# Patient Record
Sex: Female | Born: 1940 | Race: Black or African American | Hispanic: No | Smoking: Never smoker
Health system: Southern US, Community
[De-identification: ages and names within clinical notes are randomized; demographics above are authoritative.]

## PROBLEM LIST (undated history)

## (undated) DIAGNOSIS — E611 Iron deficiency: Secondary | ICD-10-CM

## (undated) DIAGNOSIS — D649 Anemia, unspecified: Secondary | ICD-10-CM

## (undated) DIAGNOSIS — K8020 Calculus of gallbladder without cholecystitis without obstruction: Secondary | ICD-10-CM

## (undated) DIAGNOSIS — I10 Essential (primary) hypertension: Secondary | ICD-10-CM

## (undated) DIAGNOSIS — N390 Urinary tract infection, site not specified: Secondary | ICD-10-CM

## (undated) DIAGNOSIS — I878 Other specified disorders of veins: Secondary | ICD-10-CM

## (undated) DIAGNOSIS — E785 Hyperlipidemia, unspecified: Secondary | ICD-10-CM

## (undated) DIAGNOSIS — I2510 Atherosclerotic heart disease of native coronary artery without angina pectoris: Secondary | ICD-10-CM

## (undated) DIAGNOSIS — N182 Chronic kidney disease, stage 2 (mild): Secondary | ICD-10-CM

## (undated) DIAGNOSIS — Z87442 Personal history of urinary calculi: Secondary | ICD-10-CM

## (undated) DIAGNOSIS — M7500 Adhesive capsulitis of unspecified shoulder: Secondary | ICD-10-CM

## (undated) DIAGNOSIS — M797 Fibromyalgia: Secondary | ICD-10-CM

## (undated) DIAGNOSIS — M1990 Unspecified osteoarthritis, unspecified site: Secondary | ICD-10-CM

## (undated) DIAGNOSIS — D696 Thrombocytopenia, unspecified: Secondary | ICD-10-CM

## (undated) DIAGNOSIS — L97921 Non-pressure chronic ulcer of unspecified part of left lower leg limited to breakdown of skin: Secondary | ICD-10-CM

## (undated) HISTORY — DX: Thrombocytopenia, unspecified: D69.6

## (undated) HISTORY — DX: Other specified disorders of veins: I87.8

## (undated) HISTORY — DX: Personal history of urinary calculi: Z87.442

## (undated) HISTORY — PX: OTHER SURGICAL HISTORY: SHX169

## (undated) HISTORY — DX: Anemia, unspecified: D64.9

## (undated) HISTORY — DX: Iron deficiency: E61.1

## (undated) HISTORY — PX: LEG SURGERY: SHX1003

## (undated) HISTORY — DX: Calculus of gallbladder without cholecystitis without obstruction: K80.20

## (undated) HISTORY — DX: Atherosclerotic heart disease of native coronary artery without angina pectoris: I25.10

## (undated) HISTORY — DX: Non-pressure chronic ulcer of unspecified part of left lower leg limited to breakdown of skin: L97.921

## (undated) HISTORY — DX: Urinary tract infection, site not specified: N39.0

## (undated) HISTORY — DX: Hyperlipidemia, unspecified: E78.5

## (undated) HISTORY — DX: Fibromyalgia: M79.7

## (undated) HISTORY — PX: CARDIAC CATHETERIZATION: SHX172

## (undated) HISTORY — DX: Essential (primary) hypertension: I10

## (undated) HISTORY — DX: Unspecified osteoarthritis, unspecified site: M19.90

## (undated) HISTORY — PX: CERVICAL SPINE SURGERY: SHX589

## (undated) HISTORY — DX: Adhesive capsulitis of unspecified shoulder: M75.00

## (undated) HISTORY — PX: KIDNEY STONE SURGERY: SHX686

## (undated) HISTORY — DX: Chronic kidney disease, stage 2 (mild): N18.2

---

## 2005-05-29 ENCOUNTER — Ambulatory Visit: Payer: Self-pay | Admitting: Internal Medicine

## 2005-12-27 ENCOUNTER — Encounter: Payer: Self-pay | Admitting: Cardiovascular Disease

## 2006-08-27 ENCOUNTER — Ambulatory Visit: Payer: Self-pay | Admitting: Family Medicine

## 2007-02-07 ENCOUNTER — Encounter: Payer: Self-pay | Admitting: Cardiovascular Disease

## 2007-03-03 ENCOUNTER — Ambulatory Visit (HOSPITAL_COMMUNITY): Admission: RE | Admit: 2007-03-03 | Discharge: 2007-03-03 | Payer: Self-pay | Admitting: *Deleted

## 2008-02-03 ENCOUNTER — Ambulatory Visit: Payer: Self-pay | Admitting: Specialist

## 2008-12-28 ENCOUNTER — Encounter: Payer: Self-pay | Admitting: Cardiovascular Disease

## 2009-05-17 ENCOUNTER — Ambulatory Visit: Payer: Self-pay | Admitting: Cardiovascular Disease

## 2009-05-17 DIAGNOSIS — E785 Hyperlipidemia, unspecified: Secondary | ICD-10-CM | POA: Insufficient documentation

## 2009-05-17 DIAGNOSIS — I10 Essential (primary) hypertension: Secondary | ICD-10-CM | POA: Insufficient documentation

## 2009-05-17 DIAGNOSIS — I2510 Atherosclerotic heart disease of native coronary artery without angina pectoris: Secondary | ICD-10-CM | POA: Insufficient documentation

## 2009-05-17 DIAGNOSIS — R0989 Other specified symptoms and signs involving the circulatory and respiratory systems: Secondary | ICD-10-CM | POA: Insufficient documentation

## 2009-05-17 DIAGNOSIS — Z8679 Personal history of other diseases of the circulatory system: Secondary | ICD-10-CM | POA: Insufficient documentation

## 2009-07-12 ENCOUNTER — Encounter: Payer: Self-pay | Admitting: Cardiovascular Disease

## 2009-07-12 ENCOUNTER — Ambulatory Visit: Payer: Self-pay

## 2009-07-12 ENCOUNTER — Ambulatory Visit: Payer: Self-pay | Admitting: Internal Medicine

## 2009-07-12 LAB — CONVERTED CEMR LAB
ALT: 6 units/L
AST: 18 units/L
Albumin: 4.1 g/dL
Alkaline Phosphatase: 130 units/L
BUN: 21 mg/dL
CO2: 25 meq/L
Calcium: 9.3 mg/dL
Chloride: 100 meq/L
Cholesterol: 167 mg/dL
Creatinine, Ser: 1.19 mg/dL
GFR calc Af Amer: 54 mL/min
GFR calc non Af Amer: 47 mL/min
Glucose, Bld: 92 mg/dL
HDL: 69 mg/dL
LDL (calc): 85 mg/dL
Potassium: 3.9 meq/L
Sodium: 137 meq/L
Total Bilirubin: 0.4 mg/dL
Total Protein: 7.6 g/dL
Triglyceride fasting, serum: 65 mg/dL

## 2009-07-18 ENCOUNTER — Encounter: Payer: Self-pay | Admitting: Cardiovascular Disease

## 2009-10-28 ENCOUNTER — Ambulatory Visit: Payer: Self-pay | Admitting: Cardiovascular Disease

## 2009-11-15 ENCOUNTER — Ambulatory Visit: Payer: Self-pay | Admitting: Cardiovascular Disease

## 2009-11-21 LAB — CONVERTED CEMR LAB
ALT: 8 units/L (ref 0–35)
AST: 15 units/L (ref 0–37)
Albumin: 3.9 g/dL (ref 3.5–5.2)
Alkaline Phosphatase: 113 units/L (ref 39–117)
Bilirubin, Direct: 0.1 mg/dL (ref 0.0–0.3)
Cholesterol: 184 mg/dL (ref 0–200)
HDL: 62 mg/dL (ref >39)
Indirect Bilirubin: 0.4 mg/dL (ref 0.0–0.9)
LDL Cholesterol: 110 mg/dL — ABNORMAL HIGH (ref 0–99)
Total Bilirubin: 0.5 mg/dL (ref 0.3–1.2)
Total CHOL/HDL Ratio: 3
Total Protein: 7.2 g/dL (ref 6.0–8.3)
Triglycerides: 62 mg/dL (ref <150)
VLDL: 12 mg/dL (ref 0–40)

## 2009-12-10 ENCOUNTER — Emergency Department: Payer: Medicare Other | Admitting: Internal Medicine

## 2010-02-01 ENCOUNTER — Ambulatory Visit: Payer: Medicare Other | Admitting: Family Medicine

## 2010-02-21 ENCOUNTER — Ambulatory Visit: Payer: Medicare Other | Admitting: Family Medicine

## 2010-04-03 ENCOUNTER — Ambulatory Visit (HOSPITAL_COMMUNITY)
Admission: RE | Admit: 2010-04-03 | Discharge: 2010-04-03 | Payer: Self-pay | Source: Home / Self Care | Attending: Family Medicine | Admitting: Family Medicine

## 2010-04-10 LAB — CREATININE, SERUM
Creatinine, Ser: 1.28 mg/dL — ABNORMAL HIGH (ref 0.4–1.2)
GFR calc Af Amer: 50 mL/min — ABNORMAL LOW (ref >60)
GFR calc non Af Amer: 41 mL/min — ABNORMAL LOW (ref >60)

## 2010-04-25 NOTE — Assessment & Plan Note (Signed)
Summary: F6M/AMD   Visit Type:  Follow-up Referring Provider:  Mariah Milling Primary Provider:  Toy Cookey, MD  CC:  "Doing well"..  History of Present Illness: patient is a very pleasant 70 year old woman with history of coronary artery disease, stent placed to her mid LAD with repeat stenting for in-stent restenosis, hyperlipidemia, hypertension, fibromyalgia and diffuse muscle aches and arthritis Who presents for routine followup.  overall she has been doing very well. She is active, has no complaints of chest pain or shortness of breath. She is taking Crestor daily with no significant muscle ache. She tries to walk on a daily basis. she reports her blood pressure has been stable on losartan HCT.  Current Medications (verified): 1)  Losartan Potassium-Hctz 100-25 Mg Tabs (Losartan Potassium-Hctz) .Marland Kitchen.. 1 Tab By Mouth Daily 2)  Crestor 10 Mg Tabs (Rosuvastatin Calcium) .Marland Kitchen.. 1 At Bedtime 3)  Aspirin 81 Mg Tbec (Aspirin) .... Take 2 Tablets By Mouth Daily 4)  Ambien 10 Mg Tabs (Zolpidem Tartrate) .Marland Kitchen.. 1 By Mouth Once Daily  Allergies (verified): No Known Drug Allergies  Past History:  Past Medical History: Last updated: Jun 13, 2009 CAD Hypertension Hypelipidemia Arthritis Stents placed in mid LAD restented in-stent restensis  Past Surgical History: Last updated: 06/13/2009 Frozen shoulder Kidney stones bladder stones stents  Family History: Last updated: 06-13-09 Father: deceased 58: MI Mother: deceased 68: MI, HTN Sister: deceased 36: Trauma to head Brother: living: CABG, kidney problems, DM Son: deceased 7: burns  Social History: Last updated: 06-13-09 Retired  Married  Tobacco Use - No.  Alcohol Use - no Regular Exercise - yes - occassional Drug Use - no  Risk Factors: Alcohol Use: 0 (06-13-09) Caffeine Use: 4 cups a day (06/13/2009) Exercise: yes (2009-06-13)  Risk Factors: Smoking Status: never (2009/06/13)  Review of Systems  The patient denies  fever, weight loss, weight gain, vision loss, decreased hearing, hoarseness, chest pain, syncope, dyspnea on exertion, peripheral edema, prolonged cough, abdominal pain, incontinence, muscle weakness, depression, and enlarged lymph nodes.    Vital Signs:  Patient profile:   70 year old female Height:      67 inches Weight:      201 pounds BMI:     31.59 Pulse rate:   58 / minute BP sitting:   116 / 75  (left arm) Cuff size:   large  Vitals Entered By: Bishop Dublin, CMA (October 28, 2009 10:33 AM)  Physical Exam  General:  well-appearing woman in no apparent distress, alert and oriented x3, H. EENT exam is benign, neck is supple with no JVP, 2+ carotid bruit on the right, heart sounds are regular with normal S1-S2 and no murmurs appreciated, lungs are clear to auscultation with no wheezes rales, or abdominal exam is benign, she has no significant lower extremity edema, neurologic exam is grossly nonfocal, skin is warm and dry. Pulses are equal and symmetrical in her upper and lower extremities.   Impression & Recommendations:  Problem # 1:  CAD (ICD-414.00) stent placement in her LAD with repeat stent placement for in-stent restenosis. No recurrent symptoms of shortness of breath. no testing indicated at this time as she is asymptomatic.  Her updated medication list for this problem includes:    Aspirin 81 Mg Tbec (Aspirin) .Marland Kitchen... Take 2 tablets by mouth daily  Orders: EKG w/ Interpretation (93000) T-Lipid Profile ([id number]) T-Hepatic Function ([id number])  Problem # 2:  HYPERLIPIDEMIA (ICD-272.4) We will check her lipid panel as this has not been known for some time on her  current dose of Crestor. Goal LDL is less than 70.  Her updated medication list for this problem includes:    Crestor 10 Mg Tabs (Rosuvastatin calcium) .Marland Kitchen... 1 at bedtime  Orders: T-Lipid Profile [phone]) T-Hepatic Function [phone])  Problem # 3:  ESSENTIAL HYPERTENSION, BENIGN  (ICD-401.1) Blood pressure is well-controlled on her current medication regimen. We have made no changes at this time.  Her updated medication list for this problem includes:    Losartan Potassium-hctz 100-25 Mg Tabs (Losartan potassium-hctz) .Marland Kitchen... 1 tab by mouth daily    Aspirin 81 Mg Tbec (Aspirin) .Marland Kitchen... Take 2 tablets by mouth daily  Appended Document: F6M/AMD She does have moderate to severe bilateral carotid arterial disease. We will continue to be aggressive with her lipid management.

## 2010-04-25 NOTE — Assessment & Plan Note (Signed)
Summary: NP6/AMD   Visit Type:  New Pateint Referring Provider:  Mariah Milling Primary Provider:  Toy Cookey, MD  CC:  no cp, sob with exertion, and edema in ankles and feet.  History of Present Illness: patient is a very pleasant 70 year old woman with history of coronary artery disease, stent placed to her mid LAD with repeat stenting for in-stent restenosis, hyperlipidemia, hypertension, fibromyalgia and diffuse muscle aches and arthritis who presents to reestablish care.the details of her cardiac catheterizations are unavailable to [place] at this time.  She states that she has been doing well, active. She does have some muscle ache in her neck sometimes when she leans back in her chair at nighttime. She has been taking Crestor 20 mg on an as needed basis of those she feels that the 20 mg hurts her legs. She's not been doing any exercise routine but states that she does all her ADLs and has no problems with shortness of breath or chest pain. She's also been cutting her Diovan HCT in half as she feels it causes leg swelling.  Preventive Screening-Counseling & Management  Alcohol-Tobacco     Alcohol drinks/day: 0     Smoking Status: never  Caffeine-Diet-Exercise     Caffeine use/day: 4 cups a day     Does Patient Exercise: yes      Drug Use:  no.    Current Problems (verified): 1)  Hyperlipidemia  (ICD-272.4) 2)  Essential Hypertension, Benign  (ICD-401.1) 3)  Cad  (ICD-414.00)  Current Medications (verified): 1)  Diovan Hct 320-25 Mg Tabs (Valsartan-Hydrochlorothiazide) .Marland Kitchen.. 1 By Mouth Once Daily 2)  Crestor 20 Mg Tabs (Rosuvastatin Calcium) .... Take One Tablet By Mouth Every Other Day - Maybe 3)  Aspirin 81 Mg Tbec (Aspirin) .... Take One Tablet By Mouth Daily 4)  Ambien 10 Mg Tabs (Zolpidem Tartrate) .Marland Kitchen.. 1 By Mouth Once Daily  Allergies (verified): No Known Drug Allergies  Past History:  Past Medical History: CAD Hypertension Hypelipidemia Arthritis Stents placed in mid  LAD restented in-stent restensis  Past Surgical History: Frozen shoulder Kidney stones bladder stones stents  Family History: Father: deceased 45: MI Mother: deceased 80: MI, HTN Sister: deceased 42: Trauma to head Brother: living: CABG, kidney problems, DM Son: deceased 23: burns  Social History: Retired  Married  Tobacco Use - No.  Alcohol Use - no Regular Exercise - yes - occassional Drug Use - no Alcohol drinks/day:  0 Smoking Status:  never Caffeine use/day:  4 cups a day Does Patient Exercise:  yes Drug Use:  no  Review of Systems  The patient denies anorexia, fever, weight loss, weight gain, vision loss, decreased hearing, hoarseness, chest pain, syncope, dyspnea on exertion, peripheral edema, prolonged cough, headaches, hemoptysis, abdominal pain, melena, hematochezia, severe indigestion/heartburn, hematuria, incontinence, genital sores, muscle weakness, suspicious skin lesions, transient blindness, difficulty walking, depression, unusual weight change, abnormal bleeding, enlarged lymph nodes, angioedema, breast masses, and testicular masses.         Neck discomfort  Vital Signs:  Patient profile:   70 year old female Height:      67 inches Weight:      208 pounds BMI:     32.70 Pulse rate:   71 / minute Pulse rhythm:   regular BP sitting:   140 / 64  (left arm) Cuff size:   large  Vitals Entered By: Mercer Pod (May 17, 2009 10:22 AM)  Physical Exam  General:  well-appearing woman in no apparent distress, alert and oriented x3,  H. EENT exam is benign, neck is supple with no JVP, 2+ carotid bruit on the right, heart sounds are regular with normal S1-S2 and no murmurs appreciated, lungs are clear to auscultation with no wheezes rales, or abdominal exam is benign, she has no significant lower extremity edema, neurologic exam is grossly nonfocal, skin is warm and dry. Pulses are equal and symmetrical in her upper and lower  extremities.   EKG  Procedure date:  05/17/2009  Findings:      Heart rate is 71 beats per minute, normal sinus rhythm with frequent APCs.  Impression & Recommendations:  Problem # 1:  CAD (ICD-414.00) history of coronary disease and stenting to her mid LAD with repeat stenting for in-stent restenosis. We'll try to obtain her catheterization reportsfor our records. We'll try to obtain her most recent cholesterol panel. She states having a stress test in 2009. We'll try to obtain this prior record. we have suggested that she increase her aspirin to 81 mg x2 daily. Her updated medication list for this problem includes:    Aspirin 81 Mg Tbec (Aspirin) .Marland Kitchen... Take 2 tablets by mouth daily  Problem # 2:  HYPERLIPIDEMIA (ICD-272.4) She is taking her Crestor intermittently we have suggested that she take it on a daily basis. She will try to decrease the dose we have suggested she take 10 mg daily and we'll recheck her cholesterol in 2-3 months. Her updated medication list for this problem includes:    Crestor 20 Mg Tabs (Rosuvastatin calcium) .Marland Kitchen... Take 1/2 tablet by mouth every day  Problem # 3:  ESSENTIAL HYPERTENSION, BENIGN (ICD-401.1) blood pressure is borderline elevated today. She states taking only half of her Diovan HCT pill. Due to cost, we will change her to losartan HCT 100/25 mg daily and have suggested she take a whole tablet. Her updated medication list for this problem includes:    Losartan Potassium-hctz 100-25 Mg Tabs (Losartan potassium-hctz) .Marland Kitchen... 1 tab by mouth daily    Aspirin 81 Mg Tbec (Aspirin) .Marland Kitchen... 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 We have ordered a carotid ultrasound for her to do at her convenience to reevaluate her stenosis. There is clearly a bruit/murmur heard on exam.  Her  updated medication list for this problem includes:    Aspirin 81 Mg Tbec (Aspirin) .Marland Kitchen... Take 2 tablets by mouth daily   Patient Instructions: 1)  Your physician recommends that you return for a FASTING lipid profile: in 2-3 months (cholesterol, lfts) 2)  Your physician has recommended you make the following change in your medication: increase aspirin to 2-81 mg daily, crestor 10 mg daily, change diovan-hct to losartan-hct 100-25mg  daily 3)  Your physician has requested that you have a carotid duplex. This test is an ultrasound of the carotid arteries in your neck. It looks at blood flow through these arteries that supply the brain with blood. Allow one hour for this exam. There are no restrictions or special instructions. Prescriptions: LOSARTAN POTASSIUM-HCTZ 100-25 MG TABS (LOSARTAN POTASSIUM-HCTZ) 1 tab by mouth daily  #30 x 6   Entered by:   Charlena Cross, RN, BSN   Authorized by:   Dossie Arbour MD   Signed by:   Charlena Cross, RN, BSN on 05/17/2009   Method used:   Electronically to        [facility] [phone]* (retail)       [address] [place], [place]  [address]       Ph: [phone]       Fax: [phone]   RxID:   [id number]

## 2010-04-25 NOTE — Letter (Signed)
Summary: Medical Record Release  Medical Record Release   Imported By: Harlon Flor 05/18/2009 10:13:47  _____________________________________________________________________  External Attachment:    Type:   Image     Comment:   External Document

## 2010-04-25 NOTE — Progress Notes (Signed)
Summary: PHI  PHI   Imported By: Harlon Flor 05/18/2009 10:14:03  _____________________________________________________________________  External Attachment:    Type:   Image     Comment:   External Document

## 2010-05-16 ENCOUNTER — Telehealth: Payer: Self-pay | Admitting: Cardiovascular Disease

## 2010-05-23 NOTE — Progress Notes (Signed)
Summary: RX  Phone Note Refill Request Call back at Home Phone [phone] Message from:  Patient on May 16, 2010 12:39 PM  Refills Requested: Medication #1:  LOSARTAN POTASSIUM-HCTZ 100-25 MG TABS 1 tab by mouth daily [facility] on [address]  Initial call taken by: Harlon Flor,  May 16, 2010 12:40 PM    Prescriptions: LOSARTAN POTASSIUM-HCTZ 100-25 MG TABS (LOSARTAN POTASSIUM-HCTZ) 1 tab by mouth daily  #30 x 6   Entered by:   Lysbeth Galas CMA   Authorized by:   Dossie Arbour MD   Signed by:   Lysbeth Galas CMA on 05/16/2010   Method used:   Electronically to        [facility] [phone]* (retail)       [address] [place], [place]  [address]       Ph: [phone]       Fax: [phone]   RxID:   [id number]

## 2010-06-07 ENCOUNTER — Telehealth: Payer: Self-pay | Admitting: Cardiovascular Disease

## 2010-06-13 NOTE — Progress Notes (Signed)
Summary: Heart racing and choking feeling, FYI  Phone Note Call from Patient Call back at Home Phone [phone]   Caller: Self Call For: Jessel Gettinger Summary of Call: Pt is feeling like she is choking and heart races at night.  I scheduled her to be seen 3/28.  This was our next available appt and the pt was not satisfied with this.  She mentioned that she could see her PCD or go to the ED if it gets worse.  I stated that would be a good plan.  She then stated that she thinks she should not have to wait 2 weeks to see her cardiologist.  She noted that she discussed these problems in the ED with [facility] and attributed them to her brother being in Hospice. Initial call taken by: Harlon Flor,  June 07, 2010 8:54 AM  Follow-up for Phone Call        Spoke to pt, she states that she has been having these symptoms (palpitations/ choking feeling at night) for about 1 week. Pt told me that she does believe it is stress, related to her brother being in hospice and "waiting on a call any minute about him passing." Pt denies any SOB. She also denies having any problems with tachycardia/ arrhythmias in the past. Pt does not have a BP cuff at home to checek HR and BP. Advised pt that I think she should keep appt with Dr. Mariah Milling 3/28, however, I do think she should see her PMD to r/o any other causes of symptoms, possibly anxiety related. Pt agrees to this, and she will contact our office if symptoms worsen or any changes occur in the meantime. Follow-up by: Lanny Hurst RN,  June 07, 2010 11:28 AM

## 2010-06-21 ENCOUNTER — Ambulatory Visit: Payer: Self-pay | Admitting: Cardiovascular Disease

## 2010-07-10 ENCOUNTER — Telehealth: Payer: Self-pay | Admitting: *Deleted

## 2010-07-10 NOTE — Telephone Encounter (Signed)
Pt came by office this AM c/o atypical chest discomfort, radiating to right shoulder area, also incr in "burping" and usually when she lies down. This has been going on for about 4 weeks. Pt also states her brother recently passed away and she has been under great amount of stress. Pt does have h/o CAD with stent in mid LAD and re-stenting after restenosis >15yrs ago in [place].  Pt denies h/o Genella Rife, she does not take any antacids or anything for stress. Pt was added on for appt with Dr. Mariah Milling for wed this week. Notified pt to contact PCP as she may need eval for stress as well, advised to try Prilosec OTC for the "burping" and "full sensation." Pt will call us in the meantime if her symptoms change or worsen or if SOB develops. Called pt to see if she wanted to go ahead and order stress test, if she thinks this is definitely not caused by any other factors. Pt states she would like to contact her PCP first, and she will let us know if she wants stress test ordered.

## 2010-07-12 ENCOUNTER — Encounter: Payer: Self-pay | Admitting: Cardiovascular Disease

## 2010-07-12 NOTE — Progress Notes (Signed)
   Patient ID: [name] Cooper, female    DOB: [birth year], 70 y.o.   MRN: [id number]  HPI    Review of Systems    Physical Exam

## 2010-07-14 ENCOUNTER — Encounter: Payer: Self-pay | Admitting: Cardiovascular Disease

## 2010-07-24 ENCOUNTER — Ambulatory Visit (INDEPENDENT_AMBULATORY_CARE_PROVIDER_SITE_OTHER): Payer: Medicare PPO | Admitting: Cardiovascular Disease

## 2010-07-24 ENCOUNTER — Encounter: Payer: Self-pay | Admitting: Cardiovascular Disease

## 2010-07-24 DIAGNOSIS — E785 Hyperlipidemia, unspecified: Secondary | ICD-10-CM

## 2010-07-24 DIAGNOSIS — I2510 Atherosclerotic heart disease of native coronary artery without angina pectoris: Secondary | ICD-10-CM

## 2010-07-24 DIAGNOSIS — Z8679 Personal history of other diseases of the circulatory system: Secondary | ICD-10-CM

## 2010-07-24 DIAGNOSIS — R079 Chest pain, unspecified: Secondary | ICD-10-CM

## 2010-07-24 DIAGNOSIS — I10 Essential (primary) hypertension: Secondary | ICD-10-CM

## 2010-07-24 LAB — LIPID PANEL
Cholesterol: 209 mg/dL — ABNORMAL HIGH (ref 0–200)
HDL: 57 mg/dL (ref >39)
LDL Cholesterol: 134 mg/dL — ABNORMAL HIGH (ref 0–99)
Total CHOL/HDL Ratio: 3.7 Ratio
Triglycerides: 90 mg/dL (ref <150)
VLDL: 18 mg/dL (ref 0–40)

## 2010-07-24 NOTE — Assessment & Plan Note (Signed)
Chest pain is somewhat atypical. She does have paravertebral muscle spasms Likely secondary to her underlying stress. She does have a loss of her brother, husband with dementia. I suggested she talk with Dr. Burnett Sheng to see if she would be a candidate for muscle relaxants or even low dose benzos for significant stress when she cannot sleep. We will hold off on any workup at this time. I've asked her to start exercising.

## 2010-07-24 NOTE — Patient Instructions (Addendum)
You are doing well. No medication changes were made. Please call us if you have new issues that need to be addressed before your next appt.  We will send you a letter with your lab results that were drawn today. We will call you for a follow up Appt. In 6 months  

## 2010-07-24 NOTE — Assessment & Plan Note (Signed)
Continue her current medications as she is taking.

## 2010-07-24 NOTE — Assessment & Plan Note (Signed)
Blood pressure is well controlled on today's visit. No changes made to the medications. 

## 2010-07-24 NOTE — Progress Notes (Signed)
   Patient ID: [name] Cooper, female    DOB: [birth year], 70 y.o.   MRN: [id number]  HPI Comments: [name] Cooper is a very pleasant 69 year old woman with history of coronary artery disease, stent placed to her mid LAD with repeat stenting for in-stent restenosis, hyperlipidemia, hypertension, fibromyalgia and diffuse muscle aches and arthritis who presents for routine followup.   She reports that she has had significant stress at home. Her brother recently died while in hospice. She spent significant time in the hospital for this. Her husband also has significant dementia and she has been having increasing stress at home taking care of him.  She reports having significant tightness across her back, the neck into the shoulders. It is very uncomfortable. She is uncertain if this is from her heart. She occasionally has some tightness in the chest though mainly in the back.  She has not been walking on a regular basis as she was in the past. She does feel somewhat trapped as she cannot leave her husband alone for long periods. She is nervous about his driving.  EKG shows normal sinus rhythm with rate 60 beats per minute with no significant ST or T wave changes      Review of Systems  Constitutional: Positive for activity change and fatigue.  HENT: Negative.   Eyes: Negative.   Respiratory: Positive for shortness of breath.   Cardiovascular: Positive for chest pain.  Gastrointestinal: Negative.   Musculoskeletal: Positive for arthralgias.       Tight muscles in the back of her neck and shoulders.  Skin: Negative.   Neurological: Negative.   Hematological: Negative.   Psychiatric/Behavioral: Negative.   All other systems reviewed and are negative.   BP 110/60  Pulse 60  Resp 16  Ht 5\' 7"  (1.702 m)  Wt 193 lb (87.544 kg)  BMI 30.23 kg/m2  Physical Exam  Nursing note and vitals reviewed. Constitutional: She is oriented to person, place, and time. She appears well-developed and  well-nourished.  HENT:  Head: Normocephalic.  Nose: Nose normal.  Mouth/Throat: Oropharynx is clear and moist.  Eyes: Conjunctivae are normal. Pupils are equal, round, and reactive to light.  Neck: Normal range of motion. Neck supple. No JVD present.  Cardiovascular: Normal rate, regular rhythm, S1 normal, S2 normal, normal heart sounds and intact distal pulses.  Exam reveals no gallop and no friction rub.   No murmur heard. Pulmonary/Chest: Effort normal and breath sounds normal. No respiratory distress. She has no wheezes. She has no rales. She exhibits no tenderness.  Abdominal: Soft. Bowel sounds are normal. She exhibits no distension. There is no tenderness.  Musculoskeletal: Normal range of motion. She exhibits no edema and no tenderness.  Lymphadenopathy:    She has no cervical adenopathy.  Neurological: She is alert and oriented to person, place, and time. Coordination normal.  Skin: Skin is warm and dry. No rash noted. No erythema.  Psychiatric: She has a normal mood and affect. Her behavior is normal. Judgment and thought content normal.         Assessment and Plan

## 2010-07-26 ENCOUNTER — Encounter: Payer: Self-pay | Admitting: Cardiovascular Disease

## 2010-08-08 NOTE — Cardiovascular Report (Signed)
NAMESHAYLA, HEMING NO.:  [id number]   MEDICAL RECORD NO.:  [id number]          PATIENT TYPE:  OIB   LOCATION:  2899                         FACILITY:  [facility]   PHYSICIAN:  Darlin Priestly, MD  DATE OF BIRTH:  08-28-1940   DATE OF PROCEDURE:  03/03/2007  DATE OF DISCHARGE:                            CARDIAC CATHETERIZATION   PROCEDURE:  1. Left heart catheterization.  2. Coronary angiography.  3. Left ventriculogram.   COMPLICATIONS:  None.   INDICATIONS:  Ms. Dandy is a 70 year old female a patient of Dr.  Toy Cookey and Dr. Kem Boroughs with a history of hypertension,  hyperlipidemia, history of known CAD, status post overlapping LAD stents  in 2000 with subsequent InStent restenosis and placement of a third non-  DES stent in the mid segment of the overlapping 2 previous LAD stents.  Since that time she has had negative Cardiolite scans and  echocardiograms.  Recently, however, she did complain of some increasing  chest pain, and underwent repeat Cardiolite scan suggesting a mild  septal ischemia.  She is now for repeat catheterization to reassess her  CAD.   DESCRIPTION OF PROCEDURE:  After patient giving informed consent, the  patient was brought to the cardiac cath lab and the right groin was  shaved, prepped and draped in the usual sterile fashion.  Usual  monitoring established.  Using modified Seldinger technique, a #6-French  arterial sheath was inserted into the right femoral artery.  A 6-French  diagnostic catheter was used to perform diagnostic angiography.   RESULTS:  1. Left main is a medium-to-large vessel with no significant disease.  2. The LAD is a medium-size vessel which courses __________ to one      diagonal branch.  There are overlapping stents noted in the      proximal portion of the LAD with diffuse 40% InStent restenosis      which appears unchanged from catheterization done in 2003.  There      is a 50% lesion in the  LAD beyond the stents at the takeoff of the      first diagonal.  3. First diagonal was a small vessel with no disease.  4. Left coronary artery also gives rise to a medium-size ramus      intermedius with 40% ostial lesion which, again, was unchanged.  5. Left circumflex is a medium-size vessel giving rise to two obtuse      marginal branches.  The AV circumflex has no significant disease.  6. First OM is a medium-size vessel which bifurcates into the      __________ segment and no significant disease.  7. Second OM is a small vessel with no significant disease.  8. The right coronary artery is a medium-size vessel which is dominant      and gives rise to with PDS of the first lateral branch.  There is      spasm noted in the proximal portion of the RCA which is relieved      with intracoronary nitro.  There is no further significant disease      in  the RCA, PDA, or posterolateral branch.   LEFT VENTRICULOGRAM:  Reveals a preserved EF of 60%.   HEMODYNAMIC SYSTEM:  Systemic arterial pressure 117/44, LV system  pressure 17/3, LVEDP of 15.   CONCLUSION:  1. Noncritical coronary artery disease involving InStent restenosis of      the left anterior descending artery which appears unchanged from      catheterization in 2003.  2. Normal left ventricular systolic function.      Darlin Priestly, MD  Electronically Signed     RHM/MEDQ  D:  03/03/2007  T:  03/03/2007  Job:  [id number]   cc:   Dr. Lalla Brothers, MD

## 2010-12-19 ENCOUNTER — Telehealth: Payer: Self-pay

## 2010-12-19 MED ORDER — LOSARTAN POTASSIUM-HCTZ 100-25 MG PO TABS: 1.0000 | ORAL_TABLET | Freq: Every day | ORAL | Status: DC

## 2010-12-19 NOTE — Telephone Encounter (Signed)
Duplicate message for losartan potassium

## 2010-12-19 NOTE — Telephone Encounter (Signed)
Refill sent for losartan-hctz 100-25 mg take one tablet daily.

## 2011-01-12 ENCOUNTER — Encounter: Payer: Self-pay | Admitting: Cardiovascular Disease

## 2011-01-22 ENCOUNTER — Encounter: Payer: Self-pay | Admitting: Cardiovascular Disease

## 2011-01-22 ENCOUNTER — Ambulatory Visit (INDEPENDENT_AMBULATORY_CARE_PROVIDER_SITE_OTHER): Payer: Medicare PPO | Admitting: Cardiovascular Disease

## 2011-01-22 DIAGNOSIS — I6529 Occlusion and stenosis of unspecified carotid artery: Secondary | ICD-10-CM

## 2011-01-22 DIAGNOSIS — I10 Essential (primary) hypertension: Secondary | ICD-10-CM

## 2011-01-22 DIAGNOSIS — Z01810 Encounter for preprocedural cardiovascular examination: Secondary | ICD-10-CM

## 2011-01-22 DIAGNOSIS — E785 Hyperlipidemia, unspecified: Secondary | ICD-10-CM

## 2011-01-22 DIAGNOSIS — I2510 Atherosclerotic heart disease of native coronary artery without angina pectoris: Secondary | ICD-10-CM

## 2011-01-22 DIAGNOSIS — R079 Chest pain, unspecified: Secondary | ICD-10-CM

## 2011-01-22 MED ORDER — NITROGLYCERIN 0.4 MG SL SUBL: 0.4000 mg | SUBLINGUAL_TABLET | SUBLINGUAL | Status: DC | PRN

## 2011-01-22 NOTE — Assessment & Plan Note (Addendum)
We will need to make some adjustments to her cholesterol medication. We will have the nurses confirmed that she is actually taking Crestor daily. In the past, she was not taking this every day. If this is the case, we could add zetia.

## 2011-01-22 NOTE — Patient Instructions (Addendum)
You are doing well. No medication changes were made.   We will schedule you for a cardiac cath this week.  Please call us if you have new issues that need to be addressed before your next appt.  The office will contact you for a follow up Appt. In 1 months  Your physician has requested that you have a cardiac catheterization. Cardiac catheterization is used to diagnose and/or treat various heart conditions. Doctors may recommend this procedure for a number of different reasons. The most common reason is to evaluate chest pain. Chest pain can be a symptom of coronary artery disease (CAD), and cardiac catheterization can show whether plaque is narrowing or blocking your heart's arteries. This procedure is also used to evaluate the valves, as well as measure the blood flow and oxygen levels in different parts of your heart. For further information please visit [email or web address] Please follow instruction sheet, as given.

## 2011-01-22 NOTE — Assessment & Plan Note (Addendum)
I'm concerned about recent worsening chest pain with exertion. She reports it is similar to previous symptoms when she had her stent stenosis. We have discussed the various options for evaluation including stress testing or cardiac catheterization. She and her daughter prefer a catheterization given that she has had problems in the past with stent and in-stent restenosis. We'll try to schedule this for a [facility] late this week.

## 2011-01-22 NOTE — Assessment & Plan Note (Signed)
Blood pressure is well controlled on today's visit. No changes made to the medications. 

## 2011-01-22 NOTE — Assessment & Plan Note (Signed)
Previous ultrasound April 2011 suggested 60-79% bilateral carotid arterial disease. We will talk to her about scheduling a repeat carotid ultrasound.

## 2011-01-22 NOTE — Progress Notes (Signed)
   Patient ID: [name] Cooper, female    DOB: 04-Aug-1940, 70 y.o.   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   She has had very stressful family events in the past as her brother has died, husband had significant dementia. She reports having significant chest tightness with exertion. It seems to come on with walking, stairs, any exertion. She is concerned it could be her heart as it feels very similar to previous symptoms. In the past, she has had back discomfort, now she reports having predominantly mediastinal and left sided chest discomfort, sometimes radiating to her left arm with exertion.   EKG shows normal sinus rhythm with rate 61 beats per minute with no significant ST or T wave changes, Unable to exclude old anteroseptal infarct    Outpatient Encounter Prescriptions as of 01/22/2011  Medication Sig Dispense Refill  . aspirin 81 MG EC tablet Take 81 mg by mouth daily.       Marland Kitchen losartan-hydrochlorothiazide (HYZAAR) 100-25 MG per tablet Take 1 tablet by mouth daily.  30 tablet  6  . rosuvastatin (CRESTOR) 20 MG tablet Take 10 mg by mouth daily.       Marland Kitchen zolpidem (AMBIEN) 10 MG tablet Take 10 mg by mouth at bedtime as needed.        . nitroGLYCERIN (NITROSTAT) 0.4 MG SL tablet Place 1 tablet (0.4 mg total) under the tongue every 5 (five) minutes as needed for chest pain.  25 tablet  3     Review of Systems  Constitutional: Positive for fatigue.  HENT: Negative.   Eyes: Negative.   Cardiovascular: Positive for chest pain.  Gastrointestinal: Negative.   Musculoskeletal: Positive for arthralgias.  Skin: Negative.   Neurological: Negative.   Hematological: Negative.   Psychiatric/Behavioral: Negative.     All other systems reviewed and are negative.   BP 124/60  Pulse 65  Ht 5\' 7"  (1.702 m)  Wt 197 lb 8 oz (89.585 kg)  BMI 30.93 kg/m2  Physical Exam  Nursing note and vitals reviewed. Constitutional: She is oriented to person, place, and time. She appears well-developed and well-nourished.  HENT:  Head: Normocephalic.  Nose: Nose normal.  Mouth/Throat: Oropharynx is clear and moist.  Eyes: Conjunctivae are normal. Pupils are equal, round, and reactive to light.  Neck: Normal range of motion. Neck supple. No JVD present.  Cardiovascular: Normal rate, regular rhythm, S1 normal, S2 normal, normal heart sounds and intact distal pulses.  Exam reveals no gallop and no friction rub.   No murmur heard. Pulmonary/Chest: Effort normal and breath sounds normal. No respiratory distress. She has no wheezes. She has no rales. She exhibits no tenderness.  Abdominal: Soft. Bowel sounds are normal. She exhibits no distension. There is no tenderness.  Musculoskeletal: Normal range of motion. She exhibits no edema and no tenderness.  Lymphadenopathy:    She has no cervical adenopathy.  Neurological: She is alert and oriented to person, place, and time. Coordination normal.  Skin: Skin is warm and dry. No rash noted. No erythema.  Psychiatric: She has a normal mood and affect. Her behavior is normal. Judgment and thought content normal.         Assessment and Plan

## 2011-01-23 ENCOUNTER — Encounter: Payer: Self-pay | Admitting: *Deleted

## 2011-01-23 ENCOUNTER — Telehealth: Payer: Self-pay | Admitting: *Deleted

## 2011-01-23 DIAGNOSIS — E876 Hypokalemia: Secondary | ICD-10-CM

## 2011-01-23 DIAGNOSIS — I6529 Occlusion and stenosis of unspecified carotid artery: Secondary | ICD-10-CM

## 2011-01-23 DIAGNOSIS — E785 Hyperlipidemia, unspecified: Secondary | ICD-10-CM

## 2011-01-23 DIAGNOSIS — I2510 Atherosclerotic heart disease of native coronary artery without angina pectoris: Secondary | ICD-10-CM

## 2011-01-23 DIAGNOSIS — R0989 Other specified symptoms and signs involving the circulatory and respiratory systems: Secondary | ICD-10-CM

## 2011-01-23 LAB — BASIC METABOLIC PANEL
BUN/Creatinine Ratio: 16 (ref 11–26)
BUN: 21 mg/dL (ref 8–27)
CO2: 23 mmol/L (ref 20–32)
Calcium: 9.3 mg/dL (ref 8.6–10.2)
Chloride: 102 mmol/L (ref 97–108)
Creatinine, Ser: 1.29 mg/dL — ABNORMAL HIGH (ref 0.57–1.00)
GFR calc Af Amer: 48 mL/min/{1.73_m2} — ABNORMAL LOW (ref >59)
GFR calc non Af Amer: 42 mL/min/{1.73_m2} — ABNORMAL LOW (ref >59)
Glucose: 100 mg/dL — ABNORMAL HIGH (ref 65–99)
Potassium: 3.4 mmol/L — ABNORMAL LOW (ref 3.5–5.2)
Sodium: 139 mmol/L (ref 134–144)

## 2011-01-23 LAB — PROTIME-INR
INR: 1.1 (ref 0.8–1.2)
Prothrombin Time: 11.3 s (ref 9.1–12.0)

## 2011-01-23 LAB — CBC WITH DIFFERENTIAL
Basophils Absolute: 0 10*3/uL (ref 0.0–0.2)
Basos: 0 % (ref 0–3)
Eos: 4 % (ref 0–7)
Eosinophils Absolute: 0.4 10*3/uL (ref 0.0–0.4)
HCT: 32.2 % — ABNORMAL LOW (ref 34.0–46.6)
Hemoglobin: 10.5 g/dL — ABNORMAL LOW (ref 11.1–15.9)
Immature Grans (Abs): 0 10*3/uL (ref 0.0–0.1)
Immature Granulocytes: 0 % (ref 0–2)
Lymphocytes Absolute: 2.5 10*3/uL (ref 0.7–4.5)
Lymphs: 26 % (ref 14–46)
MCH: 26.8 pg (ref 26.6–33.0)
MCHC: 32.6 g/dL (ref 31.5–35.7)
MCV: 82 fL (ref 79–97)
Monocytes Absolute: 0.8 10*3/uL (ref 0.1–1.0)
Monocytes: 8 % (ref 4–13)
Neutrophils Absolute: 6.2 10*3/uL (ref 1.8–7.8)
Neutrophils Relative %: 62 % (ref 40–74)
Platelets: 347 10*3/uL (ref 140–415)
RBC: 3.92 x10E6/uL (ref 3.77–5.28)
RDW: 12.6 % (ref 12.3–15.4)
WBC: 9.9 10*3/uL (ref 4.0–10.5)

## 2011-01-23 NOTE — Telephone Encounter (Signed)
Spoke to pt, gave her instructions for cath (per letter sent) this Friday 11/2, she will arrive at 0700 at [facility], and she knows to pre-register. Gave pt instructions over the phone and she knows letter will be sent as well. She verbalizes understanding of all instructions listed. Pt's labs reviewed with pt, K+ low at 3.4, she does take hctz 25, I advised her to incr K in diet daily; I reviewed many K+ rich foods for this. Pt states she has had problems with low K in past. Dr. Mariah Milling, I also asked pt if she is taking Crestor 20 daily since her lipids still very high. Pt states she had leg/muscle cramps with 20, and she has cut down to 10mg  QOD. She also says with her h/o low K, this has been the cause of cramps in the past as well. Pt has f/u with you on 11/29 post cath, do you want [place] to re-check BMP then and r/o this as the cause prior to changing meds? She says after decr to 10mg  qod that her cramps have not changed much. Do you want me to have her atleast take daily in the meantime. Please advise.  Will also schedule pt for future carotid prior to her f/u with [facility].

## 2011-01-26 ENCOUNTER — Encounter: Payer: Self-pay | Admitting: Cardiovascular Disease

## 2011-01-26 ENCOUNTER — Ambulatory Visit: Payer: Medicare Other | Admitting: Cardiovascular Disease

## 2011-01-26 DIAGNOSIS — R072 Precordial pain: Secondary | ICD-10-CM

## 2011-02-05 ENCOUNTER — Ambulatory Visit (INDEPENDENT_AMBULATORY_CARE_PROVIDER_SITE_OTHER): Payer: Medicare PPO | Admitting: *Deleted

## 2011-02-05 ENCOUNTER — Encounter: Payer: Self-pay | Admitting: Cardiovascular Disease

## 2011-02-05 ENCOUNTER — Ambulatory Visit (INDEPENDENT_AMBULATORY_CARE_PROVIDER_SITE_OTHER): Payer: Medicare PPO | Admitting: Cardiovascular Disease

## 2011-02-05 DIAGNOSIS — I6529 Occlusion and stenosis of unspecified carotid artery: Secondary | ICD-10-CM

## 2011-02-05 DIAGNOSIS — E876 Hypokalemia: Secondary | ICD-10-CM

## 2011-02-05 DIAGNOSIS — I10 Essential (primary) hypertension: Secondary | ICD-10-CM

## 2011-02-05 DIAGNOSIS — E785 Hyperlipidemia, unspecified: Secondary | ICD-10-CM

## 2011-02-05 DIAGNOSIS — R079 Chest pain, unspecified: Secondary | ICD-10-CM

## 2011-02-05 DIAGNOSIS — R0989 Other specified symptoms and signs involving the circulatory and respiratory systems: Secondary | ICD-10-CM

## 2011-02-05 DIAGNOSIS — I2510 Atherosclerotic heart disease of native coronary artery without angina pectoris: Secondary | ICD-10-CM

## 2011-02-05 NOTE — Progress Notes (Signed)
   Patient ID: [name] Cooper, female    DOB: 1940-09-19, 70 y.o.   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 Cardiac catheter at [facility] for chest discomfort showed patent LAD stent with no other significant stenoses. Medical management recommended   She has had very stressful family events in the past as her brother has died, husband had significant dementia. She reports that her chest tightness has significantly improved. She is back on Crestor 10 mg daily. She does have mild edema in her legs that is nonpitting. She does not wear TED hose. She is a nonsmoker  EKG shows normal sinus rhythm with rate 60 beats per minute with no significant ST or T wave changes, Unable to exclude old anteroseptal infarct    Outpatient Encounter Prescriptions as of 02/05/2011  Medication Sig Dispense Refill  . losartan-hydrochlorothiazide (HYZAAR) 100-25 MG per tablet Take 1 tablet by mouth daily.  30 tablet  6  . nitroGLYCERIN (NITROSTAT) 0.4 MG SL tablet Place 1 tablet (0.4 mg total) under the tongue every 5 (five) minutes as needed for chest pain.  25 tablet  3  . rosuvastatin (CRESTOR) 20 MG tablet Take 10 mg by mouth every other day. Pt takes 10mg  every other day. Prescribed is 20mg  daily.      Marland Kitchen zolpidem (AMBIEN) 10 MG tablet Take 10 mg by mouth at bedtime as needed.        Marland Kitchen  aspirin 81 MG EC tablet Take 81 mg by mouth daily.          Review of Systems  HENT: Negative.   Eyes: Negative.   Gastrointestinal: Negative.   Musculoskeletal: Positive for arthralgias.  Skin: Negative.   Neurological: Negative.   Hematological: Negative.   Psychiatric/Behavioral: Negative.   All other systems  reviewed and are negative.   BP 128/54  Pulse 60  Ht 5\' 7"  (1.702 m)  Wt 198 lb (89.812 kg)  BMI 31.01 kg/m2  Physical Exam  Nursing note and vitals reviewed. Constitutional: She is oriented to person, place, and time. She appears well-developed and well-nourished.  HENT:  Head: Normocephalic.  Nose: Nose normal.  Mouth/Throat: Oropharynx is clear and moist.  Eyes: Conjunctivae are normal. Pupils are equal, round, and reactive to light.  Neck: Normal range of motion. Neck supple. No JVD present.  Cardiovascular: Normal rate, regular rhythm, S1 normal, S2 normal, normal heart sounds and intact distal pulses.  Exam reveals no gallop and no friction rub.   No murmur heard. Pulmonary/Chest: Effort normal and breath sounds normal. No respiratory distress. She has no wheezes. She has no rales. She exhibits no tenderness.  Abdominal: Soft. Bowel sounds are normal. She exhibits no distension. There is no tenderness.  Musculoskeletal: Normal range of motion. She exhibits no edema and no tenderness.  Lymphadenopathy:    She has no cervical adenopathy.  Neurological: She is alert and oriented to person, place, and time. Coordination normal.  Skin: Skin is warm and dry. No rash noted. No erythema.  Psychiatric: She has a normal mood and affect. Her behavior is normal. Judgment and thought content normal.         Assessment and Plan

## 2011-02-05 NOTE — Patient Instructions (Addendum)
You are doing well. Please increase the aspiring to 81 mg x 2 a day  Please call us if you have new issues that need to be addressed before your next appt.  The office will contact you for a follow up Appt. In 12 months  We will contact you for a cholesterol check in three months.

## 2011-02-05 NOTE — Assessment & Plan Note (Signed)
Blood pressure is well controlled on today's visit. No changes made to the medications. 

## 2011-02-05 NOTE — Assessment & Plan Note (Signed)
60% bilateral carotid arterial disease. Repeat carotid ultrasound pending.

## 2011-02-05 NOTE — Assessment & Plan Note (Signed)
Currently with no symptoms of angina. No further workup at this time. Continue current medication regimen. Recent cardiac catheterization showing patent LAD stent.

## 2011-02-05 NOTE — Assessment & Plan Note (Signed)
I have suggested she stay on the Crestor 10 mg daily with a cholesterol check in 3 months time. Goal LDL less than 70.

## 2011-02-06 LAB — BASIC METABOLIC PANEL
BUN/Creatinine Ratio: 18 (ref 11–26)
BUN: 24 mg/dL (ref 8–27)
CO2: 22 mmol/L (ref 20–32)
Calcium: 8.9 mg/dL (ref 8.6–10.2)
Chloride: 102 mmol/L (ref 97–108)
Creatinine, Ser: 1.31 mg/dL — ABNORMAL HIGH (ref 0.57–1.00)
GFR calc Af Amer: 48 mL/min/{1.73_m2} — ABNORMAL LOW (ref >59)
GFR calc non Af Amer: 41 mL/min/{1.73_m2} — ABNORMAL LOW (ref >59)
Glucose: 91 mg/dL (ref 65–99)
Potassium: 3.8 mmol/L (ref 3.5–5.2)
Sodium: 141 mmol/L (ref 134–144)

## 2011-02-09 ENCOUNTER — Encounter: Payer: Self-pay | Admitting: Cardiovascular Disease

## 2011-02-13 ENCOUNTER — Encounter (INDEPENDENT_AMBULATORY_CARE_PROVIDER_SITE_OTHER): Payer: Medicare PPO | Admitting: *Deleted

## 2011-02-13 DIAGNOSIS — I6529 Occlusion and stenosis of unspecified carotid artery: Secondary | ICD-10-CM

## 2011-02-13 DIAGNOSIS — R0989 Other specified symptoms and signs involving the circulatory and respiratory systems: Secondary | ICD-10-CM

## 2011-02-13 DIAGNOSIS — E785 Hyperlipidemia, unspecified: Secondary | ICD-10-CM

## 2011-02-22 ENCOUNTER — Ambulatory Visit: Payer: Medicare PPO | Admitting: Cardiovascular Disease

## 2011-02-22 ENCOUNTER — Other Ambulatory Visit: Payer: Medicare PPO | Admitting: *Deleted

## 2011-04-10 ENCOUNTER — Ambulatory Visit: Payer: Self-pay | Admitting: Family Medicine

## 2011-05-29 ENCOUNTER — Other Ambulatory Visit: Payer: Medicare PPO

## 2011-05-29 DIAGNOSIS — E785 Hyperlipidemia, unspecified: Secondary | ICD-10-CM

## 2011-05-30 LAB — HEPATIC FUNCTION PANEL
ALT: 10 IU/L (ref 0–32)
AST: 16 IU/L (ref 0–40)
Albumin: 3.8 g/dL (ref 3.5–4.8)
Alkaline Phosphatase: 127 IU/L (ref 25–165)
Bilirubin, Direct: 0.12 mg/dL (ref 0.00–0.40)
Total Bilirubin: 0.3 mg/dL (ref 0.0–1.2)
Total Protein: 7.3 g/dL (ref 6.0–8.5)

## 2011-05-30 LAB — LIPID PANEL
Chol/HDL Ratio: 2.7 ratio units (ref 0.0–4.4)
Cholesterol, Total: 181 mg/dL (ref 100–199)
HDL: 66 mg/dL (ref >39)
LDL Calculated: 103 mg/dL — ABNORMAL HIGH (ref 0–99)
Triglycerides: 60 mg/dL (ref 0–149)
VLDL Cholesterol Cal: 12 mg/dL (ref 5–40)

## 2011-07-16 ENCOUNTER — Telehealth: Payer: Self-pay | Admitting: Cardiovascular Disease

## 2011-07-16 NOTE — Telephone Encounter (Signed)
Spoke with pt about lipid/liver results from 05/29/11. Pt is taking Crestor 10mg  every other day. I will forward to Dr Mariah Milling for any new recommendations.

## 2011-07-16 NOTE — Telephone Encounter (Signed)
Pt never got lab results from 3/5. Please call pt with results.

## 2011-07-19 NOTE — Telephone Encounter (Signed)
Patient called no answer.LMTC. 

## 2011-07-19 NOTE — Telephone Encounter (Signed)
Numbers are a little high. Sorry for delay in getting back to her. Don't know how that slipped through the cracks. I would recommend crestor 10 mg daily. Goal total cholesterol <150. Currently is 180

## 2011-07-20 ENCOUNTER — Telehealth: Payer: Self-pay | Admitting: *Deleted

## 2011-07-20 ENCOUNTER — Other Ambulatory Visit: Payer: Self-pay | Admitting: *Deleted

## 2011-07-20 MED ORDER — LOSARTAN POTASSIUM-HCTZ 100-25 MG PO TABS: 1.0000 | ORAL_TABLET | Freq: Every day | ORAL | Status: DC

## 2011-07-20 NOTE — Telephone Encounter (Signed)
Advised to increase crestor to 10mg  everyday--pt agrees--nt

## 2011-07-20 NOTE — Telephone Encounter (Signed)
Refilled Losartan

## 2011-08-03 ENCOUNTER — Other Ambulatory Visit: Payer: Self-pay | Admitting: *Deleted

## 2011-08-03 MED ORDER — LOSARTAN POTASSIUM-HCTZ 100-25 MG PO TABS: 1.0000 | ORAL_TABLET | Freq: Every day | ORAL | Status: DC

## 2011-08-03 NOTE — Telephone Encounter (Signed)
Refilled Losartan

## 2011-08-24 ENCOUNTER — Other Ambulatory Visit: Payer: Self-pay

## 2011-08-24 MED ORDER — LOSARTAN POTASSIUM-HCTZ 100-25 MG PO TABS: 1.0000 | ORAL_TABLET | Freq: Every day | ORAL | Status: DC

## 2012-02-11 ENCOUNTER — Ambulatory Visit: Payer: Medicare PPO | Admitting: Cardiovascular Disease

## 2012-02-12 ENCOUNTER — Other Ambulatory Visit: Payer: Self-pay | Admitting: Cardiology

## 2012-02-12 DIAGNOSIS — I6529 Occlusion and stenosis of unspecified carotid artery: Secondary | ICD-10-CM

## 2012-02-18 ENCOUNTER — Ambulatory Visit: Payer: Medicare PPO | Admitting: Cardiovascular Disease

## 2012-02-26 ENCOUNTER — Encounter (INDEPENDENT_AMBULATORY_CARE_PROVIDER_SITE_OTHER): Payer: Medicare PPO

## 2012-02-26 DIAGNOSIS — I6529 Occlusion and stenosis of unspecified carotid artery: Secondary | ICD-10-CM

## 2012-02-29 ENCOUNTER — Encounter: Payer: Self-pay | Admitting: Cardiovascular Disease

## 2012-02-29 ENCOUNTER — Ambulatory Visit (INDEPENDENT_AMBULATORY_CARE_PROVIDER_SITE_OTHER): Payer: Medicare PPO | Admitting: Cardiovascular Disease

## 2012-02-29 VITALS — BP 112/60 | HR 60 | Ht 67.0 in | Wt 192.5 lb

## 2012-02-29 DIAGNOSIS — E785 Hyperlipidemia, unspecified: Secondary | ICD-10-CM

## 2012-02-29 DIAGNOSIS — R0602 Shortness of breath: Secondary | ICD-10-CM

## 2012-02-29 DIAGNOSIS — I10 Essential (primary) hypertension: Secondary | ICD-10-CM

## 2012-02-29 DIAGNOSIS — I2510 Atherosclerotic heart disease of native coronary artery without angina pectoris: Secondary | ICD-10-CM

## 2012-02-29 DIAGNOSIS — I6529 Occlusion and stenosis of unspecified carotid artery: Secondary | ICD-10-CM

## 2012-02-29 NOTE — Assessment & Plan Note (Signed)
Blood pressure is well controlled on today's visit. No changes made to the medications. 

## 2012-02-29 NOTE — Progress Notes (Signed)
   Patient ID: [name] Cooper, female    DOB: 10/11/1940, 71 y.o.   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   Cardiac catheter at [facility] for chest discomfort earlier in the year showed patent LAD stent with no other significant stenoses. Medical management recommended   [name] Cooper has had very stressful family events in the past as her brother has died, husband had significant dementia. No significant chest tightness. [name] Cooper does report having recent diffuse muscle pain. Crestor was stopped and [name] Cooper was started on prednisone. Symptoms resolved. Etiology was uncertain.   [name] Cooper does have mild edema in her legs that is nonpitting.  [name] Cooper stopped aspirin secondary to bruising  EKG shows normal sinus rhythm with rate 60 beats per minute with no significant ST or T wave changes   Outpatient Encounter Prescriptions as of 02/29/2012  Medication Sig Dispense Refill  . losartan-hydrochlorothiazide (HYZAAR) 100-25 MG per tablet Take 1 tablet by mouth daily.  30 tablet  6  . zolpidem (AMBIEN) 10 MG tablet Take 10 mg by mouth at bedtime as needed.        . [DISCONTINUED] aspirin 81 MG EC tablet Take 2 tablets (162 mg total) by mouth daily.  30 tablet  11  . [DISCONTINUED] rosuvastatin (CRESTOR) 20 MG tablet Take 10 mg by mouth every other day. Pt takes 10mg  every other day. Prescribed is 20mg  daily.          Review of Systems  HENT: Negative.   Eyes: Negative.   Gastrointestinal: Negative.   Musculoskeletal: Positive for arthralgias.  Skin: Negative.   Neurological: Negative.   Hematological: Negative.   Psychiatric/Behavioral: Negative.   All other systems reviewed and are negative.   BP 112/60  Pulse 60  Ht 5\' 7"  (1.702 m)  Wt 192  lb 8 oz (87.317 kg)  BMI 30.15 kg/m2  Physical Exam  Nursing note and vitals reviewed. Constitutional: [name] Cooper is oriented to person, place, and time. [name] Cooper appears well-developed and well-nourished.  HENT:  Head: Normocephalic.  Nose: Nose normal.  Mouth/Throat: Oropharynx is clear and moist.  Eyes: Conjunctivae normal are normal. Pupils are equal, round, and reactive to light.  Neck: Normal range of motion. Neck supple. No JVD present.  Cardiovascular: Normal rate, regular rhythm, S1 normal, S2 normal, normal heart sounds and intact distal pulses.  Exam reveals no gallop and no friction rub.   No murmur heard. Pulmonary/Chest: Effort normal and breath sounds normal. No respiratory distress. [name] Cooper has no wheezes. [name] Cooper has no rales. [name] Cooper exhibits no tenderness.  Abdominal: Soft. Bowel sounds are normal. [name] Cooper exhibits no distension. There is no tenderness.  Musculoskeletal: Normal range of motion. [name] Cooper exhibits no edema and no tenderness.  Lymphadenopathy:    [name] Cooper has no cervical adenopathy.  Neurological: [name] Cooper is alert and oriented to person, place, and time. Coordination normal.  Skin: Skin is warm and dry. No rash noted. No erythema.  Psychiatric: [name] Cooper has a normal mood and affect. Her behavior is normal. Judgment and thought content normal.         Assessment and Plan

## 2012-02-29 NOTE — Patient Instructions (Addendum)
You are doing well.  Restart crestor every other day 10 mg Try for a few weeks If you have no muscle ache, go up to 10 mg daily  Please call us if you have new issues that need to be addressed before your next appt.  Your physician wants you to follow-up in: 6 months.  You will receive a reminder letter in the mail two months in advance. If you don't receive a letter, please call our office to schedule the follow-up appointment.

## 2012-02-29 NOTE — Assessment & Plan Note (Addendum)
We have suggested she restart her cholesterol medication, Crestor 10 mg,  every other day, titrating up slowly to Crestor 10 mg daily.

## 2012-02-29 NOTE — Assessment & Plan Note (Signed)
We have suggested she restart aspirin, restart her Crestor at 10 mg every other day with slow titration upward. Currently with no symptoms of angina. No further workup at this time.

## 2012-03-10 ENCOUNTER — Other Ambulatory Visit: Payer: Self-pay | Admitting: Cardiovascular Disease

## 2012-03-10 NOTE — Telephone Encounter (Signed)
Refilled Losartan-Hydrochlorothiazide.

## 2012-03-26 HISTORY — PX: FETAL BLOOD TRANSFUSION: SHX1602

## 2012-04-17 ENCOUNTER — Encounter: Payer: Self-pay | Admitting: *Deleted

## 2012-04-23 ENCOUNTER — Ambulatory Visit: Payer: Self-pay | Admitting: Family Medicine

## 2012-05-10 ENCOUNTER — Inpatient Hospital Stay: Payer: Self-pay | Admitting: Specialist

## 2012-05-10 LAB — COMPREHENSIVE METABOLIC PANEL
Alkaline Phosphatase: 126 U/L (ref 50–136)
Anion Gap: 7 (ref 7–16)
Bilirubin,Total: 0.9 mg/dL (ref 0.2–1.0)
Calcium, Total: 8.9 mg/dL (ref 8.5–10.1)
Chloride: 106 mmol/L (ref 98–107)
Co2: 26 mmol/L (ref 21–32)
Creatinine: 1.24 mg/dL (ref 0.60–1.30)
EGFR (African American): 51 — ABNORMAL LOW
Osmolality: 288 (ref 275–301)
Potassium: 5.1 mmol/L (ref 3.5–5.1)
SGOT(AST): 38 U/L — ABNORMAL HIGH (ref 15–37)
Total Protein: 7.8 g/dL (ref 6.4–8.2)

## 2012-05-10 LAB — CBC
HGB: 10.7 g/dL — ABNORMAL LOW (ref 12.0–16.0)
MCHC: 32 g/dL (ref 32.0–36.0)
RBC: 4.04 10*6/uL (ref 3.80–5.20)
WBC: 17.8 10*3/uL — ABNORMAL HIGH (ref 3.6–11.0)

## 2012-05-10 LAB — PROTIME-INR: Prothrombin Time: 12.4 secs (ref 11.5–14.7)

## 2012-05-11 LAB — URINALYSIS, COMPLETE
Bilirubin,UR: NEGATIVE
Glucose,UR: NEGATIVE mg/dL (ref 0–75)
Ketone: NEGATIVE
Nitrite: NEGATIVE
Ph: 5 (ref 4.5–8.0)
Squamous Epithelial: 2
WBC UR: 33 /HPF (ref 0–5)

## 2012-05-11 LAB — HEMOGLOBIN: HGB: 9.8 g/dL — ABNORMAL LOW (ref 12.0–16.0)

## 2012-05-14 ENCOUNTER — Encounter: Payer: Self-pay | Admitting: Internal Medicine

## 2012-05-16 ENCOUNTER — Ambulatory Visit: Payer: Self-pay | Admitting: Internal Medicine

## 2012-05-24 ENCOUNTER — Encounter: Payer: Self-pay | Admitting: Internal Medicine

## 2012-06-28 ENCOUNTER — Emergency Department: Payer: Self-pay | Admitting: Emergency Medicine

## 2012-07-22 ENCOUNTER — Other Ambulatory Visit: Payer: Self-pay | Admitting: Specialist

## 2012-07-24 ENCOUNTER — Ambulatory Visit: Payer: Self-pay | Admitting: Oncology

## 2012-07-28 ENCOUNTER — Ambulatory Visit: Payer: Self-pay | Admitting: Family Medicine

## 2012-07-31 ENCOUNTER — Encounter: Payer: Self-pay | Admitting: Cardiothoracic Surgery

## 2012-07-31 ENCOUNTER — Encounter: Payer: Self-pay | Admitting: Nurse Practitioner

## 2012-08-04 ENCOUNTER — Inpatient Hospital Stay: Payer: Self-pay | Admitting: Internal Medicine

## 2012-08-04 LAB — CBC
HCT: 30 % — ABNORMAL LOW (ref 35.0–47.0)
HGB: 9.9 g/dL — ABNORMAL LOW (ref 12.0–16.0)
HGB: 7.9 g/dL — ABNORMAL LOW (ref 12.0–16.0)
MCH: 26.7 pg (ref 26.0–34.0)
MCHC: 32.9 g/dL (ref 32.0–36.0)
MCHC: 32.8 g/dL (ref 32.0–36.0)
Platelet: 3 10*3/uL — CL (ref 150–440)
Platelet: 2 10*3/uL — CL (ref 150–440)
RBC: 3.69 10*6/uL — ABNORMAL LOW (ref 3.80–5.20)
RBC: 2.96 10*6/uL — ABNORMAL LOW (ref 3.80–5.20)
RDW: 13.4 % (ref 11.5–14.5)
WBC: 9 10*3/uL (ref 3.6–11.0)

## 2012-08-04 LAB — COMPREHENSIVE METABOLIC PANEL
Albumin: 3.4 g/dL (ref 3.4–5.0)
Alkaline Phosphatase: 118 U/L (ref 50–136)
Anion Gap: 6 — ABNORMAL LOW (ref 7–16)
BUN: 24 mg/dL — ABNORMAL HIGH (ref 7–18)
Chloride: 106 mmol/L (ref 98–107)
Co2: 25 mmol/L (ref 21–32)
Creatinine: 2.07 mg/dL — ABNORMAL HIGH (ref 0.60–1.30)
EGFR (Non-African Amer.): 24 — ABNORMAL LOW
Glucose: 91 mg/dL (ref 65–99)
Osmolality: 277 (ref 275–301)
Potassium: 3.9 mmol/L (ref 3.5–5.1)
SGOT(AST): 25 U/L (ref 15–37)
SGPT (ALT): 14 U/L (ref 12–78)
Sodium: 137 mmol/L (ref 136–145)
Total Protein: 8.1 g/dL (ref 6.4–8.2)

## 2012-08-04 LAB — DIFFERENTIAL
Bands: 1 %
Basophil: 1 %
Comment - H1-Com1: NORMAL
Comment - H1-Com2: NORMAL
Eosinophil: 4 %
Lymphocytes: 23 %
Monocytes: 11 %
Segmented Neutrophils: 59 %
Segmented Neutrophils: 62 %

## 2012-08-04 LAB — LACTATE DEHYDROGENASE: LDH: 255 U/L — ABNORMAL HIGH (ref 81–246)

## 2012-08-05 LAB — COMPREHENSIVE METABOLIC PANEL
Alkaline Phosphatase: 114 U/L (ref 50–136)
Anion Gap: 6 — ABNORMAL LOW (ref 7–16)
Chloride: 108 mmol/L — ABNORMAL HIGH (ref 98–107)
Co2: 23 mmol/L (ref 21–32)
Creatinine: 1.92 mg/dL — ABNORMAL HIGH (ref 0.60–1.30)
EGFR (African American): 30 — ABNORMAL LOW
EGFR (Non-African Amer.): 26 — ABNORMAL LOW
Glucose: 167 mg/dL — ABNORMAL HIGH (ref 65–99)
Osmolality: 281 (ref 275–301)
Potassium: 4.3 mmol/L (ref 3.5–5.1)
SGOT(AST): 24 U/L (ref 15–37)
SGPT (ALT): 15 U/L (ref 12–78)
Sodium: 137 mmol/L (ref 136–145)
Total Protein: 7.5 g/dL (ref 6.4–8.2)

## 2012-08-05 LAB — CBC WITH DIFFERENTIAL/PLATELET
Basophil #: 0 10*3/uL (ref 0.0–0.1)
Eosinophil #: 0 10*3/uL (ref 0.0–0.7)
HGB: 8.5 g/dL — ABNORMAL LOW (ref 12.0–16.0)
Lymphocyte #: 0.7 10*3/uL — ABNORMAL LOW (ref 1.0–3.6)
Lymphocyte %: 9.7 %
MCH: 26.8 pg (ref 26.0–34.0)
MCV: 80 fL (ref 80–100)
Monocyte #: 0.1 x10 3/mm — ABNORMAL LOW (ref 0.2–0.9)
Neutrophil #: 6.2 10*3/uL (ref 1.4–6.5)
Neutrophil %: 88.6 %
Platelet: 39 10*3/uL — ABNORMAL LOW (ref 150–440)
RBC: 3.19 10*6/uL — ABNORMAL LOW (ref 3.80–5.20)
Variant Lymphocyte - H1-Rlymph: 1 %

## 2012-08-06 ENCOUNTER — Ambulatory Visit: Payer: Self-pay | Admitting: Oncology

## 2012-08-06 LAB — CBC WITH DIFFERENTIAL/PLATELET
Basophil #: 0 10*3/uL (ref 0.0–0.1)
Eosinophil %: 0 %
HCT: 23.9 % — ABNORMAL LOW (ref 35.0–47.0)
HGB: 7.8 g/dL — ABNORMAL LOW (ref 12.0–16.0)
MCHC: 32.7 g/dL (ref 32.0–36.0)
MCV: 81 fL (ref 80–100)
Monocyte #: 0.9 x10 3/mm (ref 0.2–0.9)
Neutrophil #: 9 10*3/uL — ABNORMAL HIGH (ref 1.4–6.5)
Neutrophil %: 78.4 %
RDW: 13.2 % (ref 11.5–14.5)

## 2012-08-08 LAB — CBC CANCER CENTER
Basophil #: 0.1 x10 3/mm (ref 0.0–0.1)
Basophil %: 0.4 %
Eosinophil #: 0.1 x10 3/mm (ref 0.0–0.7)
Eosinophil %: 0.6 %
HCT: 27.8 % — ABNORMAL LOW (ref 35.0–47.0)
HGB: 8.7 g/dL — ABNORMAL LOW (ref 12.0–16.0)
Lymphocyte #: 4.5 x10 3/mm — ABNORMAL HIGH (ref 1.0–3.6)
MCH: 26.1 pg (ref 26.0–34.0)
MCV: 83 fL (ref 80–100)
Monocyte %: 7.4 %
Neutrophil %: 60.7 %
Platelet: 182 x10 3/mm (ref 150–440)
RDW: 13.6 % (ref 11.5–14.5)
WBC: 14.5 x10 3/mm — ABNORMAL HIGH (ref 3.6–11.0)

## 2012-08-21 DIAGNOSIS — T8579XA Infection and inflammatory reaction due to other internal prosthetic devices, implants and grafts, initial encounter: Secondary | ICD-10-CM | POA: Insufficient documentation

## 2012-08-24 ENCOUNTER — Encounter: Payer: Self-pay | Admitting: Cardiothoracic Surgery

## 2012-08-24 ENCOUNTER — Ambulatory Visit: Payer: Self-pay | Admitting: Oncology

## 2012-08-24 ENCOUNTER — Encounter: Payer: Self-pay | Admitting: Nurse Practitioner

## 2012-11-19 ENCOUNTER — Ambulatory Visit: Payer: Self-pay | Admitting: Oncology

## 2012-12-02 ENCOUNTER — Ambulatory Visit: Payer: Self-pay | Admitting: Oncology

## 2012-12-02 LAB — LACTATE DEHYDROGENASE: LDH: 246 U/L (ref 81–246)

## 2012-12-02 LAB — FERRITIN: Ferritin (ARMC): 141 ng/mL (ref 8–388)

## 2012-12-02 LAB — RETICULOCYTES: Reticulocyte: 1.56 % (ref 0.4–3.1)

## 2012-12-02 LAB — CBC CANCER CENTER
Eosinophil %: 1.7 %
HCT: 33.8 % — ABNORMAL LOW (ref 35.0–47.0)
Lymphocyte %: 17.9 %
MCHC: 30.7 g/dL — ABNORMAL LOW (ref 32.0–36.0)
Monocyte #: 1.2 x10 3/mm — ABNORMAL HIGH (ref 0.2–0.9)
Monocyte %: 10 %
Platelet: 302 x10 3/mm (ref 150–440)
RDW: 13.5 % (ref 11.5–14.5)

## 2012-12-02 LAB — IRON AND TIBC: Unbound Iron-Bind.Cap.: 222 ug/dL

## 2012-12-24 ENCOUNTER — Ambulatory Visit: Payer: Self-pay | Admitting: Oncology

## 2013-01-14 ENCOUNTER — Ambulatory Visit: Payer: Self-pay | Admitting: Rheumatology

## 2013-01-24 ENCOUNTER — Ambulatory Visit: Payer: Self-pay | Admitting: Oncology

## 2013-03-30 ENCOUNTER — Other Ambulatory Visit: Payer: Self-pay | Admitting: Cardiovascular Disease

## 2013-04-21 ENCOUNTER — Encounter (INDEPENDENT_AMBULATORY_CARE_PROVIDER_SITE_OTHER): Payer: Medicare PPO

## 2013-04-21 DIAGNOSIS — I6529 Occlusion and stenosis of unspecified carotid artery: Secondary | ICD-10-CM

## 2013-04-24 ENCOUNTER — Encounter: Payer: Self-pay | Admitting: Cardiovascular Disease

## 2013-04-24 ENCOUNTER — Ambulatory Visit (INDEPENDENT_AMBULATORY_CARE_PROVIDER_SITE_OTHER): Payer: Medicare PPO | Admitting: Cardiovascular Disease

## 2013-04-24 VITALS — BP 140/64 | HR 64 | Ht 67.0 in | Wt 197.5 lb

## 2013-04-24 DIAGNOSIS — R079 Chest pain, unspecified: Secondary | ICD-10-CM

## 2013-04-24 DIAGNOSIS — I208 Other forms of angina pectoris: Secondary | ICD-10-CM

## 2013-04-24 DIAGNOSIS — I2510 Atherosclerotic heart disease of native coronary artery without angina pectoris: Secondary | ICD-10-CM

## 2013-04-24 DIAGNOSIS — I6529 Occlusion and stenosis of unspecified carotid artery: Secondary | ICD-10-CM

## 2013-04-24 DIAGNOSIS — R000 Tachycardia, unspecified: Secondary | ICD-10-CM

## 2013-04-24 DIAGNOSIS — I10 Essential (primary) hypertension: Secondary | ICD-10-CM

## 2013-04-24 DIAGNOSIS — E785 Hyperlipidemia, unspecified: Secondary | ICD-10-CM

## 2013-04-24 DIAGNOSIS — I2089 Other forms of angina pectoris: Secondary | ICD-10-CM

## 2013-04-24 MED ORDER — ROSUVASTATIN CALCIUM 20 MG PO TABS: 20.0000 mg | ORAL_TABLET | Freq: Every day | ORAL | Status: DC

## 2013-04-24 NOTE — Assessment & Plan Note (Signed)
Suggested we follow her symptoms closely. If symptoms get worse, would recommend stress test or catheterization

## 2013-04-24 NOTE — Patient Instructions (Addendum)
You are doing well. Please start aspirin one a day, 81 mg and crestor daily Please call if you have more chest pain, arm pain  Please call us if you have new issues that need to be addressed before your next appt.  Your physician wants you to follow-up in: 6 months.  You will receive a reminder letter in the mail two months in advance. If you don't receive a letter, please call our office to schedule the follow-up appointment.

## 2013-04-24 NOTE — Assessment & Plan Note (Signed)
Moderate bilateral carotid disease. Suggested she restart her Crestor 10 mg daily

## 2013-04-24 NOTE — Progress Notes (Signed)
Patient ID: [name] LittenDelores Paulson, female    DOB: 1940-11-12, 73 y.o.   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   [facility] chest discomfort earlier in the year showed patent LAD stent with no other significant stenoses. Medical management recommended   In followup today, she is at significant stress at home. Recently placed her husband into reverse and home 2 days ago. He has Lewy body dementia. He is having a difficult time adjusting. She has been having symptoms of palpitations, left arm discomfort, jaw pain only at nighttime when she is trying to sleep. She sits up, symptoms typically relieved with position changes and belching. No significant symptoms during the daytime with exertion. Otherwise she is very active at baseline. Uncertain if symptoms are similar to previous anginal-type symptoms from several years ago.  She does have mild edema in her legs that is nonpitting.  She stopped aspirin secondary to bruising  Carotid arterial disease noted bilaterally 40-59% on 10 or 28 2015 Currently not on a statin. She had stopped this on her own.  EKG shows normal sinus rhythm with rate 64 beats per minute with no significant ST or T wave changes   Outpatient Encounter Prescriptions as of 04/24/2013  Medication Sig  . gabapentin (NEURONTIN) 100 MG capsule Take 100 mg by mouth 3 (three) times daily.   Marland Kitchen. losartan-hydrochlorothiazide (HYZAAR) 100-25 MG per tablet take 1 tablet by mouth once daily  . zolpidem (AMBIEN) 10 MG tablet Take 10 mg by mouth at bedtime as needed.       Review of Systems  Constitutional: Negative.   HENT: Negative.   Eyes: Negative.   Respiratory: Negative.   Cardiovascular:  Negative.   Gastrointestinal: Negative.   Endocrine: Negative.   Musculoskeletal: Positive for arthralgias.  Skin: Negative.   Allergic/Immunologic: Negative.   Neurological: Negative.   Hematological: Negative.   Psychiatric/Behavioral: Negative.   All other systems reviewed and are negative.   BP 140/64  Pulse 64  Ht 5\' 7"  (1.702 m)  Wt 197 lb 8 oz (89.585 kg)  BMI 30.93 kg/m2  Physical Exam  Nursing note and vitals reviewed. Constitutional: She is oriented to person, place, and time. She appears well-developed and well-nourished.  HENT:  Head: Normocephalic.  Nose: Nose normal.  Mouth/Throat: Oropharynx is clear and moist.  Eyes: Conjunctivae are normal. Pupils are equal, round, and reactive to light.  Neck: Normal range of motion. Neck supple. No JVD present.  Cardiovascular: Normal rate, regular rhythm, S1 normal, S2 normal, normal heart sounds and intact distal pulses.  Exam reveals no gallop and no friction rub.   No murmur heard. Pulmonary/Chest: Effort normal and breath sounds normal. No respiratory distress. She has no wheezes. She has no rales. She exhibits no tenderness.  Abdominal: Soft. Bowel sounds are normal. She exhibits no distension. There is no tenderness.  Musculoskeletal: Normal range of motion. She exhibits no edema and no tenderness.  Lymphadenopathy:    She has no cervical adenopathy.  Neurological: She is alert and oriented to person, place, and time. Coordination normal.  Skin: Skin is warm and dry. No rash noted. No erythema.  Psychiatric: She has a normal mood and affect. Her behavior is normal. Judgment and thought content normal.  Assessment and Plan

## 2013-04-24 NOTE — Assessment & Plan Note (Signed)
Suggested she restart Crestor 10 mg daily. Goal LDL less than 70

## 2013-04-24 NOTE — Assessment & Plan Note (Signed)
Blood pressure is well controlled on today's visit. No changes made to the medications. 

## 2013-04-24 NOTE — Assessment & Plan Note (Addendum)
Mental pain, jaw pain, palpitations at nighttime only. No symptoms with exertion during the daytime. We discussed the various treatment options with her including stress testing, cardiac catheterization, we'll medical management. He prefers to monitor her symptoms for now, restart aspirin, statin. We'll call us if symptoms present with any exertion or if they get worse or more frequent.

## 2013-10-19 DIAGNOSIS — N200 Calculus of kidney: Secondary | ICD-10-CM | POA: Insufficient documentation

## 2013-10-19 DIAGNOSIS — D649 Anemia, unspecified: Secondary | ICD-10-CM | POA: Insufficient documentation

## 2013-10-19 DIAGNOSIS — N289 Disorder of kidney and ureter, unspecified: Secondary | ICD-10-CM | POA: Insufficient documentation

## 2013-10-19 DIAGNOSIS — I25118 Atherosclerotic heart disease of native coronary artery with other forms of angina pectoris: Secondary | ICD-10-CM | POA: Insufficient documentation

## 2013-10-19 DIAGNOSIS — IMO0002 Reserved for concepts with insufficient information to code with codable children: Secondary | ICD-10-CM | POA: Insufficient documentation

## 2013-10-19 DIAGNOSIS — M353 Polymyalgia rheumatica: Secondary | ICD-10-CM | POA: Insufficient documentation

## 2013-10-19 DIAGNOSIS — I878 Other specified disorders of veins: Secondary | ICD-10-CM | POA: Insufficient documentation

## 2013-10-19 DIAGNOSIS — I10 Essential (primary) hypertension: Secondary | ICD-10-CM | POA: Insufficient documentation

## 2013-10-19 DIAGNOSIS — D696 Thrombocytopenia, unspecified: Secondary | ICD-10-CM | POA: Insufficient documentation

## 2013-12-14 ENCOUNTER — Ambulatory Visit: Payer: Medicare PPO | Admitting: Cardiovascular Disease

## 2013-12-17 ENCOUNTER — Ambulatory Visit: Payer: Self-pay | Admitting: Family Medicine

## 2013-12-18 ENCOUNTER — Encounter: Payer: Self-pay | Admitting: Cardiovascular Disease

## 2013-12-18 ENCOUNTER — Ambulatory Visit (INDEPENDENT_AMBULATORY_CARE_PROVIDER_SITE_OTHER): Payer: Medicare Other | Admitting: Cardiovascular Disease

## 2013-12-18 VITALS — BP 146/79 | HR 63 | Ht 67.0 in | Wt 201.8 lb

## 2013-12-18 DIAGNOSIS — E785 Hyperlipidemia, unspecified: Secondary | ICD-10-CM

## 2013-12-18 DIAGNOSIS — R0789 Other chest pain: Secondary | ICD-10-CM

## 2013-12-18 DIAGNOSIS — I2510 Atherosclerotic heart disease of native coronary artery without angina pectoris: Secondary | ICD-10-CM

## 2013-12-18 DIAGNOSIS — I10 Essential (primary) hypertension: Secondary | ICD-10-CM

## 2013-12-18 DIAGNOSIS — I208 Other forms of angina pectoris: Secondary | ICD-10-CM

## 2013-12-18 DIAGNOSIS — R0989 Other specified symptoms and signs involving the circulatory and respiratory systems: Secondary | ICD-10-CM

## 2013-12-18 DIAGNOSIS — I6529 Occlusion and stenosis of unspecified carotid artery: Secondary | ICD-10-CM

## 2013-12-18 NOTE — Assessment & Plan Note (Signed)
Suggested she restart Crestor 10 mg every other day or 3 days per week as tolerated

## 2013-12-18 NOTE — Assessment & Plan Note (Signed)
Currently with no symptoms of angina. Stable. Recommend she restart aspirin and statin

## 2013-12-18 NOTE — Patient Instructions (Addendum)
You are doing well.  Please restart aspirin daily 81 mg daily Restart crestor 2 to 3 times per week  Please call us if you have new issues that need to be addressed before your next appt.  Your physician wants you to follow-up in: 6 months.  You will receive a reminder letter in the mail two months in advance. If you don't receive a letter, please call our office to schedule the follow-up appointment.

## 2013-12-18 NOTE — Assessment & Plan Note (Signed)
40-59% bilateral carotid arterial disease. Recommended she restart her Crestor as tolerated, every other day

## 2013-12-18 NOTE — Assessment & Plan Note (Signed)
Recheck of her blood pressure shows systolic in the 135 range. No medication changes made

## 2013-12-18 NOTE — Assessment & Plan Note (Signed)
Currently with no symptoms of angina. No further workup at this time. Continue current medication regimen. 

## 2013-12-18 NOTE — Progress Notes (Signed)
   Patient ID: [name] Cooper, female    DOB: Apr 22, 1940, 73 y.o.   MRN: [id number]  HPI Comments: [name] is a very pleasant 73 year old woman with history of coronary artery disease, stent placed to her mid LAD with repeat stenting for in-stent restenosis Over 10 years ago, hyperlipidemia, hypertension, fibromyalgia and diffuse muscle aches and arthritis who presents for Followup.   Cardiac catheter at [facility] for chest discomfort earlier in the year showed patent LAD stent with no other significant stenoses. Medical management recommended  In followup today, she reports that her husband passed away earlier in 2013-08-18 from lewy body disease. She is still adjusting to her loss. She is relatively active, no irregular exercise. She suffered a ankle fracture February 2014 on the left leg, ankle area. Continues to have mild residual swelling  She's not been taking aspirin or her Crestor Crestor in the past cause muscle ache, aspirin was causing bruising  Carotid arterial disease noted bilaterally 40-59%   EKG shows normal sinus rhythm with rate 63 beats per minute with no significant ST or T wave changes   Outpatient Encounter Prescriptions as of 12/18/2013  Medication Sig  . losartan-hydrochlorothiazide (HYZAAR) 100-25 MG per tablet take 1 tablet by mouth once daily  . zolpidem (AMBIEN) 10 MG tablet Take 10 mg by mouth at bedtime as needed.      Review of Systems  Constitutional: Negative.   HENT: Negative.   Eyes: Negative.   Respiratory: Negative.   Cardiovascular: Negative.   Gastrointestinal: Negative.   Endocrine: Negative.   Musculoskeletal: Positive for arthralgias.  Skin: Negative.   Allergic/Immunologic: Negative.   Neurological: Negative.   Hematological: Negative.   Psychiatric/Behavioral: Negative.   All other systems reviewed and are negative.  BP 146/79  Pulse 63  Ht  (1.702 m)  Wt 201 lb 12 oz (91.513 kg)  BMI 31.59 kg/m2  Physical Exam  Nursing note and  vitals reviewed. Constitutional: She is oriented to person, place, and time. She appears well-developed and well-nourished.  HENT:  Head: Normocephalic.  Nose: Nose normal.  Mouth/Throat: Oropharynx is clear and moist.  Eyes: Conjunctivae are normal. Pupils are equal, round, and reactive to light.  Neck: Normal range of motion. Neck supple. No JVD present.  Cardiovascular: Normal rate, regular rhythm, S1 normal, S2 normal, normal heart sounds and intact distal pulses.  Exam reveals no gallop and no friction rub.   No murmur heard. Pulmonary/Chest: Effort normal and breath sounds normal. No respiratory distress. She has no wheezes. She has no rales. She exhibits no tenderness.  Abdominal: Soft. Bowel sounds are normal. She exhibits no distension. There is no tenderness.  Musculoskeletal: Normal range of motion. She exhibits no edema and no tenderness.  Lymphadenopathy:    She has no cervical adenopathy.  Neurological: She is alert and oriented to person, place, and time. Coordination normal.  Skin: Skin is warm and dry. No rash noted. No erythema.  Psychiatric: She has a normal mood and affect. Her behavior is normal. Judgment and thought content normal.    Assessment and Plan

## 2013-12-19 ENCOUNTER — Emergency Department: Payer: Self-pay | Admitting: Emergency Medicine

## 2013-12-28 ENCOUNTER — Emergency Department: Payer: Self-pay | Admitting: Emergency Medicine

## 2013-12-28 LAB — COMPREHENSIVE METABOLIC PANEL
ALT: 13 U/L — AB
Albumin: 3.2 g/dL — ABNORMAL LOW (ref 3.4–5.0)
Alkaline Phosphatase: 137 U/L — ABNORMAL HIGH
Anion Gap: 6 — ABNORMAL LOW (ref 7–16)
BUN: 22 mg/dL — ABNORMAL HIGH (ref 7–18)
Bilirubin,Total: 0.4 mg/dL (ref 0.2–1.0)
CO2: 28 mmol/L (ref 21–32)
CREATININE: 1.47 mg/dL — AB (ref 0.60–1.30)
Calcium, Total: 9.1 mg/dL (ref 8.5–10.1)
Chloride: 107 mmol/L (ref 98–107)
EGFR (African American): 45 — ABNORMAL LOW
EGFR (Non-African Amer.): 37 — ABNORMAL LOW
Glucose: 91 mg/dL (ref 65–99)
Osmolality: 284 (ref 275–301)
POTASSIUM: 3.6 mmol/L (ref 3.5–5.1)
SGOT(AST): 16 U/L (ref 15–37)
Sodium: 141 mmol/L (ref 136–145)
Total Protein: 7.9 g/dL (ref 6.4–8.2)

## 2013-12-28 LAB — URINALYSIS, COMPLETE
Bilirubin,UR: NEGATIVE
Glucose,UR: NEGATIVE mg/dL (ref 0–75)
Ketone: NEGATIVE
Nitrite: POSITIVE
Ph: 5 (ref 4.5–8.0)
Protein: NEGATIVE
RBC,UR: 5 /HPF (ref 0–5)
Specific Gravity: 1.015 (ref 1.003–1.030)
Squamous Epithelial: 1
WBC UR: 176 /HPF (ref 0–5)

## 2013-12-28 LAB — CBC
HCT: 34 % — ABNORMAL LOW (ref 35.0–47.0)
HGB: 10.9 g/dL — ABNORMAL LOW (ref 12.0–16.0)
MCH: 27.3 pg (ref 26.0–34.0)
MCHC: 31.9 g/dL — AB (ref 32.0–36.0)
MCV: 85 fL (ref 80–100)
Platelet: 310 10*3/uL (ref 150–440)
RBC: 3.99 10*6/uL (ref 3.80–5.20)
RDW: 12.8 % (ref 11.5–14.5)
WBC: 9.5 10*3/uL (ref 3.6–11.0)

## 2013-12-30 LAB — URINE CULTURE

## 2014-03-09 ENCOUNTER — Other Ambulatory Visit: Payer: Self-pay | Admitting: Cardiovascular Disease

## 2014-04-14 DIAGNOSIS — M9949 Connective tissue stenosis of neural canal of abdomen and other regions: Secondary | ICD-10-CM | POA: Insufficient documentation

## 2014-04-14 DIAGNOSIS — M5136 Other intervertebral disc degeneration, lumbar region: Secondary | ICD-10-CM | POA: Insufficient documentation

## 2014-04-14 DIAGNOSIS — M5417 Radiculopathy, lumbosacral region: Secondary | ICD-10-CM | POA: Insufficient documentation

## 2014-04-14 DIAGNOSIS — M7660 Achilles tendinitis, unspecified leg: Secondary | ICD-10-CM | POA: Insufficient documentation

## 2014-04-23 ENCOUNTER — Ambulatory Visit: Payer: Self-pay | Admitting: Surgery

## 2014-05-03 DIAGNOSIS — M169 Osteoarthritis of hip, unspecified: Secondary | ICD-10-CM | POA: Insufficient documentation

## 2014-05-03 DIAGNOSIS — M7060 Trochanteric bursitis, unspecified hip: Secondary | ICD-10-CM | POA: Insufficient documentation

## 2014-05-24 ENCOUNTER — Other Ambulatory Visit: Payer: Self-pay | Admitting: Radiology

## 2014-05-24 DIAGNOSIS — I6523 Occlusion and stenosis of bilateral carotid arteries: Secondary | ICD-10-CM

## 2014-06-03 ENCOUNTER — Encounter: Payer: Self-pay | Admitting: *Deleted

## 2014-06-03 DIAGNOSIS — R0989 Other specified symptoms and signs involving the circulatory and respiratory systems: Secondary | ICD-10-CM

## 2014-07-16 NOTE — Consult Note (Signed)
Brief Consult Note: Diagnosis: cellulitis left lower ler-- MRSA.   Patient was seen by consultant.   Recommend to proceed with surgery or procedure.   Comments: 74 year old female 3 months post left ankle fracture with open reduction and internal fixation.  Initially had a small puncture wound but she healed well.  Developed abrasion over medial and lateral wounds 3-4 weeks ago from fracture boot rubbing on skin and was seen two weeks ago.  Cellulitis observed and she was started on keflex and septra.  Cultures grew MRSA.  Seen at wound clinic last week as ordered by Dr Burnett ShengHedrick. Developed bruising and was found to have markedly lowered platelet count as a result of the septra probably.  Admitted for transfusions and changes in antibiotics.  Remains afebrile.   Exam:  alert and cooperative.  circulation/sensation/motor function good. small dressings medial and lateral.  some reddness and swelling lower leg.  No change in appearance from 2 weeks ago.    X-rays: in office 2 weeks ago showed good healing without osteomyelitis.  Rx:  Antibiotics per medicine and infectious disease.  return to  clinic 7-10 days.  Would not want to remove hardware this early if possible..  Electronic Signatures: Valinda HoarMiller, Rihana Kiddy E (MD)  (Signed 13-May-14 11:58)  Authored: Brief Consult Note   Last Updated: 13-May-14 11:58 by Valinda HoarMiller, Lanice Folden E (MD)

## 2014-07-16 NOTE — H&P (Signed)
Subjective/Chief Complaint Pain left ankle   History of Present Illness 74 year old female fell on ice getting her mail tonight injuring the left ankle.  Brought to Emergency Room where exam and X-rays show a trimalleolar fracture dislocation left ankle.  There is a punctate wound medially.  Discussed surgery with patient and family and will plan on open reduction and internal fixation of the ankle. Will give IV antibiotics until surgery.  Risks and benefits of surgery were discussed at length including but not limited to infection, non union, nerve or blood vessed damage, non union, need for repeat surgery, blood clots and lung emboli, and death.   Past Med/Surgical Hx:  Arthritis:   Kidney Stones:   Gallstones:   HTN:   CAD:   Stent - Cardiac:   ALLERGIES:  No Known Allergies:   Family and Social History:  Family History Non-Contributory   Social History negative tobacco, negative ETOH   Place of Living Home   Review of Systems:  Fever/Chills No   Cough No   Sputum No   Abdominal Pain No   Physical Exam:  GEN well developed, well nourished, no acute distress   HEENT pink conjunctivae   NECK supple   RESP normal resp effort   CARD regular rate   ABD denies tenderness   LYMPH negative neck   EXTR negative edema, Left ankle displaced. Punctate wound medially.  circulation/sensation/motor function good.  Pain with range of motion.   SKIN normal to palpation   NEURO motor/sensory function intact   PSYCH alert, A+O to time, place, person   Lab Results: Hepatic:  15-Feb-14 22:03   Bilirubin, Total 0.9  Alkaline Phosphatase 126  SGPT (ALT) 23  SGOT (AST)  38  Total Protein, Serum 7.8  Albumin, Serum 3.4  Routine Chem:  15-Feb-14 22:03   Glucose, Serum  135  BUN  36  Creatinine (comp) 1.24  Sodium, Serum 139  Potassium, Serum 5.1  Chloride, Serum 106  CO2, Serum 26  Calcium (Total), Serum 8.9  Osmolality (calc) 288  eGFR (African American)  51   eGFR (Non-African American)  44 (eGFR values <81m/min/1.73 m2 may be an indication of chronic kidney disease (CKD). Calculated eGFR is useful in patients with stable renal function. The eGFR calculation will not be reliable in acutely ill patients when serum creatinine is changing rapidly. It is not useful in  patients on dialysis. The eGFR calculation may not be applicable to patients at the low and high extremes of body sizes, pregnant women, and vegetarians.)  Result Comment POTASSIUM/AST - Slight hemolysis, interpret results with  - caution.  Result(s) reported on 10 May 2012 at 10:32PM.  Anion Gap 7  Routine Coag:  15-Feb-14 22:03   Prothrombin 12.4  INR 0.9 (INR reference interval applies to patients on anticoagulant therapy. A single INR therapeutic range for coumarins is not optimal for all indications; however, the suggested range for most indications is 2.0 - 3.0. Exceptions to the INR Reference Range may include: Prosthetic heart valves, acute myocardial infarction, prevention of myocardial infarction, and combinations of aspirin and anticoagulant. The need for a higher or lower target INR must be assessed individually. Reference: The Pharmacology and Management of the Vitamin K  antagonists: the seventh ACCP Conference on Antithrombotic and Thrombolytic Therapy. CKGSUP.1031Sept:126 (3suppl): [id number] A HCT value >55% may artifactually increase the PT.  In one study,  the increase was an average of 25%. Reference:  "Effect on Routine and Special Coagulation Testing  Values of Citrate Anticoagulant Adjustment in Patients with High HCT Values." American Journal of Clinical Pathology 2006;126:400-405.)  Routine Hem:  15-Feb-14 22:03   WBC (CBC)  17.8  RBC (CBC) 4.04  Hemoglobin (CBC)  10.7  Hematocrit (CBC)  33.4  Platelet Count (CBC) 303 (Result(s) reported on 10 May 2012 at 10:32PM.)  MCV 83  MCH 26.5  MCHC 32.0  RDW 13.8    Assessment/Admission Diagnosis  Left ankle trimalleolar fracture dislocation with punctate wound medially   Plan Closed reduction with 4% xylocaine.  Splinted. IV antibiotics open reduction and internal fixation in a few hours.   Electronic Signatures: Park Breed (MD)  (Signed 15-Feb-14 23:13)  Authored: CHIEF COMPLAINT and HISTORY, PAST MEDICAL/SURGIAL HISTORY, ALLERGIES, FAMILY AND SOCIAL HISTORY, REVIEW OF SYSTEMS, PHYSICAL EXAM, LABS, ASSESSMENT AND PLAN   Last Updated: 15-Feb-14 23:13 by Park Breed (MD)

## 2014-07-16 NOTE — Consult Note (Signed)
Impression:    74yo female w/ h/o recent left leg fracture in 0/2542, s/p ORIF complicated by Methacillin Resistant Staph aureus infection who was admitted with severe thrombocytopenia due to sulfa drugs.     Agree with stopping TMP/SMX.  Will wait for plts to rebound.    Unclear at this time if hardware is infected.  If so, optimal therapy would be to remove hardware and provide 6 weeks of IV therapy.  If unable to remove hardware, then may need to consider chronic suppression therapy.      DWill discussed with radiology about  the best way of looking at hardware and bone to determine if there is infection present.  None of their studies would be able to distinguish between healing bone and infection at this point.  Will need to presume that the bone is infected.    Will get ESR and CRP.    d/c vanco.  Will start doxycycline.  Would plan on indefinite therapy.  If her bone fuses, then it may be possible to stop the antibiotics and if she worsens, remove the hardware in the future and give her IV therapy.  There is no benefit from IV therapy at this time as if the hardware is infected, we are unlikely to cure her with out its removal. Hold further antibiotics for now.    OWill need to know for ortho indicated that  whether removal of hardware would not be feasible.   Electronic Signatures: Jujuan Dugo MPH, Heinz Knuckles (MD) (Signed on 13-May-14 13:34)  Authored   Last Updated: 13-May-14 14:18 by Shikira Folino MPH, Heinz Knuckles (MD)

## 2014-07-16 NOTE — Consult Note (Signed)
Patient is a 74 year old female who presented petechial rash and a platelet count of 3.  I personally reviewed her peripheral smear and there is no evidence of hemolytic anemia, therefore TTP is highly unlikely.  Agree with Dr. Laurin Coder that the time course does not fit for ITP either with a normal platelet count in February of this year.  Also agree that the most likely culprit is Bactrim.  Bactrim has been discontinued.  It has a half-life of 8-10 hours, but this may be prolonged with her mild renal insufficiency.  Would expect platelet count to start to recover in 2-3 days.  Recommend transfusing platelets to maintain count above 20.  She does not require a bone marrow biopsy at this time, but if no improvement would consider one in the near future.  Antiplatelet antibodies have been ordered for completeness. consult to follow.  Electronic Signatures: Delight Hoh (MD)  (Signed on 12-May-14 21:03)  Authored  Last Updated: 12-May-14 21:03 by Delight Hoh (MD)

## 2014-07-16 NOTE — Consult Note (Signed)
PATIENT NAME:  [name] Cooper, DERCOLE MR#:  [id number] DATE OF BIRTH:  [birth year]  DATE OF CONSULTATION:  05/11/2012  REFERRING PHYSICIAN: Dr. Hyacinth Meeker, orthopedics CONSULTING PHYSICIAN:  Ramonita Lab, MD  PRIMARY CARE PHYSICIAN: Dr. Burnett Sheng.   CHIEF COMPLAINT: Fall and left ankle fracture.   HISTORY OF PRESENT ILLNESS: The patient is a 74 year old African American female with a past medical history of coronary artery disease, status post 3e stents in year 2000, hypertension, hyperlipidemia and possible rheumatoid arthritis and she is currently on Prednisone, currently getting workup done. She was brought into the ER after she sustained a fall. The patient was reporting that she was walking on her driveway and slipped on ice and fell. The patient was unable to get up as she has twisted her left ankle. In the ER, she was diagnosed with left ankle fracture with open wound. The patient was evaluated by Dr. Hyacinth Meeker and he has admitted the patient. Medical consult was placed regarding medical clearance from the hospitalist group. The patient has a lot of pain in the left ankle. Denies any chest pain or shortness of breath, but she was recently treated with antibiotics for a UTI approximately 2 to 3 weeks ago. The patient is reporting that recently she was feeling a little shortness of breath, but denies any cough. She was getting investigation by her primary care physician regarding possibility of rheumatoid arthritis and she was placed on prednisone. She has reported that she sees Dr. Mariah Milling regarding her heart and last cardiac catheterization was done 2 years ago, which was within normal limits. She had seen Dr. Mariah Milling approximately 1 month ago and everything was normal at that time. She denies any chest pain or shortness of breath. Denies any coughing or abdominal pain. Denies any other complaints.   PAST MEDICAL HISTORY: Coronary artery disease, hypertension, hyperlipidemia and probable rheumatoid arthritis.    PAST SURGICAL HISTORY: Cholecystectomy.   ALLERGIES: No known drug allergies.   PSYCHOSOCIAL HISTORY: Lives at home with husband. Denies smoking, alcohol or illicit drug usage.   FAMILY HISTORY: Mother recently had a heart attack at age 35.  REVIEW OF SYSTEMS:   CONSTITUTIONAL: Denies any fever, fatigue, weakness, weight loss or weight gain.  EYES: Denies blurry vision or glaucoma.  EARS, NOSE, THROAT: No tinnitus, ear pain, hearing loss or postnasal drip.  RESPIRATORY: No cough or wheezing. Denies any COPD.  CARDIOVASCULAR: No chest pain, orthopnea or syncope.  GASTROINTESTINAL: Denies nausea, vomiting, diarrhea or abdominal pain.  GENITOURINARY: Denies dysuria, hematuria or  renal calculi. GYNECOLOGIC AND BREASTS: No breast mass or vaginal discharge. ENDOCRINE: No polyuria, nocturia or thyroid problems.  HEMATOLOGIC/LYMPHATIC: Denies anemia or easy bruising. INTEGUMENTARY: No acne, rash or lesions.  MUSCULOSKELETAL: Denies any gout, swelling. NEUROLOGIC: Denies any vertigo, ataxia or dementia.  PSYCH: Positive insomnia. Denies ADD or OCD.   PHYSICAL EXAMINATION:  VITAL SIGNS: Initial blood pressure was 200/55 probably because of the pain. After giving pain medication, the patient's blood pressure was at 140/56 with pulse 76, respirations 18 and the patient was satting 96% on room air. Temperature was not recorded.  GENERAL APPEARANCE: Not in acute any distress, moderately built and moderately nourished.  HEENT: Normocephalic, atraumatic. Pupils are equally reactive to light and accommodation. No scleral icterus. No sinus tenderness. No postnasal drip.  NECK: Supple. No JVD. No thyromegaly.  LUNGS: Clear to auscultation bilaterally. No anterior chest wall tenderness on palpation.  CARDIOVASCULAR: S1, S2 normal. Regular rate and rhythm.  GASTROINTESTINAL: Soft. Bowel sounds are positive in  all 4 quadrants. Obese, nontender, nondistended. No masses.  NEUROLOGIC: Awake, alert and  oriented x 3.  EXTREMITIES: Left ankle is placed in immobilizer. Open wound is covered with a bandage. No clubbing and no cyanosis is noticed.  SKIN: No rashes. No lesions.  PSYCHIATRIC: Normal mood and affect.  LABORATORIES AND IMAGINGT STUDIES: Glucose 137, BUN 36, creatinine 1.21, sodium 139, potassium 5.1, chloride 106, CO2 26. GFR is 51. Serum osmolality 280. Anion gap is 7. Serum calcium is 8.9. WBC 17.8, hemoglobin 10.7, hematocrit 33.4, platelet count 303. Prothrombin 12.4, INR 0.9. Chest x-ray has revealed possible left upper lobe interstitial change versus infiltrate.   ASSESSMENT AND PLAN:  1.  Coronary artery disease status post stent in the year 2000 and also the patient had cardiac catheter done 2 years ago, which was normal. She is asymptomatic currently without any chest pain or shortness of breath and the EKG is within normal limits. We will medically optimize her from cardiac standpoint for noncardiac surgery.  2.  Hypertension. As the patient is n.p.o., we will give her Lopressor 2.5 mg IV with holding parameters.  3.  Hyperlipidemia. The patient's statin was held as she is getting investigated for possible rheumatoid arthritis by her primary care physician.  4.  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 We will start her on empiric antibiotic Levaquin. The patient is clinically asymptomatic.  5.  Probable rheumatoid arthritis. The patient is on prednisone 5 mg and getting workup done by the primary care physician. We will hold off on the prednisone.  6.  Open left ankle fracture. Pain management by Dr. Hyacinth MeekerMiller. We will provide her gastrointestinal prophylaxis.  7.  As the patient is getting workup done for rheumatoid arthritis, anesthesia to rule out atlantoaxial subluxation if needed.   TOTAL TIME SPENT ON MEDICAL CONSULTATION: 45 minutes.   Thank you, Dr. Hyacinth MeekerMiller for allowing hospitalist team to take care of the patient  medically.    ____________________________ Ramonita LabAruna Donyel Castagnola, MD ag:aw D: 05/10/2012 23:46:18 ET T: 05/11/2012 11:06:04 ET JOB#: [id number]  cc: Ramonita LabAruna Melika Reder, MD, <Dictator> Rhona LeavensJames F. Burnett ShengHedrick, MD Ramonita LabARUNA Efstathios Sawin MD ELECTRONICALLY SIGNED 05/13/2012 6:58

## 2014-07-16 NOTE — Discharge Summary (Signed)
PATIENT NAME:  [name] Cooper, [name] Cooper MR#:  [id number] DATE OF BIRTH:  [birth year]  DATE OF ADMISSION:  08/04/2012 DATE OF DISCHARGE:  08/06/2012  PRIMARY CARE PHYSICIAN: Tera MaterJim Hedrick.   DISCHARGE DIAGNOSES: 1.  Severe thrombocytopenia secondary to Bactrim.  2.  Methicillin-resistant Staphylococcus aureus infection with cellulitis of left lower extremity.   CONSULTS: Dr. Leavy CellaBlocker of infectious disease, Dr. Orlie DakinFinnegan of hematology and Dr. Hyacinth MeekerMiller of orthopedics.   IMAGING STUDIES: None.   ADMITTING HISTORY AND PHYSICAL: Please see detailed H and P dictated by Dr. Mordecai MaesSanchez on 08/04/2012. In brief, this is a 74 year old African American female patient with history of recent tibiofibular fracture with open reduction internal fixation who had a MRSA infection at the surgical site being treated with Bactrim, who presented to the hospital with diffuse rash on her arms and legs. The patient was found to have diffuse petechial rash with platelets of 3000 and was admitted to the hospitalist service for further workup and treatment.   HOSPITAL COURSE:  Severe thrombocytopenia. The patient's thrombocytopenia was thought to be likely secondary to her Bactrim medication which was started for MRSA.  The Bactrim was stopped. The patient was switched to IV vancomycin. Dr. Leavy CellaBlocker of infectious disease was consulted, who suggested changing her to p.o. doxycycline. Dr. Hyacinth MeekerMiller of orthopedics was consulted to see if her foreign object near the surgical site was infected. Dr. Hyacinth MeekerMiller did not feel like the foreign body was infected at that time, only cellulitis was suggested. Dr. Leavy CellaBlocker has started patient on doxycycline b.i.d. She will follow up as outpatient with Dr. Hyacinth MeekerMiller and Dr. Leavy CellaBlocker. If her infection does get worse, the foreign body needs to be removed and treated for 6 weeks with IV antibiotics. Presently, the patient is afebrile and is being discharged home in fair condition. She does have an ulcer in the left lower  extremity, the surgical site, on exam without any purulent discharge, will follow up with wound clinic.   She received 2 units of platelets during the hospital stay. Platelets are much improved at 69 with the petechial rash improving. No bleeding. She will have a lab draw for her platelets on Friday, 08/08/2012, at Dr. Milinda CaveFinnegan's office and later follow up with him in a week. Marland Kitchen.   DISCHARGE MEDICATIONS: 1.  Hydrochlorothiazide/losartan 25/100, 1 tablet oral once a day.  2.  Ambien 5 mg oral once a day.  3.  Doxycycline 100 mg oral 2 times a day, 15-day supply given.   DISCHARGE INSTRUCTIONS: Low-sodium diet. Activity as tolerated. Follow up with Dr. Leavy CellaBlocker, her primary care physician, Dr. Tera MaterJim Hedrick, and Dr. Orlie DakinFinnegan in 1 to 2 weeks. The patient will continue seeing wound clinic.   TIME SPENT: Time spent on day of discharge in discharge activity was 38 minutes.   ____________________________ Molinda BailiffSrikar R. Andrienne Havener, MD srs:cs D: 08/06/2012 16:03:00 ET T: 08/06/2012 19:18:24 ET JOB#: [id number]  cc: Wardell HeathSrikar R. Elida Harbin, MD, <Dictator> Rhona LeavensJames F. Burnett ShengHedrick, MD Tollie Pizzaimothy J. Orlie DakinFinnegan, MD Rosalyn GessMichael E. Blocker, MD  Orie FishermanSRIKAR R Laquenta Whitsell MD ELECTRONICALLY SIGNED 08/25/2012 23:25

## 2014-07-16 NOTE — Consult Note (Signed)
History of Present Illness:  Reason for Consult Thrombocytopenia.   HPI   Patient is a 74 year old female who in February prescription on ice requiring surgical repair of her left leg.  She subsequently developed a MRSA infection that was treated with Bactrim.  Over the past 1-2 weeks, patient noticed significant bruising, rectal bleeding, and a petechial rash mainly on her legs.  Further evaluation revealed a platelet count of 3.  Currently, she feels well.  She denies any further bleeding.  She has no neurologic complaints.  She has had no recent fevers.  She has no chest pain or shortness of breath.  She has a good appetite and denies weight loss.  She denies any nausea, vomiting, constipation, or diarrhea.  She has no urinary complaints.  Patient otherwise feels well and offers no further specific complaints.  PFSH:  Additional Past Medical and Surgical History CAD, MRSA, frozen shoulder status post surgical repair, kidney stones, gallstones, hypertension, ORIF of left lower leg, cervical spine decompression.  Family history: CAD.  Social history: Patient denies tobacco or alcohol.   Review of Systems:  Performance Status (ECOG) 0   Review of Systems   As per HPI. Otherwise, 10 point system review was negative.   NURSING NOTES: **Vital Signs.:   13-May-14 05:14   Vital Signs Type: Routine   Temperature Temperature (F): 97.9   Celsius: 36.6   Pulse Pulse: 67   Respirations Respirations: 20   Systolic BP Systolic BP: 989   Diastolic BP (mmHg) Diastolic BP (mmHg): 72   Mean BP: 98   Pulse Ox % Pulse Ox %: 94   Physical Exam:  Physical Exam General: Well-developed, well-nourished, no acute distress. Eyes: Pink conjunctiva, anicteric sclera. HEENT: Normocephalic, moist mucous membranes, clear oropharnyx. Lungs: Clear to auscultation bilaterally. Heart: Regular rate and rhythm. No rubs, murmurs, or gallops. Abdomen: Soft, nontender, nondistended. No organomegaly  noted, normoactive bowel sounds. Musculoskeletal: No edema, cyanosis, or clubbing. Neuro: Alert, answering all questions appropriately. Cranial nerves grossly intact. Skin: multiple ecchymosis all over and petechiael rash noted mainly on lower extremity. Psych: Normal affect.    No Known Allergies:     hydrochlorothiazide-losartan 25 mg-100 mg oral tablet: 1 tab(s) orally once a day, Status: Active, Quantity: 0, Refills: None   Ambien 5 mg oral tablet: 1 tab(s) orally once a day (at bedtime), Status: Active, Quantity: 0, Refills: None  Laboratory Results: Hepatic:  13-May-14 05:18   Bilirubin, Total 0.2  Alkaline Phosphatase 114  SGPT (ALT) 15  SGOT (AST) 24  Total Protein, Serum 7.5  Albumin, Serum  3.0  Routine Chem:  13-May-14 05:18   Glucose, Serum  167  BUN  23  Creatinine (comp)  1.92  Sodium, Serum 137  Potassium, Serum 4.3  Chloride, Serum  108  CO2, Serum 23  Calcium (Total), Serum 9.2  Osmolality (calc) 281  eGFR (African American)  30  eGFR (Non-African American)  26 (eGFR values <98m/min/1.73 m2 may be an indication of chronic kidney disease (CKD). Calculated eGFR is useful in patients with stable renal function. The eGFR calculation will not be reliable in acutely ill patients when serum creatinine is changing rapidly. It is not useful in  patients on dialysis. The eGFR calculation may not be applicable to patients at the low and high extremes of body sizes, pregnant women, and vegetarians.)  Anion Gap  6  Magnesium, Serum  1.6 (1.8-2.4 THERAPEUTIC RANGE: 4-7 mg/dL TOXIC: > 10 mg/dL  -----------------------)  Routine Hem:  13-May-14 05:18  Manual Diff MANUAL DIFF DONE  Result(s) reported on 05 Aug 2012 at 05:59AM.  WBC (CBC) 7.0  RBC (CBC)  3.19  Hemoglobin (CBC)  8.5  Hematocrit (CBC)  25.6  Platelet Count (CBC)  39  MCV 80  MCH 26.8  MCHC 33.4  RDW 13.2  Neutrophil % 88.6  Lymphocyte % 9.7  Monocyte % 1.6  Eosinophil % 0.0  Basophil %  0.1  Neutrophil # 6.2  Lymphocyte #  0.7  Monocyte #  0.1  Eosinophil # 0.0  Basophil # 0.0 (Result(s) reported on 05 Aug 2012 at 06:17AM.)  Segmented Neutrophils 88  Lymphocytes 9  Variant Lymphocytes 1  Monocytes 2  Diff Comment 1 ANISOCYTOSIS  Diff Comment 2 HYPOCHROMIA  Diff Comment 3 PLTS VARIED IN SIZE  Result(s) reported on 05 Aug 2012 at 06:17AM.   Assessment and Plan: Impression:   Thrombocytopenia. Plan:   1.  Thrombocytopenia:  Improved with transfusion and discontinuation of Bactrim.  There is no evidence of hemolytic anemia on her peripheral smear, therefore TTP is highly unlikely.  Bactrim has a half-life of 8-10 hours, but this may be prolonged with her mild renal insufficiency. Recommend transfusing platelets to maintain count above 20.  She does not require a transfusion at this time.  She does not require a bone marrow biopsy at this time either, but if no improvement would consider one in the near future.  Antiplatelet antibodies have been ordered for completeness. follow.  Electronic Signatures: Delight Hoh (MD)  (Signed 13-May-14 12:58)  Authored: HISTORY OF PRESENT ILLNESS, PFSH, ROS, NURSING NOTES, PE, ALLERGIES, HOME MEDICATIONS, LABS, ASSESSMENT AND PLAN   Last Updated: 13-May-14 12:58 by Delight Hoh (MD)

## 2014-07-16 NOTE — Consult Note (Signed)
Patient's platelet count is now improved to 62.  No further transfusions are necessary.  Okay from a hematology standpoint to discharge.  If patient is discharged today, [facility] for lab only and then in ~1 week for further evaluation to ensure resolution of her thrombocytopenia. consult, call questions.  Electronic Signatures: Gerarda FractionFinnegan, Prestin Munch (MD)  (Signed on 14-May-14 10:11)  Authored  Last Updated: 14-May-14 10:11 by Gerarda FractionFinnegan, Natina Wiginton (MD)

## 2014-07-16 NOTE — Discharge Summary (Signed)
PATIENT NAME:  [name] Cooper, [name] Cooper MR#:  [id number] DATE OF BIRTH:  1940/06/29  DATE OF ADMISSION:  05/10/2012 DATE OF DISCHARGE:  05/14/2012  FINAL DIAGNOSES: 1. Open trimalleolar fracture, left ankle. 2. Inflammatory arthropathy. 3. Coronary artery disease.  4. Hypertension.  5. Hyperlipidemia.   OPERATION:  On 05/11/2012 open reduction and internal fixation of left trimalleolar ankle fracture.   COMPLICATIONS: None.   CONSULTATIONS: [facility].   DISCHARGE MEDICATIONS: 1. Norco 7.5/325 mg p.r.n. pain.  2. Calcium with vitamin D twice a day.  3. Hydrochlorothiazide/losartan 1 daily. 4. Fosamax 70 mg weekly.  5. Cipro 250 mg q. 12 hours for 1 week.  6. Protonix 40 mg daily.  7. Prednisone 5 mg daily.  8. Senna p.r.n. constipation.  HISTORY OF PRESENT ILLNESS: The patient is a 74 year old female who went out and slipped on the ice going out to get her mail, on the night of 05/10/2012. She was brought to the Emergency Room where exam and x-ray showed a displaced trimalleolar fracture-dislocation of the ankle. She had very minimal punctate wound medially with some blood that was seen. She was placed on IV antibiotics and scheduled for surgery. The joint was infiltrated with Xylocaine and reduced and splinted preoperatively by myself as well. She was seen in consultation by [facility] and cleared for surgery.   PAST MEDICAL HISTORY: Illnesses:  As above.  Operations: Gallbladder, cardiac stent.   ALLERGIES: No known drug allergies.  REVIEW OF SYSTEMS:  Unremarkable.   FAMILY HISTORY: Unremarkable.  SOCIAL HISTORY: The patient does not smoke or drink. She lives with her husband who has dementia.   PHYSICAL EXAMINATION: The patient was alert and cooperative. The left lower leg showed a displaced ankle fracture. There was minimal punctate wound medially with some bleeding. Neurovascular status was good.   LABORATORY DATA: On admission was satisfactory.   HOSPITAL COURSE: The patient  was placed on IV antibiotics and scheduled for surgery. She was taken to surgery after overnight observation and underwent open reduction internal fixation of the fracture. She did well postoperatively. Dressings were changed on postop day 2 and a short-leg cast applied. The wounds were benign. She complained more of soreness over a bruise on the right tibia. X-rays of the right leg were normal. She had slow progress with therapy because both legs were injured, but she remained afebrile. She is to be discharged today, 05/14/2012, to rehab. She is to remain nonweightbearing on the left leg. She will return to my office in 10 days for wound check and x-rays. ____________________________ Valinda HoarHoward E. Reiley Keisler, MD hem:sb D: 05/14/2012 12:01:34 ET T: 05/14/2012 12:39:32 ET JOB#: [id number]  cc: Valinda HoarHoward E. Owain Eckerman, MD, <Dictator> Rhona LeavensJames F. Burnett ShengHedrick, MD Valinda HoarHOWARD E Trebor Galdamez MD ELECTRONICALLY SIGNED 05/15/2012 12:48

## 2014-07-16 NOTE — H&P (Signed)
PATIENT NAME:  [name] Cooper, [name] Cooper MR#:  [id number] DATE OF BIRTH:  1941-03-20  DATE OF ADMISSION:  08/04/2012  CHIEF COMPLAINT:   Rash, diffuse, all over her body.    REFERRING PHYSICIAN:  Dr. Governor Rooks.  PRIMARY CARE PHYSICIAN:  Dr. Burnett Sheng.   ORTHOPEDIC PHYSICIAN:  Dr. Martha Clan.   HISTORY OF PRESENT ILLNESS:  The patient is a 74 year old female with history of hypertension and status post tibial and fibular fracture back in February after slipping on a piece of ice. The patient underwent an open reduction and internal fixation, and since then she has been walking in a [employer]. The patient had some drainage of one of these incisions and was sent for culture. The culture grew out MRSA sensitive for trimethoprim/sulfamethoxazole for which the patient was put on Bactrim about 1-1/2 weeks ago.   Today, the patient woke up in the middle of the morning and had diffuse rash, which was pinpoint, purple color, mostly petechial all over her legs, arms and some part of her chest. The patient came to the ER, was evaluated, and was found to have a platelets of 3.   ER physician contacted Dr. Sueanne Margarita, who said he is going to come and check on her later on this evening.  For now recommended some workup and platelet transfusion.   The patient states that she has not been having any profuse bleeding, but she had some blood in the stool about 2 days ago. There is no bleeding when she brushes her teeth. There is no vaginal bleeding. No hemoptysis or epistaxis.   REVIEW OF SYSTEMS:   CONSTITUTIONAL: No fever. No fatigue. No weakness. No weight loss or weight gain.  EYES: No blurry vision, double vision or cataracts.  EARS, NOSE, THROAT: No tinnitus. No epistaxis. No postnasal drip. No difficulty swallowing.  RESPIRATORY: No cough. No wheezing. No hemoptysis. No painful respirations.  CARDIOVASCULAR: No chest pain, orthopnea or syncope.  GASTROINTESTINAL: No nausea, vomiting or diarrhea. Mild blood in the  stool 2 days ago, a couple of drops. No melena. No jaundice.  GENITOURINARY: No dysuria, hematuria or frequency.  GYNECOLOGIC: No breast masses. No vaginal bleeding.  ENDOCRINOLOGY: No polyuria, polydipsia or polyphagia. Thyroid: No cold or heat intolerance.  HEMATOLOGIC AND LYMPHATIC: Positive anemia. Positive easy bruising. No swollen glands.  SKIN: Positive petechial rash.  MUSCULOSKELETAL: Positive fracture of the left tib-fib, now infected with MRSA.  NEUROLOGIC: No numbness, tingling. No ataxia. No CVA.  PSYCHIATRIC: No insomnia. No nervousness.   PAST MEDICAL HISTORY: 1.  Coronary artery disease, status post 3 stents.  2.   MRSA on wound of the left lower extremity.  3.  Frozen shoulder.  4.  Kidneys stones.  5.  Gallbladder stones.  6.  Hypertension.   ALLERGIES: No known drug allergies.   PAST SURGICAL HISTORY:  1.  Open reduction and internal fixation of the left lower extremity tib-fib in February, 3 stent placements.  2.  Surgery for frozen left shoulder.  3.  Cervical spine decompression.   FAMILY HISTORY: Positive for heart attacks in her mother, brother and father. Her brother had 1 bad MI in his 59s. No cancer. No diabetes.   SOCIAL HISTORY: Does not smoke, does not drink. She is retired. Lives at home with her husband, who is demented.   CURRENT MEDICATIONS: Include hydrochlorothiazide with losartan once daily, Bactrim 2 times daily  and Ambien 5 mg at bedtime.   PHYSICAL EXAMINATION: VITAL SIGNS: The patient is alert, oriented x3. No  acute distress. No respiratory distress. Blood pressure 174/61, pulse 80, respirations 20, temperature 98.5, 100% on room air.  GENERAL: Alert, oriented x 3. No acute distress. No respiratory distress. No bleeding of any source actively right now.  HEENT: Pupils are equal and reactive. Extraocular movements are intact. Mucosa is moist. Anicteric sclerae. Pink conjunctivae. No oral lesions.  NECK: Supple. No JVD. No thyromegaly. No  adenopathy. No carotid bruits. No rigidity.  CARDIOVASCULAR: Regular rate and rhythm. No murmurs, rubs or gallops. No displacement of PMI. No tenderness to palpation of anterior chest wall.  LUNGS: Clear without any wheezing or crepitus. No use of accessory muscles.  ABDOMEN: Soft, nontender, nondistended. No hepatosplenomegaly. No masses. Bowel sounds are positive. No rebound.  GENITAL: Deferred.  EXTREMITIES: Positive mild edema of the left lower extremity, which is chronic, related to previous fracture.  There is wound with some leakage on the medial aspect of the leg, which is known to be infected with MRSA.  SKIN: With multiple petechiae and bruising all over lower extremities and upper extremities and part of the trunk. No petechiae  on the back that I can see right now. No other rashes.  MUSCULOSKELETAL: As mentioned above, left lower extremity with swelling and status post open reduction and internal fixation of the tib-fib.  NEUROLOGIC: Cranial nerves II through XII intact. The patient is alert and oriented x 3. There are no focal findings. Strength is 5/5 for 4 extremities.   PSYCHIATRIC: Mood is normal without any signs of depression.   LYMPHATIC: Negative for lymphadenopathy in the neck or supraclavicular areas.   RESULTS: As mentioned above, wound culture positive for MRSA. Creatinine is 2.07, prior creatinine is 1.24; BUN is 24, blood sugar 91. GFR is around 24. LDH 255. Haptoglobin is pending. LFTs within normal limits. White count is 9, hemoglobin is 9.9, previous to that in February was 9.8; platelet count is 3. Manual differential normal morphology of platelets. Red blood cells appear normal, so no schistocytes.   ASSESSMENT AND PLAN:  Severe thrombocytopenia. The patient has a history of normal platelets 2 months ago for which is very unlikely for this to be idiopathic thrombocytopenia purpura. She does not have schistocytes, no altered mental status. She has slight kidney failure,  but this is not likely to be thrombocytopenia purpura either. The patient had a recent prescription of sulfonamides with trimethoprim/sulfamethoxazole, which is a drug that could produce significant thrombocytopenia, for which this is likely to be a drug-related reaction. Her bilirubin is normal. Her LDH is increased, pending haptoglobin. No signs of blood hemolysis. At this moment, I am going to start her on steroids and transfuse platelets. Consultation with hematology/oncology, Dr. Sueanne Margarita.   No signs of bleeding. Transfuse until platelets are at least above 10,000 or 15,000.  2.  Status post tib-fib infection with methicillin-resistant Staphylococcus aureus.  We are going to stop Bactrim and start him on vancomycin right now. Infectious disease consult for evaluation of possible treatments for the future.  3.  Hypertension, stable. Hold losartan and hydrochlorothiazide, as the patient is in acute kidney injury.  4.  Acute kidney injury. Intravenous fluids given. Follow up in the morning. Stop potential nephrotoxins.  5.  Other medical problems are stable.  Deep vein thrombosis prophylaxis: I am not going to put  PLCs or compressors because of the significant thrombocytopenia, and also cannot have prophylaxis or chemoprophylaxis based on her low platelets.   CODE STATUS: The patient is a full code.  TIME SPENT: I  spent about 50 minutes with this patient.    ____________________________ Felipa Furnaceoberto Sanchez Gutierrez, MD rsg:dmm D: 08/04/2012 19:58:53 ET T: 08/04/2012 21:16:19 ET JOB#: [id number]  cc: Felipa Furnaceoberto Sanchez Gutierrez, MD, <Dictator> Appollonia Klee Juanda ChanceSANCHEZ GUTIERRE MD ELECTRONICALLY SIGNED 08/07/2012 22:19

## 2014-07-16 NOTE — Op Note (Signed)
PATIENT NAME:  [name] Cooper, [name] Cooper MR#:  [id number] DATE OF BIRTH:  03-11-1941  DATE OF PROCEDURE:  05/11/2012  PREOPERATIVE DIAGNOSIS:   Open trimalleolar fracture-dislocation of left ankle.   POSTOPERATIVE DIAGNOSIS:  Open trimalleolar fracture-dislocation of left ankle.   PROCEDURE PERFORMED: Open reduction and internal fixation, left trimalleolar ankle fracture (nine-hole Synthes locking plate laterally and two 4.0 cannulated screws medially).   SURGEON:  Valinda HoarHoward E. Inigo Lantigua, M.D.   ANESTHESIA:  General endotracheal.   COMPLICATIONS:  None.   DRAINS:  None.   ESTIMATED BLOOD LOSS:  Minimal.  Replaced:  None.   DESCRIPTION OF PROCEDURE: The patient was brought to the operating room where she underwent satisfactory general endotracheal anesthesia in the supine position. She was noted to have a very small punctate wound medially and had been given IV antibiotics overnight. The leg was prepped and draped in a sterile fashion an Esmarch applied. The tourniquet was inflated to 350 mmHg. Tourniquet time was 83 minutes. Medial side was opened initially, excising the small punctate wound. The wound was thoroughly irrigated with saline and GU antibiotic. There was no debris or contamination. A long lateral incision was then made and dissection carried out bluntly through subcutaneous tissue. The lateral border of the fibula was exposed, protecting the superficial peroneal nerve anteriorly. The fracture was reduced and held with a clamp. There was extensive comminution going proximally, so I elected to use a nine-hole plate. We used a Synthes small fragment locking plate, which was bent to fit the contour of the fibula. This was fixed with multiple locking and cortical screws after an anterior to posterior compression screw was put in place. This fixed the lateral side well and reduced the joint. Fluoroscopy showed these to be in excellent position. This wound was then irrigated and closed with 2-0 Vicryl and  staples. The medial side was reapproached and pulse lavage irrigation again carried out. The fracture was reduced and stabilized with 2 pins.  Two 4.0 x 50 mm cannulated screws were inserted, fixing the medial side snugly. There was a small fragment of cortical free bone, which was excised. The fluoroscopy showed all hardware to be in good position, along with the joint alignment. The medial side was irrigated and closed with 3-0 Vicryl and staples.  Marcaine 0.5% was placed in the wound. Dry sterile dressing with posterior splint was applied. The tourniquet was deflated with good return of blood flow to the foot. The patient was awakened and taken to recovery in good condition.    ____________________________ Valinda HoarHoward E. Wilmont Olund, MD hem:dm D: 05/11/2012 15:32:07 ET T: 05/12/2012 09:37:22 ET JOB#: [id number]  cc: Valinda HoarHoward E. Othniel Maret, MD, <Dictator> Valinda HoarHOWARD E Nora Sabey MD ELECTRONICALLY SIGNED 05/12/2012 18:30

## 2014-07-16 NOTE — Consult Note (Signed)
PATIENT NAME:  [name] Cooper, [name] MR#:  [id number] DATE OF BIRTH:  09/06/1940  INFECTIOUS DISEASES VISIT  DATE OF CONSULTATION:  08/04/2012  REFERRING PHYSICIAN:  Dr. Elpidio AnisSudini  CONSULTING PHYSICIAN:  Rosalyn GessMichael E. Wyn Nettle, MD  REASON FOR CONSULTATION: Possible infected orthopedic hardware in a patient with a drug reaction and thrombocytopenia.   HISTORY OF PRESENT ILLNESS: The patient is a 74 year old female with a past history significant for a recent left tibia-fibular fracture in February 2014, status post ORIF, who has had persistent drainage from the wound.   The patient has reports having had MRSA cultured from the wound, AND HAS BEEN ON TRIMETHOPRIM/SULFAMETHOXAZOLE FOR THE LAST WEEK OR TWO. SHE DEVELOPED A RASH, WITH MULTIPLE-SIZED RED LESIONS OVER HER ARMS, AND LEGS, AND CHEST. She  was also having some fevers and chills.   She was brought to the Emergency Room and found to have a platelet count of 3 and was admitted. She has received a platelet transfusion and her Bactrim has been stopped. She was started on vancomycin. Per orthopedics, she is too soon to have the hardware removed.   The patient currently is feeling well. She does not have any further fevers, chills, or sweats. She has had no GI bleeding, specifically no hematochezia, coffee-ground emesis, or melena. She has had persistent drainage from several spots on the left leg. These have been ongoing since her surgery. She has been ambulating fairly well and been able to tolerate ambulation without her boot on.   ALLERGIES: None.   PAST MEDICAL HISTORY: 1.  Recent fracture of the tibial/fibula on the left, status post ORIF.  2.  Coronary artery disease.  3.  Nephrolithiasis.  4.  Gallstones.  5.  Hypertension.  6.  Status post cervical spine decompression.  7.  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   SOCIAL HISTORY: She lives at home with her husband. She does not smoke. She does not drink.   FAMILY HISTORY: Positive for  coronary artery disease in her mother, father and brother.   REVIEW OF SYSTEMS:  GENERAL: Some fevers and chills initially on presentation; these have resolved. No significant malaise, or fatigue.  HEENT: No headaches. No sinus congestions. No sore throat.  NECK: No stiffness. No swollen glands.  RESPIRATORY: No cough. No shortness of breath. No sputum production.  CARDIAC: No chest pains or palpitations. No peripheral edema.  GASTROINTESTINAL: No nausea, no vomiting, no abdominal pain, no change in her bowels.  GENITOURINARY: No change in her urine.  MUSCULOSKELETAL: She has had some pain and swelling of her distal leg on the left. She has 3 areas of ulceration that have been draining in the past. Her left leg is swollen more after she has been up on it and improves with keeping it elevated. The pain is not significantly changed. She has some occasional erythema with the swelling, but no lymphangitic streaking.  SKIN: No rashes other than the leg, as described above.  NEUROLOGIC: No focal weakness.  PSYCHIATRIC: No complaints.   All other systems are negative.   PHYSICAL EXAMINATION: VITAL SIGNS: T-max of 98.4, T-current of 97.9, pulse of 67, blood pressure 150/72; 94% on room air.  GENERAL: A 74 year old black female in no acute distress.  HEENT: Normocephalic, atraumatic. Pupils equal and reactive to light. Extraocular motion intact. Sclerae, conjunctivae, and lids without evidence for emboli or petechiae. Oropharynx shows no erythema or exudate. Teeth and gums are in fair condition.  NECK: Supple. Full range of motion. Midline trachea. No lymphadenopathy.  No thyromegaly.  LUNGS: Clear to auscultation bilaterally, with good air movement. No focal consolidation.  HEART: Regular rate and rhythm, without murmur, rub, or gallop.   ABDOMINAL EXAM: Soft, nontender, and nondistended. No hepatosplenomegaly. No hernias noted.  EXTREMITY EXAM: No evidence for tenosynovitis. Her left lower  extremity has 3 areas of ulceration. There is no significant purulent discharge. There was some bronzy erythema to the left lower leg. There was not significant edema, although the left foot was slightly larger than the right leg. There was no lymphangitic streaking. No hardware was visible from the wounds.  NEUROLOGIC: The patient was awake and interactive, moving all 4 extremities.  PSYCHIATRIC: Mood and affect appeared normal.   LABORATORY DATA: Shows a BUN of 23, creatinine 1.92, bicarbonate 23, anion gap of  6.   LFTs are unremarkable.   White count of 7.0 with a hemoglobin of 8.5, platelet count of 39, ANC of 6.2.   On admission her white count was 9.0 and her platelet count was 3; repeat platelet count a few hours later was 2.   There was no radiographic imaging available, although on April 5th she underwent a left lower extremity ultrasound which showed no evidence for a DVT.   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   RECOMMENDATIONS: 1.  AGREE WITH STOPPING TRIMETHOPRIM/SULFAMETHOXAZOLE. We will wait await for platelets to rebound.  2.  It is unclear at this time if hardware is infected. If so, optimal therapy would be to remove the hardware and provide 6 weeks of IV therapy. If unable to remove the hardware, then we may need to consider chronic suppressive therapy.  3.  I discussed with radiology about the best way of looking at the hardware and bone to determine if there is an infection present. None of their studies will be able to distinguish between healing bone and infection at this point. We will need to presume that the bone is infected.  4.  We will get a sed rate and a C-reactive protein.  5. We will DC the vancomycin and start doxycycline. We will plan on indefinite therapy. If her bone fuses then it  may be possible to stop the antibiotics, and if she worsens remove the hardware in the future. We will then give her IV therapy at that time. There is no benefit from IV therapy at this time. If the hardware is infected we are unlikely to be able to cure her without its removal.  6.  Ortho indicated that removal of the hardware would not be feasible at this time.   This is a high-level infectious disease consult. Thank you very much for involving me in  Ms Ourada's care.    ____________________________ Rosalyn Gess. Meisha Salone, MD meb:dm D: 08/05/2012 14:28:00 ET T: 08/05/2012 15:05:39 ET JOB#: [id number]  cc: Rosalyn Gess. Farouk Vivero, MD, <Dictator> Wynonna Fitzhenry E Fatou Dunnigan MD ELECTRONICALLY SIGNED 08/06/2012 15:08

## 2014-09-15 ENCOUNTER — Encounter: Payer: Self-pay | Admitting: Urology

## 2014-09-15 ENCOUNTER — Ambulatory Visit (INDEPENDENT_AMBULATORY_CARE_PROVIDER_SITE_OTHER): Payer: Medicare Other | Admitting: Urology

## 2014-09-15 ENCOUNTER — Ambulatory Visit
Admission: RE | Admit: 2014-09-15 | Discharge: 2014-09-15 | Disposition: A | Discharge status: Home / Self Care | Payer: Medicare Other | Source: Ambulatory Visit | Attending: Urology | Admitting: Urology

## 2014-09-15 VITALS — BP 145/72 | HR 60 | Resp 18 | Ht 67.0 in | Wt 192.9 lb

## 2014-09-15 DIAGNOSIS — N952 Postmenopausal atrophic vaginitis: Secondary | ICD-10-CM

## 2014-09-15 DIAGNOSIS — N390 Urinary tract infection, site not specified: Secondary | ICD-10-CM

## 2014-09-15 DIAGNOSIS — R312 Other microscopic hematuria: Secondary | ICD-10-CM | POA: Diagnosis not present

## 2014-09-15 DIAGNOSIS — R3129 Other microscopic hematuria: Secondary | ICD-10-CM

## 2014-09-15 LAB — URINALYSIS, COMPLETE
Bilirubin, UA: NEGATIVE
GLUCOSE, UA: NEGATIVE
KETONES UA: NEGATIVE
NITRITE UA: NEGATIVE
Protein, UA: NEGATIVE
SPEC GRAV UA: 1.015 (ref 1.005–1.030)
Urobilinogen, Ur: 0.2 mg/dL (ref 0.2–1.0)
pH, UA: 7.5 (ref 5.0–7.5)

## 2014-09-15 LAB — MICROSCOPIC EXAMINATION

## 2014-09-15 MED ORDER — ESTROGENS, CONJUGATED 0.625 MG/GM VA CREA: 1.0000 | TOPICAL_CREAM | Freq: Every day | VAGINAL | Status: DC

## 2014-09-15 NOTE — Progress Notes (Signed)
09/15/2014 11:36 AM   [name] Cooper [birth year] [id number]  Referring provider: [name], MD [address] [facility] [place], [place] [address]  Chief Complaint  Patient presents with  . Recurrent UTI    HPI: Mrs. [name] Cooper is a 74 year old AA female who presents today at the request of her PCP,  Dr. Burnett Sheng, for recurrent UTI's.  Reviewing her past records, she had 4 documented UTI's since September.  Her symptoms when having infections are frequency, urgency, dysuria, nocturia, suprapubic pain and cloudy urine.    She states she does not have any urinary symptoms when she has infections.    UTI history:    12/04/2013 +UCx E.coli    12/28/2013 +UCx E.coli    07/20/2014 +UCx E.coli    08/02/2014 +UCx Strep  She does not have any fevers, chills, nausea, vomiting or gross hematuria.  She may have a h/o kidney stones, but she is not completely sure of this.  She has not seen an urologist in the past.  She is not sexually active. She is not constipated or having diarrhea.     PMH: Past Medical History  Diagnosis Date  . Coronary artery disease   . Hyperlipidemia   . Arthritis   . Iron deficiency   . Renal insufficiency   . UTI (urinary tract infection)     Surgical History: Past Surgical History  Procedure Laterality Date  . Cardiac catheterization      stents placed in mid LAD  . Frozen shoulder    . Kidney stone surgery    . Cervical spine surgery    . Leg surgery      broke her leg   . Fetal blood transfusion  2014    Home Medications:    Medication List       This list is accurate as of: 09/15/14 11:36 AM.  Always use your most recent med list.               AMBIEN 10 MG tablet  Generic drug:  zolpidem  Take 10 mg by mouth at bedtime as needed.     aspirin EC 81 MG tablet  Take 1 tablet (81 mg total) by mouth daily.     CRESTOR 10 MG tablet  Generic drug:  rosuvastatin  Take 1 tablet (10 mg total) by mouth every other day.     diclofenac sodium 1 % Gel  Commonly known as:  VOLTAREN  Apply topically.     losartan-hydrochlorothiazide 100-25 MG per tablet  Commonly known as:  HYZAAR  take 1 tablet by mouth once daily        Allergies:  Allergies  Allergen Reactions  . Sulfa Antibiotics Anaphylaxis    Family History: Family History  Problem Relation Age of Onset  . Hypertension Mother   . Heart attack Mother   . Heart attack Father   . Heart disease Brother     h/o CABG  . Diabetes Brother   . Kidney disease Brother     Social History:  reports that she has never smoked. She has never used smokeless tobacco. She reports that she does not drink alcohol or use illicit drugs.  ROS: Urological Symptom Review  Patient is experiencing the following symptoms: Frequent urination Hard to postpone urination Burning/pain with urination Get up at night to urinate   Review of Systems  Gastrointestinal (upper)  : Indigestion/heartburn  Gastrointestinal (lower) : Negative for lower GI symptoms  Constitutional : Negative  for symptoms  Skin: Negative for skin symptoms  Eyes: Negative for eye symptoms  Ear/Nose/Throat : Negative for Ear/Nose/Throat symptoms  Hematologic/Lymphatic: Negative for Hematologic/Lymphatic symptoms  Cardiovascular : Negative for cardiovascular symptoms  Respiratory : Negative for respiratory symptoms  Endocrine: Negative for endocrine symptoms  Musculoskeletal: Negative for musculoskeletal symptoms  Neurological: Negative for neurological symptoms  Psychologic: Negative for psychiatric symptoms   Physical Exam: BP 145/72 mmHg  Pulse 60  Resp 18  Ht  (1.702 m)  Wt 192 lb 14.4 oz (87.499 kg)  BMI 30.21 kg/m2  Constitutional:  Alert and oriented, No acute distress. HEENT: [place] AT, moist mucus membranes.  Trachea midline, no masses. Cardiovascular: No clubbing, cyanosis, or edema. Respiratory: Normal respiratory effort, no increased work of  breathing. GI: Abdomen is soft, nontender, nondistended, no abdominal masses GU: No CVA tenderness. Atrophic external genitalia.  Normal urethral meatus. No urethral masses and/or tenderness. No bladder fullness or masses. Atrophic vaginal lesions or discharge. Normal rectal tone, no masses. Normal anus and perineum.  Skin: No rashes, bruises or suspicious lesions. Lymph: No cervical or inguinal adenopathy. Neurologic: Grossly intact, no focal deficits, moving all 4 extremities. Psychiatric: Normal mood and affect.  Laboratory Data: Results for orders placed or performed in visit on 09/15/14  Microscopic Examination  Result Value Ref Range   WBC, UA 11-30 (A) 0 -  5 /hpf   RBC, UA 0-2 0 -  2 /hpf   Epithelial Cells (non renal) 0-10 0 - 10 /hpf   Bacteria, UA Moderate (A) None seen/Few  Urinalysis, Complete  Result Value Ref Range   Specific Gravity, UA 1.015 1.005 - 1.030   pH, UA 7.5 5.0 - 7.5   Color, UA Yellow Yellow   Appearance Ur Clear Clear   Leukocytes, UA 1+ (A) Negative   Protein, UA Negative Negative/Trace   Glucose, UA Negative Negative   Ketones, UA Negative Negative   RBC, UA Trace (A) Negative   Bilirubin, UA Negative Negative   Urobilinogen, Ur 0.2 0.2 - 1.0 mg/dL   Nitrite, UA Negative Negative   Microscopic Examination See below:    Lab Results  Component Value Date   WBC 9.5 12/28/2013   HGB 10.9* 12/28/2013   HCT 34.0* 12/28/2013   MCV 85 12/28/2013   PLT 310 12/28/2013    Lab Results  Component Value Date   CREATININE 1.47* 12/28/2013    No results found for: PSA  No results found for: TESTOSTERONE  No results found for: HGBA1C  Urinalysis No results found for: COLORURINE, APPEARANCEUR, LABSPEC, PHURINE, GLUCOSEU, HGBUR, BILIRUBINUR, KETONESUR, PROTEINUR, UROBILINOGEN, NITRITE, LEUKOCYTESUR  Pertinent Imaging: CLINICAL DATA: Or urinary tract infection.  EXAM: ABDOMEN - 1 VIEW  COMPARISON: [facility] image for CT scan dated  12/28/2013  FINDINGS: Bowel gas pattern is normal. Small calcifications in the mid right kidney, unchanged. Calcifications in the uterus, also unchanged.  No acute osseous abnormality. Previous bone graft harvesting from the right ilium.  IMPRESSION: No acute abnormality. Stable calcifications in the mid right kidney.   Electronically Signed  By: Francene Boyers M.D.  On: 09/15/2014 14:02  Assessment & Plan:    1. Recurrent UTI's:  She had 4 documented UTI's since September 2015.  We have sent a cath specimen for culture and obtained a KUB to look for stones.    UTI history:    12/04/2013 +UCx E.coli    12/28/2013 +UCx E.coli    07/20/2014 +UCx E.coli    08/02/2014 +UCx Strep  - Urinalysis,  Complete - CULTURE, URINE COMPREHENSIVE  2. Microscopic hematuria:   Patient had micro heme with the UTI's.  We will continue to monitor to see if hematuria persists once the infections are cleared.  If the hematuria does persist, we will pursue a CT Urogram/cystoscopy.  She will follow up in 2 weeks for a cath specimen.    3. Atrophic vaginitis:   Patient was given a sample of vaginal estrogen cream (Premarin) and instructed to apply 0.5mg  (pea-sized amount)  just inside the vaginal introitus with a finger-tip every night for two weeks and then Monday, Wednesday and Friday nights.  I explained to the patient that vaginally administered estrogen, which causes only a slight increase in the blood estrogen levels, have fewer contraindications and adverse systemic effects that oral HT.  She will follow up in 2 weeks for reexamination.     No Follow-up on file.  Michiel Cowboy, PA-C  [facility] [address], [address] [place], [place] [address] [phone]

## 2014-09-15 NOTE — Progress Notes (Signed)
In and Out Catheterization  Patient is present today for a I & O catheterization for a clean catch UA for recurrent uti. Patient was cleaned and prepped in a sterile fashion with betadine.  A 14FRcath with jelly on the end was inserted no complications were noted , 35ml of urine return was noted, urine was Lt. yellow in color. A clean urine sample was collected for culture also. Bladder was drained  and catheter was removed with out difficulty.    Preformed by: Dallas Schimke CMA

## 2014-09-16 DIAGNOSIS — N952 Postmenopausal atrophic vaginitis: Secondary | ICD-10-CM | POA: Insufficient documentation

## 2014-09-16 DIAGNOSIS — N390 Urinary tract infection, site not specified: Secondary | ICD-10-CM | POA: Insufficient documentation

## 2014-09-16 DIAGNOSIS — R3129 Other microscopic hematuria: Secondary | ICD-10-CM | POA: Insufficient documentation

## 2014-09-17 ENCOUNTER — Other Ambulatory Visit: Payer: Self-pay | Admitting: Urology

## 2014-09-17 DIAGNOSIS — R319 Hematuria, unspecified: Principal | ICD-10-CM

## 2014-09-17 DIAGNOSIS — N390 Urinary tract infection, site not specified: Secondary | ICD-10-CM

## 2014-09-17 LAB — CULTURE, URINE COMPREHENSIVE

## 2014-09-17 MED ORDER — AMOXICILLIN-POT CLAVULANATE 875-125 MG PO TABS
1.0000 | ORAL_TABLET | Freq: Two times a day (BID) | ORAL | Status: DC
Start: 2014-09-17 — End: 2014-10-26

## 2014-09-20 ENCOUNTER — Telehealth: Payer: Self-pay

## 2014-09-20 NOTE — Telephone Encounter (Signed)
Spoke with pt in reference to abt. Pt stated she has been taking augmentin since she came in to give a urine sample. Pt refused a second round of abt. Pt is coming in 03/26/14 for cath specimen. Pt also c/o not being able to afford premarin cream. Can she have something else? Please advise. Cw,lpn

## 2014-09-20 NOTE — Telephone Encounter (Signed)
We can call in a compounded estrogen cream to [facility].

## 2014-09-20 NOTE — Telephone Encounter (Signed)
Medication was called into [facility]. [facility] will call pt when medication is ready. Cw,lpn

## 2014-09-20 NOTE — Telephone Encounter (Signed)
-----   Message from Harle BattiestShannon A McGowan, PA-C sent at 09/17/2014  5:54 PM EDT ----- Patient has a +UCx.  They need to start Augmentin 875/125 mg one tablet twice daily for seven days and then we need to check a cath specimen in 3 to 5 days after they complete their antibiotics.  I have e scribed their prescription [facility] [address]

## 2014-09-24 ENCOUNTER — Ambulatory Visit: Payer: Medicare Other

## 2014-09-24 DIAGNOSIS — B373 Candidiasis of vulva and vagina: Secondary | ICD-10-CM

## 2014-09-24 DIAGNOSIS — N390 Urinary tract infection, site not specified: Secondary | ICD-10-CM

## 2014-09-24 DIAGNOSIS — B3731 Acute candidiasis of vulva and vagina: Secondary | ICD-10-CM

## 2014-09-24 MED ORDER — FLUCONAZOLE 150 MG PO TABS: 150.0000 mg | ORAL_TABLET | Freq: Every day | ORAL | Status: AC

## 2014-09-24 NOTE — Progress Notes (Signed)
Patient present today for a recheck on UA for infection. She is post ab treatment and a cath specimen was done for culture recheck. It was noted that she had visible irritation and yeast infection. Per Carollee HerterShannon it was ok to send in Diflucan for patient to treat, patient was also given samples of premarin cream she will call back when she runs out and we will send a script to the compound pharmacy which will be more affordable for her.  In and Out Catheterization  Patient is present today for a I & O catheterization due to needing a clean catch cath specimen for urine recheck after abx. Patient was cleaned and prepped in a sterile fashion with betadine and Lidocaine 2% jelly was instilled into the urethra.  A 14FR cath was inserted no complications were noted , 50ml of urine return was noted, urine was yellow in color. A clean urine sample was collected for culture. Bladder was drained  And catheter was removed with out difficulty.    Preformed by: Eligha BridegroomSarah Watts, CMA   Follow up/ Additional notes: Will call patient with culture results

## 2014-09-27 LAB — CULTURE, URINE COMPREHENSIVE

## 2014-09-28 ENCOUNTER — Telehealth: Payer: Self-pay

## 2014-09-28 DIAGNOSIS — N390 Urinary tract infection, site not specified: Secondary | ICD-10-CM

## 2014-09-28 MED ORDER — AMOXICILLIN-POT CLAVULANATE 875-125 MG PO TABS
1.0000 | ORAL_TABLET | Freq: Two times a day (BID) | ORAL | Status: AC
Start: 2014-09-28 — End: 2014-10-05

## 2014-09-28 NOTE — Telephone Encounter (Signed)
-----   Message from Harle BattiestShannon A McGowan, PA-C sent at 09/28/2014  8:22 AM EDT ----- Patient has a +UCx.  They need to start Augmentin 875/125 one  twice daily for seven days and then we need to check a cath specimen in 3 to 5 days after they complete their antibiotics.

## 2014-09-28 NOTE — Telephone Encounter (Signed)
-----   Message from Shannon A McGowan, PA-C sent at 09/28/2014  8:22 AM EDT ----- Patient has a +UCx.  They need to start Augmentin 875/125 one  twice daily for seven days and then we need to check a cath specimen in 3 to 5 days after they complete their antibiotics. 

## 2014-09-28 NOTE — Telephone Encounter (Signed)
Spoke with pt in reference to infection. Pt stated that the augmentin has really "tore her up down there". Asked pt if she is using compounded cream. Pt stated no she did not know anything about it. Contacted [facility] pharmacy who stated they have not been able to get in touch with pt. Spoke with pt again, gave her [facility]'s number, and asked her to give them a call. Nurse reinforced the need of taking abt and using the cream. Reinforced with pt if she is not feeling better by Friday after taking both medications to call us back. Pt voiced understanding. Cw,lpn

## 2014-09-29 ENCOUNTER — Encounter: Payer: Self-pay | Admitting: Family Medicine

## 2014-09-29 ENCOUNTER — Ambulatory Visit: Payer: Medicare Other | Admitting: Urology

## 2014-10-12 ENCOUNTER — Ambulatory Visit (INDEPENDENT_AMBULATORY_CARE_PROVIDER_SITE_OTHER): Payer: Medicare Other

## 2014-10-12 ENCOUNTER — Other Ambulatory Visit: Payer: Self-pay | Admitting: *Deleted

## 2014-10-12 DIAGNOSIS — N390 Urinary tract infection, site not specified: Secondary | ICD-10-CM | POA: Diagnosis not present

## 2014-10-12 MED ORDER — LOSARTAN POTASSIUM-HCTZ 100-25 MG PO TABS: 1.0000 | ORAL_TABLET | Freq: Every day | ORAL | Status: DC

## 2014-10-12 NOTE — Progress Notes (Signed)
In and Out Catheterization  Patient is present today for a I & O catheterization due to post abt therapy. Patient was cleaned and prepped in a sterile fashion with betadine and Lidocaine 2% jelly was instilled into the urethra.  A 14FR cath was inserted no complications were noted , 50ml of urine return was noted, urine was clear and yellow in color. A clean urine sample was collected for cx. Bladder was drained  And catheter was removed with out difficulty.    Preformed by: Rupert Stackshelsea Shaquisha Wynn, LPN

## 2014-10-14 ENCOUNTER — Telehealth: Payer: Self-pay

## 2014-10-14 LAB — CULTURE, URINE COMPREHENSIVE

## 2014-10-14 NOTE — Telephone Encounter (Signed)
Spoke with pt who stated she rescheduled appt for August.

## 2014-10-14 NOTE — Telephone Encounter (Signed)
-----   Message from Harle Battiest, PA-C sent at 10/14/2014  8:34 AM EDT ----- Patient was a "No Show" on 09/29/2014.  She needs an appointment.

## 2014-10-26 ENCOUNTER — Ambulatory Visit (INDEPENDENT_AMBULATORY_CARE_PROVIDER_SITE_OTHER): Payer: Medicare Other | Admitting: Urology

## 2014-10-26 ENCOUNTER — Other Ambulatory Visit: Payer: Self-pay | Admitting: *Deleted

## 2014-10-26 ENCOUNTER — Encounter: Payer: Self-pay | Admitting: *Deleted

## 2014-10-26 VITALS — BP 158/73 | HR 64 | Ht 67.0 in | Wt 189.8 lb

## 2014-10-26 DIAGNOSIS — N390 Urinary tract infection, site not specified: Secondary | ICD-10-CM

## 2014-10-26 DIAGNOSIS — R3129 Other microscopic hematuria: Secondary | ICD-10-CM

## 2014-10-26 DIAGNOSIS — R312 Other microscopic hematuria: Secondary | ICD-10-CM | POA: Diagnosis not present

## 2014-10-26 DIAGNOSIS — N952 Postmenopausal atrophic vaginitis: Secondary | ICD-10-CM | POA: Diagnosis not present

## 2014-10-27 ENCOUNTER — Other Ambulatory Visit: Payer: Self-pay

## 2014-10-27 ENCOUNTER — Telehealth: Payer: Self-pay | Admitting: Urology

## 2014-10-27 DIAGNOSIS — R310 Gross hematuria: Secondary | ICD-10-CM

## 2014-10-27 NOTE — Progress Notes (Signed)
9:43 AM   [name] Cooper 11-22-1940 [id number]  Referring provider: [name], MD [address] [place], [place] [address]  Chief Complaint  Patient presents with  . Urinary Tract Infection    HPI: [name] Cooper is a 74 year old AA female who presents today after being placed on vaginal estrogen cream for atrophic vaginitis and recurrent UTI's.    Reviewing her past records, she had 4 documented UTI's since September.  Her symptoms when having infections are frequency, urgency, dysuria, nocturia, suprapubic pain and cloudy urine.    She states she does not have any urinary symptoms when she has infections.    UTI history:    12/04/2013 +UCx E.coli    12/28/2013 +UCx E.coli    07/20/2014 +UCx E.coli    08/02/2014 +UCx Strep    09/15/2014 +UCx E.coli    09/24/2014 +UCx E.coli    10/12/2014 -UCx no growth     She does not have any fevers, chills, nausea, vomiting or gross hematuria.  She may have a h/o kidney stones, but she is not completely sure of this.  She has not seen an urologist in the past.  She is not sexually active. She is not constipated or having diarrhea.  KUB taken on 09/15/2014 noted stable calcifications in the mid right kidney.  Today, she is without urinary tract symptoms.  She states she feels great.  She is using the vaginal estrogen cream, applying Monday, Wednesday and Friday nights.   PMH: Past Medical History  Diagnosis Date  . Coronary artery disease   . Hyperlipidemia   . Arthritis   . Iron deficiency   . Renal insufficiency   . UTI (urinary tract infection)   . Venous stasis   . Thrombocytopenia   . HTN (hypertension)   . History of kidney stones   . Cholelithiases   . Frozen shoulder     Surgical History: Past Surgical History  Procedure Laterality Date  . Cardiac catheterization      stents placed in mid LAD  . Frozen shoulder    . Kidney stone surgery    . Cervical spine surgery    . Leg surgery      broke her leg   .  Fetal blood transfusion  2014    Home Medications:    Medication List       This list is accurate as of: 10/26/14 11:59 PM.  Always use your most recent med list.               AMBIEN 10 MG tablet  Generic drug:  zolpidem  Take 10 mg by mouth at bedtime as needed.     aspirin EC 81 MG tablet  Take 1 tablet (81 mg total) by mouth daily.     conjugated estrogens vaginal cream  Commonly known as:  PREMARIN  Place 1 Applicatorful vaginally daily. Patient was given a sample of vaginal estrogen cream and instructed to apply 0.5mg  (pea-sized amount)  just inside the vaginal introitus with a finger-tip every night for two weeks and then Monday, Wednesday and Friday nights.     CRESTOR 10 MG tablet  Generic drug:  rosuvastatin  Take 1 tablet (10 mg total) by mouth every other day.     losartan-hydrochlorothiazide 100-25 MG per tablet  Commonly known as:  HYZAAR  Take 1 tablet by mouth daily.        Allergies:  Allergies  Allergen Reactions  . Sulfa Antibiotics Anaphylaxis  Family History: Family History  Problem Relation Age of Onset  . Hypertension Mother   . Heart attack Mother   . Heart attack Father   . Heart disease Brother     h/o CABG  . Diabetes Brother   . Kidney disease Brother     Social History:  reports that she has never smoked. She has never used smokeless tobacco. She reports that she does not drink alcohol or use illicit drugs.  ROS: Urological Symptom Review  Patient is experiencing the following symptoms: Frequent urination Hard to postpone urination Burning/pain with urination Get up at night to urinate   Review of Systems  Gastrointestinal (upper)  : Indigestion/heartburn  Gastrointestinal (lower) : Negative for lower GI symptoms  Constitutional : Negative for symptoms  Skin: Negative for skin symptoms  Eyes: Negative for eye symptoms  Ear/Nose/Throat : Negative for Ear/Nose/Throat  symptoms  Hematologic/Lymphatic: Negative for Hematologic/Lymphatic symptoms  Cardiovascular : Negative for cardiovascular symptoms  Respiratory : Negative for respiratory symptoms  Endocrine: Negative for endocrine symptoms  Musculoskeletal: Negative for musculoskeletal symptoms  Neurological: Negative for neurological symptoms  Psychologic: Negative for psychiatric symptoms   Physical Exam: BP 158/73 mmHg  Pulse 64  Ht 5\' 7"  (1.702 m)  Wt 189 lb 12.8 oz (86.093 kg)  BMI 29.72 kg/m2   Laboratory Data: Results for orders placed or performed in visit on 10/12/14  CULTURE, URINE COMPREHENSIVE  Result Value Ref Range   Urine Culture, Comprehensive Final report    Result 1 Comment    Results for orders placed or performed in visit on 10/12/14  CULTURE, URINE COMPREHENSIVE  Result Value Ref Range   Urine Culture, Comprehensive Final report    Result 1 Comment    Pertinent Imaging: CLINICAL DATA: Or urinary tract infection.  EXAM: ABDOMEN - 1 VIEW  COMPARISON: [facility] image for CT scan dated 12/28/2013  FINDINGS: Bowel gas pattern is normal. Small calcifications in the mid right kidney, unchanged. Calcifications in the uterus, also unchanged.  No acute osseous abnormality. Previous bone graft harvesting from the right ilium.  IMPRESSION: No acute abnormality. Stable calcifications in the mid right kidney.   Electronically Signed  By: Francene Boyers M.D.  On: 09/15/2014 14:02  Assessment & Plan:    1. Recurrent UTI's:  She had 6 documented UTI's since September 2015.  UA today was unremarkable.  Last UCx was negative for infection.  KUB demonstrated stable right renal calcifications.  UTI history:    12/04/2013 +UCx E.coli    12/28/2013 +UCx E.coli    07/20/2014 +UCx E.coli    08/02/2014 +UCx Strep    09/15/2014 +UCx E.coli    09/24/2014 +UCx E.coli    10/12/2014 -UCx no growth  - Urinalysis, Complete - CULTURE, URINE  COMPREHENSIVE  2. Microscopic hematuria:   Patient's did not provide an UA for today's exam.  She will return in the am for a sample.   3. Atrophic vaginitis:   Patient will continue the vaginal estrogen cream (Premarin) and instructed to apply 0.5mg  (pea-sized amount)  just inside the vaginal introitus with a finger-tip every  Monday, Wednesday and Friday nights.  She will return in one year for reexamination.     Return in about 1 year (around 10/26/2015) for UA and exam.  Michiel Cowboy, [facility] [address] [place], [place] [address] [phone]

## 2014-10-27 NOTE — Telephone Encounter (Signed)
Spoke with pt in reference to needing a urine for u/a. Pt voiced understanding and will come by in the morning to give another clean catch.

## 2014-10-27 NOTE — Telephone Encounter (Signed)
LMOM

## 2014-10-27 NOTE — Telephone Encounter (Signed)
Patient's UA from her visit on 10/26/2014 is not in EPIC.  Would you check into this?

## 2014-10-28 ENCOUNTER — Telehealth: Payer: Self-pay

## 2014-10-28 ENCOUNTER — Other Ambulatory Visit: Payer: Medicare Other

## 2014-10-28 DIAGNOSIS — R310 Gross hematuria: Secondary | ICD-10-CM

## 2014-10-28 DIAGNOSIS — I6523 Occlusion and stenosis of bilateral carotid arteries: Secondary | ICD-10-CM

## 2014-10-28 LAB — MICROSCOPIC EXAMINATION

## 2014-10-28 LAB — URINALYSIS, COMPLETE: BACTERIA UA: NONE SEEN

## 2014-10-28 NOTE — Telephone Encounter (Signed)
-----   Message from Harle Battiest, PA-C sent at 10/28/2014 12:56 PM EDT ----- Please send urine for culture, if no infection, I would like another UA next week to check for micro heme.  If +UCx please treat with appropriate antibiotic, and recheck UA.

## 2014-10-30 LAB — CULTURE, URINE COMPREHENSIVE

## 2014-11-10 ENCOUNTER — Telehealth: Payer: Self-pay

## 2014-11-10 NOTE — Telephone Encounter (Signed)
I would like the patient to have another UA to check for micro heme.

## 2014-11-10 NOTE — Telephone Encounter (Signed)
Pt called wanting to know results of u/a and ucx. Made pt aware of negative ucx and there was 3-10 rbc on u/a. Pt asked what are the next steps. Please advise.

## 2014-11-11 NOTE — Telephone Encounter (Signed)
Spoke with pt in reference to micro heme. Pt stated she always has blood in her urine and has since she was a child. Nurse reinforced with pt the need for another u/a. Pt stated she would RTC tomorrow for another clean catch urine.

## 2014-11-11 NOTE — Telephone Encounter (Signed)
Full mailbox

## 2014-11-16 ENCOUNTER — Telehealth: Payer: Self-pay | Admitting: Cardiovascular Disease

## 2014-11-16 ENCOUNTER — Telehealth: Payer: Self-pay | Admitting: *Deleted

## 2014-11-16 MED ORDER — LOSARTAN POTASSIUM-HCTZ 100-25 MG PO TABS: 1.0000 | ORAL_TABLET | Freq: Every day | ORAL | Status: DC

## 2014-11-16 NOTE — Telephone Encounter (Signed)
Refill sent for Losartan HCTZ  

## 2014-11-16 NOTE — Telephone Encounter (Signed)
°  1. Which medications need to be refilled?  Losartin hctz  2. Which pharmacy is medication to be sent to? [facility]   3. Do they need a 30 day or 90 day supply? 90 per medicare patient needs 90 not 30 please change existing rx  4. Would they like a call back once the medication has been sent to the pharmacy? no

## 2014-11-16 NOTE — Telephone Encounter (Signed)
°  1. Which medications need to be refilled? Losartin/hctz 100-25 mg 1 tab po daily   2. Which pharmacy is medication to be sent to?  [facility]    3. Do they need a 30 day or 90 day supply? 90 - Per Pharm Medicare prefers patient have 90 filled   4. Would they like a call back once the medication has been sent to the pharmacy? No

## 2014-11-16 NOTE — Telephone Encounter (Signed)
[facility] calling stating they can't take it as 30 day pills with 3 refills it has to be only for 90 pills  Please re-send rx  1. Which medications need to be refilled? Losartin hctz  2. Which pharmacy is medication to be sent to? [facility]   3. Do they need a 30 day or 90 day supply? 90 per medicare patient needs 90 not 30 please change existing rx  4. Would they like a call back once the medication has been sent to the pharmacy? no

## 2014-11-16 NOTE — Telephone Encounter (Signed)
°  1. Which medications need to be refilled? Losartin/hctz 100-25 mg po 1 tab daily   2. Which pharmacy is medication to be sent to?  [facility]   3. Do they need a 30 day or 90 day supply? 90 per pharm medicare needs 90 day supply  4. Would they like a call back once the medication has been sent to the pharmacy? no

## 2014-11-22 ENCOUNTER — Other Ambulatory Visit: Payer: Self-pay | Admitting: Podiatry

## 2014-11-22 ENCOUNTER — Ambulatory Visit: Payer: Medicare Other | Admitting: Cardiovascular Disease

## 2014-11-22 DIAGNOSIS — M7671 Peroneal tendinitis, right leg: Secondary | ICD-10-CM

## 2014-11-26 ENCOUNTER — Ambulatory Visit
Admission: RE | Admit: 2014-11-26 | Discharge: 2014-11-26 | Disposition: A | Discharge status: Home / Self Care | Payer: Medicare Other | Source: Ambulatory Visit | Attending: Podiatry | Admitting: Podiatry

## 2014-11-26 DIAGNOSIS — M25471 Effusion, right ankle: Secondary | ICD-10-CM | POA: Diagnosis present

## 2014-11-26 DIAGNOSIS — M7671 Peroneal tendinitis, right leg: Secondary | ICD-10-CM

## 2014-11-26 DIAGNOSIS — M25571 Pain in right ankle and joints of right foot: Secondary | ICD-10-CM | POA: Insufficient documentation

## 2014-12-31 ENCOUNTER — Ambulatory Visit (INDEPENDENT_AMBULATORY_CARE_PROVIDER_SITE_OTHER): Payer: Medicare Other | Admitting: Cardiovascular Disease

## 2014-12-31 ENCOUNTER — Encounter: Payer: Self-pay | Admitting: Cardiovascular Disease

## 2014-12-31 VITALS — BP 128/60 | HR 57 | Ht 67.0 in | Wt 188.8 lb

## 2014-12-31 DIAGNOSIS — M791 Myalgia, unspecified site: Secondary | ICD-10-CM | POA: Insufficient documentation

## 2014-12-31 DIAGNOSIS — I6529 Occlusion and stenosis of unspecified carotid artery: Secondary | ICD-10-CM

## 2014-12-31 DIAGNOSIS — I208 Other forms of angina pectoris: Secondary | ICD-10-CM

## 2014-12-31 DIAGNOSIS — I2510 Atherosclerotic heart disease of native coronary artery without angina pectoris: Secondary | ICD-10-CM

## 2014-12-31 DIAGNOSIS — I10 Essential (primary) hypertension: Secondary | ICD-10-CM

## 2014-12-31 DIAGNOSIS — E785 Hyperlipidemia, unspecified: Secondary | ICD-10-CM

## 2014-12-31 NOTE — Assessment & Plan Note (Signed)
Stable angina, no regular exercise program No changes to her medications. Unable to tolerate statin

## 2014-12-31 NOTE — Progress Notes (Signed)
Patient ID: [name] Cooper, female    DOB: [birth year], 74 y.o.   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 Moderate bilateral carotid arterial disease  She reports diffuse pain everywhere, chest, back, legs to any palpation She is not been able to tolerate Crestor She has seen rheumatology, done a course of prednisone with no significant improvement in her symptoms. She does not do her carotid ultrasound in 2016 She has bilateral 40-59% carotid arterial disease, bruit on the right Denies any significant shortness of breath on exertion. No regular exercise program  EKG on today's visit shows normal sinus rhythm with rate 57 bpm, no significant ST or T-wave changes  Other past medical history Cardiac catheter at [facility] for chest discomfort earlier in the year showed patent LAD stent with no other significant stenoses. Medical management recommended  Carotid arterial disease noted bilaterally 40-59%     Allergies  Allergen Reactions  . Sulfa Antibiotics Anaphylaxis    Current Outpatient Prescriptions on File Prior to Visit  Medication Sig Dispense Refill  . aspirin EC 81 MG tablet Take 81 mg by mouth every other day.  90 tablet 3  . conjugated estrogens (PREMARIN) vaginal cream Place 1 Applicatorful vaginally daily. Patient was given a sample of vaginal estrogen cream and instructed to apply 0.5mg  (pea-sized amount)  just inside the vaginal introitus with a finger-tip every night for two weeks and then Monday, Wednesday and Friday nights. 42.5 g 12  . losartan-hydrochlorothiazide (HYZAAR) 100-25 MG per tablet Take 1 tablet by mouth daily. 90 tablet 3  . zolpidem (AMBIEN) 10 MG tablet  Take 10 mg by mouth at bedtime as needed.       No current facility-administered medications on file prior to visit.    Past Medical History  Diagnosis Date  . Coronary artery disease   . Hyperlipidemia   . Arthritis   . Iron deficiency   . Renal insufficiency   . UTI (urinary tract infection)   . Venous stasis   . Thrombocytopenia (HCC)   . HTN (hypertension)   . History of kidney stones   . Cholelithiases   . Frozen shoulder     Past Surgical History  Procedure Laterality Date  . Cardiac catheterization      stents placed in mid LAD  . Frozen shoulder    . Kidney stone surgery    . Cervical spine surgery    . Leg surgery      broke her leg   . Fetal blood transfusion  2014    Social History  reports that she has never smoked. She has never used smokeless tobacco. She reports that she does not drink alcohol or use illicit drugs.  Family History family history includes Diabetes in her brother; Heart attack in her father and mother; Heart disease in her brother; Hypertension in her mother; Kidney disease in her brother.   Review of Systems  Constitutional: Negative.   Respiratory: Negative.   Cardiovascular: Negative.   Gastrointestinal: Negative.   Musculoskeletal: Positive for arthralgias.  Neurological: Negative.   Hematological: Negative.   Psychiatric/Behavioral: Negative.   All other systems reviewed and are negative.  BP 128/60 mmHg  Pulse 57  Ht  (1.702 m)  Wt 188 lb 12 oz (85.616 kg)  BMI 29.56 kg/m2  Physical Exam  Constitutional: She is oriented to person, place, and time. She appears well-developed and well-nourished.  Tender muscles diffusely, arms, legs, chest even with minimal palpation on the legs  HENT:  Head: Normocephalic.  Nose: Nose normal.  Mouth/Throat: Oropharynx is clear and moist.  Eyes: Conjunctivae are normal. Pupils are equal, round, and reactive to light.  Neck: Normal range of motion. Neck supple. No JVD present.   Cardiovascular: Normal rate, regular rhythm, S1 normal, S2 normal, normal heart sounds and intact distal pulses.  Exam reveals no gallop and no friction rub.   No murmur heard. Pulmonary/Chest: Effort normal and breath sounds normal. No respiratory distress. She has no wheezes. She has no rales. She exhibits no tenderness.  Abdominal: Soft. Bowel sounds are normal. She exhibits no distension. There is no tenderness.  Musculoskeletal: Normal range of motion. She exhibits no edema or tenderness.  Lymphadenopathy:    She has no cervical adenopathy.  Neurological: She is alert and oriented to person, place, and time. Coordination normal.  Skin: Skin is warm and dry. No rash noted. No erythema.  Psychiatric: She has a normal mood and affect. Her behavior is normal. Judgment and thought content normal.    Assessment and Plan  Nursing note and vitals reviewed.

## 2014-12-31 NOTE — Patient Instructions (Addendum)
You are doing well. No medication changes were made.  We will check carotid u/s in early 2017 You are scheduled for:___________________  Please call us if you have new issues that need to be addressed before your next appt.  Your physician wants you to follow-up in: 6 months.  You will receive a reminder letter in the mail two months in advance. If you don't receive a letter, please call our office to schedule the follow-up appointment.  Carotid Artery Disease The carotid arteries are the two main arteries on either side of the neck that supply blood to the brain. Carotid artery disease, also called carotid artery stenosis, is the narrowing or blockage of one or both carotid arteries. Carotid artery disease increases your risk for a stroke or a transient ischemic attack (TIA). A TIA is an episode in which a waxy, fatty substance that accumulates within the artery (plaque) blocks blood flow to the brain. A TIA is considered a "warning stroke."  CAUSES   Buildup of plaque inside the carotid arteries (atherosclerosis) (common).  A weakened outpouching in an artery (aneurysm).  Inflammation of the carotid artery (arteritis).  A fibrous growth within the carotid artery (fibromuscular dysplasia).  Tissue death within the carotid artery due to radiation treatment (post-radiation necrosis).  Decreased blood flow due to spasms of the carotid artery (vasospasm).  Separation of the walls of the carotid artery (carotid dissection). RISK FACTORS  High cholesterol (dyslipidemia).   High blood pressure (hypertension).   Smoking.   Obesity.   Diabetes.   Family history of cardiovascular disease.   Inactivity or lack of regular exercise.   Being female. Men have an increased risk of developing atherosclerosis earlier in life than women.  SYMPTOMS  Carotid artery disease does not cause symptoms. DIAGNOSIS Diagnosis of carotid artery disease may include:   A physical exam. Your  health care provider may hear an abnormal sound (bruit) when listening to the carotid arteries.   Specific tests that look at the blood flow in the carotid arteries. These tests include:   Carotid artery ultrasonography.   Carotid or cerebral angiography.   Computerized tomographic angiography (CTA).   Magnetic resonance angiography (MRA).  TREATMENT  Treatment of carotid artery disease can include a combination of treatments. Treatment options include:  Surgery. You may have:   A carotid endarterectomy. This is a surgery to remove the blockages in the carotid arteries.   A carotid angioplasty with stenting. This is a nonsurgical interventional procedure. A wire mesh (stent) is used to widen the blocked carotid arteries.   Medicines to control blood pressure, cholesterol, and reduce blood clotting (antiplatelet therapy).   Adjusting your diet.   Lifestyle changes such as:   Quitting smoking.   Exercising as tolerated or as directed by your health care provider.   Controlling and maintaining a good blood pressure.   Keeping cholesterol levels under control.  HOME CARE INSTRUCTIONS   Take medicines only as directed by your health care provider. Make sure you understand all your medicine instructions. Do not stop your medicines without talking to your health care provider.   Follow your health care provider's diet instructions. It is important to eat a healthy diet that is low in saturated fats and includes plenty of fresh fruits, vegetables, and lean meats. High-fat, high-sodium foods as well as foods that are fried, overly processed, or have poor nutritional value should be avoided.  Maintain a healthy weight.   Stay physically active. It is recommended that you  get at least 30 minutes of activity every day.   Do not use any tobacco products including cigarettes, chewing tobacco, or electronic cigarettes. If you need help quitting, ask your health care  provider.  Limit alcohol use to:   No more than 2 drinks per day for men.   No more than 1 drink per day for nonpregnant women.   Do not use illegal drugs.   Keep all follow-up visits as directed by your health care provider.  SEEK IMMEDIATE MEDICAL CARE IF:  You develop TIA or stroke symptoms. These include:   Sudden weakness or numbness on one side of the body, such as in the face, arm, or leg.   Sudden confusion.   Trouble speaking (aphasia) or understanding.   Sudden trouble seeing out of one or both eyes.   Sudden trouble walking.   Dizziness or feeling like you might faint.   Loss of balance or coordination.   Sudden severe headache with no known cause.   Sudden trouble swallowing (dysphagia).  If you have any of these symptoms, call your local emergency services (911 in [place] Do not drive yourself to the clinic or hospital. This is a medical emergency.    This information is not intended to replace advice given to you by your health care provider. Make sure you discuss any questions you have with your health care provider.   Document Released: 06/04/2011 Document Revised: 04/02/2014 Document Reviewed: 09/10/2012 Elsevier Interactive Patient Education [employer]

## 2014-12-31 NOTE — Assessment & Plan Note (Signed)
Bilateral carotid disease, bruit on the right Repeat carotid ultrasound early 2017

## 2014-12-31 NOTE — Assessment & Plan Note (Signed)
No recent lipid panel available for review We will draw one today. She is fasting

## 2014-12-31 NOTE — Assessment & Plan Note (Signed)
Atypical chest pain on today's visit Currently with no symptoms of angina. No further workup at this time. Continue current medication regimen.

## 2014-12-31 NOTE — Assessment & Plan Note (Signed)
Diffuse tenderness arms, chest, legs. Out of proportion to palpation at times. Etiology unclear, previously seen by rheumatology Currently not on a statin and continues to have symptoms

## 2014-12-31 NOTE — Assessment & Plan Note (Signed)
Blood pressure is well controlled on today's visit. No changes made to the medications. 

## 2015-01-01 LAB — HEPATIC FUNCTION PANEL
ALK PHOS: 133 IU/L — AB (ref 39–117)
ALT: 10 IU/L (ref 0–32)
AST: 25 IU/L (ref 0–40)
Albumin: 3.7 g/dL (ref 3.5–4.8)
BILIRUBIN TOTAL: 0.3 mg/dL (ref 0.0–1.2)
Bilirubin, Direct: 0.12 mg/dL (ref 0.00–0.40)
Total Protein: 6.9 g/dL (ref 6.0–8.5)

## 2015-01-01 LAB — LIPID PANEL
CHOL/HDL RATIO: 3.3 ratio (ref 0.0–4.4)
Cholesterol, Total: 214 mg/dL — ABNORMAL HIGH (ref 100–199)
HDL: 65 mg/dL (ref >39)
LDL Calculated: 131 mg/dL — ABNORMAL HIGH (ref 0–99)
TRIGLYCERIDES: 88 mg/dL (ref 0–149)
VLDL Cholesterol Cal: 18 mg/dL (ref 5–40)

## 2015-01-13 ENCOUNTER — Telehealth: Payer: Self-pay | Admitting: *Deleted

## 2015-01-13 NOTE — Telephone Encounter (Signed)
°  STAT if patient is at the pharmacy , call can be transferred to refill team.   1. Which medications need to be refilled? Cholesterol , Not sure of name   2. Which pharmacy/location is medication to be sent to? [facility]  3. Do they need a 30 day or 90 day supply? 90

## 2015-01-13 NOTE — Telephone Encounter (Signed)
Pt states that Dr. Mariah MillingGollan prescribed her a cholesterol medication which is not listed on her med list and she also stated that the doctor's called her last week to inform her that she needed to start taking this cholesterol medication. Please advise

## 2015-01-15 NOTE — Telephone Encounter (Signed)
If she has a problem with crestorm Would start atorvastatin 10 mg daily, #30, refill x 6

## 2015-01-17 MED ORDER — EZETIMIBE 10 MG PO TABS: 10.0000 mg | ORAL_TABLET | Freq: Every day | ORAL | Status: DC

## 2015-01-17 NOTE — Telephone Encounter (Signed)
Spoke w/ pt.  She reports that she spoke w/ another nurse last week while I was out of the office. States that was advised to try Zetia, but this was not called in to her pharmacy, so they would not check the price. Advised her that I am sending this rx in and for her to call back if she needs any further assistance.

## 2015-02-02 NOTE — Progress Notes (Signed)
This encounter was created in error - please disregard.

## 2015-02-08 ENCOUNTER — Other Ambulatory Visit: Payer: Self-pay | Admitting: Specialist

## 2015-02-08 DIAGNOSIS — M25572 Pain in left ankle and joints of left foot: Secondary | ICD-10-CM

## 2015-02-08 DIAGNOSIS — M7672 Peroneal tendinitis, left leg: Secondary | ICD-10-CM

## 2015-02-25 ENCOUNTER — Ambulatory Visit
Admission: RE | Admit: 2015-02-25 | Discharge: 2015-02-25 | Disposition: A | Discharge status: Home / Self Care | Payer: Medicare Other | Source: Ambulatory Visit | Attending: Specialist | Admitting: Specialist

## 2015-02-25 DIAGNOSIS — M7672 Peroneal tendinitis, left leg: Secondary | ICD-10-CM

## 2015-02-25 DIAGNOSIS — M65872 Other synovitis and tenosynovitis, left ankle and foot: Secondary | ICD-10-CM | POA: Diagnosis not present

## 2015-02-25 DIAGNOSIS — M25572 Pain in left ankle and joints of left foot: Secondary | ICD-10-CM | POA: Insufficient documentation

## 2015-03-23 DIAGNOSIS — M1710 Unilateral primary osteoarthritis, unspecified knee: Secondary | ICD-10-CM | POA: Insufficient documentation

## 2015-03-23 DIAGNOSIS — M179 Osteoarthritis of knee, unspecified: Secondary | ICD-10-CM | POA: Insufficient documentation

## 2015-04-18 ENCOUNTER — Other Ambulatory Visit: Payer: Self-pay | Admitting: Orthopedic Surgery

## 2015-04-18 DIAGNOSIS — M12572 Traumatic arthropathy, left ankle and foot: Secondary | ICD-10-CM

## 2015-04-20 ENCOUNTER — Ambulatory Visit
Admission: RE | Admit: 2015-04-20 | Discharge: 2015-04-20 | Disposition: A | Discharge status: Home / Self Care | Payer: Medicare Other | Source: Ambulatory Visit | Attending: Orthopedic Surgery | Admitting: Orthopedic Surgery

## 2015-04-20 DIAGNOSIS — M12572 Traumatic arthropathy, left ankle and foot: Secondary | ICD-10-CM

## 2015-05-04 ENCOUNTER — Other Ambulatory Visit: Payer: Self-pay | Admitting: Cardiovascular Disease

## 2015-05-04 DIAGNOSIS — I6523 Occlusion and stenosis of bilateral carotid arteries: Secondary | ICD-10-CM

## 2015-05-09 ENCOUNTER — Encounter: Payer: Self-pay | Admitting: Physician Assistant

## 2015-05-10 ENCOUNTER — Telehealth: Payer: Self-pay | Admitting: *Deleted

## 2015-05-10 NOTE — Telephone Encounter (Signed)
Please call Inetta Fermo at  [facility] # [phone] looking cardiac clearance. Patient having surgery on Fri  05/13/15 for fracture of lower leg and ankle. Needs cc STAT!!

## 2015-05-10 NOTE — Telephone Encounter (Signed)
Acceptable risk for upcoming leg surgery No further workup needed Would stay on low-dose aspirin 81 mg daily

## 2015-05-11 ENCOUNTER — Encounter: Payer: Self-pay | Admitting: Physician Assistant

## 2015-05-11 ENCOUNTER — Ambulatory Visit (INDEPENDENT_AMBULATORY_CARE_PROVIDER_SITE_OTHER): Payer: Medicare Other | Admitting: Physician Assistant

## 2015-05-11 VITALS — BP 142/78 | HR 59 | Ht 67.0 in | Wt 185.5 lb

## 2015-05-11 DIAGNOSIS — M797 Fibromyalgia: Secondary | ICD-10-CM

## 2015-05-11 DIAGNOSIS — I2510 Atherosclerotic heart disease of native coronary artery without angina pectoris: Secondary | ICD-10-CM

## 2015-05-11 DIAGNOSIS — R079 Chest pain, unspecified: Secondary | ICD-10-CM | POA: Diagnosis not present

## 2015-05-11 DIAGNOSIS — Z01810 Encounter for preprocedural cardiovascular examination: Secondary | ICD-10-CM | POA: Diagnosis not present

## 2015-05-11 DIAGNOSIS — I739 Peripheral vascular disease, unspecified: Secondary | ICD-10-CM

## 2015-05-11 DIAGNOSIS — I10 Essential (primary) hypertension: Secondary | ICD-10-CM | POA: Diagnosis not present

## 2015-05-11 DIAGNOSIS — E785 Hyperlipidemia, unspecified: Secondary | ICD-10-CM

## 2015-05-11 DIAGNOSIS — I779 Disorder of arteries and arterioles, unspecified: Secondary | ICD-10-CM

## 2015-05-11 NOTE — Progress Notes (Signed)
Cardiology Office Note Date:  05/11/2015  Patient ID:  [name] Cooper, DOB 1940-10-06, MRN [id number] PCP:  [name], MD  Cardiologist:  Dr. Mariah Milling, MD    Chief Complaint: Preoperative clearance  History of Present Illness: [name] Cooper is a 75 y.o. female with history of CAD s/p prior PCI/stenting to mid LAD with repeat stenting for ISR over 10 years prior, bilateral carotid arterial stenosis, HLD with statin intolerance, HTN, fibromyalgia, diffuse muscle aches and arthritis who presents for preoperative evaluation.   Cardiac cath 01/2011 for chest pain showed patent LAD stent with no significant ISR. Mild lumenal irregularities. Normal LV systolic function with EF estimated at 55%. No significant AS or MR. It was felt her chest pain could be 2/2 small vessel disease and medical management was advised. Her last carotid doppler in 03/2013 showed bilateral 40-59% stenosis with antegrade flow. At her last follow up on 12/31/2014 she reported diffuse pain everywhere that was reproducible to palpation. She had previously seen rheumatology and been started on prednisone without any improvement in her symptoms. She was continued on her current treamtent without changes as she was stable at that time. She had been dealing with a painful left ankle for several months stemming from a mechanical fall in October 2/2 ice. Xrays were negative. She ultimately had a CT and MRI of the left ankle that showed a mild step off fracture that will require surgery.   She notes some mild chest discomfort when taking the trash cans out to the curb with associated SOB. Symptoms are short lived and are unchanged over the years. No chest pain or SOB when ambulating long distances. She continues to have diffuse aches and pains. Weught has been stable. No orthopnea, PND, early satiety, or LEE. BP has been running in the 120's systolic at home. She reports getting nervous at the MD office.    Past Medical History    Diagnosis Date  . Coronary artery disease     a. cardiac cath 01/2011: mid LAD stent patent, mild lumenal irreg's, nl EF 55%, no AS/MR  . Hyperlipidemia     a. statin intolerant  . Arthritis   . Iron deficiency   . CKD (chronic kidney disease), stage II   . UTI (urinary tract infection)   . Venous stasis   . Thrombocytopenia (HCC)   . HTN (hypertension)   . History of kidney stones   . Cholelithiases   . Frozen shoulder   . Fibromyalgia     Past Surgical History  Procedure Laterality Date  . Cardiac catheterization      stents placed in mid LAD  . Frozen shoulder    . Kidney stone surgery    . Cervical spine surgery    . Leg surgery      broke her leg   . Fetal blood transfusion  2014    Current Outpatient Prescriptions  Medication Sig Dispense Refill  . aspirin EC 81 MG tablet Take 81 mg by mouth every other day.  90 tablet 3  . conjugated estrogens (PREMARIN) vaginal cream Place 1 Applicatorful vaginally daily. Patient was given a sample of vaginal estrogen cream and instructed to apply 0.5mg  (pea-sized amount)  just inside the vaginal introitus with a finger-tip every night for two weeks and then Monday, Wednesday and Friday nights. 42.5 g 12  . losartan-hydrochlorothiazide (HYZAAR) 100-25 MG per tablet Take 1 tablet by mouth daily. 90 tablet 3  . zolpidem (AMBIEN) 10 MG tablet Take 10 mg by  mouth at bedtime as needed.       No current facility-administered medications for this visit.    Allergies:   Sulfa antibiotics   Social History:  The patient  reports that she has never smoked. She has never used smokeless tobacco. She reports that she does not drink alcohol or use illicit drugs.   Family History:  The patient's family history includes Diabetes in her brother; Heart attack in her father and mother; Heart disease in her brother; Hypertension in her mother; Kidney disease in her brother.  ROS:   Review of Systems  Constitutional: Positive for malaise/fatigue.  Negative for fever, chills, weight loss and diaphoresis.  HENT: Negative for congestion.   Eyes: Negative for discharge and redness.  Respiratory: Positive for shortness of breath. Negative for cough, hemoptysis, sputum production and wheezing.   Cardiovascular: Negative for palpitations, orthopnea, claudication, leg swelling and PND.       Chest discomfort  Gastrointestinal: Negative for heartburn, nausea, vomiting, abdominal pain, diarrhea and constipation.  Musculoskeletal: Positive for myalgias, joint pain and falls. Negative for back pain and neck pain.       Mechanical fall in the fall of 2016 secondary to ice  Skin: Negative for rash.  Neurological: Positive for weakness. Negative for dizziness, tingling, tremors, sensory change, speech change, focal weakness, seizures and loss of consciousness.  Endo/Heme/Allergies: Does not bruise/bleed easily.  Psychiatric/Behavioral: The patient is not nervous/anxious.   All other systems reviewed and are negative.    PHYSICAL EXAM: VS:  BP 142/78 mmHg  Pulse 59  Ht  (1.702 m)  Wt 185 lb 8 oz (84.142 kg)  BMI 29.05 kg/m2 BMI: Body mass index is 29.05 kg/(m^2). Well nourished, well developed, in no acute distress HEENT: normocephalic, atraumatic Neck: no JVD, carotid bruits or masses Cardiac:  normal S1, S2; RRR; no murmurs, rubs, or gallops Lungs:  clear to auscultation bilaterally, no wheezing, rhonchi or rales Abd: soft, nontender, no hepatomegaly, + BS MS: no deformity or atrophy Ext: no edema, diffusely TTP Skin: warm and dry, no rash Neuro:  moves all extremities spontaneously, no focal abnormalities noted, follows commands Psych: euthymic mood, full affect   EKG:  Was ordered today. Shows sinus bradycardia, 59 bpm, no significant st/t changes   Recent Labs: 12/31/2014: ALT 10  12/31/2014: Chol/HDL Ratio 3.3; Cholesterol, Total 214*; HDL 65; LDL Calculated 131*; Triglycerides 88   CrCl cannot be calculated (Patient has no  serum creatinine result on file.).   Wt Readings from Last 3 Encounters:  05/11/15 185 lb 8 oz (84.142 kg)  12/31/14 188 lb 12 oz (85.616 kg)  10/26/14 189 lb 12.8 oz (86.093 kg)     Other studies reviewed: Additional studies/records reviewed today include: summarized above  ASSESSMENT AND PLAN:  1. Preoperative clearance: Schedule Lexiscan Myoview to evaluate for high risk ischemia given SOB and chest discomfort with exertion (taking out garbage cans). If normal ok for surgery at moderate risk.  2. CAD s/p PCI as above: As above. Continue aspirin 81 mg daily. She does not take faithfully. Advised to take as directed.   3. Carotid artery diseae: Scheduled for carotid artery duplex 05/16/2015.   4. HLD: Intolerant to statins. Did not tolerate Zetia. Healthy diet advised.   5. Fibromyalgia: Sore to palpation diffusely. Perhaps this is playing a role in her symptoms in #1.   Disposition: F/u with Dr. Mariah Milling in 3 months.   Current medicines are reviewed at length with the patient today.  The  patient did not have any concerns regarding medicines.  Elinor Dodge PA-C 05/11/2015 9:19 AM     [facility] [address] [place], [place] [address] [phone]

## 2015-05-11 NOTE — Patient Instructions (Signed)
Medication Instructions:  Your physician recommends that you continue on your current medications as directed. Please refer to the Current Medication list given to you today.   Labwork: none  Testing/Procedures: Your physician has requested that you have a lexiscan myoview. For further information please visit [email or web address] Please follow instruction sheet, as given.  [facility] MYOVIEW  Your caregiver has ordered a Stress Test with nuclear imaging. The purpose of this test is to evaluate the blood supply to your heart muscle. This procedure is referred to as a "Non-Invasive Stress Test." This is because other than having an IV started in your vein, nothing is inserted or "invades" your body. Cardiac stress tests are done to find areas of poor blood flow to the heart by determining the extent of coronary artery disease (CAD). Some patients exercise on a treadmill, which naturally increases the blood flow to your heart, while others who are  unable to walk on a treadmill due to physical limitations have a pharmacologic/chemical stress agent called Lexiscan . This medicine will mimic walking on a treadmill by temporarily increasing your coronary blood flow.   Please note: these test may take anywhere between 2-4 hours to complete  PLEASE REPORT TO [facility] MEDICAL MALL ENTRANCE  THE VOLUNTEERS AT THE FIRST DESK WILL DIRECT YOU WHERE TO GO  Date of Procedure: Monday, Feb 20  Arrival Time for Procedure: 7:15am  Instructions regarding medication:   You may take your morning medications with a sip of water.  PLEASE NOTIFY THE OFFICE AT LEAST 24 HOURS IN ADVANCE IF YOU ARE UNABLE TO KEEP YOUR APPOINTMENT.  [phone] AND  PLEASE NOTIFY NUCLEAR MEDICINE AT [facility] AT LEAST 24 HOURS IN ADVANCE IF YOU ARE UNABLE TO KEEP YOUR APPOINTMENT. [phone]  How to prepare for your Myoview test:  1. Do not eat or drink after midnight 2. No caffeine for 24 hours prior to test 3. No smoking 24 hours prior  to test. 4. Your medication may be taken with water.  If your doctor stopped a medication because of this test, do not take that medication. 5. Ladies, please do not wear dresses.  Skirts or pants are appropriate. Please wear a short sleeve shirt. 6. No perfume, cologne or lotion. 7. Wear comfortable walking shoes. No heels!            Follow-Up: Your physician recommends that you schedule a follow-up appointment in: three months with Dr. Mariah Milling   Any Other Special Instructions Will Be Listed Below (If Applicable).     If you need a refill on your cardiac medications before your next appointment, please call your pharmacy.

## 2015-05-13 ENCOUNTER — Telehealth: Payer: Self-pay

## 2015-05-13 NOTE — Telephone Encounter (Signed)
Reviewed lexi myoview instructions with pt who verbalized understanding.

## 2015-05-16 ENCOUNTER — Ambulatory Visit: Payer: Medicare Other

## 2015-05-16 ENCOUNTER — Encounter
Admission: RE | Admit: 2015-05-16 | Discharge: 2015-05-16 | Disposition: A | Discharge status: Home / Self Care | Payer: Medicare Other | Source: Ambulatory Visit | Attending: Physician Assistant | Admitting: Physician Assistant

## 2015-05-16 DIAGNOSIS — R079 Chest pain, unspecified: Secondary | ICD-10-CM | POA: Insufficient documentation

## 2015-05-16 DIAGNOSIS — I6523 Occlusion and stenosis of bilateral carotid arteries: Secondary | ICD-10-CM | POA: Diagnosis not present

## 2015-05-16 LAB — NM MYOCAR MULTI W/SPECT W/WALL MOTION / EF
CHL CUP NUCLEAR SDS: 11
CHL CUP NUCLEAR SRS: 5
CHL CUP STRESS STAGE 1 HR: 57 {beats}/min
CHL CUP STRESS STAGE 2 HR: 58 {beats}/min
CHL CUP STRESS STAGE 2 SPEED: 0 mph
CHL CUP STRESS STAGE 3 HR: 58 {beats}/min
CHL CUP STRESS STAGE 4 HR: 96 {beats}/min
CHL CUP STRESS STAGE 4 SPEED: 0 mph
CHL CUP STRESS STAGE 5 DBP: 46 mmHg
CHL CUP STRESS STAGE 5 GRADE: 0 %
CHL CUP STRESS STAGE 5 HR: 88 {beats}/min
CHL CUP STRESS STAGE 5 SBP: 190 mmHg
CHL CUP STRESS STAGE 5 SPEED: 0 mph
CHL CUP STRESS STAGE 6 DBP: 67 mmHg
CHL CUP STRESS STAGE 6 SBP: 180 mmHg
CSEPED: 0 min
CSEPEDS: 0 s
CSEPPMHR: 65 %
Estimated workload: 1 METS
LV dias vol: 81 mL
LV sys vol: 29 mL
Peak HR: 96 {beats}/min
Rest HR: 54 {beats}/min
SSS: 15
Stage 2 Grade: 0 %
Stage 3 Grade: 0 %
Stage 3 Speed: 0 mph
Stage 4 Grade: 0 %
Stage 6 Grade: 0 %
Stage 6 HR: 81 {beats}/min
Stage 6 Speed: 0 mph
TID: 0.96

## 2015-05-16 MED ORDER — TECHNETIUM TC 99M SESTAMIBI - CARDIOLITE
31.5790 | Freq: Once | INTRAVENOUS | Status: AC | PRN
  Administered 2015-05-16: 09:00:00 31.579 via INTRAVENOUS

## 2015-05-16 MED ORDER — REGADENOSON 0.4 MG/5ML IV SOLN
0.4000 mg | Freq: Once | INTRAVENOUS | Status: AC
  Administered 2015-05-16: 0.4 mg via INTRAVENOUS

## 2015-05-16 MED ORDER — TECHNETIUM TC 99M SESTAMIBI - CARDIOLITE
13.2490 | Freq: Once | INTRAVENOUS | Status: AC | PRN
  Administered 2015-05-16: 13.249 via INTRAVENOUS

## 2015-05-17 ENCOUNTER — Telehealth: Payer: Self-pay

## 2015-05-17 NOTE — Telephone Encounter (Signed)
S/w Inetta Fermo at [facility] who requests clearance faxed to [phone]. Clearance signed by Eula Listen, PA-C and faxed

## 2015-05-19 ENCOUNTER — Ambulatory Visit: Payer: Medicare Other | Admitting: Cardiovascular Disease

## 2015-06-07 ENCOUNTER — Telehealth: Payer: Self-pay | Admitting: Cardiovascular Disease

## 2015-06-07 NOTE — Telephone Encounter (Signed)
Pre admit calling [name] Cooper calling  Stating they received EKG and cardiac clearance but they need more information.  She can't really see the EKG would like us to send tracing of that as well as last OV notes and Stress test that she recently did.  Please fax to [phone]  Please send by end of today pt is to have procedure tomorrow .

## 2015-06-07 NOTE — Telephone Encounter (Signed)
Last office note & EKG routed to [phone].

## 2015-06-30 ENCOUNTER — Ambulatory Visit: Payer: Medicare Other | Admitting: Cardiovascular Disease

## 2015-08-11 ENCOUNTER — Ambulatory Visit: Payer: Medicare Other | Admitting: Cardiovascular Disease

## 2015-08-24 ENCOUNTER — Ambulatory Visit: Payer: Medicare Other | Attending: Orthopedic Surgery | Admitting: Physical Therapy

## 2015-08-24 ENCOUNTER — Encounter: Payer: Self-pay | Admitting: Physical Therapy

## 2015-08-24 DIAGNOSIS — M6281 Muscle weakness (generalized): Secondary | ICD-10-CM | POA: Insufficient documentation

## 2015-08-24 DIAGNOSIS — R262 Difficulty in walking, not elsewhere classified: Secondary | ICD-10-CM

## 2015-08-24 DIAGNOSIS — M25572 Pain in left ankle and joints of left foot: Secondary | ICD-10-CM | POA: Diagnosis present

## 2015-08-24 NOTE — Therapy (Signed)
[facility] MAIN [facility] SERVICES [address] [place], [place], [address] Phone: [phone]   Fax:  [phone]  Physical Therapy Evaluation  Patient Details  Name: [name] Cooper MRN: [id number] Date of Birth: 09-05-1940 Referring Provider: Lavena Stanford  Encounter Date: 08/24/2015      PT End of Session - 08/24/15 0858    Visit Number 1   Number of Visits 9   Date for PT Re-Evaluation 10/19/15   Authorization Type gcode 1   Authorization Time Period 10   PT Start Time 0805   PT Stop Time 0850   PT Time Calculation (min) 45 min   Activity Tolerance Patient tolerated treatment well   Behavior During Therapy [facility] for tasks assessed/performed      Past Medical History  Diagnosis Date  . Coronary artery disease     a. cardiac cath 01/2011: mid LAD stent patent, mild lumenal irreg's, nl EF 55%, no AS/MR  . Hyperlipidemia     a. statin intolerant  . Iron deficiency   . CKD (chronic kidney disease), stage II   . UTI (urinary tract infection)   . Venous stasis   . Thrombocytopenia (HCC)   . History of kidney stones   . Cholelithiases   . Fibromyalgia   . Arthritis     left hip, left ankle  . Frozen shoulder   . HTN (hypertension)     controlled with medication;     Past Surgical History  Procedure Laterality Date  . Cardiac catheterization      stents placed in mid LAD  . Frozen shoulder    . Kidney stone surgery    . Cervical spine surgery    . Leg surgery      broke her leg   . Fetal blood transfusion  2014    There were no vitals filed for this visit.       Subjective Assessment - 08/24/15 0812    Subjective 75 yo Female reports falling 3 years ago. She had a broken left leg and left ankle. She reports having ORIF surgery at that time; She reports that it healed and she was doing well. She reports about October last year she started having increased left ankle pain. She would get steriod injection which only  provided temporary relief. She had CT scan and MRI which showed mass in left ankle. Patient had left ankle Fusion on June 08, 2015. She is currently wearing a boot all the time. She is supposed to see her doctor tomorrow for updates on restrictions; she does report some pinching discomfort on lateral left ankle which started about a week ago; She presents to therapy with SPC; She will report that 1-2 times she will use the walker and hop (NWB on LLE) as she doesn't have the boot on when going to the bathroom;    Pertinent History personal factors affecting rehab: fibromyalgia and arthritis (progressive); chronic condition; lives alone;    How long can you sit comfortably? NA   How long can you stand comfortably? 10-15 min   How long can you walk comfortably? about 300-500 feet;    Diagnostic tests May 2017 X-rays of left ankle which looked well according to patient;    Patient Stated Goals Get stronger so that she can walk without cane and get back to PLOF.    Currently in Pain? No/denies            [facility] PT Assessment - 08/24/15 0001  Assessment   Medical Diagnosis left ankle pain s/p fusion   Referring Provider Inocencio HomesVIENS, NICHOLAS A   Onset Date/Surgical Date 06/08/15   Hand Dominance Right   Next MD Visit 08/25/15   Prior Therapy denies any PT for this condition;    Precautions   Precautions Fall   Required Braces or Orthoses Other Brace/Splint   Other Brace/Splint left ankle boot when weight bearing;    Restrictions   Weight Bearing Restrictions --  LLE is WBAT with boot   Balance Screen   Has the patient fallen in the past 6 months No   Has the patient had a decrease in activity level because of a fear of falling?  No   Is the patient reluctant to leave their home because of a fear of falling?  No   Home Environment   Additional Comments Single story home with 5 steps to enter in front and ramp with B rails; patient utilizes ramp to enter home; lives alone; mostly mod I for self  care ADLs; she is also doing some cooking and light housework; daughter will come in on the weekend sometimes;    Prior Function   Level of Independence Independent;Independent with gait;Independent with transfers   Vocation Retired   Leisure likes to travel;    Cognition   Overall Cognitive Status Within Functional Limits for tasks assessed   Observation/Other Assessments   Lower Extremity Functional Scale  9/80 (The lower the score the greater the disability)   Sensation   Light Touch Appears Intact   Coordination   Gross Motor Movements are Fluid and Coordinated Yes   Fine Motor Movements are Fluid and Coordinated Yes   AROM   Right Ankle Dorsiflexion 10   Right Ankle Plantar Flexion 65   Right Ankle Inversion 40   Right Ankle Eversion 12   Left Ankle Dorsiflexion -12   Left Ankle Plantar Flexion 35   Left Ankle Inversion 20   Left Ankle Eversion 5   Strength   Right Ankle Dorsiflexion 5/5   Right Ankle Plantar Flexion 5/5   Right Ankle Inversion 5/5   Right Ankle Eversion 5/5   Left Ankle Dorsiflexion 2+/5   Left Ankle Plantar Flexion 2+/5   Left Ankle Inversion 2/5   Left Ankle Eversion 2/5   Palpation   Palpation comment moderate tenderness along left talocrual joint; also has moderate tenderness along left lateral malleolus;    Transfers   Comments able to transfer mod I with chair rails;    Ambulation/Gait   Gait Comments ambulates with reciprocal gait pattern, slower gait speed, decreased stance on LLE with short step length on RLE;    Standardized Balance Assessment   10 Meter Walk 0.645 m/s without AD, with boot; (limited home and community ambulator)   High Level Balance   High Level Balance Comments RLE: SLS for 8-10 sec, LLE: unable due to pain;                            PT Education - 08/24/15 0858    Education provided Yes   Education Details findings, recommendations;    Person(s) Educated Patient   Methods Explanation    Comprehension Verbalized understanding             PT Long Term Goals - 08/24/15 1302    PT LONG TERM GOAL #1   Title Patient will be independent in home exercise program to improve strength/mobility for  better functional independence with ADLs.   Time 8   Period Weeks   Status New   PT LONG TERM GOAL #2   Title Patient (> 29 years old) will complete five times sit to stand test in < 15 seconds indicating an increased LE strength and improved balance.   Time 8   Period Weeks   Status New   PT LONG TERM GOAL #3   Title Patient will increase 10 meter walk test to >1.43m/s as to improve gait speed for better community ambulation and to reduce fall risk.   Time 8   Period Weeks   Status New   PT LONG TERM GOAL #4   Title Patient will increase lower extremity functional scale to >40/80 to demonstrate improved functional mobility and increased tolerance with ADLs.    Time 8   Period Weeks   Status New   PT LONG TERM GOAL #5   Title Patient will report a worst pain of 3/10 on VAS in   left foot/ankle          to improve tolerance with ADLs and reduced symptoms with activities.    Time 8   Period Weeks   Status New               Plan - 08/30/15 0859    Clinical Impression Statement 75 yo Female s/p recent LLE ankle fusion on 06/08/15 presents to therapy with left ankle pain. She is currently wearing a boot when ambulating. She is WBAT with boot on LLE. Patient able to walk without AD with slight antalgic gait pattern at slower gait speed. She does demonstrate decreased ankle ROM and significant weakness. She reports tenderness along left talocrual joint and left lateral malleolus. Patient would benefit from additional skilled PT intervention to reduce ankle pain, improve strength and return to PLOF. PT recommended 2x a week for 8 weeks, however patient expressed financial constraints. Will recommend therapy 1x a week for 8 weeks with focus on HEP.    Rehab Potential Good    Clinical Impairments Affecting Rehab Potential positive: good PLOF, negative: co-morbidities; Patient's clinical presentation is stable as her pain is limited to left ankle;    PT Frequency 1x / week   PT Duration 8 weeks   PT Treatment/Interventions ADLs/Self Care Home Management;Cryotherapy;Electrical Stimulation;Moist Heat;Balance training;Therapeutic exercise;Therapeutic activities;Functional mobility training;Stair training;Gait training;Neuromuscular re-education;Manual techniques;Taping;Energy conservation;Dry needling;Passive range of motion   PT Next Visit Plan initiate HEP   PT Home Exercise Plan will address next visit;    Consulted and Agree with Plan of Care Patient      Patient will benefit from skilled therapeutic intervention in order to improve the following deficits and impairments:  Abnormal gait, Decreased endurance, Hypomobility, Decreased activity tolerance, Decreased strength, Pain, Difficulty walking, Decreased mobility, Decreased balance, Decreased range of motion, Decreased safety awareness  Visit Diagnosis: Pain in left ankle and joints of left foot - Plan: PT plan of care cert/re-cert  Muscle weakness (generalized) - Plan: PT plan of care cert/re-cert  Difficulty in walking, not elsewhere classified - Plan: PT plan of care cert/re-cert      G-Codes - 2015/08/30 0908    Functional Assessment Tool Used 10 meter walk, strength, LEFs, clinical judgement;    Functional Limitation Mobility: Walking and moving around   Mobility: Walking and Moving Around Current Status [phone]) At least 40 percent but less than 60 percent impaired, limited or restricted   Mobility: Walking and Moving Around Goal Status (U0454) At least  1 percent but less than 20 percent impaired, limited or restricted       Problem List Patient Active Problem List   Diagnosis Date Noted  . Muscle tenderness 12/31/2014  . Atrophic vaginitis 09/16/2014  . Recurrent UTI 09/16/2014  . Microscopic  hematuria 09/16/2014  . Degenerative arthritis of hip 05/03/2014  . Bursitis, trochanteric 05/03/2014  . Achilles tendinitis 04/14/2014  . 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 DDD (degenerative disc disease), lumbar 04/14/2014  . L-S radiculopathy 04/14/2014  . Absolute anemia 10/19/2013  . Arteriosclerosis of coronary artery 10/19/2013  . BP (high blood pressure) 10/19/2013  . Calculus of kidney 10/19/2013  . 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 Impaired renal function 10/19/2013  . Change in blood platelet count 10/19/2013  . Stasis, venous 10/19/2013  . Angina pectoris, nocturnal (HCC) 04/24/2013  . 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 Carotid artery stenosis 01/22/2011  . Chest pain 07/24/2010  . Hyperlipidemia 05/17/2009  . Essential hypertension, benign 05/17/2009  . Coronary atherosclerosis 05/17/2009  . CAROTID BRUIT 05/17/2009  . 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    Trotter,Margaret PT, DPT 08/24/2015, 1:04 PM  [facility] MAIN [facility] SERVICES [address] [place], [place], [address] Phone: [phone]   Fax:  [phone]  Name: [name] Cooper MRN: [id number] Date of Birth: Nov 24, 1940

## 2015-08-30 ENCOUNTER — Ambulatory Visit: Payer: Medicare Other

## 2015-08-30 ENCOUNTER — Encounter: Payer: Medicare Other | Admitting: Physical Therapy

## 2015-09-01 ENCOUNTER — Ambulatory Visit: Payer: Medicare Other

## 2015-09-08 ENCOUNTER — Ambulatory Visit: Payer: Medicare Other

## 2015-09-15 ENCOUNTER — Ambulatory Visit: Payer: Medicare Other | Admitting: Cardiovascular Disease

## 2015-10-26 ENCOUNTER — Encounter: Payer: Self-pay | Admitting: Urology

## 2015-10-26 ENCOUNTER — Ambulatory Visit: Payer: Medicare Other | Admitting: Urology

## 2015-11-11 ENCOUNTER — Encounter: Payer: Self-pay | Admitting: General Surgery

## 2015-11-11 ENCOUNTER — Encounter: Payer: Medicare Other | Attending: General Surgery | Admitting: General Surgery

## 2015-11-11 DIAGNOSIS — M1990 Unspecified osteoarthritis, unspecified site: Secondary | ICD-10-CM | POA: Insufficient documentation

## 2015-11-11 DIAGNOSIS — S81811A Laceration without foreign body, right lower leg, initial encounter: Secondary | ICD-10-CM | POA: Insufficient documentation

## 2015-11-11 DIAGNOSIS — D649 Anemia, unspecified: Secondary | ICD-10-CM | POA: Diagnosis not present

## 2015-11-11 DIAGNOSIS — I10 Essential (primary) hypertension: Secondary | ICD-10-CM | POA: Diagnosis not present

## 2015-11-11 DIAGNOSIS — S81821A Laceration with foreign body, right lower leg, initial encounter: Secondary | ICD-10-CM

## 2015-11-11 DIAGNOSIS — X58XXXA Exposure to other specified factors, initial encounter: Secondary | ICD-10-CM | POA: Diagnosis not present

## 2015-11-11 NOTE — Progress Notes (Addendum)
[name] Cooper, [name] Cooper ([id number]) Visit Report for 11/11/2015 Allergy List Details Patient Name: [name] Cooper, [name] Cooper Date of Service: 11/11/2015 8:45 AM Medical Record Number: [id number] Patient Account Number: [id number] Date of Birth/Sex: 07-14-1940 (75 y.o. Female) Treating RN: Clover MealyAfful, RN, BSN, [place] Primary Care Physician: Jerl MinaHedrick, James Other Clinician: Referring Physician: Jerl MinaHedrick, James Treating Physician/Extender: Ardath SaxPARKER, PETER Weeks in Treatment: 0 Allergies Active Allergies sulfur Allergy Notes Electronic Signature(s) Signed: 11/11/2015 2:51:54 PM By: Elpidio EricAfful, Rita BSN, RN Entered By: Elpidio EricAfful, Rita on 11/11/2015 08:54:34 [name] Cooper, [name] Cooper ([id number]) -------------------------------------------------------------------------------- Arrival Information Details Patient Name: [name] Cooper, [name] Cooper Date of Service: 11/11/2015 8:45 AM Medical Record Number: [id number] Patient Account Number: [id number] Date of Birth/Sex: 07-14-1940 (75 y.o. Female) Treating RN: Clover MealyAfful, RN, BSN, [place] Primary Care Physician: Jerl MinaHedrick, James Other Clinician: Referring Physician: Jerl MinaHedrick, James Treating Physician/Extender: Elayne SnarePARKER, PETER Weeks in Treatment: 0 Visit Information Patient Arrived: Danella Maiersane Arrival Time: 08:50 Accompanied By: self Transfer Assistance: None Patient Identification Verified: Yes Secondary Verification Process Completed: Yes Patient Requires Transmission-Based No Precautions: Patient Has Alerts: No Electronic Signature(s) Signed: 11/11/2015 2:51:54 PM By: Elpidio EricAfful, Rita BSN, RN Entered By: Elpidio EricAfful, Rita on 11/11/2015 08:56:26 [name] Cooper, [name] Cooper ([id number]) -------------------------------------------------------------------------------- Clinic Level of Care Assessment Details Patient Name: [name] Cooper, [name] Cooper Date of Service: 11/11/2015 8:45 AM Medical Record Number: [id number] Patient Account Number: [id number] Date of Birth/Sex: 07-14-1940 (75 y.o. Female) Treating RN: Curtis Sitesorthy, Joanna Primary Care  Physician: Jerl MinaHedrick, James Other Clinician: Referring Physician: Jerl MinaHedrick, James Treating Physician/Extender: Elayne SnarePARKER, PETER Weeks in Treatment: 0 Clinic Level of Care Assessment Items TOOL 1 Quantity Score []  - Use when EandM and Procedure is performed on INITIAL visit 0 ASSESSMENTS - Nursing Assessment / Reassessment X - General Physical Exam (combine w/ comprehensive assessment (listed just 1 20 below) when performed on new pt. evals) X - Comprehensive Assessment (HX, ROS, Risk Assessments, Wounds Hx, etc.) 1 25 ASSESSMENTS - Wound and Skin Assessment / Reassessment []  - Dermatologic / Skin Assessment (not related to wound area) 0 ASSESSMENTS - Ostomy and/or Continence Assessment and Care []  - Incontinence Assessment and Management 0 []  - Ostomy Care Assessment and Management (repouching, etc.) 0 PROCESS - Coordination of Care X - Simple Patient / Family Education for ongoing care 1 15 []  - Complex (extensive) Patient / Family Education for ongoing care 0 X - Staff obtains ChiropractorConsents, Records, Test Results / Process Orders 1 10 []  - Staff telephones HHA, Nursing Homes / Clarify orders / etc 0 []  - Routine Transfer to another Facility (non-emergent condition) 0 []  - Routine Hospital Admission (non-emergent condition) 0 X - New Admissions / Manufacturing engineernsurance Authorizations / Ordering NPWT, Apligraf, etc. 1 15 []  - Emergency Hospital Admission (emergent condition) 0 PROCESS - Special Needs []  - Pediatric / Minor Patient Management 0 []  - Isolation Patient Management 0 [name] Cooper, [name] Cooper ([id number]) []  - Hearing / Language / Visual special needs 0 []  - Assessment of Community assistance (transportation, D/C planning, etc.) 0 []  - Additional assistance / Altered mentation 0 []  - Support Surface(s) Assessment (bed, cushion, seat, etc.) 0 INTERVENTIONS - Miscellaneous []  - External ear exam 0 []  - Patient Transfer (multiple staff / Nurse, adultHoyer Lift / Similar devices) 0 []  - Simple Staple / Suture  removal (25 or less) 0 []  - Complex Staple / Suture removal (26 or more) 0 []  - Hypo/Hyperglycemic Management (do not check if billed separately) 0 X - Ankle / Brachial Index (ABI) - do not check if billed separately 1 15 Has the patient been seen at the hospital within the last three years: Yes Total Score:  100 Level Of Care: New/Established - Level 3 Electronic Signature(s) Signed: 11/11/2015 4:59:06 PM By: Curtis Sites Entered By: Curtis Sites on 11/11/2015 09:27:06 [name] Cooper ([id number]) -------------------------------------------------------------------------------- Encounter Discharge Information Details Patient Name: [name] Cooper Date of Service: 11/11/2015 8:45 AM Medical Record Number: [id number] Patient Account Number: [id number] Date of Birth/Sex: 10/17/1940 (75 y.o. Female) Treating RN: Clover Mealy, RN, BSN, [place] Sink Primary Care Physician: [name] Other Clinician: Referring Physician: [name] Treating Physician/Extender: Elayne Snare in Treatment: 0 Encounter Discharge Information Items Discharge Pain Level: 0 Discharge Condition: Stable Ambulatory Status: Cane Discharge Destination: Home Private Transportation: Auto Accompanied By: self Schedule Follow-up Appointment: No Medication Reconciliation completed and No provided to Patient/Care Torin Modica: Clinical Summary of Care: Electronic Signature(s) Signed: 11/11/2015 9:40:49 AM By: Elpidio Eric BSN, RN Entered By: Elpidio Eric on 11/11/2015 09:40:49 [name] Cooper ([id number]) -------------------------------------------------------------------------------- Lower Extremity Assessment Details Patient Name: [name] Cooper Date of Service: 11/11/2015 8:45 AM Medical Record Number: [id number] Patient Account Number: [id number] Date of Birth/Sex: [birth year] (75 y.o. Female) Treating RN: Afful, RN, BSN, Rita Primary Care Physician: [name] Other Clinician: Referring Physician: [name] Treating Physician/Extender: Elayne Snare in Treatment: 0 Edema Assessment Assessed: Kyra Searles: No] [Right: No] E[Left: dema] [Right: :] Calf Left: Right: Point of Measurement: 35 cm From Medial Instep 35.6 cm 37.2 cm Ankle Left: Right: Point of Measurement: 9 cm From Medial Instep 25.5 cm 24.5 cm Vascular Assessment Claudication: Claudication Assessment [Left:None] [Right:None] Pulses: Posterior Tibial Palpable: [Left:Yes] [Right:Yes] Doppler: [Left:Multiphasic] [Right:Multiphasic] Dorsalis Pedis Palpable: [Left:Yes] [Right:Yes] Doppler: [Left:Multiphasic] [Right:Multiphasic] Extremity colors, hair growth, and conditions: Extremity Color: [Left:Mottled] [Right:Mottled] Hair Growth on Extremity: [Left:No] [Right:No] Temperature of Extremity: [Left:Warm] [Right:Warm] Capillary Refill: [Left:< 3 seconds] [Right:< 3 seconds] Dependent Rubor: [Left:No] [Right:No] Blanched when Elevated: [Left:No] [Right:No] Lipodermatosclerosis: [Left:No] [Right:No] Blood Pressure: Brachial: [Left:140] [Right:142] Dorsalis Pedis: 160 [Left:Dorsalis Pedis:] Ankle: Posterior Tibial: [Left:Posterior Tibial: 1.13] Toe Nail Assessment [name] Cooper, TUNNELL ([id number]) Left: Right: Thick: No No Discolored: No No Deformed: No No Improper Length and Hygiene: No No Notes Unable to do right ABI due to pain and wound location Electronic Signature(s) Signed: 11/11/2015 2:51:54 PM By: Elpidio Eric BSN, RN Entered By: Elpidio Eric on 11/11/2015 09:14:44 [name] Cooper ([id number]) -------------------------------------------------------------------------------- Multi Wound Chart Details Patient Name: [name] Cooper Date of Service: 11/11/2015 8:45 AM Medical Record Number: [id number] Patient Account Number: [id number] Date of Birth/Sex: April 29, 1940 (75 y.o. Female) Treating RN: Curtis Sites Primary Care Physician: [name] Other Clinician: Referring Physician: [name] Treating  Physician/Extender: Elayne Snare in Treatment: 0 Vital Signs Height(in): 67 Pulse(bpm): 67 Weight(lbs): 174 Blood Pressure 140/66 (mmHg): Body Mass Index(BMI): 27 Temperature(F): 97.8 Respiratory Rate 17 (breaths/min): Photos: [1:No Photos] [N/A:N/A] Wound Location: [1:Right Lower Leg - Lateral] [N/A:N/A] Wounding Event: [1:Trauma] [N/A:N/A] Primary Etiology: [1:Dehisced Wound] [N/A:N/A] Comorbid History: [1:Anemia, Arrhythmia, Hypertension, Osteoarthritis] [N/A:N/A] Date Acquired: [1:10/05/2015] [N/A:N/A] Weeks of Treatment: [1:0] [N/A:N/A] Wound Status: [1:Open] [N/A:N/A] Measurements L x W x D 5x3x0.3 [N/A:N/A] (cm) Area (cm) : [1:11.781] [N/A:N/A] Volume (cm) : [1:3.534] [N/A:N/A] Classification: [1:Full Thickness Without Exposed Support Structures] [N/A:N/A] Exudate Amount: [1:Large] [N/A:N/A] Exudate Type: [1:Serosanguineous] [N/A:N/A] Exudate Color: [1:red, brown] [N/A:N/A] Wound Margin: [1:Distinct, outline attached] [N/A:N/A] Granulation Amount: [1:Small (1-33%)] [N/A:N/A] Necrotic Amount: [1:Large (67-100%)] [N/A:N/A] Necrotic Tissue: [1:Eschar, Adherent Slough] [N/A:N/A] Exposed Structures: [1:Fascia: No Fat: No Tendon: No Muscle: No Joint: No Bone: No] [N/A:N/A] Limited to Skin Breakdown Epithelialization: None N/A N/A Periwound Skin Texture: Edema: Yes N/A N/A Excoriation: No Induration: No Callus: No Crepitus: No Fluctuance: No Friable: No Rash: No  Scarring: No Periwound Skin Moist: Yes N/A N/A Moisture: Maceration: No Dry/Scaly: No Periwound Skin Color: Atrophie Blanche: No N/A N/A Cyanosis: No Ecchymosis: No Erythema: No Hemosiderin Staining: No Mottled: No Pallor: No Rubor: No Temperature: No Abnormality N/A N/A Tenderness on Yes N/A N/A Palpation: Wound Preparation: Ulcer Cleansing: N/A N/A Rinsed/Irrigated with Saline Topical Anesthetic Applied: Other: lidocaine 4% Treatment Notes Electronic Signature(s) Signed:  11/11/2015 4:59:06 PM By: Curtis Sites Entered By: Curtis Sites on 11/11/2015 09:23:33 [name] Cooper ([id number]) -------------------------------------------------------------------------------- Multi-Disciplinary Care Plan Details Patient Name: [name] Cooper Date of Service: 11/11/2015 8:45 AM Medical Record Number: [id number] Patient Account Number: [id number] Date of Birth/Sex: January 23, 1941 (75 y.o. Female) Treating RN: Curtis Sites Primary Care Physician: [name] Other Clinician: Referring Physician: [name] Treating Physician/Extender: Elayne Snare in Treatment: 0 Active Inactive Orientation to the Wound Care Program Nursing Diagnoses: Knowledge deficit related to the wound healing center program Goals: Patient/caregiver will verbalize understanding of the Wound Healing Center Program Date Initiated: 11/11/2015 Goal Status: Active Interventions: Provide education on orientation to the wound center Notes: Wound/Skin Impairment Nursing Diagnoses: Impaired tissue integrity Goals: Patient/caregiver will verbalize understanding of skin care regimen Date Initiated: 11/11/2015 Goal Status: Active Ulcer/skin breakdown will have a volume reduction of 30% by week 4 Date Initiated: 11/11/2015 Goal Status: Active Ulcer/skin breakdown will have a volume reduction of 50% by week 8 Date Initiated: 11/11/2015 Goal Status: Active Ulcer/skin breakdown will have a volume reduction of 80% by week 12 Date Initiated: 11/11/2015 Goal Status: Active Ulcer/skin breakdown will heal within 14 weeks Date Initiated: 11/11/2015 Goal Status: Active SHALIN, LINDERS ([id number]) Interventions: Assess patient/caregiver ability to obtain necessary supplies Assess patient/caregiver ability to perform ulcer/skin care regimen upon admission and as needed Assess ulceration(s) every visit Notes: Electronic Signature(s) Signed: 11/11/2015 4:59:06 PM By: Curtis Sites Entered By:  Curtis Sites on 11/11/2015 09:22:57 [name] Cooper ([id number]) -------------------------------------------------------------------------------- Pain Assessment Details Patient Name: [name] Cooper Date of Service: 11/11/2015 8:45 AM Medical Record Number: [id number] Patient Account Number: [id number] Date of Birth/Sex: 11/08/1940 (75 y.o. Female) Treating RN: Clover Mealy, RN, BSN, [place] Sink Primary Care Physician: [name] Other Clinician: Referring Physician: [name] Treating Physician/Extender: Elayne Snare in Treatment: 0 Active Problems Location of Pain Severity and Description of Pain Patient Has Paino No Site Locations With Dressing Change: No Rate the pain. Current Pain Level: 5 Character of Pain Describe the Pain: Aching, Burning, Tender, Throbbing Pain Management and Medication Current Pain Management: Medication: Yes Rest: Yes Activity: Yes How does your pain impact your activities of daily livingo Sleep: Yes Bathing: Yes Appetite: Yes Relationship With Others: Yes Bladder Continence: Yes Emotions: Yes Bowel Continence: Yes Work: Yes Toileting: Yes Drive: Yes Dressing: Yes Hobbies: Yes Electronic Signature(s) Signed: 11/11/2015 2:51:54 PM By: Elpidio Eric BSN, RN Entered By: Elpidio Eric on 11/11/2015 08:55:02 [name] Cooper ([id number]) [name] Cooper ([id number]) -------------------------------------------------------------------------------- Patient/Caregiver Education Details Patient Name: [name] Cooper Date of Service: 11/11/2015 8:45 AM Medical Record Number: [id number] Patient Account Number: [id number] Date of Birth/Gender: 1940/11/06 (75 y.o. Female) Treating RN: Clover Mealy, RN, BSN,  [place] Primary Care Physician: [name] Other Clinician: Referring Physician: [name] Treating Physician/Extender: Elayne Snare in Treatment: 0 Education Assessment Education Provided To: Patient Education Topics Provided Basic  Hygiene: Methods: Explain/Verbal Responses: State content correctly Welcome To The Wound Care Center: Methods: Explain/Verbal Responses: State content correctly Wound Debridement: Methods: Explain/Verbal Responses: State content correctly Wound/Skin Impairment: Methods: Explain/Verbal Responses: State content correctly Electronic Signature(s) Signed: 11/11/2015 2:51:54 PM By: Elpidio Eric BSN, RN Entered By:  Elpidio Ericfful, Rita on 11/11/2015 09:41:08 [name] Cooper, Jacaria ([id number]) -------------------------------------------------------------------------------- Wound Assessment Details Patient Name: [name] Cooper, Maxwell Date of Service: 11/11/2015 8:45 AM Medical Record Number: [id number] Patient Account Number: [id number] Date of Birth/Sex: 29-Nov-1940 (75 y.o. Female) Treating RN: Afful, RN, BSN, Rita Primary Care Physician: Jerl MinaHedrick, James Other Clinician: Referring Physician: Jerl MinaHedrick, James Treating Physician/Extender: Ardath SaxPARKER, PETER Weeks in Treatment: 0 Wound Status Wound Number: 1 Primary Dehisced Wound Etiology: Wound Location: Right Lower Leg - Lateral Wound Status: Open Wounding Event: Trauma Comorbid Anemia, Arrhythmia, Hypertension, Date Acquired: 10/05/2015 History: Osteoarthritis Weeks Of Treatment: 0 Clustered Wound: No Photos Photo Uploaded By: Elpidio EricAfful, Rita on 11/11/2015 11:44:21 Wound Measurements Length: (cm) 5 Width: (cm) 3 Depth: (cm) 0.3 Area: (cm) 11.781 Volume: (cm) 3.534 % Reduction in Area: % Reduction in Volume: Epithelialization: None Tunneling: No Undermining: No Wound Description Full Thickness Without Exposed Classification: Support Structures Wound Margin: Distinct, outline attached Exudate Large Amount: Exudate Type: Serosanguineous Exudate Color: red, brown Foul Odor After Cleansing: No Wound Bed Granulation Amount: Small (1-33%) Exposed Structure Necrotic Amount: Large (67-100%) Fascia Exposed: No Necrotic Quality: Eschar, Adherent  Slough Fat Layer Exposed: No Szczerba, Epifania ([id number]) Tendon Exposed: No Muscle Exposed: No Joint Exposed: No Bone Exposed: No Limited to Skin Breakdown Periwound Skin Texture Texture Color No Abnormalities Noted: No No Abnormalities Noted: No Callus: No Atrophie Blanche: No Crepitus: No Cyanosis: No Excoriation: No Ecchymosis: No Fluctuance: No Erythema: No Friable: No Hemosiderin Staining: No Induration: No Mottled: No Localized Edema: Yes Pallor: No Rash: No Rubor: No Scarring: No Temperature / Pain Moisture Temperature: No Abnormality No Abnormalities Noted: No Tenderness on Palpation: Yes Dry / Scaly: No Maceration: No Moist: Yes Wound Preparation Ulcer Cleansing: Rinsed/Irrigated with Saline Topical Anesthetic Applied: Other: lidocaine 4%, Treatment Notes Wound #1 (Right, Lateral Lower Leg) 1. Cleansed with: Clean wound with Normal Saline 4. Dressing Applied: Aquacel Ag 5. Secondary Dressing Applied Bordered Foam Dressing Dry Gauze Electronic Signature(s) Signed: 11/11/2015 2:51:54 PM By: Elpidio EricAfful, Rita BSN, RN Entered By: Elpidio EricAfful, Rita on 11/11/2015 09:04:25 [name] Cooper, Margherita ([id number]) -------------------------------------------------------------------------------- Vitals Details Patient Name: [name] Cooper, Lunabella Date of Service: 11/11/2015 8:45 AM Medical Record Number: [id number] Patient Account Number: [id number] Date of Birth/Sex: 29-Nov-1940 (75 y.o. Female) Treating RN: Afful, RN, BSN, Rita Primary Care Physician: Jerl MinaHedrick, James Other Clinician: Referring Physician: Jerl MinaHedrick, James Treating Physician/Extender: Elayne SnarePARKER, PETER Weeks in Treatment: 0 Vital Signs Time Taken: 08:55 Temperature (F): 97.8 Height (in): 67 Pulse (bpm): 67 Source: Stated Respiratory Rate (breaths/min): 17 Weight (lbs): 174 Blood Pressure (mmHg): 140/66 Source: Measured Reference Range: 80 - 120 mg / dl Body Mass Index (BMI): 27.2 Electronic Signature(s) Signed:  11/11/2015 2:51:54 PM By: Elpidio EricAfful, Rita BSN, RN Entered By: Elpidio EricAfful, Rita on 11/11/2015 08:56:03

## 2015-11-11 NOTE — Progress Notes (Addendum)
[name] Cooper, [name] Cooper ([id number]) Visit Report for 11/11/2015 Chief Complaint Document Details Patient Name: [name] Cooper, [name] Cooper Date of Service: 11/11/2015 8:45 AM Medical Record Patient Account Number: [id number] Number: [name] Cooper, Treating RN: Oct 01, 1940 (75 y.o. [name] Date of Birth/Sex: Female) Other Clinician: Primary Care Physician: [name] Cooper, [name] Treating [name] Pollman Referring Physician: Jerl MinaHedrick, [name] Physician/Extender: [name] Cooper in Treatment: 0 Information Obtained from: Patient Electronic Signature(s) Signed: 11/11/2015 12:45:20 PM By: [name] Cooper, [name] Tassin MD Previous Signature: 11/11/2015 8:12:25 AM Version By: [name] Cooper, [name] Stimmel MD Entered By: [name] Cooper, Decklyn Hyder on 11/11/2015 12:45:20 [name] Cooper, [name] Cooper ([id number]) -------------------------------------------------------------------------------- Debridement Details Patient Name: [name] Cooper, [name] Cooper Date of Service: 11/11/2015 8:45 AM Medical Record Number: [id number] Patient Account Number: [id number] Date of Birth/Sex: Oct 01, 1940 (75 y.o. Female) Treating RN: [name] Cooper, [name] Primary Care Physician: [name] MinaHedrick, [name] Other Clinician: Referring Physician: Jerl MinaHedrick, [name] Treating Physician/Extender: [name] Cooper, Anisten Cooper Weeks in Treatment: 0 Debridement Performed for Wound #1 Right,Lateral Lower Leg Assessment: Performed By: Physician [name] Cooper, Jeramia Saleeby, MD Debridement: Debridement Pre-procedure Yes Verification/Time Out Taken: Start Time: 09:21 Pain Control: Lidocaine 4% Topical Solution Level: Skin/Subcutaneous Tissue Total Area Debrided (L x 5 (cm) x 3 (cm) = 15 (cm) W): Tissue and other Viable, Non-Viable, Eschar, Fibrin/Slough, Subcutaneous material debrided: Instrument: Blade, Curette, Forceps Bleeding: Minimum Hemostasis Achieved: Pressure End Time: 09:24 Procedural Pain: 0 Post Procedural Pain: 0 Response to Treatment: Procedure was tolerated well Post Debridement Measurements of Total Wound Length: (cm) 5 Width: (cm) 3 Depth:  (cm) 0.4 Volume: (cm) 4.712 Post Procedure Diagnosis Same as Pre-procedure Electronic Signature(s) Signed: 11/11/2015 4:57:01 PM By: [name] Cooper, Khalil Belote MD Signed: 11/11/2015 4:59:06 PM By: [name] Cooper, [name] Entered By: [name] Cooper, [name] on 11/11/2015 09:24:35 [name] Cooper, [name] Cooper ([id number]) -------------------------------------------------------------------------------- HPI Details Patient Name: [name] Cooper, [name] Cooper Date of Service: 11/11/2015 8:45 AM Medical Record Patient Account Number: [id number] Number: [name] Cooper, Treating RN: Oct 01, 1940 (75 y.o. [name] Date of Birth/Sex: Female) Other Clinician: Primary Care Physician: [name] Cooper, [name] Treating [name] Cooper Referring Physician: Jerl MinaHedrick, [name] Physician/Extender: [name] Cooper in Treatment: 0 History of Present Illness Location: Patient has an traumatic ulcer on her right leg Quality: Patient reports Tenderness to the affected area(s). Severity: Patient states wound are getting worse. Duration: Patient states the wound has been present for about 2 months [facility] Timing: She verbalized constant pain with periods of intermittent uneasiness Context: The wound occurred when she hit her leg against an object Modifying Factors: patient had sutures applied on the initial encounter, was removed and wound dehisced Associated Signs and Symptoms: Patient reports presence of swelling HPI Description: This is a 75 year old African American female who presented today to the wound clinic for further evaluation of a non-healing ulcer which resulted form trauma. She is alert and oriented x 4, pleasant and seemingly good historian. Appears calm and no acute distress noted. Her history includes but not limited to hypertension, Angina Pectoris, CAD, fibromyalgia. Wears glasses, ambulates with a cane. Wound debrided during this visit, Will use aquacel ag and bordered foam dressing. Will see patient back on a weekly basis. Patient  verbalized understanding of instructions. Electronic Signature(s) Signed: 11/15/2015 4:21:51 PM By: [name] Cooper, [name] BSN, RN Signed: 12/02/2015 2:47:38 PM By: [name] Cooper, [name] Spirito MD Previous Signature: 11/11/2015 12:45:51 PM Version By: [name] Cooper, [name] Tullos MD Previous Signature: 11/11/2015 8:12:32 AM Version By: [name] Cooper, [name] Jagiello MD Entered By: [name] Cooper, [name] on 11/15/2015 16:11:45 [name] Cooper, [name] Cooper ([id number]) -------------------------------------------------------------------------------- Physical Exam Details Patient Name: [name] Cooper, [name] Cooper Date of Service: 11/11/2015 8:45 AM Medical Record Patient Account Number: [id number] Number: [name] Cooper, Treating RN: Oct 01, 1940 (75  y.o. [place] S[place] Date of Birth/Sex: Female) Other Clinician: Primary Care Physician: [name] Monday, [name] Cooper Referring Physician: Jerl Cooper Physician/Extender: [name] Ade in Treatment: 0 Electronic Signature(s) Signed: 11/11/2015 12:46:01 PM By: [name] Sax MD Previous Signature: 11/11/2015 8:12:39 AM Version By: [name] Sax MD Entered By: [name] Cooper on 11/11/2015 12:46:01 [name] Cooper ([id number]) -------------------------------------------------------------------------------- Physician Orders Details Patient Name: [name] Cooper Date of Service: 11/11/2015 8:45 AM Medical Record Number: [id number] Patient Account Number: [id number] Date of Birth/Sex: [birth year] (75 y.o. Female) Treating RN: [name] Cooper Primary Care Physician: [name] Cooper Other Clinician: Referring Physician: Jerl Cooper Treating Physician/Extender: [name] Cooper in Treatment: 0 Verbal / Phone Orders: Yes Clinician: Curtis Cooper Read Back and Verified: Yes Diagnosis Coding Wound Cleansing Wound #1 Right,Lateral Lower Leg o Clean wound with Normal Saline. Anesthetic Wound #1 Right,Lateral Lower Leg o Topical Lidocaine 4% cream applied to wound bed prior to debridement Skin Barriers/Peri-Wound Care Wound #1  Right,Lateral Lower Leg o Skin Prep Primary Wound Dressing Wound #1 Right,Lateral Lower Leg o Aquacel Ag Secondary Dressing Wound #1 Right,Lateral Lower Leg o Boardered Foam Dressing Dressing Change Frequency Wound #1 Right,Lateral Lower Leg o Change dressing every other day. Follow-up Appointments Wound #1 Right,Lateral Lower Leg o Return Appointment in 1 week. Edema Control Wound #1 Right,Lateral Lower Leg o Elevate legs to the level of the heart and pump ankles as often as possible Electronic Signature(s) AZUSENA, ERLANDSON ([id number]) Signed: 11/11/2015 4:57:01 PM By: [name] Sax MD Signed: 11/11/2015 4:59:06 PM By: [name] Cooper Entered By: [name] Cooper on 11/11/2015 09:26:12 [name] Cooper ([id number]) -------------------------------------------------------------------------------- Problem List Details Patient Name: [name] Cooper Date of Service: 11/11/2015 8:45 AM Medical Record Patient Account Number: [id number] Number: [name] Cooper, Treating RN: 1940/12/22 (75 y.o. [place] Sink Date of Birth/Sex: Female) Other Clinician: Primary Care Physician: [name] Monday, Kaelan Emami Referring Physician: Jerl Cooper Physician/Extender: [name] Ade in Treatment: 0 Active Problems Inactive Problems Resolved Problems Electronic Signature(s) Signed: 11/11/2015 12:45:02 PM By: [name] Sax MD Previous Signature: 11/11/2015 8:12:14 AM Version By: [name] Sax MD Entered By: [name] Cooper on 11/11/2015 12:45:02 [name] Cooper ([id number]) -------------------------------------------------------------------------------- Progress Note Details Patient Name: [name] Cooper Date of Service: 11/11/2015 8:45 AM Medical Record Patient Account Number: [id number] Number: [name] Cooper, Treating RN: 1941/03/14 (75 y.o. [place] Sink Date of Birth/Sex: Female) Other Clinician: Primary Care Physician: [name] Monday, Reesa Gotschall Referring  Physician: Jerl Cooper Physician/Extender: [name] Ade in Treatment: 0 Subjective Chief Complaint Information obtained from Patient History of Present Illness (HPI) The following HPI elements were documented for the patient's wound: Location: Patient has an traumatic ulcer on her right leg Quality: Patient reports Tenderness to the affected area(s). Severity: Patient states wound are getting worse. Duration: Patient states the wound has been present for about 2 months prior to seeking help from the [facility] Timing: She verbalized constant pain with periods of intermittent uneasiness Context: The wound occurred when she hit her leg against an object Modifying Factors: patient had sutures applied on the initial encounter, was removed and wound dehisced Associated Signs and Symptoms: Patient reports presence of swelling This is a 75 year old African American female who presented today to the wound clinic for further evaluation of a non-healing ulcer which resulted form trauma. She is alert and oriented x 4, pleasant and seemingly good historian. Appears calm and no acute distress noted. Her history includes but not limited to hypertension, Angina Pectoris, CAD, fibromyalgia. Wears glasses, ambulates with a cane. Wound debrided during this visit, Will use aquacel ag and bordered foam  dressing. Will see patient back on a weekly basis. Patient verbalized understanding of instructions. Wound History Patient presents with 1 open wound that has been present for approximately 82month. Patient has been treating wound in the following manner: alginate. Laboratory tests have not been performed in the last month. Patient reportedly has not tested positive for an antibiotic resistant organism. Patient reportedly has not tested positive for osteomyelitis. Patient reportedly has not had testing performed to evaluate circulation in the legs. Patient experiences the following problems associated with their wounds:  infection. Patient History Information obtained from Patient, Caregiver. Allergies sulfur Family History [name] Cooper, [name] Cooper ([id number]) Diabetes - Siblings, Heart Disease - Father, Mother, Hereditary Spherocytosis - Siblings, Siblings, Hypertension - Siblings, Siblings, Seizures - Siblings, No family history of Cancer, Kidney Disease, Lung Disease, Stroke, Thyroid Problems, Tuberculosis. Social History Never smoker, Marital Status - Widowed, Alcohol Use - Never, Caffeine Use - Rarely. Medical History Eyes Denies history of Cataracts, Glaucoma, Optic Neuritis Ear/Nose/Mouth/Throat Denies history of Chronic sinus problems/congestion, Middle ear problems Hematologic/Lymphatic Patient has history of Anemia Denies history of Hemophilia, Human Immunodeficiency Virus, Lymphedema, Sickle Cell Disease Respiratory Denies history of Aspiration, Asthma, Chronic Obstructive Pulmonary Disease (COPD), Pneumothorax, Sleep Apnea, Tuberculosis Cardiovascular Patient has history of Arrhythmia, Hypertension Gastrointestinal Denies history of Cirrhosis , Colitis, Crohn s, Hepatitis A, Hepatitis B, Hepatitis C Endocrine Denies history of Type I Diabetes, Type II Diabetes Genitourinary Denies history of End Stage Renal Disease Immunological Denies history of Lupus Erythematosus, Raynaud s, Scleroderma Integumentary (Skin) Denies history of History of Burn, History of pressure wounds Musculoskeletal Patient has history of Osteoarthritis Neurologic Denies history of Dementia, Neuropathy, Quadriplegia, Paraplegia, Seizure Disorder Oncologic Denies history of Received Chemotherapy, Received Radiation Psychiatric Denies history of Anorexia/bulimia, Confinement Anxiety Review of Systems (ROS) Constitutional Symptoms (General Health) The patient has no complaints or symptoms. Eyes Complains or has symptoms of Glasses / Contacts. Ear/Nose/Mouth/Throat The patient has no complaints or  symptoms. Hematologic/Lymphatic The patient has no complaints or symptoms. Respiratory The patient has no complaints or symptoms. Cardiovascular [name] Cooper, Kymari ([id number]) The patient has no complaints or symptoms. Gastrointestinal The patient has no complaints or symptoms. Endocrine The patient has no complaints or symptoms. Genitourinary The patient has no complaints or symptoms. Immunological The patient has no complaints or symptoms. Integumentary (Skin) Complains or has symptoms of Wounds. Musculoskeletal The patient has no complaints or symptoms. Neurologic The patient has no complaints or symptoms. Oncologic The patient has no complaints or symptoms. Psychiatric The patient has no complaints or symptoms. Assessment Procedures Old laceration lateral right leg 4619m length. Debrided and dressed with silver alginate Electronic Signature(s) Signed: 11/15/2015 4:21:51 PM By: [name] Cooper, [name] BSN, RN Signed: 12/02/2015 2:47:38 PM By: [name] Cooper, Marylin Lathon MD Previous Signature: 11/11/2015 12:47:40 PM Version By: [name] Cooper, Taia Bramlett MD Previous Signature: 11/11/2015 8:12:51 AM Version By: [name] Cooper, Philicia Heyne MD Entered By: [name] Cooper, [name] on 11/15/2015 16:12:32 [name] Cooper, Reeve ([id number]) -------------------------------------------------------------------------------- ROS/PFSH Details Patient Name: [name] Cooper, Judythe Date of Service: 11/11/2015 8:45 AM Medical Record Patient Account Number: [id number] Number: [name] Cooper, Treating RN: Aug 23, 1940 (75 y.o. [place] Sinkita Date of Birth/Sex: Female) Other Clinician: Primary Care Physician: [name] Cooper, [name] Treating Maddyson Keil Referring Physician: Jerl MinaHedrick, [name] Physician/Extender: [name] Cooper in Treatment: 0 Information Obtained From Patient Caregiver Wound History Do you currently have one or more open woundso Yes How many open wounds do you currently haveo 1 Approximately how long have you had your woundso 82month How have you been treating your  wound(s) until nowo alginate Has your wound(s) ever healed and  then re-openedo No Have you had any lab work done in the past montho No Have you tested positive for an antibiotic resistant organism (MRSA, VRE)o No Have you tested positive for osteomyelitis (bone infection)o No Have you had any tests for circulation on your legso No Have you had other problems associated with your woundso Infection Eyes Complaints and Symptoms: Positive for: Glasses / Contacts Medical History: Negative for: Cataracts; Glaucoma; Optic Neuritis Integumentary (Skin) Complaints and Symptoms: Positive for: Wounds Medical History: Negative for: History of Burn; History of pressure wounds Constitutional Symptoms (General Health) Complaints and Symptoms: No Complaints or Symptoms Ear/Nose/Mouth/Throat Complaints and Symptoms: No Complaints or Symptoms ADONICA, FUKUSHIMA ([id number]) Medical History: Negative for: Chronic sinus problems/congestion; Middle ear problems Hematologic/Lymphatic Complaints and Symptoms: No Complaints or Symptoms Medical History: Positive for: Anemia Negative for: Hemophilia; Human Immunodeficiency Virus; Lymphedema; Sickle Cell Disease Respiratory Complaints and Symptoms: No Complaints or Symptoms Medical History: Negative for: Aspiration; Asthma; Chronic Obstructive Pulmonary Disease (COPD); Pneumothorax; Sleep Apnea; Tuberculosis Cardiovascular Complaints and Symptoms: No Complaints or Symptoms Medical History: Positive for: Arrhythmia; Hypertension Gastrointestinal Complaints and Symptoms: No Complaints or Symptoms Medical History: Negative for: Cirrhosis ; Colitis; Crohnos; Hepatitis A; Hepatitis B; Hepatitis C Endocrine Complaints and Symptoms: No Complaints or Symptoms Medical History: Negative for: Type I Diabetes; Type II Diabetes Genitourinary Complaints and Symptoms: No Complaints or Symptoms Medical HistoryTULEEN, MANDELBAUM ([id number]) Negative for:  End Stage Renal Disease Immunological Complaints and Symptoms: No Complaints or Symptoms Medical History: Negative for: Lupus Erythematosus; Raynaudos; Scleroderma Musculoskeletal Complaints and Symptoms: No Complaints or Symptoms Medical History: Positive for: Osteoarthritis Neurologic Complaints and Symptoms: No Complaints or Symptoms Medical History: Negative for: Dementia; Neuropathy; Quadriplegia; Paraplegia; Seizure Disorder Oncologic Complaints and Symptoms: No Complaints or Symptoms Medical History: Negative for: Received Chemotherapy; Received Radiation Psychiatric Complaints and Symptoms: No Complaints or Symptoms Medical History: Negative for: Anorexia/bulimia; Confinement Anxiety Family and Social History Cancer: No; Diabetes: Yes - Siblings; Heart Disease: Yes - Father, Mother; Hereditary Spherocytosis: Yes - Siblings, Siblings; Hypertension: Yes - Siblings, Siblings; Kidney Disease: No; Lung Disease: No; Seizures: Yes - Siblings; Stroke: No; Thyroid Problems: No; Tuberculosis: No; Never smoker; Marital Status - Widowed; Alcohol Use: Never; Caffeine Use: Rarely; Financial Concerns: No; Food, Clothing or Shelter Needs: No; Support System Lacking: No; Transportation Concerns: No; Advanced Directives: No; Patient wants information on Advanced Directives; Living Will: No Electronic Signature(s) JAMELL, OPFER ([id number]) Signed: 11/11/2015 2:51:54 PM By: [name] Eric BSN, RN Signed: 11/11/2015 4:57:01 PM By: [name] Sax MD Entered By: [name] Eric on 11/11/2015 09:00:15 [name] Cooper ([id number]) -------------------------------------------------------------------------------- SuperBill Details Patient Name: [name] Cooper Date of Service: 11/11/2015 Medical Record Patient Account Number: [id number] Number: [name] Cooper, Treating RN: [birth year] (75 y.o. [place] Sink Date of Birth/Sex: Female) Other Clinician: Primary Care Physician: [name] Monday, Reianna Batdorf Referring Physician: Jerl Cooper Physician/Extender: [name] Ade in Treatment: 0 Diagnosis Coding ICD-10 Codes Code Description [id number] Laceration without foreign body, right lower leg, initial encounter Facility Procedures CPT4 Code Description: 13244010 99213 - WOUND CARE VISIT-LEV 3 EST PT Modifier: Quantity: 1 CPT4 Code Description: [id number] 11042 - DEB SUBQ TISSUE 20 SQ CM/< ICD-10 Description Diagnosis S81.811A Laceration without foreign body, right lower leg, init Modifier: ial encounte Quantity: 1 r Physician Procedures CPT4 Code Description: 4034742 59563 - WC PHYS LEVEL 2 - NEW PT ICD-10 Description Diagnosis S81.811A Laceration without foreign body, right lower leg, init Modifier: ial encounte Quantity: 1 r CPT4 Code Description: [id number] 11042 - WC PHYS SUBQ TISS 20 SQ CM  ICD-10 Description Diagnosis S81.811A Laceration without foreign body, right lower leg, init Modifier: ial encounte Quantity: 1 r Electronic Signature(s) Signed: 11/11/2015 12:52:12 PM By: [name] Sax MD Entered By: [name] Cooper on 11/11/2015 12:52:12

## 2015-11-11 NOTE — Progress Notes (Signed)
seeihealnote 

## 2015-11-12 NOTE — Progress Notes (Signed)
[name] Cooper, Serenna ([id number]) Visit Report for 11/11/2015 Abuse/Suicide Risk Screen Details Patient Name: [name] Cooper, [name] Cooper Date of Service: 11/11/2015 8:45 AM Medical Record Patient Account Number: [id number] Number: Afful, RN, BSN, Treating RN: 05-Nov-1940 (75 y.o. [place] Date of Birth/Sex: Female) Other Clinician: Primary Care Physician: Alvira MondayHedrick, James Treating PARKER, PETER Referring Physician: Jerl MinaHedrick, James Physician/Extender: Tania AdeWeeks in Treatment: 0 Abuse/Suicide Risk Screen Items Answer ABUSE/SUICIDE RISK SCREEN: Has anyone close to you tried to hurt or harm you recentlyo No Do you feel uncomfortable with anyone in your familyo No Has anyone forced you do things that you didnot want to doo No Do you have any thoughts of harming yourselfo No Patient displays signs or symptoms of abuse and/or neglect. No Electronic Signature(s) Signed: 11/11/2015 2:51:54 PM By: Elpidio EricAfful, Rita BSN, RN Entered By: Elpidio EricAfful, Rita on 11/11/2015 08:52:42 [name] Cooper, Jonique ([id number]) -------------------------------------------------------------------------------- Activities of Daily Living Details Patient Name: [name] Cooper, [name] Cooper Date of Service: 11/11/2015 8:45 AM Medical Record Patient Account Number: [id number] Number: Afful, RN, BSN, Treating RN: 05-Nov-1940 (75 y.o. [place] Date of Birth/Sex: Female) Other Clinician: Primary Care Physician: Alvira MondayHedrick, James Treating PARKER, PETER Referring Physician: Jerl MinaHedrick, James Physician/Extender: Tania AdeWeeks in Treatment: 0 Activities of Daily Living Items Answer Activities of Daily Living (Please select one for each item) Drive Automobile Completely Able Take Medications Completely Able Use Telephone Completely Able Care for Appearance Completely Able Use Toilet Completely Able Bath / Shower Completely Able Dress Self Completely Able Feed Self Completely Able Walk Completely Able Get In / Out Bed Completely Able Housework Completely Able Prepare  Meals Completely Able Handle Money Completely Able Shop for Self Completely Able Electronic Signature(s) Signed: 11/11/2015 2:51:54 PM By: Elpidio EricAfful, Rita BSN, RN Entered By: Elpidio EricAfful, Rita on 11/11/2015 08:53:12 [name] Cooper, Elois ([id number]) -------------------------------------------------------------------------------- Education Assessment Details Patient Name: [name] Cooper, [name] Cooper Date of Service: 11/11/2015 8:45 AM Medical Record Patient Account Number: [id number] Number: Afful, RN, BSN, Treating RN: 05-Nov-1940 (75 y.o. [place] Date of Birth/Sex: Female) Other Clinician: Primary Care Physician: Alvira MondayHedrick, James Treating PARKER, PETER Referring Physician: Jerl MinaHedrick, James Physician/Extender: Tania AdeWeeks in Treatment: 0 Primary Learner Assessed: Patient Learning Preferences/Education Level/Primary Language Learning Preference: Explanation Highest Education Level: College or Above Physical Barrier Assessment Impaired Vision: Yes Glasses Impaired Hearing: No Decreased Hand dexterity: No Knowledge/Comprehension Assessment Knowledge Level: High Comprehension Level: High Ability to understand written High instructions: Ability to understand verbal High instructions: Motivation Assessment Anxiety Level: Calm Cooperation: Cooperative Education Importance: Acknowledges Need Interest in Health Problems: Asks Questions Perception: Coherent Willingness to Engage in Self- High Management Activities: Readiness to Engage in Self- High Management Activities: Electronic Signature(s) Signed: 11/11/2015 2:51:54 PM By: Elpidio EricAfful, Rita BSN, RN Entered By: Elpidio EricAfful, Rita on 11/11/2015 08:53:36 [name] Cooper, [name] ([id number]) -------------------------------------------------------------------------------- Fall Risk Assessment Details Patient Name: [name] Cooper, [name] Date of Service: 11/11/2015 8:45 AM Medical Record Patient Account Number: [id number] Number: Afful, RN, BSN, Treating  RN: 05-Nov-1940 (75 y.o. [place] Date of Birth/Sex: Female) Other Clinician: Primary Care Physician: Alvira MondayHedrick, James Treating PARKER, PETER Referring Physician: Jerl MinaHedrick, James Physician/Extender: Tania AdeWeeks in Treatment: 0 Fall Risk Assessment Items Have you had 2 or more falls in the last 12 monthso 0 No Have you had any fall that resulted in injury in the last 12 monthso 0 No FALL RISK ASSESSMENT: History of falling - immediate or within 3 months 0 No Secondary diagnosis 0 No Ambulatory aid None/bed rest/wheelchair/nurse 0 Yes Crutches/cane/walker 0 No Furniture 0 No IV Access/Saline Lock 0 No Gait/Training Normal/bed rest/immobile 0 Yes Weak 0 No Impaired 0 No Mental Status Oriented to  own ability 0 Yes Electronic Signature(s) Signed: 11/11/2015 2:51:54 PM By: Elpidio EricAfful, Rita BSN, RN Entered By: Elpidio EricAfful, Rita on 11/11/2015 08:53:46 [name] Cooper, Deklyn ([id number]) -------------------------------------------------------------------------------- Foot Assessment Details Patient Name: [name] Cooper, [name] Cooper Date of Service: 11/11/2015 8:45 AM Medical Record Patient Account Number: [id number] Number: Afful, RN, BSN, Treating RN: [birth year] (75 y.o. [place] Date of Birth/Sex: Female) Other Clinician: Primary Care Physician: Alvira MondayHedrick, James Treating PARKER, PETER Referring Physician: Jerl MinaHedrick, James Physician/Extender: Tania AdeWeeks in Treatment: 0 Foot Assessment Items Site Locations + = Sensation present, - = Sensation absent, C = Callus, U = Ulcer R = Redness, W = Warmth, M = Maceration, PU = Pre-ulcerative lesion F = Fissure, S = Swelling, D = Dryness Assessment Right: Left: Other Deformity: No No Prior Foot Ulcer: No No Prior Amputation: No No Charcot Joint: No No Ambulatory Status: Ambulatory With Help Assistance Device: Cane Gait: Surveyor, miningUnsteady Electronic Signature(s) Signed: 11/11/2015 2:51:54 PM By: Elpidio EricAfful, Rita BSN, RN Entered By: Elpidio EricAfful, Rita on 11/11/2015 08:54:22 [name] Cooper, Mazella  ([id number]) [name] Cooper, Lequisha ([id number]) -------------------------------------------------------------------------------- Nutrition Risk Assessment Details Patient Name: [name] Cooper, Noemy Date of Service: 11/11/2015 8:45 AM Medical Record Patient Account Number: [id number] Number: Afful, RN, BSN, Treating RN: [birth year] (75 y.o. [place] S[place] Date of Birth/Sex: Female) Other Clinician: Primary Care Physician: Alvira MondayHedrick, James Treating PARKER, PETER Referring Physician: Jerl MinaHedrick, James Physician/Extender: Tania AdeWeeks in Treatment: 0 Height (in): Weight (lbs): Body Mass Index (BMI): Nutrition Risk Assessment Items NUTRITION RISK SCREEN: I have an illness or condition that made me change the kind and/or 0 No amount of food I eat I eat fewer than two meals per day 0 No I eat few fruits and vegetables, or milk products 0 No I have three or more drinks of beer, liquor or wine almost every day 0 No I have tooth or mouth problems that make it hard for me to eat 0 No I don't always have enough money to buy the food I need 0 No I eat alone most of the time 0 No I take three or more different prescribed or over-the-counter drugs a 0 No day Without wanting to, I have lost or gained 10 pounds in the last six 0 No months I am not always physically able to shop, cook and/or feed myself 0 No Nutrition Protocols Good Risk Protocol 0 No interventions needed Moderate Risk Protocol Electronic Signature(s) Signed: 11/11/2015 2:51:54 PM By: Elpidio EricAfful, Rita BSN, RN Entered By: Elpidio EricAfful, Rita on 11/11/2015 08:53:59

## 2015-11-14 ENCOUNTER — Ambulatory Visit: Payer: Medicare Other | Admitting: Surgery

## 2015-11-18 ENCOUNTER — Encounter: Payer: Medicare Other | Admitting: Surgery

## 2015-11-18 DIAGNOSIS — S81811A Laceration without foreign body, right lower leg, initial encounter: Secondary | ICD-10-CM | POA: Diagnosis not present

## 2015-11-19 NOTE — Progress Notes (Signed)
[name] Cooper, [name] Cooper ([id number]) Visit Report for 11/18/2015 Arrival Information Details Patient Name: [name] Cooper, [name] Cooper Date of Service: 11/18/2015 10:00 AM Medical Record Number: [id number] Patient Account Number: [id number] Date of Birth/Sex: 02-16-1941 (75 y.o. Female) Treating RN: Afful, RN, BSN, [place] Primary Care Physician: Jerl MinaHedrick, James Other Clinician: Referring Physician: Jerl MinaHedrick, James Treating Physician/Extender: Rudene ReBritto, Errol Weeks in Treatment: 1 Visit Information History Since Last Visit All ordered tests and consults were completed: No Patient Arrived: Gilmer MorCane Added or deleted any medications: No Arrival Time: 09:46 Any new allergies or adverse reactions: No Accompanied By: self Had a fall or experienced change in No Transfer Assistance: None activities of daily living that may affect Patient Identification Verified: Yes risk of falls: Secondary Verification Process Yes Signs or symptoms of abuse/neglect since last No Completed: visito Patient Requires Transmission-Based No Hospitalized since last visit: No Precautions: Has Dressing in Place as Prescribed: Yes Patient Has Alerts: Yes Pain Present Now: No Patient Alerts: Left ABI 1.13 Electronic Signature(s) Signed: 11/18/2015 3:54:21 PM By: Elpidio EricAfful, Rita BSN, RN Entered By: Elpidio EricAfful, Rita on 11/18/2015 10:10:11 [name] Cooper, [name] Cooper ([id number]) -------------------------------------------------------------------------------- Encounter Discharge Information Details Patient Name: [name] Cooper, [name] Cooper Date of Service: 11/18/2015 10:00 AM Medical Record Number: [id number] Patient Account Number: [id number] Date of Birth/Sex: 02-16-1941 (75 y.o. Female) Treating RN: Clover MealyAfful, RN, BSN, [place] Primary Care Physician: Jerl MinaHedrick, James Other Clinician: Referring Physician: Jerl MinaHedrick, James Treating Physician/Extender: Rudene ReBritto, Errol Weeks in Treatment: 1 Encounter Discharge Information Items Discharge Pain Level: 0 Discharge Condition:  Stable Ambulatory Status: Cane Discharge Destination: Home Transportation: Private Auto Accompanied By: self Schedule Follow-up Appointment: No Medication Reconciliation completed No and provided to Patient/Care Jaqueline Uber: Provided on Clinical Summary of Care: 11/18/2015 Form Type Recipient Paper Patient DB Electronic Signature(s) Signed: 11/18/2015 10:37:14 AM By: Gwenlyn PerkingMoore, Shelia Entered By: Gwenlyn PerkingMoore, Shelia on 11/18/2015 10:37:14 [name] Cooper, [name] Cooper ([id number]) -------------------------------------------------------------------------------- Lower Extremity Assessment Details Patient Name: [name] Cooper, Hailei Date of Service: 11/18/2015 10:00 AM Medical Record Number: [id number] Patient Account Number: [id number] Date of Birth/Sex: 02-16-1941 (75 y.o. Female) Treating RN: Afful, RN, BSN, [place] Primary Care Physician: Jerl MinaHedrick, James Other Clinician: Referring Physician: Jerl MinaHedrick, James Treating Physician/Extender: Rudene ReBritto, Errol Weeks in Treatment: 1 Edema Assessment Assessed: Kyra Searles[Left: No] Franne Forts[Right: No] Edema: [Left: Ye] [Right: s] Calf Left: Right: Point of Measurement: 35 cm From Medial Instep cm 37.5 cm Ankle Left: Right: Point of Measurement: 9 cm From Medial Instep cm 24.5 cm Vascular Assessment Claudication: Claudication Assessment [Right:None] Pulses: Posterior Tibial Dorsalis Pedis Palpable: [Right:Yes] Extremity colors, hair growth, and conditions: Extremity Color: [Right:Mottled] Hair Growth on Extremity: [Right:No] Temperature of Extremity: [Right:Warm] Capillary Refill: [Right:< 3 seconds] Blood Pressure: Brachial: [Right:161] Dorsalis Pedis: [Left:Dorsalis Pedis: 160] Ankle: Posterior Tibial: [Left:Posterior Tibial: 155] [Right:0.99] Toe Nail Assessment Left: Right: Thick: Yes Discolored: Yes Deformed: No Improper Length and Hygiene: No [name] Cooper, [name] Cooper ([id number]) Electronic Signature(s) Signed: 11/18/2015 3:54:21 PM By: Elpidio EricAfful, Rita BSN, RN Entered By: Elpidio EricAfful,  Rita on 11/18/2015 10:45:03 [name] Cooper, [name] Cooper ([id number]) -------------------------------------------------------------------------------- Multi Wound Chart Details Patient Name: [name] Cooper, [name] Cooper Date of Service: 11/18/2015 10:00 AM Medical Record Number: [id number] Patient Account Number: [id number] Date of Birth/Sex: 02-16-1941 (75 y.o. Female) Treating RN: Clover MealyAfful, RN, BSN, [place] Primary Care Physician: Jerl MinaHedrick, James Other Clinician: Referring Physician: Jerl MinaHedrick, James Treating Physician/Extender: Rudene ReBritto, Errol Weeks in Treatment: 1 Vital Signs Height(in): 67 Pulse(bpm): 62 Weight(lbs): 174 Blood Pressure 161/50 (mmHg): Body Mass Index(BMI): 27 Temperature(F): 97.9 Respiratory Rate 18 (breaths/min): Photos: [1:No Photos] [N/A:N/A] Wound Location: [1:Right Lower Leg - Lateral] [N/A:N/A] Wounding Event: [1:Trauma] [N/A:N/A] Primary Etiology: [1:Dehisced Wound] [N/A:N/A] Comorbid History: [1:Anemia, Arrhythmia,  Hypertension, Osteoarthritis] [N/A:N/A] Date Acquired: [1:10/05/2015] [N/A:N/A] Weeks of Treatment: [1:1] [N/A:N/A] Wound Status: [1:Open] [N/A:N/A] Measurements L x W x D 5.5x3.6x0.2 [N/A:N/A] (cm) Area (cm) : [1:15.551] [N/A:N/A] Volume (cm) : [1:3.11] [N/A:N/A] % Reduction in Area: [1:-32.00%] [N/A:N/A] % Reduction in Volume: 12.00% [N/A:N/A] Classification: [1:Full Thickness Without Exposed Support Structures] [N/A:N/A] Exudate Amount: [1:Large] [N/A:N/A] Exudate Type: [1:Serosanguineous] [N/A:N/A] Exudate Color: [1:red, brown] [N/A:N/A] Wound Margin: [1:Distinct, outline attached] [N/A:N/A] Granulation Amount: [1:Small (1-33%)] [N/A:N/A] Necrotic Amount: [1:Large (67-100%)] [N/A:N/A] Necrotic Tissue: [1:Eschar, Adherent Slough] [N/A:N/A] Exposed Structures: [1:Fascia: No Fat: No Tendon: No Muscle: No] [N/A:N/A] Joint: No Bone: No Limited to Skin Breakdown Epithelialization: None N/A N/A Periwound Skin Texture: Edema: Yes N/A N/A Excoriation:  No Induration: No Callus: No Crepitus: No Fluctuance: No Friable: No Rash: No Scarring: No Periwound Skin Moist: Yes N/A N/A Moisture: Maceration: No Dry/Scaly: No Periwound Skin Color: Atrophie Blanche: No N/A N/A Cyanosis: No Ecchymosis: No Erythema: No Hemosiderin Staining: No Mottled: No Pallor: No Rubor: No Temperature: No Abnormality N/A N/A Tenderness on Yes N/A N/A Palpation: Wound Preparation: Ulcer Cleansing: N/A N/A Rinsed/Irrigated with Saline Topical Anesthetic Applied: Other: lidocaine 4% Treatment Notes Electronic Signature(s) Signed: 11/18/2015 3:54:21 PM By: Elpidio Eric BSN, RN Entered By: Elpidio Eric on 11/18/2015 09:55:59 [name] Cooper ([id number]) -------------------------------------------------------------------------------- Multi-Disciplinary Care Plan Details Patient Name: [name] Cooper Date of Service: 11/18/2015 10:00 AM Medical Record Number: [id number] Patient Account Number: [id number] Date of Birth/Sex: 12-23-1940 (75 y.o. Female) Treating RN: Afful, RN, BSN, [place] Primary Care Physician: [name] Other Clinician: Referring Physician: [name] Treating Physician/Extender: Rudene Re in Treatment: 1 Active Inactive Orientation to the Wound Care Program Nursing Diagnoses: Knowledge deficit related to the wound healing center program Goals: Patient/caregiver will verbalize understanding of the Wound Healing Center Program Date Initiated: 11/11/2015 Goal Status: Active Interventions: Provide education on orientation to the wound center Notes: Wound/Skin Impairment Nursing Diagnoses: Impaired tissue integrity Goals: Patient/caregiver will verbalize understanding of skin care regimen Date Initiated: 11/11/2015 Goal Status: Active Ulcer/skin breakdown will have a volume reduction of 30% by week 4 Date Initiated: 11/11/2015 Goal Status: Active Ulcer/skin breakdown will have a volume reduction of 50% by week  8 Date Initiated: 11/11/2015 Goal Status: Active Ulcer/skin breakdown will have a volume reduction of 80% by week 12 Date Initiated: 11/11/2015 Goal Status: Active Ulcer/skin breakdown will heal within 14 weeks Date Initiated: 11/11/2015 Goal Status: Active MEAGHAN, WHISTLER ([id number]) Interventions: Assess patient/caregiver ability to obtain necessary supplies Assess patient/caregiver ability to perform ulcer/skin care regimen upon admission and as needed Assess ulceration(s) every visit Notes: Electronic Signature(s) Signed: 11/18/2015 3:54:21 PM By: Elpidio Eric BSN, RN Entered By: Elpidio Eric on 11/18/2015 09:55:45 [name] Cooper ([id number]) -------------------------------------------------------------------------------- Pain Assessment Details Patient Name: [name] Cooper Date of Service: 11/18/2015 10:00 AM Medical Record Number: [id number] Patient Account Number: [id number] Date of Birth/Sex: [birth year] (75 y.o. Female) Treating RN: Clover Mealy, RN, BSN, [place] Primary Care Physician: [name] Other Clinician: Referring Physician: [name] Treating Physician/Extender: Rudene Re in Treatment: 1 Active Problems Location of Pain Severity and Description of Pain Patient Has Paino No Site Locations With Dressing Change: No Pain Management and Medication Current Pain Management: Electronic Signature(s) Signed: 11/18/2015 3:54:21 PM By: Elpidio Eric BSN, RN Entered By: Elpidio Eric on 11/18/2015 09:47:32 [name] Cooper ([id number]) -------------------------------------------------------------------------------- Patient/Caregiver Education Details Patient Name: [name] Cooper Date of Service: 11/18/2015 10:00 AM Medical Record Number: [id number] Patient Account Number: [id number] Date of Birth/Gender: [birth year] (75 y.o. Female) Treating RN: Afful, RN, BSN, [facility]  Physician: [name] Other Clinician: Referring Physician: [name] Treating Physician/Extender: Rudene Re in Treatment: 1 Education Assessment Education Provided To: Patient Education Topics Provided Basic Hygiene: Methods: Explain/Verbal Responses: State content correctly Welcome To The Wound Care Center: Methods: Explain/Verbal Responses: State content correctly Wound Debridement: Methods: Explain/Verbal Responses: State content correctly Wound/Skin Impairment: Methods: Explain/Verbal Responses: State content correctly Electronic Signature(s) Signed: 11/18/2015 3:54:21 PM By: Elpidio Eric BSN, RN Entered By: Elpidio Eric on 11/18/2015 10:21:23 [name] Cooper ([id number]) -------------------------------------------------------------------------------- Wound Assessment Details Patient Name: [name] Cooper Date of Service: 11/18/2015 10:00 AM Medical Record Number: [id number] Patient Account Number: [id number] Date of Birth/Sex: 01-19-1941 (75 y.o. Female) Treating RN: Afful, RN, BSN, Rita Primary Care Physician: [name] Other Clinician: Referring Physician: [name] Treating Physician/Extender: Rudene Re in Treatment: 1 Wound Status Wound Number: 1 Primary Dehisced Wound Etiology: Wound Location: Right Lower Leg - Lateral Wound Status: Open Wounding Event: Trauma Comorbid Anemia, Arrhythmia, Hypertension, Date Acquired: 10/05/2015 History: Osteoarthritis Weeks Of Treatment: 1 Clustered Wound: No Photos Photo Uploaded By: Elpidio Eric on 11/18/2015 13:26:22 Wound Measurements Length: (cm) 5.5 Width: (cm) 3.6 Depth: (cm) 0.2 Area: (cm) 15.551 Volume: (cm) 3.11 % Reduction in Area: -32% % Reduction in Volume: 12% Epithelialization: None Tunneling: No Undermining: No Wound Description Full Thickness Without Exposed Classification: Support Structures Wound Margin: Distinct, outline attached Exudate Large Amount: Exudate Type: Serosanguineous Exudate Color: red, brown Foul Odor  After Cleansing: No Wound Bed Granulation Amount: Small (1-33%) Exposed Structure Necrotic Amount: Large (67-100%) Fascia Exposed: No Necrotic Quality: Eschar, Adherent Slough Fat Layer Exposed: No Fultz, Kamarri ([id number]) Tendon Exposed: No Muscle Exposed: No Joint Exposed: No Bone Exposed: No Limited to Skin Breakdown Periwound Skin Texture Texture Color No Abnormalities Noted: No No Abnormalities Noted: No Callus: No Atrophie Blanche: No Crepitus: No Cyanosis: No Excoriation: No Ecchymosis: No Fluctuance: No Erythema: No Friable: No Hemosiderin Staining: No Induration: No Mottled: No Localized Edema: Yes Pallor: No Rash: No Rubor: No Scarring: No Temperature / Pain Moisture Temperature: No Abnormality No Abnormalities Noted: No Tenderness on Palpation: Yes Dry / Scaly: No Maceration: No Moist: Yes Wound Preparation Ulcer Cleansing: Rinsed/Irrigated with Saline Topical Anesthetic Applied: Other: lidocaine 4%, Treatment Notes Wound #1 (Right, Lateral Lower Leg) 1. Cleansed with: Clean wound with Normal Saline 4. Dressing Applied: Santyl Ointment 5. Secondary Dressing Applied Bordered Foam Dressing Dry Gauze 7. Secured with Patient to wear own compression stockings Support Garment 20-30 mm/Hg pressure to: Electronic Signature(s) Signed: 11/18/2015 3:54:21 PM By: Elpidio Eric BSN, RN Entered By: Elpidio Eric on 11/18/2015 09:55:19 [name] Cooper ([id number]) -------------------------------------------------------------------------------- Vitals Details Patient Name: [name] Cooper Date of Service: 11/18/2015 10:00 AM Medical Record Number: [id number] Patient Account Number: [id number] Date of Birth/Sex: [birth year] (75 y.o. Female) Treating RN: Afful, RN, BSN, Rita Primary Care Physician: [name] Other Clinician: Referring Physician: [name] Treating Physician/Extender: Rudene Re in Treatment: 1 Vital Signs Time Taken:  09:47 Temperature (F): 97.9 Height (in): 67 Pulse (bpm): 62 Weight (lbs): 174 Respiratory Rate (breaths/min): 18 Body Mass Index (BMI): 27.2 Blood Pressure (mmHg): 161/50 Reference Range: 80 - 120 mg / dl Electronic Signature(s) Signed: 11/18/2015 3:54:21 PM By: Elpidio Eric BSN, RN Entered By: Elpidio Eric on 11/18/2015 09:47:55

## 2015-11-19 NOTE — Progress Notes (Signed)
KEM, PARCHER ([id number]) Visit Report for 11/18/2015 Chief Complaint Document Details Patient Name: [name] Cooper, [name] Cooper 11/18/2015 10:00 Date of Service: AM Medical Record [id number] Number: Patient Account Number: [id number] 07-Sep-1940 (75 y.o. Treating RN: Clover Mealy, RN, BSN, [place] Sink Date of Birth/Sex: Female) Other Clinician: Primary Care Physician: [name] Treating Evlyn Kanner Referring Physician: [name] Physician/Extender: Tania Ade in Treatment: 1 Information Obtained from: Patient Chief Complaint Patient seen for complaints of Non-Healing Wound to the right lower extremity for about a month Electronic Signature(s) Signed: 11/18/2015 10:27:53 AM By: Evlyn Kanner MD, FACS Entered By: Evlyn Kanner on 11/18/2015 10:27:53 [name] Cooper ([id number]) -------------------------------------------------------------------------------- Debridement Details Patient Name: [name] Cooper, [name] Cooper 11/18/2015 10:00 Date of Service: AM Medical Record [id number] Number: Patient Account Number: [id number] 12-23-1940 (75 y.o. Treating RN: Afful, RN, BSN, [place] Sink Date of Birth/Sex: Female) Other Clinician: Primary Care Physician: [name] Treating Evlyn Kanner Referring Physician: [name] Physician/Extender: Weeks in Treatment: 1 Debridement Performed for Wound #1 Right,Lateral Lower Leg Assessment: Performed By: Physician Evlyn Kanner, MD Debridement: Debridement Pre-procedure Yes - 10:10 Verification/Time Out Taken: Start Time: 10:10 Pain Control: Lidocaine 4% Topical Solution Level: Skin/Subcutaneous Tissue Total Area Debrided (L x 5 (cm) x 3 (cm) = 15 (cm) W): Tissue and other Non-Viable, Eschar, Fibrin/Slough, Subcutaneous material debrided: Instrument: Forceps Bleeding: Minimum Hemostasis Achieved: Pressure End Time: 10:15 Procedural Pain: 0 Post Procedural Pain: 0 Response to Treatment: Procedure was tolerated well Post Debridement Measurements of Total  Wound Length: (cm) 5.5 Width: (cm) 3.6 Depth: (cm) 0.2 Volume: (cm) 3.11 Character of Wound/Ulcer Post Stable Debridement: Severity of Tissue Post Debridement: Fat layer exposed Post Procedure Diagnosis Same as Pre-procedure Electronic Signature(s) Signed: 11/18/2015 10:27:27 AM By: Evlyn Kanner MD, FACS Signed: 11/18/2015 3:54:21 PM By: Elpidio Eric BSN, RN [place], [name] Cooper ([id number]) Entered By: Evlyn Kanner on 11/18/2015 10:27:26 [name] Cooper, [name] Cooper ([id number]) -------------------------------------------------------------------------------- HPI Details Patient Name: [name] Cooper, [name] Cooper 11/18/2015 10:00 Date of Service: AM Medical Record [id number] Number: Patient Account Number: [id number] [birth year] (75 y.o. Treating RN: Clover Mealy, RN, BSN, [place] Sink Date of Birth/Sex: Female) Other Clinician: Primary Care Physician: [name] Treating Evlyn Kanner Referring Physician: [name] Physician/Extender: Weeks in Treatment: 1 History of Present Illness Location: Patient has an traumatic ulcer on her right leg Quality: Patient reports Tenderness to the affected area(s). Severity: Patient states wound are getting worse. Duration: Patient states the wound has been present for about 2 months prior to seeking help from the [facility] Timing: She verbalized constant pain with periods of intermittent uneasiness Context: The wound occurred when she hit her leg against an object Modifying Factors: patient had sutures applied on the initial encounter, was removed and wound dehisced Associated Signs and Symptoms: Patient reports presence of swelling HPI Description: This is a 75 year old African American female who presented today to the wound clinic for further evaluation of a non-healing ulcer which resulted form trauma. She is alert and oriented x 4, pleasant and seemingly good historian. Appears calm and no acute distress noted. Her history includes but not limited to hypertension, Angina  Pectoris, CAD, fibromyalgia. Wears glasses, ambulates with a cane. Wound debrided during this visit, Will use aquacel ag and bordered foam dressing. Will see patient back on a weekly basis. Patient verbalized understanding of instructions. 11/18/2015 -- the patient does not have any major problems except for hypertension, coronary artery disease with cardiac cath done in November 2012 and a normal ejection fraction at 55%, iron deficiency anemia, chronic kidney disease stage II, venous stasis, fibromyalgia, and has had some left ankle surgery (  March this year) and in the remote past(2014) has had venous duplex studies done. was a DVT study and was negative for DVT. she has never been a smoker. Electronic Signature(s) Signed: 11/18/2015 10:36:03 AM By: Evlyn Kanner MD, FACS Entered By: Evlyn Kanner on 11/18/2015 10:36:02 [name] Cooper ([id number]) -------------------------------------------------------------------------------- Physical Exam Details Patient Name: [name] Cooper, [name] Cooper 11/18/2015 10:00 Date of Service: AM Medical Record [id number] Number: Patient Account Number: [id number] 1940/08/23 (75 y.o. Treating RN: Clover Mealy, RN, BSN, [place] Sink Date of Birth/Sex: Female) Other Clinician: Primary Care Physician: Lavena Bullion Referring Physician: [name] Physician/Extender: Weeks in Treatment: 1 Constitutional . Pulse regular. Respirations normal and unlabored. Afebrile. . Eyes Nonicteric. Reactive to light. Ears, Nose, Mouth, and Throat Lips, teeth, and gums WNL.Marland Kitchen Moist mucosa without lesions. Neck supple and nontender. No palpable supraclavicular or cervical adenopathy. Normal sized without goiter. Respiratory WNL. No retractions.. Breath sounds WNL, No rubs, rales, rhonchi, or wheeze.. Cardiovascular Heart rhythm and rate regular, no murmur or gallop.. Pedal Pulses WNL. ABI on the left was 1.13. Sage 1 lymphedema bilaterally. Chest Breasts symmetical  and no nipple discharge.. Breast tissue WNL, no masses, lumps, or tenderness.. Gastrointestinal (GI) Abdomen without masses or tenderness.. No liver or spleen enlargement or tenderness.. Genitourinary (GU) No hydrocele, spermatocele, tenderness of the cord, or testicular mass.Marland Kitchen Penis without lesions.Renetta Chalk without lesions. No cystocele, or rectocele. Pelvic support intact, no discharge.Marland Kitchen Urethra without masses, tenderness or scarring.Marland Kitchen Lymphatic No adneopathy. No adenopathy. No adenopathy. Musculoskeletal Adexa without tenderness or enlargement.. Digits and nails w/o clubbing, cyanosis, infection, petechiae, ischemia, or inflammatory conditions.. Integumentary (Hair, Skin) No suspicious lesions. No crepitus or fluctuance. No peri-wound warmth or erythema. No masses.Marland Kitchen Psychiatric Judgement and insight Intact.. No evidence of depression, anxiety, or agitation.[name] Cooper, [name] Cooper ([id number]) wound on the right lower extremity laterally has some subcutaneous debris and some subcutaneous Vicryl sutures still intact. I removed as much of eschar and subcutis debris as possible with tooth forcep and a moist [name] Cooper gauze. Electronic Signature(s) Signed: 11/18/2015 10:38:03 AM By: Evlyn Kanner MD, FACS Entered By: Evlyn Kanner on 11/18/2015 10:38:02 [name] Cooper ([id number]) -------------------------------------------------------------------------------- Physician Orders Details Patient Name: [name] Cooper, [name] Cooper 11/18/2015 10:00 Date of Service: AM Medical Record [id number] Number: Patient Account Number: [id number] 1941-03-22 (75 y.o. Treating RN: Clover Mealy, RN, BSN, [place] Sink Date of Birth/Sex: Female) Other Clinician: Primary Care Physician: [name] Treating Evlyn Kanner Referring Physician: [name] Physician/Extender: Tania Ade in Treatment: 1 Verbal / Phone Orders: Yes Clinician: Afful, RN, BSN, Rita Read Back and Verified: Yes Diagnosis Coding Wound Cleansing Wound  #1 Right,Lateral Lower Leg o Clean wound with Normal [name] Cooper. Anesthetic Wound #1 Right,Lateral Lower Leg o Topical Lidocaine 4% cream applied to wound bed prior to debridement Skin Barriers/Peri-Wound Care Wound #1 Right,Lateral Lower Leg o Skin Prep Primary Wound Dressing Wound #1 Right,Lateral Lower Leg o Santyl Ointment o Medihoney gel - Use this if not able to afford Santyl. Check with any retail pharmacy store for [facility] Secondary Dressing Wound #1 Right,Lateral Lower Leg o Boardered Foam Dressing Dressing Change Frequency Wound #1 Right,Lateral Lower Leg o Change dressing every other day. Follow-up Appointments Wound #1 Right,Lateral Lower Leg o Return Appointment in 1 week. Edema Control Wound #1 Right,Lateral Lower Leg o Elevate legs to the level of the heart and pump ankles as often as possible Julian, Chantele ([id number]) Additional Orders / Instructions Wound #1 Right,Lateral Lower Leg o Increase protein intake. o Activity as tolerated Patient Medications Allergies: sulfur Notifications Medication Indication Start End Santyl 11/18/2015  DOSE topical 250 unit/gram ointment - ointment topical as directed Electronic Signature(s) Signed: 11/18/2015 10:49:08 AM By: Evlyn Kanner MD, FACS Entered By: Evlyn Kanner on 11/18/2015 10:49:07 [name] Cooper ([id number]) -------------------------------------------------------------------------------- Problem List Details Patient Name: LAYANA, KONKEL 11/18/2015 10:00 Date of Service: AM Medical Record [id number] Number: Patient Account Number: [id number] [birth year] (75 y.o. Treating RN: Clover Mealy, RN, BSN, [place] Sink Date of Birth/Sex: Female) Other Clinician: Primary Care Physician: [name] Treating Evlyn Kanner Referring Physician: [name] Physician/Extender: Tania Ade in Treatment: 1 Active Problems ICD-10 Encounter Code Description Active Date Diagnosis S81.811A Laceration without  foreign body, right lower leg, initial 11/18/2015 Yes encounter I89.0 Lymphedema, not elsewhere classified 11/18/2015 Yes Inactive Problems Resolved Problems Electronic Signature(s) Signed: 11/18/2015 10:26:59 AM By: Evlyn Kanner MD, FACS Entered By: Evlyn Kanner on 11/18/2015 10:26:59 [name] Cooper ([id number]) -------------------------------------------------------------------------------- Progress Note Details Patient Name: [name] Cooper, [name] Cooper 11/18/2015 10:00 Date of Service: AM Medical Record [id number] Number: Patient Account Number: [id number] 12-Apr-1940 (75 y.o. Treating RN: Clover Mealy, RN, BSN, [place] Sink Date of Birth/Sex: Female) Other Clinician: Primary Care Physician: [name] Treating Evlyn Kanner Referring Physician: [name] Physician/Extender: Weeks in Treatment: 1 Subjective Chief Complaint Information obtained from Patient Patient seen for complaints of Non-Healing Wound to the right lower extremity for about a month History of Present Illness (HPI) The following HPI elements were documented for the patient's wound: Location: Patient has an traumatic ulcer on her right leg Quality: Patient reports Tenderness to the affected area(s). Severity: Patient states wound are getting worse. Duration: Patient states the wound has been present for about 2 months prior to seeking help from the [facility] Timing: She verbalized constant pain with periods of intermittent uneasiness Context: The wound occurred when she hit her leg against an object Modifying Factors: patient had sutures applied on the initial encounter, was removed and wound dehisced Associated Signs and Symptoms: Patient reports presence of swelling This is a 75 year old African American female who presented today to the wound clinic for further evaluation of a non-healing ulcer which resulted form trauma. She is alert and oriented x 4, pleasant and seemingly good historian. Appears calm and no acute distress  noted. Her history includes but not limited to hypertension, Angina Pectoris, CAD, fibromyalgia. Wears glasses, ambulates with a cane. Wound debrided during this visit, Will use aquacel ag and bordered foam dressing. Will see patient back on a weekly basis. Patient verbalized understanding of instructions. 11/18/2015 -- the patient does not have any major problems except for hypertension, coronary artery disease with cardiac cath done in November 2012 and a normal ejection fraction at 55%, iron deficiency anemia, chronic kidney disease stage II, venous stasis, fibromyalgia, and has had some left ankle surgery ( March this year) and in the remote past(2014) has had venous duplex studies done. was a DVT study and was negative for DVT. she has never been a smoker. Medications aspirin unspecified unspecified unspecified once daily losartan 100 mg-hydrochlorothiazide 25 mg tablet oral 1 1 tablet oral daily [place], Faron ([id number]) Ambien 10 mg tablet oral 1 1 tablet oral nuightly Objective Constitutional Pulse regular. Respirations normal and unlabored. Afebrile. Vitals Time Taken: 9:47 AM, Height: 67 in, Weight: 174 lbs, BMI: 27.2, Temperature: 97.9 F, Pulse: 62 bpm, Respiratory Rate: 18 breaths/min, Blood Pressure: 161/50 mmHg. Eyes Nonicteric. Reactive to light. Ears, Nose, Mouth, and Throat Lips, teeth, and gums WNL.Marland Kitchen Moist mucosa without lesions. Neck supple and nontender. No palpable supraclavicular or cervical adenopathy. Normal sized without goiter. Respiratory WNL. No retractions.. Breath sounds WNL, No rubs,  rales, rhonchi, or wheeze.. Cardiovascular Heart rhythm and rate regular, no murmur or gallop.. Pedal Pulses WNL. ABI on the left was 1.13. Sage 1 lymphedema bilaterally. Chest Breasts symmetical and no nipple discharge.. Breast tissue WNL, no masses, lumps, or tenderness.. Gastrointestinal (GI) Abdomen without masses or tenderness.. No liver or spleen enlargement or  tenderness.. Genitourinary (GU) No hydrocele, spermatocele, tenderness of the cord, or testicular mass.Marland Kitchen Penis without lesions.Renetta Chalk without lesions. No cystocele, or rectocele. Pelvic support intact, no discharge.Marland Kitchen Urethra without masses, tenderness or scarring.Marland Kitchen Lymphatic No adneopathy. No adenopathy. No adenopathy. Musculoskeletal Adexa without tenderness or enlargement.. Digits and nails w/o clubbing, cyanosis, infection, petechiae, ischemia, or inflammatory conditions.Marland Kitchen Psychiatric [name] Cooper, [name] Cooper ([id number]) Judgement and insight Intact.. No evidence of depression, anxiety, or agitation.. General Notes: wound on the right lower extremity laterally has some subcutaneous debris and some subcutaneous Vicryl sutures still intact. I removed as much of eschar and subcutis debris as possible with tooth forcep and a moist [name] Cooper gauze. Integumentary (Hair, Skin) No suspicious lesions. No crepitus or fluctuance. No peri-wound warmth or erythema. No masses.. Wound #1 status is Open. Original cause of wound was Trauma. The wound is located on the Right,Lateral Lower Leg. The wound measures 5.5cm length x 3.6cm width x 0.2cm depth; 15.551cm^2 area and 3.11cm^3 volume. The wound is limited to skin breakdown. There is no tunneling or undermining noted. There is a large amount of serosanguineous drainage noted. The wound margin is distinct with the outline attached to the wound base. There is small (1-33%) granulation within the wound bed. There is a large (67- 100%) amount of necrotic tissue within the wound bed including Eschar and Adherent Slough. The periwound skin appearance exhibited: Localized Edema, Moist. The periwound skin appearance did not exhibit: Callus, Crepitus, Excoriation, Fluctuance, Friable, Induration, Rash, Scarring, Dry/Scaly, Maceration, Atrophie Blanche, Cyanosis, Ecchymosis, Hemosiderin Staining, Mottled, Pallor, Rubor, Erythema. Periwound temperature was noted as  No Abnormality. The periwound has tenderness on palpation. Assessment Active Problems ICD-10 W09.811B - Laceration without foreign body, right lower leg, initial encounter I89.0 - Lymphedema, not elsewhere classified after review of wounds I have recommended: 1. Santyll ointment to be applied daily with a bordered foam and then 2. Compression stockings of the 20-30 mm variety and I have discussed details of how this should be worn. 3. Regular visits to the wound center Procedures Wound #1 Wound #1 is a Dehisced Wound located on the Right,Lateral Lower Leg . There was a Skin/Subcutaneous Tissue Debridement ([id number]) debridement with total area of 15 sq cm performed by Evlyn Kanner, MD. [place], [name] Cooper ([id number]) with the following instrument(s): Forceps to remove Non-Viable tissue/material including Fibrin/Slough, Eschar, and Subcutaneous after achieving pain control using Lidocaine 4% Topical Solution. A time out was conducted at 10:10, prior to the start of the procedure. A Minimum amount of bleeding was controlled with Pressure. The procedure was tolerated well with a pain level of 0 throughout and a pain level of 0 following the procedure. Post Debridement Measurements: 5.5cm length x 3.6cm width x 0.2cm depth; 3.11cm^3 volume. Character of Wound/Ulcer Post Debridement is stable. Severity of Tissue Post Debridement is: Fat layer exposed. Post procedure Diagnosis Wound #1: Same as Pre-Procedure Plan Wound Cleansing: Wound #1 Right,Lateral Lower Leg: Clean wound with Normal [name] Cooper. Anesthetic: Wound #1 Right,Lateral Lower Leg: Topical Lidocaine 4% cream applied to wound bed prior to debridement Skin Barriers/Peri-Wound Care: Wound #1 Right,Lateral Lower Leg: Skin Prep Primary Wound Dressing: Wound #1 Right,Lateral Lower Leg: Santyl Ointment Medihoney gel - Use this  if not able to afford Santyl. [facility] Secondary  Dressing: Wound #1 Right,Lateral Lower Leg: Boardered Foam Dressing Dressing Change Frequency: Wound #1 Right,Lateral Lower Leg: Change dressing every other day. Follow-up Appointments: Wound #1 Right,Lateral Lower Leg: Return Appointment in 1 week. Edema Control: Wound #1 Right,Lateral Lower Leg: Elevate legs to the level of the heart and pump ankles as often as possible Additional Orders / Instructions: Wound #1 Right,Lateral Lower Leg: Increase protein intake. Activity as tolerated The following medication(s) was prescribed: Santyl topical 250 unit/gram ointment ointment topical as directed starting 11/18/2015 [name] LittenBURRELL, Jeilyn ([id number]) after review of wounds I have recommended: 1. Santyl ointment to be applied daily with a bordered foam and then 2. Compression stockings of the 20-30 mm variety and I have discussed details of how this should be worn. 3. Regular visits to the wound center 4. if the Santyl ointment is to expensive she should use S[employer]itor[employer]oney Electronic Signature(s) Signed: 11/18/2015 10:49:40 AM By: Evlyn KannerBritto, Terril Chestnut MD, FACS Previous Signature: 11/18/2015 10:40:03 AM Version By: Evlyn KannerBritto, Priscila Bean MD, FACS Entered By: Evlyn KannerBritto, Elynn Patteson on 11/18/2015 10:49:40 [name] LittenBURRELL, Antavia ([id number]) -------------------------------------------------------------------------------- SuperBill Details Patient Name: [name] LittenBURRELL, Shirlena Date of Service: 11/18/2015 Medical Record Patient Account Number: [id number] Number: Afful, RN, BSN, Treating RN: [birth year] (75 y.o. [place] Date of Birth/Sex: Female) Other Clinician: Primary Care Physician: Jerl MinaHedrick, James Treating Evlyn KannerBritto, Catherine Cubero Referring Physician: Jerl MinaHedrick, James Physician/Extender: Tania AdeWeeks in Treatment: 1 Diagnosis Coding ICD-10 Codes Code Description [phone] Laceration without foreign body, right lower leg, initial encounter I89.0 Lymphedema, not elsewhere classified Facility Procedures CPT4 Code Description: [id number] 11042 -  DEB SUBQ TISSUE 20 SQ CM/< ICD-10 Description Diagnosis S81.811A Laceration without foreign body, right lower leg, init I89.0 Lymphedema, not elsewhere classified Modifier: ial encounter Quantity: 1 Physician Procedures CPT4 Code Description: [id number] 11042 - WC PHYS SUBQ TISS 20 SQ CM ICD-10 Description Diagnosis S81.811A Laceration without foreign body, right lower leg, init I89.0 Lymphedema, not elsewhere classified Modifier: ial encounter Quantity: 1 Electronic Signature(s) Signed: 11/18/2015 10:40:11 AM By: Evlyn KannerBritto, Keya[place] Graefe MD, FACS Entered By: Evlyn KannerBritto, Cleatis Fandrich on 11/18/2015 10:40:11

## 2015-11-25 ENCOUNTER — Encounter: Payer: Medicare Other | Attending: Surgery | Admitting: Surgery

## 2015-11-25 DIAGNOSIS — M1990 Unspecified osteoarthritis, unspecified site: Secondary | ICD-10-CM | POA: Diagnosis not present

## 2015-11-25 DIAGNOSIS — I890 Lymphedema, not elsewhere classified: Secondary | ICD-10-CM | POA: Insufficient documentation

## 2015-11-25 DIAGNOSIS — D649 Anemia, unspecified: Secondary | ICD-10-CM | POA: Diagnosis not present

## 2015-11-25 DIAGNOSIS — X58XXXA Exposure to other specified factors, initial encounter: Secondary | ICD-10-CM | POA: Diagnosis not present

## 2015-11-25 DIAGNOSIS — I10 Essential (primary) hypertension: Secondary | ICD-10-CM | POA: Insufficient documentation

## 2015-11-25 DIAGNOSIS — S81811A Laceration without foreign body, right lower leg, initial encounter: Secondary | ICD-10-CM | POA: Diagnosis not present

## 2015-11-26 NOTE — Progress Notes (Signed)
GRAINNE, KNIGHTS ([id number]) Visit Report for 11/25/2015 Chief Complaint Document Details Patient Name: [name] Cooper, [name] Cooper Date of Service: 11/25/2015 10:45 AM Medical Record Patient Account Number: [id number] Number: Afful, RN, BSN, Treating RN: 1941-02-22 (75 y.o. [place] Sink Date of Birth/Sex: Female) Other Clinician: Primary Care Physician: [name] Treating Evlyn Kanner Referring Physician: [name] Physician/Extender: Tania Ade in Treatment: 2 Information Obtained from: Patient Chief Complaint Patient seen for complaints of Non-Healing Wound to the right lower extremity for about a month Electronic Signature(s) Signed: 11/25/2015 10:58:31 AM By: Evlyn Kanner MD, FACS Entered By: Evlyn Kanner on 11/25/2015 10:58:30 [name] Cooper ([id number]) -------------------------------------------------------------------------------- Debridement Details Patient Name: [name] Cooper Date of Service: 11/25/2015 10:45 AM Medical Record Patient Account Number: [id number] Number: Afful, RN, BSN, Treating RN: [birth year] (75 y.o. [place] Sink Date of Birth/Sex: Female) Other Clinician: Primary Care Physician: [name] Treating Evlyn Kanner Referring Physician: [name] Physician/Extender: Tania Ade in Treatment: 2 Debridement Performed for Wound #1 Right,Lateral Lower Leg Assessment: Performed By: Physician Evlyn Kanner, MD Debridement: Debridement Pre-procedure Yes - 10:50 Verification/Time Out Taken: Start Time: 10:51 Pain Control: Lidocaine 4% Topical Solution Level: Skin/Subcutaneous Tissue Total Area Debrided (L x 5 (cm) x 2 (cm) = 10 (cm) W): Tissue and other Viable, Non-Viable, Eschar, Fibrin/Slough, Subcutaneous material debrided: Instrument: Curette Bleeding: Minimum Hemostasis Achieved: Pressure End Time: 10:53 Procedural Pain: 0 Post Procedural Pain: 0 Response to Treatment: Procedure was tolerated well Post Debridement Measurements of Total  Wound Length: (cm) 5 Width: (cm) 2 Depth: (cm) 0.2 Volume: (cm) 1.571 Character of Wound/Ulcer Post Stable Debridement: Severity of Tissue Post Debridement: Fat layer exposed Post Procedure Diagnosis Same as Pre-procedure Electronic Signature(s) Signed: 11/25/2015 10:58:23 AM By: Evlyn Kanner MD, FACS Signed: 11/25/2015 3:26:34 PM By: Elpidio Eric BSN, RN [place], Cornelius ([id number]) Entered By: Evlyn Kanner on 11/25/2015 10:58:23 [name] Cooper ([id number]) -------------------------------------------------------------------------------- HPI Details Patient Name: [name] Cooper Date of Service: 11/25/2015 10:45 AM Medical Record Patient Account Number: [id number] Number: Afful, RN, BSN, Treating RN: [birth year] (75 y.o. [place] Sink Date of Birth/Sex: Female) Other Clinician: Primary Care Physician: [name] Treating Evlyn Kanner Referring Physician: [name] Physician/Extender: Tania Ade in Treatment: 2 History of Present Illness Location: Patient has an traumatic ulcer on her right leg Quality: Patient reports Tenderness to the affected area(s). Severity: Patient states wound are getting worse. Duration: Patient states the wound has been present for about 2 months prior to seeking help from the [facility] Timing: She verbalized constant pain with periods of intermittent uneasiness Context: The wound occurred when she hit her leg against an object Modifying Factors: patient had sutures applied on the initial encounter, was removed and wound dehisced Associated Signs and Symptoms: Patient reports presence of swelling HPI Description: This is a 75 year old African American female who presented today to the wound clinic for further evaluation of a non-healing ulcer which resulted form trauma. She is alert and oriented x 4, pleasant and seemingly good historian. Appears calm and no acute distress noted. Her history includes but not limited to hypertension, Angina Pectoris,  CAD, fibromyalgia. Wears glasses, ambulates with a cane. Wound debrided during this visit, Will use aquacel ag and bordered foam dressing. Will see patient back on a weekly basis. Patient verbalized understanding of instructions. 11/18/2015 -- the patient does not have any major problems except for hypertension, coronary artery disease with cardiac cath done in November 2012 and a normal ejection fraction at 55%, iron deficiency anemia, chronic kidney disease stage II, venous stasis, fibromyalgia, and has had some left ankle  surgery ( March this year) and in the remote past(2014) has had venous duplex studies done. was a DVT study and was negative for DVT. she has never been a smoker. Electronic Signature(s) Signed: 11/25/2015 10:58:36 AM By: Evlyn Kanner MD, FACS Entered By: Evlyn Kanner on 11/25/2015 10:58:36 [name] Cooper ([id number]) -------------------------------------------------------------------------------- Physical Exam Details Patient Name: [name] Cooper Date of Service: 11/25/2015 10:45 AM Medical Record Patient Account Number: [id number] Number: Afful, RN, BSN, Treating RN: 07-05-1940 (75 y.o. [place] Sink Date of Birth/Sex: Female) Other Clinician: Primary Care Physician: [name] Treating Evlyn Kanner Referring Physician: [name] Physician/Extender: Tania Ade in Treatment: 2 Constitutional . Pulse regular. Respirations normal and unlabored. Afebrile. . Eyes Nonicteric. Reactive to light. Ears, Nose, Mouth, and Throat Lips, teeth, and gums WNL.Marland Kitchen Moist mucosa without lesions. Neck supple and nontender. No palpable supraclavicular or cervical adenopathy. Normal sized without goiter. Respiratory WNL. No retractions.. Cardiovascular Pedal Pulses WNL. No clubbing, cyanosis or edema. Lymphatic No adneopathy. No adenopathy. No adenopathy. Musculoskeletal Adexa without tenderness or enlargement.. Digits and nails w/o clubbing, cyanosis, infection,  petechiae, ischemia, or inflammatory conditions.. Integumentary (Hair, Skin) No suspicious lesions. No crepitus or fluctuance. No peri-wound warmth or erythema. No masses.Marland Kitchen Psychiatric Judgement and insight Intact.. No evidence of depression, anxiety, or agitation.. Notes the wound continues to have quite a significant amount of subcutaneous debris which I sharply removed with a #3 curet and minimal bleeding controlled with pressure. Electronic Signature(s) Signed: 11/25/2015 10:59:12 AM By: Evlyn Kanner MD, FACS Entered By: Evlyn Kanner on 11/25/2015 10:59:11 [name] Cooper ([id number]) -------------------------------------------------------------------------------- Physician Orders Details Patient Name: [name] Cooper Date of Service: 11/25/2015 10:45 AM Medical Record Number: [id number] Patient Account Number: [id number] Date of Birth/Sex: Apr 12, 1940 (75 y.o. Female) Treating RN: Curtis Sites Primary Care Physician: [name] Other Clinician: Referring Physician: [name] Treating Physician/Extender: Rudene Re in Treatment: 2 Verbal / Phone Orders: Yes Clinician: Curtis Sites Read Back and Verified: Yes Diagnosis Coding Wound Cleansing Wound #1 Right,Lateral Lower Leg o Clean wound with Normal Saline. Anesthetic Wound #1 Right,Lateral Lower Leg o Topical Lidocaine 4% cream applied to wound bed prior to debridement Skin Barriers/Peri-Wound Care Wound #1 Right,Lateral Lower Leg o Skin Prep Primary Wound Dressing Wound #1 Right,Lateral Lower Leg o Santyl Ointment - in wound clinic o Medihoney gel - patient using medihoney Secondary Dressing Wound #1 Right,Lateral Lower Leg o Boardered Foam Dressing Dressing Change Frequency Wound #1 Right,Lateral Lower Leg o Change dressing every other day. Follow-up Appointments Wound #1 Right,Lateral Lower Leg o Return Appointment in 1 week. Edema Control Wound #1 Right,Lateral Lower  Leg o Elevate legs to the level of the heart and pump ankles as often as possible o Support Garment 20-30 mm/Hg pressure to: Home, Eather Colas ([id number]) Additional Orders / Instructions Wound #1 Right,Lateral Lower Leg o Increase protein intake. o Activity as tolerated Electronic Signature(s) Signed: 11/25/2015 3:52:23 PM By: Evlyn Kanner MD, FACS Signed: 11/25/2015 4:27:47 PM By: Curtis Sites Entered By: Curtis Sites on 11/25/2015 10:55:06 [name] Cooper ([id number]) -------------------------------------------------------------------------------- Problem List Details Patient Name: [name] Cooper Date of Service: 11/25/2015 10:45 AM Medical Record Number: [id number] Patient Account Number: [id number] Date of Birth/Sex: 1940/09/04 (75 y.o. Female) Treating RN: Primary Care Physician: [name] Other Clinician: Referring Physician: [name] Treating Physician/Extender: Tania Ade in Treatment: 2 Active Problems Inactive Problems Resolved Problems Electronic Signature(s) Signed: 11/25/2015 10:58:06 AM By: Evlyn Kanner MD, FACS Entered By: Evlyn Kanner on 11/25/2015 10:58:05 [name] Cooper ([id number]) -------------------------------------------------------------------------------- Progress Note Details Patient Name: [name] Cooper Date of Service: 11/25/2015  10:45 AM Medical Record Patient Account Number: [id number] Number: Afful, RN, BSN, Treating RN: Aug 17, 1940 (75 y.o. [place] Sink Date of Birth/Sex: Female) Other Clinician: Primary Care Physician: [name] Treating Evlyn Kanner Referring Physician: [name] Physician/Extender: Tania Ade in Treatment: 2 Subjective Chief Complaint Information obtained from Patient Patient seen for complaints of Non-Healing Wound to the right lower extremity for about a month History of Present Illness (HPI) The following HPI elements were documented for the patient's wound: Location: Patient has an  traumatic ulcer on her right leg Quality: Patient reports Tenderness to the affected area(s). Severity: Patient states wound are getting worse. Duration: Patient states the wound has been present for about 2 months prior to seeking help from the [facility] Timing: She verbalized constant pain with periods of intermittent uneasiness Context: The wound occurred when she hit her leg against an object Modifying Factors: patient had sutures applied on the initial encounter, was removed and wound dehisced Associated Signs and Symptoms: Patient reports presence of swelling This is a 75 year old African American female who presented today to the wound clinic for further evaluation of a non-healing ulcer which resulted form trauma. She is alert and oriented x 4, pleasant and seemingly good historian. Appears calm and no acute distress noted. Her history includes but not limited to hypertension, Angina Pectoris, CAD, fibromyalgia. Wears glasses, ambulates with a cane. Wound debrided during this visit, Will use aquacel ag and bordered foam dressing. Will see patient back on a weekly basis. Patient verbalized understanding of instructions. 11/18/2015 -- the patient does not have any major problems except for hypertension, coronary artery disease with cardiac cath done in November 2012 and a normal ejection fraction at 55%, iron deficiency anemia, chronic kidney disease stage II, venous stasis, fibromyalgia, and has had some left ankle surgery ( March this year) and in the remote past(2014) has had venous duplex studies done. was a DVT study and was negative for DVT. she has never been a smoker. Objective [name] Cooper, [name] Cooper ([id number]) Constitutional Pulse regular. Respirations normal and unlabored. Afebrile. Vitals Time Taken: 10:38 AM, Height: 67 in, Weight: 174 lbs, BMI: 27.2, Pulse: 61 bpm, Respiratory Rate: 18 breaths/min, Blood Pressure: 152/52 mmHg. Eyes Nonicteric. Reactive to light. Ears, Nose, Mouth,  and Throat Lips, teeth, and gums WNL.Marland Kitchen Moist mucosa without lesions. Neck supple and nontender. No palpable supraclavicular or cervical adenopathy. Normal sized without goiter. Respiratory WNL. No retractions.. Cardiovascular Pedal Pulses WNL. No clubbing, cyanosis or edema. Lymphatic No adneopathy. No adenopathy. No adenopathy. Musculoskeletal Adexa without tenderness or enlargement.. Digits and nails w/o clubbing, cyanosis, infection, petechiae, ischemia, or inflammatory conditions.Marland Kitchen Psychiatric Judgement and insight Intact.. No evidence of depression, anxiety, or agitation.. General Notes: the wound continues to have quite a significant amount of subcutaneous debris which I sharply removed with a #3 curet and minimal bleeding controlled with pressure. Integumentary (Hair, Skin) No suspicious lesions. No crepitus or fluctuance. No peri-wound warmth or erythema. No masses.. Wound #1 status is Open. Original cause of wound was Trauma. The wound is located on the Right,Lateral Lower Leg. The wound measures 5cm length x 2cm width x 0.2cm depth; 7.854cm^2 area and 1.571cm^3 volume. The wound is limited to skin breakdown. There is no tunneling or undermining noted. There is a large amount of serosanguineous drainage noted. The wound margin is distinct with the outline attached to the wound base. There is small (1-33%) pink, pale granulation within the wound bed. There is a large (67- 100%) amount of necrotic tissue within the wound  bed including Eschar and Adherent Slough. The periwound skin appearance exhibited: Localized Edema, Moist. The periwound skin appearance did not exhibit: Callus, Crepitus, Excoriation, Fluctuance, Friable, Induration, Rash, Scarring, Dry/Scaly, Maceration, Atrophie Blanche, Cyanosis, Ecchymosis, Hemosiderin Staining, Mottled, Pallor, Rubor, Erythema. Periwound temperature was noted as No Abnormality. The periwound has tenderness on [name] Cooper, [name] Cooper  ([id number]) palpation. Assessment Procedures Wound #1 Wound #1 is a Dehisced Wound located on the Right,Lateral Lower Leg . There was a Skin/Subcutaneous Tissue Debridement ([id number]([id number]) debridement with total area of 10 sq cm performed by Evlyn KannerBritto, Graysin Luczynski, MD. with the following instrument(s): Curette to remove Viable and Non-Viable tissue/material including Fibrin/Slough, Eschar, and Subcutaneous after achieving pain control using Lidocaine 4% Topical Solution. A time out was conducted at 10:50, prior to the start of the procedure. A Minimum amount of bleeding was controlled with Pressure. The procedure was tolerated well with a pain level of 0 throughout and a pain level of 0 following the procedure. Post Debridement Measurements: 5cm length x 2cm width x 0.2cm depth; 1.571cm^3 volume. Character of Wound/Ulcer Post Debridement is stable. Severity of Tissue Post Debridement is: Fat layer exposed. Post procedure Diagnosis Wound #1: Same as Pre-Procedure Plan Wound Cleansing: Wound #1 Right,Lateral Lower Leg: Clean wound with Normal Saline. Anesthetic: Wound #1 Right,Lateral Lower Leg: Topical Lidocaine 4% cream applied to wound bed prior to debridement Skin Barriers/Peri-Wound Care: Wound #1 Right,Lateral Lower Leg: Skin Prep Primary Wound Dressing: Wound #1 Right,Lateral Lower Leg: Santyl Ointment - in wound clinic Medihoney gel - patient using [name] Cooper, [name] Cooper ([id number]) Secondary Dressing: Wound #1 Right,Lateral Lower Leg: Boardered Foam Dressing Dressing Change Frequency: Wound #1 Right,Lateral Lower Leg: Change dressing every other day. Follow-up Appointments: Wound #1 Right,Lateral Lower Leg: Return Appointment in 1 week. Edema Control: Wound #1 Right,Lateral Lower Leg: Elevate legs to the level of the heart and pump ankles as often as possible Support Garment 20-30 mm/Hg pressure to: Additional Orders / Instructions: Wound #1 Right,Lateral Lower  Leg: Increase protein intake. Activity as tolerated after review of wounds I have recommended: 1. Medihoney ointment to be applied daily with a bordered foam and then 2. Compression stockings of the 20-30 mm variety and I have discussed details of how this should be worn. 3. Regular visits to the wound center Electronic Signature(s) Signed: 11/25/2015 11:00:12 AM By: Evlyn KannerBritto, Agata Lucente MD, FACS Entered By: Evlyn KannerBritto, Lutricia Widjaja on 11/25/2015 11:00:12 [name] Cooper, [name] Cooper ([id number]) -------------------------------------------------------------------------------- SuperBill Details Patient Name: [name] Cooper, [name] Cooper Date of Service: 11/25/2015 Medical Record Patient Account Number: [id number] Number: Afful, RN, BSN, Treating RN: January 29, 1941 (75 y.o. [place] S[place] Date of Birth/Sex: Female) Other Clinician: Primary Care Physician: Jerl MinaHedrick, James Treating Evlyn KannerBritto, Ellwood Steidle Referring Physician: Jerl MinaHedrick, James Physician/Extender: Tania AdeWeeks in Treatment: 2 Diagnosis Coding ICD-10 Codes Code Description [phone] Laceration without foreign body, right lower leg, initial encounter I89.0 Lymphedema, not elsewhere classified Facility Procedures CPT4 Code Description: [id number] 11042 - DEB SUBQ TISSUE 20 SQ CM/< ICD-10 Description Diagnosis S81.811A Laceration without foreign body, right lower leg, init I89.0 Lymphedema, not elsewhere classified Modifier: ial encounter Quantity: 1 Physician Procedures CPT4 Code Description: [id number] 11042 - WC PHYS SUBQ TISS 20 SQ CM ICD-10 Description Diagnosis S81.811A Laceration without foreign body, right lower leg, init I89.0 Lymphedema, not elsewhere classified Modifier: ial encounter Quantity: 1 Electronic Signature(s) Signed: 11/25/2015 11:00:24 AM By: Evlyn KannerBritto, Blaike Vickers MD, FACS Entered By: Evlyn KannerBritto, Sahily Biddle on 11/25/2015 11:00:24

## 2015-11-29 NOTE — Progress Notes (Signed)
[name], Cooper ([id number]) Visit Report for 11/25/2015 Arrival Information Details Patient Name: [name] Cooper, [name] Cooper Date of Service: 11/25/2015 10:45 AM Medical Record Number: [id number] Patient Account Number: [id number] Date of Birth/Sex: 01/05/1941 (75 y.o. Female) Treating RN: Afful, RN, BSN, Trinity Sink Primary Care Physician: [name] Other Clinician: Referring Physician: [name] Treating Physician/Extender: Rudene Re in Treatment: 2 Visit Information History Since Last Visit All ordered tests and consults were completed: No Patient Arrived: Ambulatory Added or deleted any medications: No Arrival Time: 10:34 Any new allergies or adverse reactions: No Accompanied By: friend Had a fall or experienced change in No Transfer Assistance: None activities of daily living that may affect Patient Identification Verified: Yes risk of falls: Secondary Verification Process Yes Signs or symptoms of abuse/neglect since last No Completed: visito Patient Requires Transmission-Based No Hospitalized since last visit: No Precautions: Has Dressing in Place as Prescribed: Yes Patient Has Alerts: Yes Has Compression in Place as Prescribed: Yes Patient Alerts: Left ABI Pain Present Now: Yes 1.13 Electronic Signature(s) Signed: 11/25/2015 3:26:34 PM By: Elpidio Eric BSN, RN Entered By: Elpidio Eric on 11/25/2015 10:35:15 [name] Cooper ([id number]) -------------------------------------------------------------------------------- Encounter Discharge Information Details Patient Name: [name] Cooper Date of Service: 11/25/2015 10:45 AM Medical Record Number: [id number] Patient Account Number: [id number] Date of Birth/Sex: Nov 12, 1940 (75 y.o. Female) Treating RN: Clover Mealy, RN, BSN, [place]elso Sink Primary Care Physician: [name] Other Clinician: Referring Physician: [name] Treating Physician/Extender: Rudene Re in Treatment: 2 Encounter Discharge Information  Items Discharge Pain Level: 0 Discharge Condition: Stable Ambulatory Status: Cane Discharge Destination: Home Transportation: Private Auto Accompanied By: Clearence Cheek Schedule Follow-up Appointment: No Medication Reconciliation completed No and provided to Patient/Care Shayle Donahoo: Provided on Clinical Summary of Care: 11/25/2015 Form Type Recipient Paper Patient DB Electronic Signature(s) Signed: 11/29/2015 4:23:09 PM By: Elpidio Eric BSN, RN Previous Signature: 11/25/2015 11:01:00 AM Version By: Gwenlyn Perking Entered By: Elpidio Eric on 11/25/2015 15:37:58 [name] Cooper ([id number]) -------------------------------------------------------------------------------- Lower Extremity Assessment Details Patient Name: [name] Cooper Date of Service: 11/25/2015 10:45 AM Medical Record Number: [id number] Patient Account Number: [id number] Date of Birth/Sex: 06-Nov-1940 (75 y.o. Female) Treating RN: Afful, RN, BSN, [place] Sink Primary Care Physician: [name] Other Clinician: Referring Physician: [name] Treating Physician/Extender: Rudene Re in Treatment: 2 Edema Assessment Assessed: Kyra Searles: No] [Right: No] E[Left: dema] [Right: :] Calf Left: Right: Point of Measurement: 35 cm From Medial Instep cm 37.2 cm Ankle Left: Right: Point of Measurement: 9 cm From Medial Instep cm 24.2 cm Vascular Assessment Claudication: Claudication Assessment [Right:None] Pulses: Posterior Tibial Dorsalis Pedis Palpable: [Right:Yes] Extremity colors, hair growth, and conditions: Extremity Color: [Right:Normal] Hair Growth on Extremity: [Right:No] Temperature of Extremity: [Right:Warm] Capillary Refill: [Right:< 3 seconds] Toe Nail Assessment Left: Right: Thick: No Discolored: No Deformed: No Improper Length and Hygiene: No Electronic Signature(s) Signed: 11/25/2015 3:26:34 PM By: Elpidio Eric BSN, RN Entered By: Elpidio Eric on 11/25/2015 10:37:45 [name] Cooper ([id number]) Trudie Reed,  Eather Colas ([id number]) -------------------------------------------------------------------------------- Multi Wound Chart Details Patient Name: [name] Cooper Date of Service: 11/25/2015 10:45 AM Medical Record Number: [id number] Patient Account Number: [id number] Date of Birth/Sex: 04/17/1940 (75 y.o. Female) Treating RN: Curtis Sites Primary Care Physician: [name] Other Clinician: Referring Physician: [name] Treating Physician/Extender: Rudene Re in Treatment: 2 Vital Signs Height(in): 67 Pulse(bpm): 61 Weight(lbs): 174 Blood Pressure 152/52 (mmHg): Body Mass Index(BMI): 27 Temperature(F): Respiratory Rate 18 (breaths/min): Photos: [1:No Photos] [N/A:N/A] Wound Location: [1:Right Lower Leg - Lateral] [N/A:N/A] Wounding Event: [1:Trauma] [N/A:N/A] Primary Etiology: [1:Dehisced Wound] [N/A:N/A] Secondary Etiology: [1:Lymphedema] [N/A:N/A] Comorbid History: [  1:Anemia, Arrhythmia, Hypertension, Osteoarthritis] [N/A:N/A] Date Acquired: [1:10/05/2015] [N/A:N/A] Weeks of Treatment: [1:2] [N/A:N/A] Wound Status: [1:Open] [N/A:N/A] Measurements L x W x D 5x2x0.2 [N/A:N/A] (cm) Area (cm) : [1:7.854] [N/A:N/A] Volume (cm) : [1:1.571] [N/A:N/A] % Reduction in Area: [1:33.30%] [N/A:N/A] % Reduction in Volume: 55.50% [N/A:N/A] Classification: [1:Full Thickness Without Exposed Support Structures] [N/A:N/A] Exudate Amount: [1:Large] [N/A:N/A] Exudate Type: [1:Serosanguineous] [N/A:N/A] Exudate Color: [1:red, brown] [N/A:N/A] Wound Margin: [1:Distinct, outline attached] [N/A:N/A] Granulation Amount: [1:Small (1-33%)] [N/A:N/A] Granulation Quality: [1:Pink, Pale] [N/A:N/A] Necrotic Amount: [1:Large (67-100%)] [N/A:N/A] Necrotic Tissue: [1:Eschar, Adherent Slough] [N/A:N/A] Exposed Structures: [1:Fascia: No Fat: No] [N/A:N/A] Tendon: No Muscle: No Joint: No Bone: No Limited to Skin Breakdown Epithelialization: None N/A N/A Periwound Skin Texture:  Edema: Yes N/A N/A Excoriation: No Induration: No Callus: No Crepitus: No Fluctuance: No Friable: No Rash: No Scarring: No Periwound Skin Moist: Yes N/A N/A Moisture: Maceration: No Dry/Scaly: No Periwound Skin Color: Atrophie Blanche: No N/A N/A Cyanosis: No Ecchymosis: No Erythema: No Hemosiderin Staining: No Mottled: No Pallor: No Rubor: No Temperature: No Abnormality N/A N/A Tenderness on Yes N/A N/A Palpation: Wound Preparation: Ulcer Cleansing: N/A N/A Rinsed/Irrigated with Saline Topical Anesthetic Applied: Other: lidocaine 4% Treatment Notes Electronic Signature(s) Signed: 11/25/2015 4:27:47 PM By: Curtis Sites Entered By: Curtis Sites on 11/25/2015 10:52:34 [name] Cooper ([id number]) -------------------------------------------------------------------------------- Multi-Disciplinary Care Plan Details Patient Name: [name] Cooper Date of Service: 11/25/2015 10:45 AM Medical Record Number: [id number] Patient Account Number: [id number] Date of Birth/Sex: 1940-08-09 (75 y.o. Female) Treating RN: Curtis Sites Primary Care Physician: [name] Other Clinician: Referring Physician: [name] Treating Physician/Extender: Rudene Re in Treatment: 2 Active Inactive Orientation to the Wound Care Program Nursing Diagnoses: Knowledge deficit related to the wound healing center program Goals: Patient/caregiver will verbalize understanding of the Wound Healing Center Program Date Initiated: 11/11/2015 Goal Status: Active Interventions: Provide education on orientation to the wound center Notes: Wound/Skin Impairment Nursing Diagnoses: Impaired tissue integrity Goals: Patient/caregiver will verbalize understanding of skin care regimen Date Initiated: 11/11/2015 Goal Status: Active Ulcer/skin breakdown will have a volume reduction of 30% by week 4 Date Initiated: 11/11/2015 Goal Status: Active Ulcer/skin breakdown will have a volume  reduction of 50% by week 8 Date Initiated: 11/11/2015 Goal Status: Active Ulcer/skin breakdown will have a volume reduction of 80% by week 12 Date Initiated: 11/11/2015 Goal Status: Active Ulcer/skin breakdown will heal within 14 weeks Date Initiated: 11/11/2015 Goal Status: Active MARCHIA, DIGUGLIELMO ([id number]) Interventions: Assess patient/caregiver ability to obtain necessary supplies Assess patient/caregiver ability to perform ulcer/skin care regimen upon admission and as needed Assess ulceration(s) every visit Notes: Electronic Signature(s) Signed: 11/25/2015 4:27:47 PM By: Curtis Sites Entered By: Curtis Sites on 11/25/2015 10:52:23 [name] Cooper ([id number]) -------------------------------------------------------------------------------- Pain Assessment Details Patient Name: [name] Cooper Date of Service: 11/25/2015 10:45 AM Medical Record Number: [id number] Patient Account Number: [id number] Date of Birth/Sex: [birth year] (76 y.o. Female) Treating RN: Clover Mealy, RN, BSN, [place] Sink Primary Care Physician: [name] Other Clinician: Referring Physician: [name] Treating Physician/Extender: Rudene Re in Treatment: 2 Active Problems Location of Pain Severity and Description of Pain Patient Has Paino Yes Site Locations Pain Location: Pain in Ulcers Rate the pain. Current Pain Level: 4 Worst Pain Level: 7 Character of Pain Describe the Pain: Aching, Burning Pain Management and Medication Current Pain Management: Medication: Yes Rest: Yes How does your pain impact your activities of daily livingo Sleep: Yes Bathing: Yes Appetite: Yes Relationship With Others: Yes Bladder Continence: Yes Emotions: Yes Bowel Continence: Yes Work: Yes Toileting: Yes Drive:  Yes Dressing: Yes Hobbies: Yes Electronic Signature(s) Signed: 11/25/2015 3:26:34 PM By: Elpidio EricAfful, Rita BSN, RN Entered By: Elpidio EricAfful, Rita on 11/25/2015 10:36:35 [name] LittenBURRELL, Gearldene  ([id number]) -------------------------------------------------------------------------------- Patient/Caregiver Education Details Patient Name: [name] LittenBURRELL, Shadia Date of Service: 11/25/2015 10:45 AM Medical Record Number: [id number] Patient Account Number: [id number] Date of Birth/Gender: Aug 15, 1940 (75 y.o. Female) Treating RN: Clover MealyAfful, RN, BSN, [place] Sinkita Primary Care Physician: Jerl MinaHedrick, James Other Clinician: Referring Physician: Jerl MinaHedrick, James Treating Physician/Extender: Rudene ReBritto, Errol Weeks in Treatment: 2 Education Assessment Education Provided To: Patient Education Topics Provided Welcome To The Wound Care Center: Methods: Explain/Verbal Responses: State content correctly Wound Debridement: Methods: Explain/Verbal Responses: State content correctly Wound/Skin Impairment: Methods: Explain/Verbal Responses: State content correctly Electronic Signature(s) Signed: 11/29/2015 4:23:09 PM By: Elpidio EricAfful, Rita BSN, RN Entered By: Elpidio EricAfful, Rita on 11/25/2015 15:38:18 [name] LittenBURRELL, Quanda ([id number]) -------------------------------------------------------------------------------- Wound Assessment Details Patient Name: [name] LittenBURRELL, Audree Date of Service: 11/25/2015 10:45 AM Medical Record Number: [id number] Patient Account Number: [id number] Date of Birth/Sex: Aug 15, 1940 (75 y.o. Female) Treating RN: Clover MealyAfful, RN, BSN, [place] Sinkita Primary Care Physician: Jerl MinaHedrick, James Other Clinician: Referring Physician: Jerl MinaHedrick, James Treating Physician/Extender: Rudene ReBritto, Errol Weeks in Treatment: 2 Wound Status Wound Number: 1 Primary Dehisced Wound Etiology: Wound Location: Right Lower Leg - Lateral Secondary Lymphedema Wounding Event: Trauma Etiology: Date Acquired: 10/05/2015 Wound Status: Open Weeks Of Treatment: 2 Comorbid Anemia, Arrhythmia, Hypertension, Clustered Wound: No History: Osteoarthritis Photos Photo Uploaded By: Elpidio EricAfful, Rita on 11/25/2015 14:32:54 Wound Measurements Length: (cm) 5 Width: (cm) 2 Depth:  (cm) 0.2 Area: (cm) 7.854 Volume: (cm) 1.571 % Reduction in Area: 33.3% % Reduction in Volume: 55.5% Epithelialization: None Tunneling: No Undermining: No Wound Description Full Thickness Without Exposed Classification: Support Structures Wound Margin: Distinct, outline attached Exudate Large Amount: Exudate Type: Serosanguineous Exudate Color: red, brown Foul Odor After Cleansing: No Wound Bed Granulation Amount: Small (1-33%) Exposed Structure Granulation Quality: Pink, Pale Fascia Exposed: No Bomberger, Ulyana ([id number]) Necrotic Amount: Large (67-100%) Fat Layer Exposed: No Necrotic Quality: Eschar, Adherent Slough Tendon Exposed: No Muscle Exposed: No Joint Exposed: No Bone Exposed: No Limited to Skin Breakdown Periwound Skin Texture Texture Color No Abnormalities Noted: No No Abnormalities Noted: No Callus: No Atrophie Blanche: No Crepitus: No Cyanosis: No Excoriation: No Ecchymosis: No Fluctuance: No Erythema: No Friable: No Hemosiderin Staining: No Induration: No Mottled: No Localized Edema: Yes Pallor: No Rash: No Rubor: No Scarring: No Temperature / Pain Moisture Temperature: No Abnormality No Abnormalities Noted: No Tenderness on Palpation: Yes Dry / Scaly: No Maceration: No Moist: Yes Wound Preparation Ulcer Cleansing: Rinsed/Irrigated with Saline Topical Anesthetic Applied: Other: lidocaine 4%, Treatment Notes Wound #1 (Right, Lateral Lower Leg) 1. Cleansed with: Clean wound with Normal Saline 4. Dressing Applied: Santyl Ointment 5. Secondary Dressing Applied Bordered Foam Dressing Dry Gauze Electronic Signature(s) Signed: 11/25/2015 3:26:34 PM By: Elpidio EricAfful, Rita BSN, RN Entered By: Elpidio EricAfful, Rita on 11/25/2015 10:37:14 [name] LittenBURRELL, Cherice ([id number]) -------------------------------------------------------------------------------- Vitals Details Patient Name: [name] LittenBURRELL, Dianelly Date of Service: 11/25/2015 10:45 AM Medical Record  Number: [id number] Patient Account Number: [id number] Date of Birth/Sex: Aug 15, 1940 (75 y.o. Female) Treating RN: Afful, RN, BSN, Rita Primary Care Physician: Jerl MinaHedrick, James Other Clinician: Referring Physician: Jerl MinaHedrick, James Treating Physician/Extender: Rudene ReBritto, Errol Weeks in Treatment: 2 Vital Signs Time Taken: 10:38 Pulse (bpm): 61 Height (in): 67 Respiratory Rate (breaths/min): 18 Weight (lbs): 174 Blood Pressure (mmHg): 152/52 Body Mass Index (BMI): 27.2 Reference Range: 80 - 120 mg / dl Electronic Signature(s) Signed: 11/25/2015 3:26:34 PM By: Elpidio EricAfful, Rita BSN, RN Entered By: Elpidio EricAfful, Rita on 11/25/2015 10:38:19

## 2015-12-02 ENCOUNTER — Encounter: Payer: Medicare Other | Admitting: Surgery

## 2015-12-02 DIAGNOSIS — S81811A Laceration without foreign body, right lower leg, initial encounter: Secondary | ICD-10-CM | POA: Diagnosis not present

## 2015-12-03 NOTE — Progress Notes (Addendum)
[name] LittenBURRELL, Talissa ([id number]) Visit Report for 12/02/2015 Arrival Information Details Patient Name: [name] LittenBURRELL, Linea Date of Service: 12/02/2015 12:45 PM Medical Record Number: [id number] Patient Account Number: [id number] Date of Birth/Sex: [birth year] (75 y.o. Female) Treating RN: Afful, RN, BSN, [place] Sinkita Primary Care Physician: Jerl MinaHedrick, James Other Clinician: Referring Physician: Jerl MinaHedrick, James Treating Physician/Extender: Rudene ReBritto, Errol Weeks in Treatment: 3 Visit Information History Since Last Visit All ordered tests and consults were completed: No Patient Arrived: Gilmer MorCane Added or deleted any medications: No Arrival Time: 12:47 Any new allergies or adverse reactions: No Accompanied By: self Had a fall or experienced change in No Transfer Assistance: None activities of daily living that may affect Patient Identification Verified: Yes risk of falls: Secondary Verification Process Yes Signs or symptoms of abuse/neglect since last No Completed: visito Patient Requires Transmission-Based No Hospitalized since last visit: No Precautions: Has Dressing in Place as Prescribed: Yes Patient Has Alerts: Yes Pain Present Now: No Patient Alerts: Left ABI 1.13 Electronic Signature(s) Signed: 12/02/2015 12:48:10 PM By: Elpidio EricAfful, Rita BSN, RN Entered By: Elpidio EricAfful, Rita on 12/02/2015 12:48:10 [name] LittenBURRELL, Haniyah ([id number]) -------------------------------------------------------------------------------- Encounter Discharge Information Details Patient Name: [name] LittenBURRELL, Consuella Date of Service: 12/02/2015 12:45 PM Medical Record Number: [id number] Patient Account Number: [id number] Date of Birth/Sex: [birth year] (75 y.o. Female) Treating RN: Clover MealyAfful, RN, BSN, [place]inkita Primary Care Physician: Jerl MinaHedrick, James Other Clinician: Referring Physician: Jerl MinaHedrick, James Treating Physician/Extender: Rudene ReBritto, Errol Weeks in Treatment: 3 Encounter Discharge Information Items Discharge Pain Level: 0 Discharge Condition:  Stable Ambulatory Status: Ambulatory Discharge Destination: Home Transportation: Private Auto Accompanied By: self Schedule Follow-up Appointment: No Medication Reconciliation completed No and provided to Patient/Care Valencia Kassa: Patient Clinical Summary of Care: Declined Electronic Signature(s) Signed: 12/02/2015 2:52:21 PM By: Elpidio EricAfful, Rita BSN, RN Previous Signature: 12/02/2015 1:06:25 PM Version By: Gwenlyn PerkingMoore, Shelia Entered By: Elpidio EricAfful, Rita on 12/02/2015 13:06:46 [name] LittenBURRELL, Jazzelle ([id number]) -------------------------------------------------------------------------------- Lower Extremity Assessment Details Patient Name: [name] LittenBURRELL, Edell Date of Service: 12/02/2015 12:45 PM Medical Record Number: [id number] Patient Account Number: [id number] Date of Birth/Sex: [birth year] (75 y.o. Female) Treating RN: Afful, RN, BSN, [place] Sinkita Primary Care Physician: Jerl MinaHedrick, James Other Clinician: Referring Physician: Jerl MinaHedrick, James Treating Physician/Extender: Rudene ReBritto, Errol Weeks in Treatment: 3 Edema Assessment Assessed: Kyra Searles[Left: No] [Right: No] E[Left: dema] [Right: :] Calf Left: Right: Point of Measurement: 35 cm From Medial Instep cm 37.4 cm Ankle Left: Right: Point of Measurement: 9 cm From Medial Instep cm 24 cm Vascular Assessment Claudication: Claudication Assessment [Right:None] Pulses: Posterior Tibial Dorsalis Pedis Palpable: [Right:Yes] Extremity colors, hair growth, and conditions: Extremity Color: [Right:Mottled] Hair Growth on Extremity: [Right:No] Temperature of Extremity: [Right:Warm] Capillary Refill: [Right:< 3 seconds] Electronic Signature(s) Signed: 12/02/2015 12:48:41 PM By: Elpidio EricAfful, Rita BSN, RN Entered By: Elpidio EricAfful, Rita on 12/02/2015 12:48:41 [name] LittenBURRELL, Afia ([id number]) -------------------------------------------------------------------------------- Multi Wound Chart Details Patient Name: [name] LittenBURRELL, Kazuko Date of Service: 12/02/2015 12:45 PM Medical Record Number:  [id number] Patient Account Number: [id number] Date of Birth/Sex: [birth year] (75 y.o. Female) Treating RN: Clover MealyAfful, RN, BSN, [place] Sinkita Primary Care Physician: Jerl MinaHedrick, James Other Clinician: Referring Physician: Jerl MinaHedrick, James Treating Physician/Extender: Rudene ReBritto, Errol Weeks in Treatment: 3 Vital Signs Height(in): 67 Pulse(bpm): 66 Weight(lbs): 174 Blood Pressure 148/66 (mmHg): Body Mass Index(BMI): 27 Temperature(F): 98.1 Respiratory Rate 17 (breaths/min): Photos: [1:No Photos] [N/A:N/A] Wound Location: [1:Right Lower Leg - Lateral] [N/A:N/A] Wounding Event: [1:Trauma] [N/A:N/A] Primary Etiology: [1:Dehisced Wound] [N/A:N/A] Secondary Etiology: [1:Lymphedema] [N/A:N/A] Comorbid History: [1:Anemia, Arrhythmia, Hypertension, Osteoarthritis] [N/A:N/A] Date Acquired: [1:10/05/2015] [N/A:N/A] Weeks of Treatment: [1:3] [N/A:N/A] Wound Status: [1:Open] [N/A:N/A] Measurements L x W x D 5.5x1.8x0.2 [N/A:N/A] (cm) Area (cm) : [  1:7.775] [N/A:N/A] Volume (cm) : [1:1.555] [N/A:N/A] % Reduction in Area: [1:34.00%] [N/A:N/A] % Reduction in Volume: 56.00% [N/A:N/A] Classification: [1:Full Thickness Without Exposed Support Structures] [N/A:N/A] Exudate Amount: [1:Large] [N/A:N/A] Exudate Type: [1:Serosanguineous] [N/A:N/A] Exudate Color: [1:red, brown] [N/A:N/A] Wound Margin: [1:Distinct, outline attached] [N/A:N/A] Granulation Amount: [1:Small (1-33%)] [N/A:N/A] Granulation Quality: [1:Pink, Pale] [N/A:N/A] Necrotic Amount: [1:Small (1-33%)] [N/A:N/A] Exposed Structures: [1:Fascia: No Fat: No Tendon: No] [N/A:N/A] Muscle: No Joint: No Bone: No Limited to Skin Breakdown Epithelialization: Small (1-33%) N/A N/A Periwound Skin Texture: Edema: Yes N/A N/A Excoriation: No Induration: No Callus: No Crepitus: No Fluctuance: No Friable: No Rash: No Scarring: No Periwound Skin Moist: Yes N/A N/A Moisture: Maceration: No Dry/Scaly: No Periwound Skin Color: Atrophie Blanche: No N/A  N/A Cyanosis: No Ecchymosis: No Erythema: No Hemosiderin Staining: No Mottled: No Pallor: No Rubor: No Temperature: No Abnormality N/A N/A Tenderness on Yes N/A N/A Palpation: Wound Preparation: Ulcer Cleansing: N/A N/A Rinsed/Irrigated with Saline Topical Anesthetic Applied: Other: lidocaine 4% Treatment Notes Electronic Signature(s) Signed: 12/02/2015 2:52:21 PM By: Elpidio Eric BSN, RN Entered By: Elpidio Eric on 12/02/2015 12:59:13 [name] Cooper ([id number]) -------------------------------------------------------------------------------- Multi-Disciplinary Care Plan Details Patient Name: [name] Cooper Date of Service: 12/02/2015 12:45 PM Medical Record Number: [id number] Patient Account Number: [id number] Date of Birth/Sex: 1940-05-23 (75 y.o. Female) Treating RN: Afful, RN, BSN, [place] Sink Primary Care Physician: [name] Other Clinician: Referring Physician: [name] Treating Physician/Extender: Rudene Re in Treatment: 3 Active Inactive Orientation to the Wound Care Program Nursing Diagnoses: Knowledge deficit related to the wound healing center program Goals: Patient/caregiver will verbalize understanding of the Wound Healing Center Program Date Initiated: 11/11/2015 Goal Status: Active Interventions: Provide education on orientation to the wound center Notes: Wound/Skin Impairment Nursing Diagnoses: Impaired tissue integrity Goals: Patient/caregiver will verbalize understanding of skin care regimen Date Initiated: 11/11/2015 Goal Status: Active Ulcer/skin breakdown will have a volume reduction of 30% by week 4 Date Initiated: 11/11/2015 Goal Status: Active Ulcer/skin breakdown will have a volume reduction of 50% by week 8 Date Initiated: 11/11/2015 Goal Status: Active Ulcer/skin breakdown will have a volume reduction of 80% by week 12 Date Initiated: 11/11/2015 Goal Status: Active Ulcer/skin breakdown will heal within 14 weeks Date  Initiated: 11/11/2015 Goal Status: Active JAHIRA, SWISS ([id number]) Interventions: Assess patient/caregiver ability to obtain necessary supplies Assess patient/caregiver ability to perform ulcer/skin care regimen upon admission and as needed Assess ulceration(s) every visit Notes: Electronic Signature(s) Signed: 12/02/2015 2:52:21 PM By: Elpidio Eric BSN, RN Entered By: Elpidio Eric on 12/02/2015 12:59:05 [name] Cooper ([id number]) -------------------------------------------------------------------------------- Pain Assessment Details Patient Name: [name] Cooper Date of Service: 12/02/2015 12:45 PM Medical Record Number: [id number] Patient Account Number: [id number] Date of Birth/Sex: 1940/09/16 (75 y.o. Female) Treating RN: Clover Mealy, RN, BSN, [place] Sink Primary Care Physician: [name] Other Clinician: Referring Physician: [name] Treating Physician/Extender: Rudene Re in Treatment: 3 Active Problems Location of Pain Severity and Description of Pain Patient Has Paino No Site Locations With Dressing Change: No Pain Management and Medication Current Pain Management: Electronic Signature(s) Signed: 12/02/2015 12:48:17 PM By: Elpidio Eric BSN, RN Entered By: Elpidio Eric on 12/02/2015 12:48:17 [name] Cooper ([id number]) -------------------------------------------------------------------------------- Patient/Caregiver Education Details Patient Name: [name] Cooper Date of Service: 12/02/2015 12:45 PM Medical Record Number: [id number] Patient Account Number: [id number] Date of Birth/Gender: [birth year] (75 y.o. Female) Treating RN: Afful, RN, BSN, [place] Sink Primary Care Physician: [name] Other Clinician: Referring Physician: [name] Treating Physician/Extender: Rudene Re in Treatment: 3 Education Assessment Education Provided To: Patient Education Topics Provided Basic Hygiene:  Methods: Explain/Verbal Responses: State content  correctly Welcome To The Wound Care Center: Methods: Explain/Verbal Responses: State content correctly Wound Debridement: Methods: Explain/Verbal Responses: State content correctly Wound/Skin Impairment: Methods: Explain/Verbal Responses: State content correctly Electronic Signature(s) Signed: 12/02/2015 2:52:21 PM By: Elpidio Eric BSN, RN Entered By: Elpidio Eric on 12/02/2015 13:07:05 [name] Cooper ([id number]) -------------------------------------------------------------------------------- Wound Assessment Details Patient Name: [name] Cooper Date of Service: 12/02/2015 12:45 PM Medical Record Number: [id number] Patient Account Number: [id number] Date of Birth/Sex: 1940-08-21 (75 y.o. Female) Treating RN: Afful, RN, BSN, Rita Primary Care Physician: [name] Other Clinician: Referring Physician: [name] Treating Physician/Extender: Rudene Re in Treatment: 3 Wound Status Wound Number: 1 Primary Dehisced Wound Etiology: Wound Location: Right Lower Leg - Lateral Secondary Lymphedema Wounding Event: Trauma Etiology: Date Acquired: 10/05/2015 Wound Status: Open Weeks Of Treatment: 3 Comorbid Anemia, Arrhythmia, Hypertension, Clustered Wound: No History: Osteoarthritis Photos Photo Uploaded By: Elpidio Eric on 12/02/2015 14:51:29 Wound Measurements Length: (cm) 5.5 Width: (cm) 1.8 Depth: (cm) 0.2 Area: (cm) 7.775 Volume: (cm) 1.555 % Reduction in Area: 34% % Reduction in Volume: 56% Epithelialization: Small (1-33%) Tunneling: No Undermining: No Wound Description Full Thickness Without Exposed Classification: Support Structures Wound Margin: Distinct, outline attached Exudate Large Amount: Exudate Type: Serosanguineous Exudate Color: red, brown Foul Odor After Cleansing: No Wound Bed Granulation Amount: Small (1-33%) Exposed Structure Granulation Quality: Pink, Pale Fascia Exposed: No Nicolaisen, Dezyrae ([id number]) Necrotic Amount:  Small (1-33%) Fat Layer Exposed: No Necrotic Quality: Adherent Slough Tendon Exposed: No Muscle Exposed: No Joint Exposed: No Bone Exposed: No Limited to Skin Breakdown Periwound Skin Texture Texture Color No Abnormalities Noted: No No Abnormalities Noted: No Callus: No Atrophie Blanche: No Crepitus: No Cyanosis: No Excoriation: No Ecchymosis: No Fluctuance: No Erythema: No Friable: No Hemosiderin Staining: No Induration: No Mottled: No Localized Edema: Yes Pallor: No Rash: No Rubor: No Scarring: No Temperature / Pain Moisture Temperature: No Abnormality No Abnormalities Noted: No Tenderness on Palpation: Yes Dry / Scaly: No Maceration: No Moist: Yes Wound Preparation Ulcer Cleansing: Rinsed/Irrigated with Saline Topical Anesthetic Applied: Other: lidocaine 4%, Treatment Notes Wound #1 (Right, Lateral Lower Leg) 1. Cleansed with: Clean wound with Normal Saline 3. Peri-wound Care: Skin Prep 4. Dressing Applied: Santyl Ointment 5. Secondary Dressing Applied Bordered Foam Dressing Dry Gauze 7. Secured with Patient to wear own compression stockings Support Garment 20-30 mm/Hg pressure to: Electronic Signature(s) Signed: 12/02/2015 2:52:21 PM By: Elpidio Eric BSN, RN Granger, Jaylin ([id number]) Entered By: Elpidio Eric on 12/02/2015 12:54:08 [name] Cooper ([id number]) -------------------------------------------------------------------------------- Vitals Details Patient Name: [name] Cooper Date of Service: 12/02/2015 12:45 PM Medical Record Number: [id number] Patient Account Number: [id number] Date of Birth/Sex: 10-09-1940 (75 y.o. Female) Treating RN: Afful, RN, BSN, Rita Primary Care Physician: [name] Other Clinician: Referring Physician: [name] Treating Physician/Extender: Rudene Re in Treatment: 3 Vital Signs Time Taken: 12:54 Temperature (F): 98.1 Height (in): 67 Pulse (bpm): 66 Weight (lbs): 174 Respiratory Rate  (breaths/min): 17 Body Mass Index (BMI): 27.2 Blood Pressure (mmHg): 148/66 Reference Range: 80 - 120 mg / dl Electronic Signature(s) Signed: 12/02/2015 2:52:21 PM By: Elpidio Eric BSN, RN Entered By: Elpidio Eric on 12/02/2015 12:54:44

## 2015-12-03 NOTE — Progress Notes (Signed)
MIKEA, QUADROS ([id number]) Visit Report for 12/02/2015 Chief Complaint Document Details Patient Name: [name] Cooper, [name] Cooper Date of Service: 12/02/2015 12:45 PM Medical Record Patient Account Number: [id number] Number: Afful, RN, BSN, Treating RN: 20-May-1940 (75 y.o. [place] Sink Date of Birth/Sex: Female) Other Clinician: Primary Care Physician: [name] Treating Evlyn Kanner Referring Physician: [name] Physician/Extender: Tania Ade in Treatment: 3 Information Obtained from: Patient Chief Complaint Patient seen for complaints of Non-Healing Wound to the right lower extremity for about a month Electronic Signature(s) Signed: 12/02/2015 1:22:39 PM By: Evlyn Kanner MD, FACS Entered By: Evlyn Kanner on 12/02/2015 13:22:39 [name] Cooper ([id number]) -------------------------------------------------------------------------------- Debridement Details Patient Name: [name] Cooper Date of Service: 12/02/2015 12:45 PM Medical Record Patient Account Number: [id number] Number: Afful, RN, BSN, Treating RN: Dec 17, 1940 (75 y.o. [place] Sink Date of Birth/Sex: Female) Other Clinician: Primary Care Physician: [name] Treating Evlyn Kanner Referring Physician: [name] Physician/Extender: Tania Ade in Treatment: 3 Debridement Performed for Wound #1 Right,Lateral Lower Leg Assessment: Performed By: Physician Evlyn Kanner, MD Debridement: Debridement Pre-procedure Yes - 12:57 Verification/Time Out Taken: Start Time: 12:57 Pain Control: Lidocaine 4% Topical Solution Level: Skin/Subcutaneous Tissue Total Area Debrided (L x 5.5 (cm) x 1.8 (cm) = 9.9 (cm) W): Tissue and other Non-Viable, Exudate, Fat, Fibrin/Slough, Subcutaneous material debrided: Instrument: Curette Bleeding: Minimum Hemostasis Achieved: Pressure End Time: 13:01 Procedural Pain: 0 Post Procedural Pain: 0 Response to Treatment: Procedure was tolerated well Post Debridement Measurements of  Total Wound Length: (cm) 5.5 Width: (cm) 1.8 Depth: (cm) 0.2 Volume: (cm) 1.555 Character of Wound/Ulcer Post Requires Further Debridement Debridement: Severity of Tissue Post Debridement: Fat layer exposed Post Procedure Diagnosis Same as Pre-procedure Electronic Signature(s) Signed: 12/02/2015 1:22:31 PM By: Evlyn Kanner MD, FACS Signed: 12/02/2015 2:52:21 PM By: Elpidio Eric BSN, RN [place], Guiselle ([id number]) Entered By: Evlyn Kanner on 12/02/2015 13:22:31 [name] Cooper ([id number]) -------------------------------------------------------------------------------- HPI Details Patient Name: [name] Cooper Date of Service: 12/02/2015 12:45 PM Medical Record Patient Account Number: [id number] Number: Afful, RN, BSN, Treating RN: 07-28-1940 (75 y.o. [place] Sink Date of Birth/Sex: Female) Other Clinician: Primary Care Physician: [name] Treating Evlyn Kanner Referring Physician: [name] Physician/Extender: Tania Ade in Treatment: 3 History of Present Illness Location: Patient has an traumatic ulcer on her right leg Quality: Patient reports Tenderness to the affected area(s). Severity: Patient states wound are getting worse. Duration: Patient states the wound has been present for about 2 months prior to seeking help from the [facility] Timing: She verbalized constant pain with periods of intermittent uneasiness Context: The wound occurred when she hit her leg against an object Modifying Factors: patient had sutures applied on the initial encounter, was removed and wound dehisced Associated Signs and Symptoms: Patient reports presence of swelling HPI Description: This is a 75 year old African American female who presented today to the wound clinic for further evaluation of a non-healing ulcer which resulted form trauma. She is alert and oriented x 4, pleasant and seemingly good historian. Appears calm and no acute distress noted. Her history includes but not limited to  hypertension, Angina Pectoris, CAD, fibromyalgia. Wears glasses, ambulates with a cane. Wound debrided during this visit, Will use aquacel ag and bordered foam dressing. Will see patient back on a weekly basis. Patient verbalized understanding of instructions. 11/18/2015 -- the patient does not have any major problems except for hypertension, coronary artery disease with cardiac cath done in November 2012 and a normal ejection fraction at 55%, iron deficiency anemia, chronic kidney disease stage II, venous stasis, fibromyalgia, and has had some  left ankle surgery ( March this year) and in the remote past(2014) has had venous duplex studies done. was a DVT study and was negative for DVT. she has never been a smoker. Electronic Signature(s) Signed: 12/02/2015 1:22:48 PM By: Evlyn Kanner MD, FACS Entered By: Evlyn Kanner on 12/02/2015 13:22:47 [name] Cooper ([id number]) -------------------------------------------------------------------------------- Physical Exam Details Patient Name: [name] Cooper Date of Service: 12/02/2015 12:45 PM Medical Record Patient Account Number: [id number] Number: Afful, RN, BSN, Treating RN: Mar 06, 1941 (75 y.o. [place] Sink Date of Birth/Sex: Female) Other Clinician: Primary Care Physician: [name] Treating Evlyn Kanner Referring Physician: [name] Physician/Extender: Tania Ade in Treatment: 3 Constitutional . Pulse regular. Respirations normal and unlabored. Afebrile. . Eyes Nonicteric. Reactive to light. Ears, Nose, Mouth, and Throat Lips, teeth, and gums WNL.Marland Kitchen Moist mucosa without lesions. Neck supple and nontender. No palpable supraclavicular or cervical adenopathy. Normal sized without goiter. Respiratory WNL. No retractions.. Breath sounds WNL, No rubs, rales, rhonchi, or wheeze.. Cardiovascular Heart rhythm and rate regular, no murmur or gallop.. Pedal Pulses WNL. No clubbing, cyanosis or edema. Lymphatic No adneopathy. No  adenopathy. No adenopathy. Musculoskeletal Adexa without tenderness or enlargement.. Digits and nails w/o clubbing, cyanosis, infection, petechiae, ischemia, or inflammatory conditions.. Integumentary (Hair, Skin) No suspicious lesions. No crepitus or fluctuance. No peri-wound warmth or erythema. No masses.Marland Kitchen Psychiatric Judgement and insight Intact.. No evidence of depression, anxiety, or agitation.. Notes sharp debridement was done with a #3 curet and subcutaneous debridement revealed brisk bleeding which was controlled with pressure Electronic Signature(s) Signed: 12/02/2015 1:23:14 PM By: Evlyn Kanner MD, FACS Entered By: Evlyn Kanner on 12/02/2015 13:23:13 [name] Cooper ([id number]) -------------------------------------------------------------------------------- Physician Orders Details Patient Name: [name] Cooper Date of Service: 12/02/2015 12:45 PM Medical Record Patient Account Number: [id number] Number: Afful, RN, BSN, Treating RN: April 24, 1940 (75 y.o. [place] Sink Date of Birth/Sex: Female) Other Clinician: Primary Care Physician: [name] Treating Evlyn Kanner Referring Physician: [name] Physician/Extender: Tania Ade in Treatment: 3 Verbal / Phone Orders: Yes Clinician: Afful, RN, BSN, Rita Read Back and Verified: Yes Diagnosis Coding Wound Cleansing Wound #1 Right,Lateral Lower Leg o Clean wound with Normal Saline. Anesthetic Wound #1 Right,Lateral Lower Leg o Topical Lidocaine 4% cream applied to wound bed prior to debridement Skin Barriers/Peri-Wound Care Wound #1 Right,Lateral Lower Leg o Skin Prep Primary Wound Dressing Wound #1 Right,Lateral Lower Leg o Santyl Ointment - in wound clinic o Medihoney gel - patient using medihoney Secondary Dressing Wound #1 Right,Lateral Lower Leg o Boardered Foam Dressing Dressing Change Frequency Wound #1 Right,Lateral Lower Leg o Change dressing every other day. Follow-up  Appointments Wound #1 Right,Lateral Lower Leg o Return Appointment in 1 week. Edema Control Wound #1 Right,Lateral Lower Leg o Elevate legs to the level of the heart and pump ankles as often as possible o Support Garment 20-30 mm/Hg pressure to: Bradley, Eather Colas ([id number]) Additional Orders / Instructions Wound #1 Right,Lateral Lower Leg o Increase protein intake. o Activity as tolerated Electronic Signature(s) Signed: 12/02/2015 1:43:20 PM By: Evlyn Kanner MD, FACS Signed: 12/02/2015 2:52:21 PM By: Elpidio Eric BSN, RN Entered By: Elpidio Eric on 12/02/2015 13:00:57 [name] Cooper ([id number]) -------------------------------------------------------------------------------- Problem List Details Patient Name: [name] Cooper Date of Service: 12/02/2015 12:45 PM Medical Record Patient Account Number: [id number] Number: Afful, RN, BSN, Treating RN: Jul 09, 1940 (75 y.o. 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 [place] Sink Date of  Birth/Sex: Female) Other Clinician: Primary Care Physician: [name] Treating Evlyn Kanner Referring Physician: [name] Physician/Extender: Tania Ade in Treatment: 3 Subjective Chief Complaint Information obtained from Patient Patient seen for complaints of Non-Healing Wound to the right lower extremity for about a month History of Present Illness (HPI) The following HPI elements were documented for the patient's wound: Location: Patient has an traumatic ulcer on her right leg Quality: Patient reports Tenderness to the affected area(s). Severity: Patient states wound are getting worse. Duration: Patient states the wound has been present for about 2 months prior to seeking help from the [facility] Timing: She verbalized constant pain with periods of intermittent uneasiness Context: The wound occurred when she hit her leg against an object Modifying Factors: patient had sutures applied on the initial encounter, was removed and wound dehisced Associated Signs and Symptoms: Patient reports presence of swelling This is a 75 year old African American female who presented today to the wound clinic for further evaluation of a non-healing ulcer which resulted form trauma. She is alert and oriented x 4, pleasant and seemingly good historian. Appears calm and no acute distress noted. Her history includes but not limited to hypertension, Angina Pectoris, CAD, fibromyalgia. Wears glasses, ambulates with a cane. Wound debrided during this visit, Will use aquacel ag and bordered foam dressing. Will see patient back on a weekly basis. Patient verbalized understanding of instructions. 11/18/2015 -- the patient does not have any major problems except for hypertension, coronary artery disease with cardiac cath done in November 2012 and a normal ejection fraction at 55%, iron deficiency anemia, chronic kidney disease stage II, venous stasis, fibromyalgia, and has had some left ankle surgery ( March  this year) and in the remote past(2014) has had venous duplex studies done. was a DVT study and was negative for DVT. she has never been a smoker. Objective ZENOVIA, JUSTMAN ([id number]) Constitutional Pulse regular. Respirations normal and unlabored. Afebrile. Vitals Time Taken: 12:54 PM, Height: 67 in, Weight: 174 lbs, BMI: 27.2, Temperature: 98.1 F, Pulse: 66 bpm, Respiratory Rate: 17 breaths/min, Blood Pressure: 148/66 mmHg. Eyes Nonicteric. Reactive to light. Ears, Nose, Mouth, and Throat Lips, teeth, and gums WNL.Marland Kitchen Moist mucosa without lesions. Neck supple and nontender. No palpable supraclavicular or cervical adenopathy. Normal sized without goiter. Respiratory WNL. No retractions.. Breath sounds WNL, No rubs, rales, rhonchi, or wheeze.. Cardiovascular Heart rhythm and rate regular, no murmur or gallop.. Pedal Pulses WNL. No clubbing, cyanosis or edema. Lymphatic No adneopathy. No adenopathy. No adenopathy. Musculoskeletal Adexa without tenderness or enlargement.. Digits and nails w/o clubbing, cyanosis, infection, petechiae, ischemia, or inflammatory conditions.Marland Kitchen Psychiatric Judgement and insight Intact.. No evidence of depression, anxiety, or agitation.. General Notes: sharp debridement was done with a #3 curet and subcutaneous debridement revealed brisk bleeding which was controlled with pressure Integumentary (Hair, Skin) No suspicious lesions. No crepitus or fluctuance. No peri-wound warmth or erythema. No masses.. Wound #1 status is Open. Original cause of wound was Trauma. The wound is located on the Right,Lateral  Lower Leg. The wound measures 5.5cm length x 1.8cm width x 0.2cm depth; 7.775cm^2 area and 1.555cm^3 volume. The wound is limited to skin breakdown. There is no tunneling or undermining noted. There is a large amount of serosanguineous drainage noted. The wound margin is distinct with the outline attached to the wound base. There is small (1-33%) pink, pale  granulation within the wound bed. There is a small (1-33%) amount of necrotic tissue within the wound bed including Adherent Slough. The periwound skin appearance exhibited: Localized Edema, Moist. The periwound skin appearance did not exhibit: Callus, Crepitus, Excoriation, Fluctuance, Friable, Induration, Rash, Scarring, Dry/Scaly, Maceration, Atrophie Blanche, Cyanosis, Ecchymosis, Hemosiderin Staining, Mottled, Pallor, Rubor, Erythema. Periwound temperature was noted as No Abnormality. The periwound has tenderness on palpation. 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 There was a Skin/Subcutaneous Tissue Debridement ([id number]) debridement with total area of 9.9 sq cm performed by Evlyn Kanner, MD. with the following instrument(s): Curette to remove Non-Viable tissue/material including Exudate, Fat, Fibrin/Slough, and Subcutaneous after achieving pain control using Lidocaine 4% Topical Solution. A time out was conducted at 12:57, prior to the start of the procedure. A Minimum amount of bleeding was controlled with Pressure. The procedure was tolerated well with a pain level of 0 throughout and a pain level of 0 following the procedure. Post Debridement Measurements: 5.5cm length x 1.8cm width x 0.2cm depth; 1.555cm^3 volume. Character of Wound/Ulcer Post Debridement requires further debridement. Severity of Tissue Post Debridement is: Fat layer exposed. Post procedure Diagnosis Wound #1: Same as Pre-Procedure Plan Wound Cleansing: Wound #1 Right,Lateral Lower Leg: Clean wound with Normal Saline. Anesthetic: Wound #1 Right,Lateral Lower Leg: Topical Lidocaine 4% cream  applied to wound bed prior to debridement Skin Barriers/Peri-Wound Care: Wound #1 Right,Lateral Lower Leg: Skin Prep Primary Wound Dressing: Wound #1 Right,Lateral Lower Leg: CHERYAL, SALAS ([id number]) Santyl Ointment - in wound clinic Medihoney gel - patient using medihoney Secondary Dressing: Wound #1 Right,Lateral Lower Leg: Boardered Foam Dressing Dressing Change Frequency: Wound #1 Right,Lateral Lower Leg: Change dressing every other day. Follow-up Appointments: Wound #1 Right,Lateral Lower Leg: Return Appointment in 1 week. Edema Control: Wound #1 Right,Lateral Lower Leg: Elevate legs to the level of the heart and pump ankles as often as possible Support Garment 20-30 mm/Hg pressure to: Additional Orders / Instructions: Wound #1 Right,Lateral Lower Leg: Increase protein intake. Activity as tolerated after review of wounds I have recommended: 1. Medihoney ointment to be applied daily with a bordered foam and then 2. Compression stockings of the 20-30 mm variety and I have discussed details of how this should be worn. 3. Regular visits to the wound center Electronic Signature(s) Signed: 12/02/2015 1:23:34 PM By: Evlyn Kanner MD, FACS Entered By: Evlyn Kanner on 12/02/2015 13:23:34 [name] Cooper ([id number]) -------------------------------------------------------------------------------- SuperBill Details Patient Name: [name] Cooper Date of Service: 12/02/2015 Medical Record Patient Account Number: [id number] Number: Afful, RN, BSN, Treating RN: 29-Jun-1940 (75 y.o. [place] Sink Date of Birth/Sex: Female) Other Clinician: Primary Care Physician: [name] Treating Evlyn Kanner Referring Physician: [name] Physician/Extender: Tania Ade in Treatment: 3 Diagnosis Coding ICD-10 Codes Code Description [phone] Laceration without foreign body, right lower leg, initial encounter I89.0 Lymphedema, not elsewhere classified L97.212 Non-pressure chronic  ulcer of right calf with fat layer  exposed Facility Procedures CPT4 Code Description: [id number] 11042 - DEB SUBQ TISSUE 20 SQ CM/< ICD-10 Description Diagnosis S81.811A Laceration without foreign body, right lower leg, init I89.0 Lymphedema, not elsewhere classified L97.212 Non-pressure chronic ulcer of right calf  with fat laye Modifier: ial encounter r exposed Quantity: 1 Physician Procedures CPT4 Code Description: [id number] 11042 - WC PHYS SUBQ TISS 20 SQ CM ICD-10 Description Diagnosis S81.811A Laceration without foreign body, right lower leg, init I89.0 Lymphedema, not elsewhere classified L97.212 Non-pressure chronic ulcer of right calf  with fat laye Modifier: ial encounter r exposed Quantity: 1 Electronic Signature(s) Signed: 12/02/2015 1:23:48 PM By: Evlyn KannerBritto, Attie Nawabi MD, FACS Entered By: Evlyn KannerBritto, Gwendlyn Hanback on 12/02/2015 13:23:48

## 2015-12-09 ENCOUNTER — Ambulatory Visit: Payer: Medicare Other | Admitting: Surgery

## 2015-12-16 ENCOUNTER — Encounter: Payer: Medicare Other | Admitting: Surgery

## 2015-12-16 DIAGNOSIS — S81811A Laceration without foreign body, right lower leg, initial encounter: Secondary | ICD-10-CM | POA: Diagnosis not present

## 2015-12-17 NOTE — Progress Notes (Signed)
MELESSIA, [name] Cooper ([id number]) Visit Report for 12/16/2015 Arrival Information Details Patient Name: [name] Cooper, [name] Cooper Date of Service: 12/16/2015 10:00 AM Medical Record Number: [id number] Patient Account Number: [id number] Date of Birth/Sex: 11-01-1940 (75 y.o. Female) Treating RN: Afful, RN, BSN, [place] Sink Primary Care Physician: [name] Other Clinician: Referring Physician: [name] Treating Physician/Extender: Rudene Re in Treatment: 5 Visit Information History Since Last Visit All ordered tests and consults were completed: No Patient Arrived: Ambulatory Added or deleted any medications: No Arrival Time: 09:58 Any new allergies or adverse reactions: No Accompanied By: self Had a fall or experienced change in No Transfer Assistance: None activities of daily living that may affect Patient Identification Verified: Yes risk of falls: Secondary Verification Process Yes Signs or symptoms of abuse/neglect since last No Completed: visito Patient Requires Transmission-Based No Hospitalized since last visit: No Precautions: Has Dressing in Place as Prescribed: Yes Patient Has Alerts: Yes Pain Present Now: No Patient Alerts: Left ABI 1.13 Electronic Signature(s) Signed: 12/16/2015 4:34:10 PM By: Elpidio Eric BSN, RN Entered By: Elpidio Eric on 12/16/2015 09:59:54 [name] Cooper ([id number]) -------------------------------------------------------------------------------- Encounter Discharge Information Details Patient Name: [name] Cooper Date of Service: 12/16/2015 10:00 AM Medical Record Number: [id number] Patient Account Number: [id number] Date of Birth/Sex: 1940/09/28 (75 y.o. Female) Treating RN: Clover Mealy, RN, BSN, [place] Sink Primary Care Physician: [name] Other Clinician: Referring Physician: [name] Treating Physician/Extender: Rudene Re in Treatment: 5 Encounter Discharge Information Items Discharge Pain Level: 0 Discharge Condition:  Stable Ambulatory Status: Ambulatory Discharge Destination: Home Transportation: Private Auto Accompanied By: self Schedule Follow-up Appointment: No Medication Reconciliation completed and provided to Patient/Care No Jamison Yuhasz: Provided on Clinical Summary of Care: 12/16/2015 Form Type Recipient Paper Patient DB Electronic Signature(s) Signed: 12/16/2015 10:29:58 AM By: Elpidio Eric BSN, RN Previous Signature: 12/16/2015 10:17:51 AM Version By: Gwenlyn Perking Entered By: Elpidio Eric on 12/16/2015 10:29:58 [name] Cooper ([id number]) -------------------------------------------------------------------------------- Lower Extremity Assessment Details Patient Name: [name] Cooper Date of Service: 12/16/2015 10:00 AM Medical Record Number: [id number] Patient Account Number: [id number] Date of Birth/Sex: [birth year] (75 y.o. Female) Treating RN: Afful, RN, BSN, [place] Sink Primary Care Physician: [name] Other Clinician: Referring Physician: [name] Treating Physician/Extender: Rudene Re in Treatment: 5 Edema Assessment Assessed: Kyra Searles: No] [Right: No] E[Left: dema] [Right: :] Calf Left: Right: Point of Measurement: 35 cm From Medial Instep cm 37.4 cm Ankle Left: Right: Point of Measurement: 9 cm From Medial Instep cm 23.6 cm Vascular Assessment Claudication: Claudication Assessment [Right:None] Pulses: Posterior Tibial Dorsalis Pedis Palpable: [Right:Yes] Extremity colors, hair growth, and conditions: Extremity Color: [Right:Mottled] Hair Growth on Extremity: [Right:No] Temperature of Extremity: [Right:Warm] Capillary Refill: [Right:< 3 seconds] Electronic Signature(s) Signed: 12/16/2015 4:34:10 PM By: Elpidio Eric BSN, RN Entered By: Elpidio Eric on 12/16/2015 10:03:00 [name] Cooper ([id number]) -------------------------------------------------------------------------------- Multi Wound Chart Details Patient Name: [name] Cooper Date of Service:  12/16/2015 10:00 AM Medical Record Number: [id number] Patient Account Number: [id number] Date of Birth/Sex: 05-12-1940 (75 y.o. Female) Treating RN: Clover Mealy, RN, BSN, [place] Sink Primary Care Physician: [name] Other Clinician: Referring Physician: [name] Treating Physician/Extender: Rudene Re in Treatment: 5 Vital Signs Height(in): 67 Pulse(bpm): 61 Weight(lbs): 174 Blood Pressure 144/56 (mmHg): Body Mass Index(BMI): 27 Temperature(F): 98.1 Respiratory Rate 18 (breaths/min): Photos: [1:No Photos] [N/A:N/A] Wound Location: [1:Right Lower Leg - Lateral] [N/A:N/A] Wounding Event: [1:Trauma] [N/A:N/A] Primary Etiology: [1:Dehisced Wound] [N/A:N/A] Secondary Etiology: [1:Lymphedema] [N/A:N/A] Comorbid History: [1:Anemia, Arrhythmia, Hypertension, Osteoarthritis] [N/A:N/A] Date Acquired: [1:10/05/2015] [N/A:N/A] Weeks of Treatment: [1:5] [N/A:N/A] Wound Status: [1:Open] [N/A:N/A] Measurements L x W x  D 4x1x0.2 [N/A:N/A] (cm) Area (cm) : [1:3.142] [N/A:N/A] Volume (cm) : [1:0.628] [N/A:N/A] % Reduction in Area: [1:73.30%] [N/A:N/A] % Reduction in Volume: 82.20% [N/A:N/A] Classification: [1:Full Thickness Without Exposed Support Structures] [N/A:N/A] Exudate Amount: [1:Large] [N/A:N/A] Exudate Type: [1:Serosanguineous] [N/A:N/A] Exudate Color: [1:red, brown] [N/A:N/A] Wound Margin: [1:Distinct, outline attached] [N/A:N/A] Granulation Amount: [1:Small (1-33%)] [N/A:N/A] Granulation Quality: [1:Pink, Pale] [N/A:N/A] Necrotic Amount: [1:Small (1-33%)] [N/A:N/A] Exposed Structures: [1:Fascia: No Fat: No Tendon: No] [N/A:N/A] Muscle: No Joint: No Bone: No Limited to Skin Breakdown Epithelialization: Medium (34-66%) N/A N/A Periwound Skin Texture: Edema: Yes N/A N/A Excoriation: No Induration: No Callus: No Crepitus: No Fluctuance: No Friable: No Rash: No Scarring: No Periwound Skin Moist: Yes N/A N/A Moisture: Maceration: No Dry/Scaly:  No Periwound Skin Color: Atrophie Blanche: No N/A N/A Cyanosis: No Ecchymosis: No Erythema: No Hemosiderin Staining: No Mottled: No Pallor: No Rubor: No Temperature: No Abnormality N/A N/A Tenderness on Yes N/A N/A Palpation: Wound Preparation: Ulcer Cleansing: N/A N/A Rinsed/Irrigated with Saline Topical Anesthetic Applied: Other: lidocaine 4% Treatment Notes Electronic Signature(s) Signed: 12/16/2015 4:34:10 PM By: Elpidio Eric BSN, RN Entered By: Elpidio Eric on 12/16/2015 10:07:05 [name] Cooper ([id number]) -------------------------------------------------------------------------------- Multi-Disciplinary Care Plan Details Patient Name: [name] Cooper Date of Service: 12/16/2015 10:00 AM Medical Record Number: [id number] Patient Account Number: [id number] Date of Birth/Sex: [birth year] (75 y.o. Female) Treating RN: Afful, RN, BSN, [place] Sink Primary Care Physician: [name] Other Clinician: Referring Physician: [name] Treating Physician/Extender: Rudene Re in Treatment: 5 Active Inactive Orientation to the Wound Care Program Nursing Diagnoses: Knowledge deficit related to the wound healing center program Goals: Patient/caregiver will verbalize understanding of the Wound Healing Center Program Date Initiated: 11/11/2015 Goal Status: Active Interventions: Provide education on orientation to the wound center Notes: Wound/Skin Impairment Nursing Diagnoses: Impaired tissue integrity Goals: Patient/caregiver will verbalize understanding of skin care regimen Date Initiated: 11/11/2015 Goal Status: Active Ulcer/skin breakdown will have a volume reduction of 30% by week 4 Date Initiated: 11/11/2015 Goal Status: Active Ulcer/skin breakdown will have a volume reduction of 50% by week 8 Date Initiated: 11/11/2015 Goal Status: Active Ulcer/skin breakdown will have a volume reduction of 80% by week 12 Date Initiated: 11/11/2015 Goal Status:  Active Ulcer/skin breakdown will heal within 14 weeks Date Initiated: 11/11/2015 Goal Status: Active [name] Cooper, [name] Cooper ([id number]) Interventions: Assess patient/caregiver ability to obtain necessary supplies Assess patient/caregiver ability to perform ulcer/skin care regimen upon admission and as needed Assess ulceration(s) every visit Notes: Electronic Signature(s) Signed: 12/16/2015 4:34:10 PM By: Elpidio Eric BSN, RN Entered By: Elpidio Eric on 12/16/2015 10:06:46 [name] Cooper ([id number]) -------------------------------------------------------------------------------- Pain Assessment Details Patient Name: [name] Cooper Date of Service: 12/16/2015 10:00 AM Medical Record Number: [id number] Patient Account Number: [id number] Date of Birth/Sex: [birth year] (75 y.o. Female) Treating RN: Clover Mealy, RN, BSN, [place] Sink Primary Care Physician: [name] Other Clinician: Referring Physician: [name] Treating Physician/Extender: Rudene Re in Treatment: 5 Active Problems Location of Pain Severity and Description of Pain Patient Has Paino No Site Locations With Dressing Change: No Pain Management and Medication Current Pain Management: Electronic Signature(s) Signed: 12/16/2015 4:34:10 PM By: Elpidio Eric BSN, RN Entered By: Elpidio Eric on 12/16/2015 10:00:11 [name] Cooper ([id number]) -------------------------------------------------------------------------------- Patient/Caregiver Education Details Patient Name: [name] Cooper Date of Service: 12/16/2015 10:00 AM Medical Record Number: [id number] Patient Account Number: [id number] Date of Birth/Gender: October 28, 1940 (75 y.o. Female) Treating RN: Afful, RN, BSN, [place] Sink Primary Care Physician: [name] Other Clinician: Referring Physician: [name] Treating Physician/Extender: Rudene Re in Treatment: 5 Education Assessment Education Provided  To: Patient Education Topics Provided Welcome To The  Wound Care Center: Methods: Explain/Verbal Responses: State content correctly Wound Debridement: Methods: Explain/Verbal Wound/Skin Impairment: Methods: Explain/Verbal Responses: State content correctly Electronic Signature(s) Signed: 12/16/2015 4:34:10 PM By: Elpidio EricAfful, Rita BSN, RN Entered By: Elpidio EricAfful, Rita on 12/16/2015 10:31:05 [name] Cooper, [name] Cooper ([id number]) -------------------------------------------------------------------------------- Wound Assessment Details Patient Name: [name] Cooper, [name] Cooper Date of Service: 12/16/2015 10:00 AM Medical Record Number: [id number] Patient Account Number: [id number] Date of Birth/Sex: 14-Jan-1941 (75 y.o. Female) Treating RN: Afful, RN, BSN, Rita Primary Care Physician: Jerl MinaHedrick, James Other Clinician: Referring Physician: Jerl MinaHedrick, James Treating Physician/Extender: Rudene ReBritto, Errol Weeks in Treatment: 5 Wound Status Wound Number: 1 Primary Dehisced Wound Etiology: Wound Location: Right Lower Leg - Lateral Secondary Lymphedema Wounding Event: Trauma Etiology: Date Acquired: 10/05/2015 Wound Status: Open Weeks Of Treatment: 5 Comorbid Anemia, Arrhythmia, Hypertension, Clustered Wound: No History: Osteoarthritis Photos Photo Uploaded By: Elpidio EricAfful, Rita on 12/16/2015 12:01:09 Wound Measurements Length: (cm) 4 Width: (cm) 1 Depth: (cm) 0.2 Area: (cm) 3.142 Volume: (cm) 0.628 % Reduction in Area: 73.3% % Reduction in Volume: 82.2% Epithelialization: Medium (34-66%) Tunneling: No Undermining: No Wound Description Full Thickness Without Exposed Classification: Support Structures Wound Margin: Distinct, outline attached Exudate Large Amount: Exudate Type: Serosanguineous Exudate Color: red, brown Foul Odor After Cleansing: No Wound Bed Granulation Amount: Small (1-33%) Exposed Structure Granulation Quality: Pink, Pale Fascia Exposed: No [place], [place] ([id number]) Necrotic Amount: Small (1-33%) Fat Layer Exposed: No Necrotic Quality:  Adherent Slough Tendon Exposed: No Muscle Exposed: No Joint Exposed: No Bone Exposed: No Limited to Skin Breakdown Periwound Skin Texture Texture Color No Abnormalities Noted: No No Abnormalities Noted: No Callus: No Atrophie Blanche: No Crepitus: No Cyanosis: No Excoriation: No Ecchymosis: No Fluctuance: No Erythema: No Friable: No Hemosiderin Staining: No Induration: No Mottled: No Localized Edema: Yes Pallor: No Rash: No Rubor: No Scarring: No Temperature / Pain Moisture Temperature: No Abnormality No Abnormalities Noted: No Tenderness on Palpation: Yes Dry / Scaly: No Maceration: No Moist: Yes Wound Preparation Ulcer Cleansing: Rinsed/Irrigated with Saline Topical Anesthetic Applied: Other: lidocaine 4%, Treatment Notes Wound #1 (Right, Lateral Lower Leg) 1. Cleansed with: Clean wound with Normal Saline 4. Dressing Applied: Aquacel Ag 5. Secondary Dressing Applied Bordered Foam Dressing Electronic Signature(s) Signed: 12/16/2015 4:34:10 PM By: Elpidio EricAfful, Rita BSN, RN Entered By: Elpidio EricAfful, Rita on 12/16/2015 10:06:40 [name] Cooper, [name] Cooper ([id number]) -------------------------------------------------------------------------------- Vitals Details Patient Name: [name] Cooper, [name] Cooper Date of Service: 12/16/2015 10:00 AM Medical Record Number: [id number] Patient Account Number: [id number] Date of Birth/Sex: 14-Jan-1941 (75 y.o. Female) Treating RN: Afful, RN, BSN, Rita Primary Care Physician: Jerl MinaHedrick, James Other Clinician: Referring Physician: Jerl MinaHedrick, James Treating Physician/Extender: Rudene ReBritto, Errol Weeks in Treatment: 5 Vital Signs Time Taken: 10:00 Temperature (F): 98.1 Height (in): 67 Pulse (bpm): 61 Weight (lbs): 174 Respiratory Rate (breaths/min): 18 Body Mass Index (BMI): 27.2 Blood Pressure (mmHg): 144/56 Reference Range: 80 - 120 mg / dl Electronic Signature(s) Signed: 12/16/2015 4:34:10 PM By: Elpidio EricAfful, Rita BSN, RN Entered By: Elpidio EricAfful, Rita on 12/16/2015  10:00:37

## 2015-12-17 NOTE — Progress Notes (Signed)
DELANY, STEURY ([id number]) Visit Report for 12/16/2015 Chief Complaint Document Details Patient Name: [name] Cooper, [name] Cooper 12/16/2015 10:00 Date of Service: AM Medical Record [id number] Number: Patient Account Number: [id number] October 24, 1940 (75 y.o. Treating RN: Clover Mealy, RN, BSN, [place] Sink Date of Birth/Sex: Female) Other Clinician: Primary Care Physician: [name] Treating Evlyn Kanner Referring Physician: [name] Physician/Extender: Tania Ade in Treatment: 5 Information Obtained from: Patient Chief Complaint Patient seen for complaints of Non-Healing Wound to the right lower extremity for about a month Electronic Signature(s) Signed: 12/16/2015 10:25:08 AM By: Evlyn Kanner MD, FACS Entered By: Evlyn Kanner on 12/16/2015 10:25:07 [name] Litten ([id number]) -------------------------------------------------------------------------------- Debridement Details Patient Name: [name] Cooper 12/16/2015 10:00 Date of Service: AM Medical Record [id number] Number: Patient Account Number: [id number] [birth year] (75 y.o. Treating RN: Clover Mealy, RN, BSN, [place] Sink Date of Birth/Sex: Female) Other Clinician: Primary Care Physician: [name] Treating Evlyn Kanner Referring Physician: [name] Physician/Extender: Weeks in Treatment: 5 Debridement Performed for Wound #1 Right,Lateral Lower Leg Assessment: Performed By: Physician Evlyn Kanner, MD Debridement: Debridement Pre-procedure Yes - 10:10 Verification/Time Out Taken: Start Time: 10:10 Pain Control: Lidocaine 4% Topical Solution Level: Skin/Subcutaneous Tissue Total Area Debrided (L x 4 (cm) x 1 (cm) = 4 (cm) W): Tissue and other Non-Viable, Exudate, Fibrin/Slough, Subcutaneous material debrided: Instrument: Curette Bleeding: Minimum Hemostasis Achieved: Pressure End Time: 10:15 Procedural Pain: 0 Post Procedural Pain: 0 Response to Treatment: Procedure was tolerated well Post Debridement Measurements of Total  Wound Length: (cm) 4 Width: (cm) 1 Depth: (cm) 0.2 Volume: (cm) 0.628 Character of Wound/Ulcer Post Requires Further Debridement Debridement: Severity of Tissue Post Debridement: Fat layer exposed Post Procedure Diagnosis Same as Pre-procedure Electronic Signature(s) Signed: 12/16/2015 10:25:00 AM By: Evlyn Kanner MD, FACS Signed: 12/16/2015 4:34:10 PM By: Elpidio Eric BSN, RN [place], [place] ([id number]) Entered By: Evlyn Kanner on 12/16/2015 10:24:59 [name] Litten ([id number]) -------------------------------------------------------------------------------- HPI Details Patient Name: [name] Cooper 12/16/2015 10:00 Date of Service: AM Medical Record [id number] Number: Patient Account Number: [id number] December 19, 1940 (75 y.o. Treating RN: Clover Mealy, RN, BSN, [place] Sink Date of Birth/Sex: Female) Other Clinician: Primary Care Physician: [name] Treating Evlyn Kanner Referring Physician: [name] Physician/Extender: Weeks in Treatment: 5 History of Present Illness Location: Patient has an traumatic ulcer on her right leg Quality: Patient reports Tenderness to the affected area(s). Severity: Patient states wound are getting worse. Duration: Patient states the wound has been present for about 2 months prior to seeking help from the [facility] Timing: She verbalized constant pain with periods of intermittent uneasiness Context: The wound occurred when she hit her leg against an object Modifying Factors: patient had sutures applied on the initial encounter, was removed and wound dehisced Associated Signs and Symptoms: Patient reports presence of swelling HPI Description: This is a 75 year old African American female who presented today to the wound clinic for further evaluation of a non-healing ulcer which resulted form trauma. She is alert and oriented x 4, pleasant and seemingly good historian. Appears calm and no acute distress noted. Her history includes but not limited to  hypertension, Angina Pectoris, CAD, fibromyalgia. Wears glasses, ambulates with a cane. Wound debrided during this visit, Will use aquacel ag and bordered foam dressing. Will see patient back on a weekly basis. Patient verbalized understanding of instructions. 11/18/2015 -- the patient does not have any major problems except for hypertension, coronary artery disease with cardiac cath done in November 2012 and a normal ejection fraction at 55%, iron deficiency anemia, chronic kidney disease stage II, venous stasis, fibromyalgia, and has had some left  ankle surgery ( March this year) and in the remote past(2014) has had venous duplex studies done. was a DVT study and was negative for DVT. she has never been a smoker. Electronic Signature(s) Signed: 12/16/2015 10:25:14 AM By: Evlyn KannerBritto, Jamear Carbonneau MD, FACS Entered By: Evlyn KannerBritto, Sayler Mickiewicz on 12/16/2015 10:25:14 [name] LittenBURRELL, Klara ([id number]) -------------------------------------------------------------------------------- Physical Exam Details Patient Name: [name] Cooper 12/16/2015 10:00 Date of Service: AM Medical Record [id number] Number: Patient Account Number: [id number] 24-Jul-1940 (75 y.o. Treating RN: Clover MealyAfful, RN, BSN, [place] Sinkita Date of Birth/Sex: Female) Other Clinician: Primary Care Physician: Lavena BullionHedrick, James Treating Cardelia Sassano Referring Physician: Jerl MinaHedrick, James Physician/Extender: Weeks in Treatment: 5 Constitutional . Pulse regular. Respirations normal and unlabored. Afebrile. . Eyes Nonicteric. Reactive to light. Ears, Nose, Mouth, and Throat Lips, teeth, and gums WNL.Marland Kitchen. Moist mucosa without lesions. Neck supple and nontender. No palpable supraclavicular or cervical adenopathy. Normal sized without goiter. Respiratory WNL. No retractions.. Cardiovascular Pedal Pulses WNL. No clubbing, cyanosis or edema. Lymphatic No adneopathy. No adenopathy. No adenopathy. Musculoskeletal Adexa without tenderness or enlargement.. Digits and nails w/o  clubbing, cyanosis, infection, petechiae, ischemia, or inflammatory conditions.. Integumentary (Hair, Skin) No suspicious lesions. No crepitus or fluctuance. No peri-wound warmth or erythema. No masses.Marland Kitchen. Psychiatric Judgement and insight Intact.. No evidence of depression, anxiety, or agitation.. Notes the patient required sharp debridement with #3 curet and subcut debris was removed and the wound underneath has healthy granulation tissue. Electronic Signature(s) Signed: 12/16/2015 10:26:02 AM By: Evlyn KannerBritto, Keanu Lesniak MD, FACS Entered By: Evlyn KannerBritto, Maguire Sime on 12/16/2015 10:26:02 [name] LittenBURRELL, Lilly ([id number]) -------------------------------------------------------------------------------- Physician Orders Details Patient Name: [name] LittenBURRELL, Lesleyanne 12/16/2015 10:00 Date of Service: AM Medical Record [id number] Number: Patient Account Number: [id number] 24-Jul-1940 (75 y.o. Treating RN: Clover MealyAfful, RN, BSN, [place] Sinkita Date of Birth/Sex: Female) Other Clinician: Primary Care Physician: Jerl MinaHedrick, James Treating Evlyn KannerBritto, Cashawn Yanko Referring Physician: Jerl MinaHedrick, James Physician/Extender: Tania AdeWeeks in Treatment: 5 Verbal / Phone Orders: Yes Clinician: Afful, RN, BSN, Rita Read Back and Verified: Yes Diagnosis Coding Wound Cleansing Wound #1 Right,Lateral Lower Leg o Clean wound with Normal Saline. Anesthetic Wound #1 Right,Lateral Lower Leg o Topical Lidocaine 4% cream applied to wound bed prior to debridement Skin Barriers/Peri-Wound Care Wound #1 Right,Lateral Lower Leg o Skin Prep Primary Wound Dressing Wound #1 Right,Lateral Lower Leg o Aquacel Ag Secondary Dressing Wound #1 Right,Lateral Lower Leg o Boardered Foam Dressing Dressing Change Frequency Wound #1 Right,Lateral Lower Leg o Change dressing every other day. Follow-up Appointments Wound #1 Right,Lateral Lower Leg o Return Appointment in 1 week. Edema Control Wound #1 Right,Lateral Lower Leg o Elevate legs to the level of the heart  and pump ankles as often as possible o Support Garment 20-30 mm/Hg pressure to: [place], Eather ColasELORES ([id number]) Additional Orders / Instructions Wound #1 Right,Lateral Lower Leg o Increase protein intake. o Activity as tolerated o Other: - Zinc, MVI, Vit A, C Electronic Signature(s) Signed: 12/16/2015 3:52:17 PM By: Evlyn KannerBritto, Mahina Salatino MD, FACS Signed: 12/16/2015 4:34:10 PM By: Elpidio EricAfful, Rita BSN, RN Entered By: Elpidio EricAfful, Rita on 12/16/2015 10:14:12 [name] LittenBURRELL, Dejanique ([id number]) -------------------------------------------------------------------------------- Problem List Details Patient Name: [name] LittenBURRELL, Ottie 12/16/2015 10:00 Date of Service: AM Medical Record [id number] Number: Patient Account Number: [id number] 24-Jul-1940 (75 y.o. Treating RN: Clover MealyAfful, RN, BSN, [place] Sinkita Date of Birth/Sex: Female) Other Clinician: Primary Care Physician: Jerl MinaHedrick, James Treating Evlyn KannerBritto, Shaunae Sieloff Referring Physician: Jerl MinaHedrick, James Physician/Extender: Tania AdeWeeks in Treatment: 5 Active Problems ICD-10 Encounter Code Description Active Date Diagnosis S81.811A Laceration without foreign body, right lower leg, initial 12/02/2015 Yes encounter I89.0 Lymphedema, not elsewhere classified 12/02/2015 Yes L97.212 Non-pressure chronic ulcer of right  calf with fat layer 12/02/2015 Yes exposed Inactive Problems Resolved Problems Electronic Signature(s) Signed: 12/16/2015 10:24:41 AM By: Evlyn Kanner MD, FACS Entered By: Evlyn Kanner on 12/16/2015 10:24:41 [name] Litten ([id number]) -------------------------------------------------------------------------------- Progress Note Details Patient Name: CHERELL, COLVIN 12/16/2015 10:00 Date of Service: AM Medical Record [id number] Number: Patient Account Number: [id number] [birth year] (75 y.o. Treating RN: Clover Mealy, RN, BSN, [place] Sink Date of Birth/Sex: Female) Other Clinician: Primary Care Physician: [name] Treating Evlyn Kanner Referring Physician: [name] Physician/Extender: Weeks in Treatment: 5 Subjective Chief Complaint Information obtained from Patient Patient seen for complaints of Non-Healing Wound to the right lower extremity for about a month History of Present Illness (HPI) The following HPI elements were documented for the patient's wound: Location: Patient has an traumatic ulcer on her right leg Quality: Patient reports Tenderness to the affected area(s). Severity: Patient states wound are getting worse. Duration: Patient states the wound has been present for about 2 months prior to seeking help from the [facility] Timing: She verbalized constant pain with periods of intermittent uneasiness Context: The wound occurred when she hit her leg against an object Modifying Factors: patient had sutures applied on the initial encounter, was removed and wound dehisced Associated Signs and Symptoms: Patient reports presence of swelling This is a 75 year old African American female who presented today to the wound clinic for further evaluation of a non-healing ulcer which resulted form trauma. She is alert and oriented x 4, pleasant and seemingly good historian. Appears calm and no acute distress noted. Her history includes but not limited to hypertension, Angina Pectoris, CAD, fibromyalgia. Wears glasses, ambulates with a cane. Wound debrided during this visit, Will use aquacel ag and bordered foam dressing. Will see patient back on a weekly basis. Patient verbalized understanding of instructions. 11/18/2015 -- the patient does not have any major problems except for hypertension, coronary artery disease with cardiac cath done in November 2012 and a normal ejection fraction at 55%, iron deficiency anemia, chronic kidney disease stage II, venous stasis, fibromyalgia, and has had some left ankle surgery ( March this year) and in the remote past(2014) has had venous duplex studies done. was a DVT study and was negative for DVT. she has never  been a smoker. Objective HAIDY, KACKLEY ([id number]) Constitutional Pulse regular. Respirations normal and unlabored. Afebrile. Vitals Time Taken: 10:00 AM, Height: 67 in, Weight: 174 lbs, BMI: 27.2, Temperature: 98.1 F, Pulse: 61 bpm, Respiratory Rate: 18 breaths/min, Blood Pressure: 144/56 mmHg. Eyes Nonicteric. Reactive to light. Ears, Nose, Mouth, and Throat Lips, teeth, and gums WNL.Marland Kitchen Moist mucosa without lesions. Neck supple and nontender. No palpable supraclavicular or cervical adenopathy. Normal sized without goiter. Respiratory WNL. No retractions.. Cardiovascular Pedal Pulses WNL. No clubbing, cyanosis or edema. Lymphatic No adneopathy. No adenopathy. No adenopathy. Musculoskeletal Adexa without tenderness or enlargement.. Digits and nails w/o clubbing, cyanosis, infection, petechiae, ischemia, or inflammatory conditions.Marland Kitchen Psychiatric Judgement and insight Intact.. No evidence of depression, anxiety, or agitation.. General Notes: the patient required sharp debridement with #3 curet and subcut debris was removed and the wound underneath has healthy granulation tissue. Integumentary (Hair, Skin) No suspicious lesions. No crepitus or fluctuance. No peri-wound warmth or erythema. No masses.. Wound #1 status is Open. Original cause of wound was Trauma. The wound is located on the Right,Lateral Lower Leg. The wound measures 4cm length x 1cm width x 0.2cm depth; 3.142cm^2 area and 0.628cm^3 volume. The wound is limited to skin breakdown. There is no tunneling or undermining noted. There is a large  amount of serosanguineous drainage noted. The wound margin is distinct with the outline attached to the wound base. There is small (1-33%) pink, pale granulation within the wound bed. There is a small (1-33%) amount of necrotic tissue within the wound bed including Adherent Slough. The periwound skin appearance exhibited: Localized Edema, Moist. The periwound skin appearance did not  exhibit: Callus, Crepitus, Excoriation, Fluctuance, Friable, Induration, Rash, Scarring, Dry/Scaly, Maceration, Atrophie Blanche, Cyanosis, Ecchymosis, Hemosiderin Staining, Mottled, Pallor, Rubor, Erythema. Periwound temperature was noted as No Abnormality. The periwound has tenderness on palpation. SHAUNTAY, BRUNELLI ([id number]) Assessment Active Problems ICD-10 [phone] - Laceration without foreign body, right lower leg, initial encounter I89.0 - Lymphedema, not elsewhere classified L97.212 - Non-pressure chronic ulcer of right calf with fat layer exposed Procedures Wound #1 Wound #1 is a Dehisced Wound located on the Right,Lateral Lower Leg . There was a Skin/Subcutaneous Tissue Debridement ([id number]) debridement with total area of 4 sq cm performed by Evlyn Kanner, MD. with the following instrument(s): Curette to remove Non-Viable tissue/material including Exudate, Fibrin/Slough, and Subcutaneous after achieving pain control using Lidocaine 4% Topical Solution. A time out was conducted at 10:10, prior to the start of the procedure. A Minimum amount of bleeding was controlled with Pressure. The procedure was tolerated well with a pain level of 0 throughout and a pain level of 0 following the procedure. Post Debridement Measurements: 4cm length x 1cm width x 0.2cm depth; 0.628cm^3 volume. Character of Wound/Ulcer Post Debridement requires further debridement. Severity of Tissue Post Debridement is: Fat layer exposed. Post procedure Diagnosis Wound #1: Same as Pre-Procedure Plan Wound Cleansing: Wound #1 Right,Lateral Lower Leg: Clean wound with Normal Saline. Anesthetic: Wound #1 Right,Lateral Lower Leg: Topical Lidocaine 4% cream applied to wound bed prior to debridement Skin Barriers/Peri-Wound Care: Wound #1 Right,Lateral Lower Leg: Skin Prep Primary Wound Dressing: Wound #1 Right,Lateral Lower Leg: DAVID, TOWSON ([id number]) Aquacel Ag Secondary Dressing: Wound #1  Right,Lateral Lower Leg: Boardered Foam Dressing Dressing Change Frequency: Wound #1 Right,Lateral Lower Leg: Change dressing every other day. Follow-up Appointments: Wound #1 Right,Lateral Lower Leg: Return Appointment in 1 week. Edema Control: Wound #1 Right,Lateral Lower Leg: Elevate legs to the level of the heart and pump ankles as often as possible Support Garment 20-30 mm/Hg pressure to: Additional Orders / Instructions: Wound #1 Right,Lateral Lower Leg: Increase protein intake. Activity as tolerated Other: - Zinc, MVI, Vit A, C after review of wounds I have recommended: 1. Silver alginate to be applied daily with a bordered foam and then 2. Compression stockings of the 20-30 mm variety and I have discussed details of how this should be worn. She has not been very compliant with this 3. Regular visits to the wound center Electronic Signature(s) Signed: 12/16/2015 10:26:38 AM By: Evlyn Kanner MD, FACS Entered By: Evlyn Kanner on 12/16/2015 10:26:37 [name] Litten ([id number]) -------------------------------------------------------------------------------- SuperBill Details Patient Name: [name] Litten Date of Service: 12/16/2015 Medical Record Patient Account Number: [id number] Number: Afful, RN, BSN, Treating RN: [birth year] (75 y.o. [place] Sink Date of Birth/Sex: Female) Other Clinician: Primary Care Physician: [name] Treating Evlyn Kanner Referring Physician: [name] Physician/Extender: Tania Ade in Treatment: 5 Diagnosis Coding ICD-10 Codes Code Description [phone] Laceration without foreign body, right lower leg, initial encounter I89.0 Lymphedema, not elsewhere classified L97.212 Non-pressure chronic ulcer of right calf with fat layer exposed Facility Procedures CPT4 Code Description: [id number] 11042 - DEB SUBQ TISSUE 20 SQ CM/< ICD-10 Description Diagnosis S81.811A Laceration without foreign body, right lower leg, init L97.212 Non-pressure  chronic ulcer of  right calf with fat laye I89.0 Lymphedema, not elsewhere  classified Modifier: ial encounter r exposed Quantity: 1 Physician Procedures CPT4 Code Description: [id number] 11042 - WC PHYS SUBQ TISS 20 SQ CM ICD-10 Description Diagnosis S81.811A Laceration without foreign body, right lower leg, init L97.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

## 2015-12-23 ENCOUNTER — Ambulatory Visit: Payer: Medicare Other | Admitting: Surgery

## 2015-12-30 ENCOUNTER — Ambulatory Visit: Payer: Medicare Other | Admitting: Nurse Practitioner

## 2016-01-04 ENCOUNTER — Other Ambulatory Visit: Payer: Self-pay | Admitting: Cardiovascular Disease

## 2016-01-06 ENCOUNTER — Encounter: Payer: Medicare Other | Attending: Nurse Practitioner | Admitting: Nurse Practitioner

## 2016-01-06 DIAGNOSIS — M1990 Unspecified osteoarthritis, unspecified site: Secondary | ICD-10-CM | POA: Diagnosis not present

## 2016-01-06 DIAGNOSIS — X58XXXA Exposure to other specified factors, initial encounter: Secondary | ICD-10-CM | POA: Insufficient documentation

## 2016-01-06 DIAGNOSIS — D649 Anemia, unspecified: Secondary | ICD-10-CM | POA: Diagnosis not present

## 2016-01-06 DIAGNOSIS — L97212 Non-pressure chronic ulcer of right calf with fat layer exposed: Secondary | ICD-10-CM | POA: Insufficient documentation

## 2016-01-06 DIAGNOSIS — I890 Lymphedema, not elsewhere classified: Secondary | ICD-10-CM | POA: Insufficient documentation

## 2016-01-06 DIAGNOSIS — S81811A Laceration without foreign body, right lower leg, initial encounter: Secondary | ICD-10-CM | POA: Diagnosis not present

## 2016-01-06 DIAGNOSIS — I10 Essential (primary) hypertension: Secondary | ICD-10-CM | POA: Insufficient documentation

## 2016-01-06 NOTE — Progress Notes (Addendum)
[name], Cooper ([id number]) Visit Report for 01/06/2016 Arrival Information Details Patient Name: [name] Cooper, [name] Cooper Date of Service: 01/06/2016 10:00 AM Medical Record Number: [id number] Patient Account Number: [id number] Date of Birth/Sex: [birth year] (75 y.o. Female) Treating RN: Afful, RN, BSN, [place] Sink Primary Care Physician: [name] Other Clinician: Referring Physician: [name] Treating Physician/Extender: Rudene Re in Treatment: 8 Visit Information History Since Last Visit All ordered tests and consults were completed: No Patient Arrived: [name] Cooper Added or deleted any medications: No Arrival Time: 10:02 Any new allergies or adverse reactions: No Accompanied By: self Had a fall or experienced change in No Transfer Assistance: None activities of daily living that may affect Patient Identification Verified: Yes risk of falls: Secondary Verification Process Yes Signs or symptoms of abuse/neglect since last No Completed: visito Patient Requires Transmission-Based No Hospitalized since last visit: No Precautions: Has Dressing in Place as Prescribed: Yes Patient Has Alerts: Yes Pain Present Now: No Patient Alerts: Left ABI 1.13 Electronic Signature(s) Signed: 01/06/2016 10:03:21 AM By: Elpidio Eric BSN, RN Entered By: Elpidio Eric on 01/06/2016 10:03:21 [name] Cooper ([id number]) -------------------------------------------------------------------------------- Encounter Discharge Information Details Patient Name: [name] Cooper Date of Service: 01/06/2016 10:00 AM Medical Record Number: [id number] Patient Account Number: [id number] Date of Birth/Sex: 08/30/1940 (75 y.o. Female) Treating RN: Clover Mealy, RN, BSN, [place] Sink Primary Care Physician: [name] Other Clinician: Referring Physician: [name] Treating Physician/Extender: Eugene Garnet in Treatment: 8 Encounter Discharge Information Items Discharge Pain Level: 0 Discharge Condition:  Stable Ambulatory Status: Cane Discharge Destination: Home Transportation: Private Auto Accompanied By: self Schedule Follow-up Appointment: No Medication Reconciliation completed and provided to Patient/Care No [name] Cooper: Provided on Clinical Summary of Care: 01/06/2016 Form Type Recipient Paper Patient DB Electronic Signature(s) Signed: 01/06/2016 4:35:15 PM By: Elpidio Eric BSN, RN Previous Signature: 01/06/2016 10:32:55 AM Version By: Gwenlyn Perking Entered By: Elpidio Eric on 01/06/2016 10:34:06 [name] Cooper ([id number]) -------------------------------------------------------------------------------- Lower Extremity Assessment Details Patient Name: [name] Cooper Date of Service: 01/06/2016 10:00 AM Medical Record Number: [id number] Patient Account Number: [id number] Date of Birth/Sex: Apr 06, 1940 (75 y.o. Female) Treating RN: Afful, RN, BSN, [place] Sink Primary Care Physician: [name] Other Clinician: Referring Physician: [name] Treating Physician/Extender: Rudene Re in Treatment: 8 Edema Assessment Assessed: [name] Cooper: No] [Right: No] E[Left: dema] [Right: :] Calf Left: Right: Point of Measurement: 35 cm From Medial Instep cm 37.2 cm Ankle Left: Right: Point of Measurement: 9 cm From Medial Instep cm 23.3 cm Vascular Assessment Claudication: Claudication Assessment [Right:None] Pulses: Posterior Tibial Dorsalis Pedis Palpable: [Right:Yes] Extremity colors, hair growth, and conditions: Extremity Color: [Right:Mottled] Hair Growth on Extremity: [Right:No] Temperature of Extremity: [Right:Warm] Capillary Refill: [Right:< 3 seconds] Toe Nail Assessment Left: Right: Thick: Yes Discolored: Yes Deformed: No Improper Length and Hygiene: No Electronic Signature(s) Signed: 01/06/2016 4:35:15 PM By: Elpidio Eric BSN, RN Entered By: Elpidio Eric on 01/06/2016 10:07:43 [name] Cooper ([id number]) [name] Cooper, [name] Cooper  ([id number]) -------------------------------------------------------------------------------- Multi Wound Chart Details Patient Name: [name] Cooper Date of Service: 01/06/2016 10:00 AM Medical Record Number: [id number] Patient Account Number: [id number] Date of Birth/Sex: 12-Nov-1940 (75 y.o. Female) Treating RN: Clover Mealy, RN, BSN, [place] Sink Primary Care Physician: [name] Other Clinician: Referring Physician: [name] Treating Physician/Extender: Eugene Garnet in Treatment: 8 Vital Signs Height(in): 67 Pulse(bpm): 62 Weight(lbs): 174 Blood Pressure 143/58 (mmHg): Body Mass Index(BMI): 27 Temperature(F): 97.8 Respiratory Rate 18 (breaths/min): Photos: [1:No Photos] [N/A:N/A] Wound Location: [1:Right Lower Leg - Lateral] [N/A:N/A] Wounding Event: [1:Trauma] [N/A:N/A] Primary Etiology: [1:Dehisced Wound] [N/A:N/A] Secondary Etiology: [1:Lymphedema] [N/A:N/A] Comorbid History: [1:Anemia, Arrhythmia, Hypertension, Osteoarthritis] [  N/A:N/A] Date Acquired: [1:10/05/2015] [N/A:N/A] Weeks of Treatment: [1:8] [N/A:N/A] Wound Status: [1:Open] [N/A:N/A] Measurements L x W x D 2.7x0.5x0.2 [N/A:N/A] (cm) Area (cm) : [1:1.06] [N/A:N/A] Volume (cm) : [1:0.212] [N/A:N/A] % Reduction in Area: [1:91.00%] [N/A:N/A] % Reduction in Volume: 94.00% [N/A:N/A] Classification: [1:Full Thickness Without Exposed Support Structures] [N/A:N/A] Exudate Amount: [1:Medium] [N/A:N/A] Exudate Type: [1:Serous] [N/A:N/A] Exudate Color: [1:amber] [N/A:N/A] Wound Margin: [1:Distinct, outline attached] [N/A:N/A] Granulation Amount: [1:Large (67-100%)] [N/A:N/A] Granulation Quality: [1:Pink, Pale] [N/A:N/A] Necrotic Amount: [1:Small (1-33%)] [N/A:N/A] Exposed Structures: [1:Fascia: No Fat: No Tendon: No] [N/A:N/A] Muscle: No Joint: No Bone: No Limited to Skin Breakdown Epithelialization: Large (67-100%) N/A N/A Periwound Skin Texture: Edema: Yes N/A N/A Excoriation: No Induration:  No Callus: No Crepitus: No Fluctuance: No Friable: No Rash: No Scarring: No Periwound Skin Moist: Yes N/A N/A Moisture: Maceration: No Dry/Scaly: No Periwound Skin Color: Atrophie Blanche: No N/A N/A Cyanosis: No Ecchymosis: No Erythema: No Hemosiderin Staining: No Mottled: No Pallor: No Rubor: No Temperature: No Abnormality N/A N/A Tenderness on Yes N/A N/A Palpation: Wound Preparation: Ulcer Cleansing: N/A N/A Rinsed/Irrigated with Saline Topical Anesthetic Applied: Other: lidocaine 4% Treatment Notes Electronic Signature(s) Signed: 01/06/2016 4:35:15 PM By: Elpidio EricAfful, Rita BSN, RN Entered By: Elpidio EricAfful, Rita on 01/06/2016 10:25:30 [name] Cooper, [name] Cooper ([id number]) -------------------------------------------------------------------------------- Multi-Disciplinary Care Plan Details Patient Name: [name] Cooper, [name] Cooper Date of Service: 01/06/2016 10:00 AM Medical Record Number: [id number] Patient Account Number: [id number] Date of Birth/Sex: Jul 24, 1940 (75 y.o. Female) Treating RN: Afful, RN, BSN, [place] Sinkita Primary Care Physician: Jerl MinaHedrick, James Other Clinician: Referring Physician: Jerl MinaHedrick, James Treating Physician/Extender: Rudene ReBritto, Errol Weeks in Treatment: 8 Active Inactive Orientation to the Wound Care Program Nursing Diagnoses: Knowledge deficit related to the wound healing center program Goals: Patient/caregiver will verbalize understanding of the Wound Healing Center Program Date Initiated: 11/11/2015 Goal Status: Active Interventions: Provide education on orientation to the wound center Notes: Wound/Skin Impairment Nursing Diagnoses: Impaired tissue integrity Goals: Patient/caregiver will verbalize understanding of skin care regimen Date Initiated: 11/11/2015 Goal Status: Active Ulcer/skin breakdown will have a volume reduction of 30% by week 4 Date Initiated: 11/11/2015 Goal Status: Active Ulcer/skin breakdown will have a volume reduction of 50% by week 8 Date  Initiated: 11/11/2015 Goal Status: Active Ulcer/skin breakdown will have a volume reduction of 80% by week 12 Date Initiated: 11/11/2015 Goal Status: Active Ulcer/skin breakdown will heal within 14 weeks Date Initiated: 11/11/2015 Goal Status: Active [name] Cooper, [name] Cooper ([id number]) Interventions: Assess patient/caregiver ability to obtain necessary supplies Assess patient/caregiver ability to perform ulcer/skin care regimen upon admission and as needed Assess ulceration(s) every visit Notes: Electronic Signature(s) Signed: 01/06/2016 4:35:15 PM By: Elpidio EricAfful, Rita BSN, RN Entered By: Elpidio EricAfful, Rita on 01/06/2016 10:25:17 [name] Cooper, [name] Cooper ([id number]) -------------------------------------------------------------------------------- Pain Assessment Details Patient Name: [name] Cooper, [name] Cooper Date of Service: 01/06/2016 10:00 AM Medical Record Number: [id number] Patient Account Number: [id number] Date of Birth/Sex: Jul 24, 1940 (75 y.o. Female) Treating RN: Clover MealyAfful, RN, BSN, [place] Sinkita Primary Care Physician: Jerl MinaHedrick, James Other Clinician: Referring Physician: Jerl MinaHedrick, James Treating Physician/Extender: Rudene ReBritto, Errol Weeks in Treatment: 8 Active Problems Location of Pain Severity and Description of Pain Patient Has Paino No Site Locations With Dressing Change: No Pain Management and Medication Current Pain Management: Electronic Signature(s) Signed: 01/06/2016 4:35:15 PM By: Elpidio EricAfful, Rita BSN, RN Entered By: Elpidio EricAfful, Rita on 01/06/2016 10:06:05 [name] Cooper, [name] Cooper ([id number]) -------------------------------------------------------------------------------- Patient/Caregiver Education Details Patient Name: [name] Cooper, [name] Cooper Date of Service: 01/06/2016 10:00 AM Medical Record Number: [id number] Patient Account Number: [id number] Date of Birth/Gender: Jul 24, 1940 (75 y.o. Female) Treating RN: Afful, RN, BSN, [place] Sinkita Primary Care Physician: Jerl MinaHedrick, James  Other Clinician: Referring Physician: [name] Treating  Physician/Extender: Eugene Garnet in Treatment: 8 Education Assessment Education Provided To: Patient Education Topics Provided Welcome To The Wound [facility] Center: Methods: Explain/Verbal Responses: State content correctly Wound Debridement: Methods: Explain/Verbal Responses: State content correctly Electronic Signature(s) Signed: 01/06/2016 4:35:15 PM By: Elpidio Eric BSN, RN Entered By: Elpidio Eric on 01/06/2016 10:34:19 [name] Cooper ([id number]) -------------------------------------------------------------------------------- Wound Assessment Details Patient Name: [name] Cooper Date of Service: 01/06/2016 10:00 AM Medical Record Number: [id number] Patient Account Number: [id number] Date of Birth/Sex: Jul 08, 1940 (75 y.o. Female) Treating RN: Afful, RN, BSN, Rita Primary Care Physician: [name] Other Clinician: Referring Physician: [name] Treating Physician/Extender: Rudene Re in Treatment: 8 Wound Status Wound Number: 1 Primary Dehisced Wound Etiology: Wound Location: Right Lower Leg - Lateral Secondary Lymphedema Wounding Event: Trauma Etiology: Date Acquired: 10/05/2015 Wound Status: Open Weeks Of Treatment: 8 Comorbid Anemia, Arrhythmia, Hypertension, Clustered Wound: No History: Osteoarthritis Photos Photo Uploaded By: Elpidio Eric on 01/06/2016 15:34:03 Wound Measurements Length: (cm) 2.7 Width: (cm) 0.5 Depth: (cm) 0.2 Area: (cm) 1.06 Volume: (cm) 0.212 % Reduction in Area: 91% % Reduction in Volume: 94% Epithelialization: Large (67-100%) Tunneling: No Undermining: No Wound Description Full Thickness Without Exposed Classification: Support Structures Wound Margin: Distinct, outline attached Exudate Medium Amount: Exudate Type: Serous Exudate Color: amber Foul Odor After Cleansing: No Wound Bed Granulation Amount: Large (67-100%) Exposed Structure Granulation Quality: Pink, Pale Fascia Exposed: No Necrotic  Amount: Small (1-33%) Fat Layer Exposed: No Necrotic Quality: Adherent Slough Tendon Exposed: No [name] Cooper, [name] Cooper ([id number]) Muscle Exposed: No Joint Exposed: No Bone Exposed: No Limited to Skin Breakdown Periwound Skin Texture Texture Color No Abnormalities Noted: No No Abnormalities Noted: No Callus: No Atrophie Blanche: No Crepitus: No Cyanosis: No Excoriation: No Ecchymosis: No Fluctuance: No Erythema: No Friable: No Hemosiderin Staining: No Induration: No Mottled: No Localized Edema: Yes Pallor: No Rash: No Rubor: No Scarring: No Temperature / Pain Moisture Temperature: No Abnormality No Abnormalities Noted: No Tenderness on Palpation: Yes Dry / Scaly: No Maceration: No Moist: Yes Wound Preparation Ulcer Cleansing: Rinsed/Irrigated with Saline Topical Anesthetic Applied: Other: lidocaine 4%, Treatment Notes Wound #1 (Right, Lateral Lower Leg) 1. Cleansed with: Clean wound with Normal Saline 3. Peri-wound Care: Skin Prep 4. Dressing Applied: Prisma Ag 5. Secondary Dressing Applied Bordered Foam Dressing Electronic Signature(s) Signed: 01/06/2016 4:35:15 PM By: Elpidio Eric BSN, RN Entered By: Elpidio Eric on 01/06/2016 10:14:11 [name] Cooper ([id number]) -------------------------------------------------------------------------------- Vitals Details Patient Name: [name] Cooper Date of Service: 01/06/2016 10:00 AM Medical Record Number: [id number] Patient Account Number: [id number] Date of Birth/Sex: 1940/07/12 (75 y.o. Female) Treating RN: Afful, RN, BSN, Rita Primary Care Physician: [name] Other Clinician: Referring Physician: [name] Treating Physician/Extender: Rudene Re in Treatment: 8 Vital Signs Time Taken: 10:07 Temperature (F): 97.8 Height (in): 67 Pulse (bpm): 62 Weight (lbs): 174 Respiratory Rate (breaths/min): 18 Body Mass Index (BMI): 27.2 Blood Pressure (mmHg): 143/58 Reference Range: 80 - 120 mg /  dl Electronic Signature(s) Signed: 01/06/2016 4:35:15 PM By: Elpidio Eric BSN, RN Entered By: Elpidio Eric on 01/06/2016 10:08:16

## 2016-01-07 NOTE — Progress Notes (Signed)
[name] Cooper, [name] Cooper ([id number]) Visit Report for 01/06/2016 Chief Complaint Document Details Patient Name: [name] Cooper, [name] Cooper 01/06/2016 10:00 Date of Service: AM Medical Record [id number] Number: Patient Account Number: [id number] 1940/04/15 (75 y.o. Treating RN: Clover MealyAfful, RN, BSN, [place] S[place] Date of Birth/Sex: Female) Other Clinician: Primary Care Physician: Burnice LoganHedrick, James Treating Saunders, Sharon Referring Physician: Jerl MinaHedrick, James Physician/Extender: Tania AdeWeeks in Treatment: 8 Information Obtained from: Patient Chief Complaint Patient seen for complaints of Non-Healing Wound to the right lower extremity for about a month Electronic Signature(s) Signed: 01/06/2016 5:31:30 PM By: Georges LynchSaunders, Sharon FNP Entered By: Georges LynchSaunders, Sharon on 01/06/2016 10:36:43 [name] Cooper, [name] Cooper ([id number]) -------------------------------------------------------------------------------- Debridement Details Patient Name: [name] Cooper, [name] Cooper 01/06/2016 10:00 Date of Service: AM Medical Record [id number] Number: Patient Account Number: [id number] 1940/04/15 (75 y.o. Treating RN: Afful, RN, BSN, [place] Sinkita Date of Birth/Sex: Female) Other Clinician: Primary Care Physician: Burnice LoganHedrick, James Treating Saunders, Sharon Referring Physician: Jerl MinaHedrick, James Physician/Extender: Tania AdeWeeks in Treatment: 8 Debridement Performed for Wound #1 Right,Lateral Lower Leg Assessment: Performed By: Physician Georges LynchSaunders, Sharon, NP Debridement: Debridement Pre-procedure Yes - 10:26 Verification/Time Out Taken: Start Time: 10:26 Pain Control: Lidocaine 4% Topical Solution Level: Skin/Subcutaneous Tissue Total Area Debrided (L x 2.7 (cm) x 0.5 (cm) = 1.35 (cm) W): Tissue and other Non-Viable, Fibrin/Slough, Subcutaneous material debrided: Instrument: Blade Bleeding: None Hemostasis Achieved: Pressure End Time: 10:28 Procedural Pain: 0 Post Procedural Pain: 0 Response to Treatment: Procedure was tolerated well Post Debridement Measurements of  Total Wound Length: (cm) 2.7 Width: (cm) 0.5 Depth: (cm) 0.2 Volume: (cm) 0.212 Character of Wound/Ulcer Post Improved Debridement: Severity of Tissue Post Debridement: Fat layer exposed Post Procedure Diagnosis Same as Pre-procedure Electronic Signature(s) Signed: 01/06/2016 4:35:15 PM By: Elpidio EricAfful, Rita BSN, RN Signed: 01/06/2016 5:31:30 PM By: Georges LynchSaunders, Sharon FNP [place], [place] ([id number]) Entered By: Elpidio EricAfful, Rita on 01/06/2016 10:28:08 [name] Cooper, [name] Cooper ([id number]) -------------------------------------------------------------------------------- HPI Details Patient Name: [name] Cooper, [name] Cooper 01/06/2016 10:00 Date of Service: AM Medical Record [id number] Number: Patient Account Number: [id number] 1940/04/15 (75 y.o. Treating RN: Clover MealyAfful, RN, BSN, [place] Sinkita Date of Birth/Sex: Female) Other Clinician: Primary Care Physician: Burnice LoganHedrick, James Treating Saunders, Sharon Referring Physician: Jerl MinaHedrick, James Physician/Extender: Tania AdeWeeks in Treatment: 8 History of Present Illness Location: Patient has an traumatic ulcer on her right leg Quality: Patient reports Tenderness to the affected area(s). Severity: Patient states wound are getting worse. Duration: Patient states the wound has been present for about 2 months prior [facility] Timing: She verbalized constant pain with periods of intermittent uneasiness Context: The wound occurred when she hit her leg against an object Modifying Factors: patient had sutures applied on the initial encounter, was removed and wound dehisced Associated Signs and Symptoms: Patient reports presence of swelling HPI Description: This is a 75 year old African American female who presented today to the wound clinic for further evaluation of a non-healing ulcer which resulted form trauma. She is alert and oriented x 4, pleasant and seemingly good historian. Appears calm and no acute distress noted. Her history includes but not limited to hypertension,  Angina Pectoris, CAD, fibromyalgia. Wears glasses, ambulates with a cane. Wound debrided during this visit, Will use aquacel ag and bordered foam dressing. Will see patient back on a weekly basis. Patient verbalized understanding of instructions. 11/18/2015 -- the patient does not have any major problems except for hypertension, coronary artery disease with cardiac cath done in November 2012 and a normal ejection fraction at 55%, iron deficiency anemia, chronic kidney disease stage II, venous stasis, fibromyalgia, and has had some left ankle surgery ( March this  year) and in the remote past(2014) has had venous duplex studies done. was a DVT study and was negative for DVT. she has never been a smoker. 01/06/16: pt returns today after a several week absence. amazingly, her wound is improving with good epithelialization. she denies systemic s/s of infection. no new wounds or skin breakdown. no interval changes regarding health status. Electronic Signature(s) Signed: 01/06/2016 5:31:30 PM By: Georges Lynch FNP Entered By: Georges Lynch on 01/06/2016 10:37:49 [name] Litten ([id number]) -------------------------------------------------------------------------------- Physical Exam Details Patient Name: [name] Cooper, [name] Cooper 01/06/2016 10:00 Date of Service: AM Medical Record [id number] Number: Patient Account Number: [id number] 01-Oct-1940 (75 y.o. Treating RN: Clover Mealy, RN, BSN, [place] Sink Date of Birth/Sex: Female) Other Clinician: Primary Care Physician: Burnice Logan Referring Physician: [name] Physician/Extender: Tania Ade in Treatment: 8 Constitutional Patient's appearance is neat and clean. Appears in no acute distress. Well nourished and well developed.. Ears, Nose, Mouth, and Throat Patient can hear normal speaking tones without difficulty.Marland Kitchen Respiratory Respiratory effort is easy and symmetric bilaterally. Rate is normal at rest and on room  air.. Cardiovascular Pedal pulses palpable and strong bilaterally.. Extremities are free of varicosities, clubbing or edema. Peripheral pulses strong and equal. Capillary refill < 3 seconds.. Psychiatric Judgement and insight intact.. Alert and oriented times 3.. Short and long term memory intact.. No evidence of depression, anxiety, or agitation. Calm, cooperative, and communicative. Appropriate interactions and affect.. Electronic Signature(s) Signed: 01/06/2016 5:31:30 PM By: Georges Lynch FNP Entered By: Georges Lynch on 01/06/2016 10:38:11 [name] Litten ([id number]) -------------------------------------------------------------------------------- Physician Orders Details Patient Name: TAMIRA, RYLAND 01/06/2016 10:00 Date of Service: AM Medical Record [id number] Number: Patient Account Number: [id number] 1940/12/27 (75 y.o. Treating RN: Clover Mealy, RN, BSN, [place] Sink Date of Birth/Sex: Female) Other Clinician: Primary Care Physician: Burnice Logan Referring Physician: [name] Physician/Extender: Tania Ade in Treatment: 8 Verbal / Phone Orders: Yes Clinician: Afful, RN, BSN, Rita Read Back and Verified: Yes Diagnosis Coding Wound Cleansing Wound #1 Right,Lateral Lower Leg o Clean wound with Normal Saline. Anesthetic Wound #1 Right,Lateral Lower Leg o Topical Lidocaine 4% cream applied to wound bed prior to debridement Skin Barriers/Peri-Wound Care Wound #1 Right,Lateral Lower Leg o Skin Prep Primary Wound Dressing Wound #1 Right,Lateral Lower Leg o Prisma Ag Secondary Dressing Wound #1 Right,Lateral Lower Leg o Boardered Foam Dressing Dressing Change Frequency Wound #1 Right,Lateral Lower Leg o Change dressing every other day. Follow-up Appointments Wound #1 Right,Lateral Lower Leg o Return Appointment in 1 week. Edema Control Wound #1 Right,Lateral Lower Leg o Elevate legs to the level of the heart and pump ankles  as often as possible o Support Garment 20-30 mm/Hg pressure to: [place], Eather Colas ([id number]) Additional Orders / Instructions Wound #1 Right,Lateral Lower Leg o Increase protein intake. o Activity as tolerated o Other: - Zinc, MVI, Vit A, C Electronic Signature(s) Signed: 01/06/2016 4:35:15 PM By: Elpidio Eric BSN, RN Signed: 01/06/2016 5:31:30 PM By: Georges Lynch FNP Entered By: Elpidio Eric on 01/06/2016 10:28:45 [name] Litten ([id number]) -------------------------------------------------------------------------------- Problem List Details Patient Name: BANNIE, LOBBAN 01/06/2016 10:00 Date of Service: AM Medical Record [id number] Number: Patient Account Number: [id number] [birth year] (75 y.o. 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 Treating RN: Clover Mealy, RN, BSN, [place] Sink Date of Birth/Sex: Female) Other Clinician: Primary Care Physician: Burnice Logan Referring Physician: [name] Physician/Extender: Tania Ade in Treatment: 8 Subjective Chief Complaint Information obtained from Patient Patient seen for complaints of Non-Healing Wound to the right lower extremity for about a month History of Present Illness (HPI) The following HPI elements were documented for the patient's wound: Location: Patient has an traumatic ulcer on her right leg Quality: Patient reports Tenderness to the affected area(s). Severity: Patient states wound are getting worse. Duration: Patient states the wound has been present for about 2 months prior to seeking help from the [facility] Timing: She verbalized constant pain with periods of intermittent uneasiness Context: The wound occurred when she hit her leg against an object Modifying Factors: patient had sutures applied on the initial encounter, was removed and wound dehisced Associated Signs and Symptoms: Patient reports presence of swelling This is a 75 year old African American female who presented today to the wound clinic for further evaluation of a non-healing ulcer which resulted form trauma. She is alert and oriented x 4, pleasant and seemingly good historian. Appears calm and no acute distress noted. Her history includes but not limited to hypertension, Angina Pectoris, CAD, fibromyalgia. Wears glasses, ambulates with a cane. Wound debrided during this visit, Will use aquacel ag and bordered foam dressing. Will see patient back on a weekly basis. Patient verbalized understanding of instructions. 11/18/2015 -- the patient does not have any major problems except for hypertension, coronary artery disease with cardiac cath done in November 2012 and a normal ejection fraction at 55%, iron deficiency anemia, chronic kidney disease stage II, venous stasis, fibromyalgia, and has had some left ankle surgery ( March this year) and in the remote past(2014) has had venous duplex studies done. was a DVT study and was negative for DVT. she has never  been a smoker. 01/06/16: pt returns today after a several week absence. amazingly, her wound is improving with good epithelialization. she denies systemic s/s of infection. no new wounds or skin breakdown. no interval changes regarding health status. PENNYE, BEEGHLY ([id number]) Objective Constitutional Patient's appearance is neat and clean. Appears in no acute distress. Well nourished and well developed.. Vitals Time Taken: 10:07 AM, Height: 67 in, Weight: 174 lbs, BMI: 27.2, Temperature: 97.8 F, Pulse: 62 bpm, Respiratory Rate: 18 breaths/min, Blood Pressure: 143/58 mmHg. Ears, Nose, Mouth, and Throat Patient can hear normal speaking tones without difficulty.Marland Kitchen Respiratory Respiratory effort is easy and symmetric bilaterally. Rate is normal at rest and on room air.. Cardiovascular Pedal pulses palpable and strong bilaterally.. Extremities are free of varicosities, clubbing or edema. Peripheral pulses strong and equal. Capillary refill < 3 seconds.. Psychiatric Judgement and insight intact.. Alert and oriented times 3.. Short and long term memory intact.. No evidence of depression, anxiety, or agitation. Calm, cooperative, and communicative. Appropriate interactions and affect.. Integumentary (Hair, Skin) Wound #1 status is Open. Original cause of wound was Trauma. The wound is located on the Right,Lateral Lower Leg. The wound measures 2.7cm length x 0.5cm width x 0.2cm depth; 1.06cm^2 area and 0.212cm^3 volume. The wound is limited to skin breakdown. There is no tunneling  or undermining noted. There is a medium amount of serous drainage noted. The wound margin is distinct with the outline attached to the wound base. There is large (67-100%) pink, pale granulation within the wound bed. There is a small (1-33%) amount of necrotic tissue within the wound bed including Adherent Slough. The periwound skin appearance exhibited: Localized Edema, Moist. The periwound skin appearance did not  exhibit: Callus, Crepitus, Excoriation, Fluctuance, Friable, Induration, Rash, Scarring, Dry/Scaly, Maceration, Atrophie Blanche, Cyanosis, Ecchymosis, Hemosiderin Staining, Mottled, Pallor, Rubor, Erythema. Periwound temperature was noted as No Abnormality. The periwound has tenderness on palpation. Assessment Active Problems ICD-10 S81.811A - Laceration without foreign body, right lower leg, initial encounter ARIELLE, EBER ([id number]) I89.0 - Lymphedema, not elsewhere classified L97.212 - Non-pressure chronic ulcer of right calf with fat layer exposed Procedures Wound #1 Wound #1 is a Dehisced Wound located on the Right,Lateral Lower Leg . There was a Skin/Subcutaneous Tissue Debridement ([id number]) debridement with total area of 1.35 sq cm performed by Georges Lynch, NP. with the following instrument(s): Blade to remove Non-Viable tissue/material including Fibrin/Slough and Subcutaneous after achieving pain control using Lidocaine 4% Topical Solution. A time out was conducted at 10:26, prior to the start of the procedure. There was no bleeding. The procedure was tolerated well with a pain level of 0 throughout and a pain level of 0 following the procedure. Post Debridement Measurements: 2.7cm length x 0.5cm width x 0.2cm depth; 0.212cm^3 volume. Character of Wound/Ulcer Post Debridement is improved. Severity of Tissue Post Debridement is: Fat layer exposed. Post procedure Diagnosis Wound #1: Same as Pre-Procedure Plan Wound Cleansing: Wound #1 Right,Lateral Lower Leg: Clean wound with Normal Saline. Anesthetic: Wound #1 Right,Lateral Lower Leg: Topical Lidocaine 4% cream applied to wound bed prior to debridement Skin Barriers/Peri-Wound Care: Wound #1 Right,Lateral Lower Leg: Skin Prep Primary Wound Dressing: Wound #1 Right,Lateral Lower Leg: Prisma Ag Secondary Dressing: Wound #1 Right,Lateral Lower Leg: Boardered Foam Dressing Dressing Change Frequency: Wound #1  Right,Lateral Lower Leg: Change dressing every other day. Follow-up Appointments: Wound #1 Right,Lateral Lower Leg: Return Appointment in 1 week. SHALLYN, CONSTANCIO ([id number]) Edema Control: Wound #1 Right,Lateral Lower Leg: Elevate legs to the level of the heart and pump ankles as often as possible Support Garment 20-30 mm/Hg pressure to: Additional Orders / Instructions: Wound #1 Right,Lateral Lower Leg: Increase protein intake. Activity as tolerated Other: - Zinc, MVI, Vit A, C Follow-Up Appointments: A Patient Clinical Summary of Care was provided to DB 1. discussed clinical findings and implications with pt. all questions were answered. 2. we will d/c previous dressing orders. Initiate Prisma. see orders above. Electronic Signature(s) Signed: 01/06/2016 5:31:30 PM By: Georges Lynch FNP Entered By: Georges Lynch on 01/06/2016 10:39:32 [name] Litten ([id number]) -------------------------------------------------------------------------------- SuperBill Details Patient Name: [name] Litten Date of Service: 01/06/2016 Medical Record Patient Account Number: [id number] Number: Afful, RN, BSN, Treating RN: 1941/03/24 (75 y.o. [place] Sink Date of Birth/Sex: Female) Other Clinician: Primary Care Physician: Burnice Logan Referring Physician: [name] Physician/Extender: Tania Ade in Treatment: 8 Diagnosis Coding ICD-10 Codes Code Description [phone] Laceration without foreign body, right lower leg, initial encounter I89.0 Lymphedema, not elsewhere classified L97.212 Non-pressure chronic ulcer of right calf with fat layer exposed Facility Procedures CPT4 Code Description: [id number] 11042 - DEB SUBQ TISSUE 20 SQ CM/< ICD-10 Description Diagnosis S81.811A Laceration without foreign body, right lower leg, init L97.212 Non-pressure chronic ulcer of right calf with fat laye Modifier: ial encounte r exposed Quantity: 1 r Physician  Procedures CPT4 Code Description: [id number]  11042 - WC PHYS SUBQ TISS 20 SQ CM ICD-10 Description Diagnosis S81.811A Laceration without foreign body, right lower leg, init L97.212 Non-pressure chronic ulcer of right calf with fat laye Modifier: ial encounte r exposed Quantity: 1 r Electronic Signature(s) Signed: 01/06/2016 5:31:30 PM By: Georges Lynch FNP Entered By: Georges Lynch on 01/06/2016 10:39:48

## 2016-01-18 ENCOUNTER — Other Ambulatory Visit: Payer: Self-pay

## 2016-01-18 ENCOUNTER — Inpatient Hospital Stay: Payer: Medicare Other

## 2016-01-18 ENCOUNTER — Inpatient Hospital Stay: Payer: Medicare Other | Attending: Internal Medicine | Admitting: Internal Medicine

## 2016-01-18 VITALS — BP 134/76 | HR 61 | Temp 97.2°F | Ht 67.0 in | Wt 179.3 lb

## 2016-01-18 DIAGNOSIS — N183 Chronic kidney disease, stage 3 unspecified: Secondary | ICD-10-CM

## 2016-01-18 DIAGNOSIS — Z7982 Long term (current) use of aspirin: Secondary | ICD-10-CM | POA: Diagnosis not present

## 2016-01-18 DIAGNOSIS — Z8614 Personal history of Methicillin resistant Staphylococcus aureus infection: Secondary | ICD-10-CM | POA: Insufficient documentation

## 2016-01-18 DIAGNOSIS — E785 Hyperlipidemia, unspecified: Secondary | ICD-10-CM | POA: Insufficient documentation

## 2016-01-18 DIAGNOSIS — M1990 Unspecified osteoarthritis, unspecified site: Secondary | ICD-10-CM | POA: Insufficient documentation

## 2016-01-18 DIAGNOSIS — I2510 Atherosclerotic heart disease of native coronary artery without angina pectoris: Secondary | ICD-10-CM | POA: Diagnosis not present

## 2016-01-18 DIAGNOSIS — R700 Elevated erythrocyte sedimentation rate: Secondary | ICD-10-CM | POA: Diagnosis not present

## 2016-01-18 DIAGNOSIS — I129 Hypertensive chronic kidney disease with stage 1 through stage 4 chronic kidney disease, or unspecified chronic kidney disease: Secondary | ICD-10-CM | POA: Diagnosis not present

## 2016-01-18 DIAGNOSIS — M797 Fibromyalgia: Secondary | ICD-10-CM | POA: Diagnosis not present

## 2016-01-18 DIAGNOSIS — D631 Anemia in chronic kidney disease: Secondary | ICD-10-CM

## 2016-01-18 DIAGNOSIS — Z79899 Other long term (current) drug therapy: Secondary | ICD-10-CM | POA: Diagnosis not present

## 2016-01-18 LAB — CBC WITH DIFFERENTIAL/PLATELET
BASOS ABS: 0 10*3/uL (ref 0–0.1)
BASOS PCT: 1 %
EOS ABS: 0.2 10*3/uL (ref 0–0.7)
Eosinophils Relative: 3 %
HEMATOCRIT: 30.6 % — AB (ref 35.0–47.0)
HEMOGLOBIN: 10.1 g/dL — AB (ref 12.0–16.0)
Lymphocytes Relative: 24 %
Lymphs Abs: 2.2 10*3/uL (ref 1.0–3.6)
MCH: 27 pg (ref 26.0–34.0)
MCHC: 33.1 g/dL (ref 32.0–36.0)
MCV: 81.7 fL (ref 80.0–100.0)
MONO ABS: 0.7 10*3/uL (ref 0.2–0.9)
MONOS PCT: 7 %
NEUTROS ABS: 6.1 10*3/uL (ref 1.4–6.5)
NEUTROS PCT: 65 %
Platelets: 297 10*3/uL (ref 150–440)
RBC: 3.74 MIL/uL — ABNORMAL LOW (ref 3.80–5.20)
RDW: 13.7 % (ref 11.5–14.5)
WBC: 9.2 10*3/uL (ref 3.6–11.0)

## 2016-01-18 LAB — IRON AND TIBC
Iron: 45 ug/dL (ref 28–170)
SATURATION RATIOS: 20 % (ref 10.4–31.8)
TIBC: 224 ug/dL — ABNORMAL LOW (ref 250–450)
UIBC: 179 ug/dL

## 2016-01-18 LAB — RETICULOCYTES
RBC.: 3.77 MIL/uL — AB (ref 3.80–5.20)
RETIC COUNT ABSOLUTE: 30.2 10*3/uL (ref 19.0–183.0)
RETIC CT PCT: 0.8 % (ref 0.4–3.1)

## 2016-01-18 LAB — FERRITIN: FERRITIN: 399 ng/mL — AB (ref 11–307)

## 2016-01-18 NOTE — Progress Notes (Signed)
Patient here for referral for anemia. VS and WNL Patient complains of being cold "all the time".

## 2016-01-19 LAB — ERYTHROPOIETIN: ERYTHROPOIETIN: 9 m[IU]/mL (ref 2.6–18.5)

## 2016-01-20 ENCOUNTER — Encounter: Payer: Medicare Other | Admitting: Surgery

## 2016-01-20 DIAGNOSIS — S81811A Laceration without foreign body, right lower leg, initial encounter: Secondary | ICD-10-CM | POA: Diagnosis not present

## 2016-01-20 LAB — MULTIPLE MYELOMA PANEL, SERUM
ALBUMIN SERPL ELPH-MCNC: 3.4 g/dL (ref 2.9–4.4)
ALBUMIN/GLOB SERPL: 0.9 (ref 0.7–1.7)
Alpha 1: 0.3 g/dL (ref 0.0–0.4)
Alpha2 Glob SerPl Elph-Mcnc: 0.8 g/dL (ref 0.4–1.0)
B-GLOBULIN SERPL ELPH-MCNC: 1.5 g/dL — AB (ref 0.7–1.3)
GAMMA GLOB SERPL ELPH-MCNC: 1.4 g/dL (ref 0.4–1.8)
GLOBULIN, TOTAL: 4 g/dL — AB (ref 2.2–3.9)
IgA: 506 mg/dL — ABNORMAL HIGH (ref 64–422)
IgG (Immunoglobin G), Serum: 1447 mg/dL (ref 700–1600)
IgM, Serum: 70 mg/dL (ref 26–217)
Total Protein ELP: 7.4 g/dL (ref 6.0–8.5)

## 2016-01-21 NOTE — Progress Notes (Signed)
[name] Cooper ([id number]) Visit Report for 01/20/2016 Chief Complaint Document Details Patient Name: [name] Cooper, [name] Cooper 01/20/2016 10:00 Date of Service: AM Medical Record [id number] Number: Patient Account Number: [id number] 06-22-1940 (75 y.o. Treating RN: Clover Mealy, RN, BSN, [place] Sink Date of Birth/Sex: Female) Other Clinician: Primary Care Physician: [name] Treating Evlyn Kanner Referring Physician: [name] Physician/Extender: Weeks in Treatment: 10 Information Obtained from: Patient Chief Complaint Patient seen for complaints of Non-Healing Wound to the right lower extremity for about a month Electronic Signature(s) Signed: 01/20/2016 10:23:31 AM By: Evlyn Kanner MD, FACS Entered By: Evlyn Kanner on 01/20/2016 10:23:29 [name] Litten ([id number]) -------------------------------------------------------------------------------- Debridement Details Patient Name: [name] Cooper 01/20/2016 10:00 Date of Service: AM Medical Record [id number] Number: Patient Account Number: [id number] [birth year] (75 y.o. Treating RN: Afful, RN, BSN, [place] Sink Date of Birth/Sex: Female) Other Clinician: Primary Care Physician: [name] Treating Evlyn Kanner Referring Physician: [name] Physician/Extender: Weeks in Treatment: 10 Debridement Performed for Wound #1 Right,Lateral Lower Leg Assessment: Performed By: Physician Evlyn Kanner, MD Debridement: Debridement Pre-procedure Yes - 10:17 Verification/Time Out Taken: Start Time: 10:17 Pain Control: Lidocaine 4% Topical Solution Level: Skin/Subcutaneous Tissue Total Area Debrided (L x 0.3 (cm) x 0.3 (cm) = 0.09 (cm) W): Tissue and other Non-Viable, Fibrin/Slough, Subcutaneous, Tendon material debrided: Instrument: Forceps Bleeding: Minimum Hemostasis Achieved: Pressure End Time: 10:19 Procedural Pain: 0 Post Procedural Pain: 0 Response to Treatment: Procedure was tolerated well Post Debridement Measurements  of Total Wound Length: (cm) 0.3 Width: (cm) 0.3 Depth: (cm) 0.3 Volume: (cm) 0.021 Character of Wound/Ulcer Post Stable Debridement: Severity of Tissue Post Debridement: Fat layer exposed Post Procedure Diagnosis Same as Pre-procedure Electronic Signature(s) Signed: 01/20/2016 10:23:22 AM By: Evlyn Kanner MD, FACS Signed: 01/20/2016 5:11:59 PM By: Elpidio Eric BSN, RN [place], Seraiah ([id number]) Entered By: Evlyn Kanner on 01/20/2016 10:23:21 JAYDAN, CHRETIEN ([id number]) -------------------------------------------------------------------------------- HPI Details Patient Name: [name] Cooper 01/20/2016 10:00 Date of Service: AM Medical Record [id number] Number: Patient Account Number: [id number] 1941-01-22 (75 y.o. Treating RN: Clover Mealy, RN, BSN, [place] Sink Date of Birth/Sex: Female) Other Clinician: Primary Care Physician: [name] Treating Evlyn Kanner Referring Physician: [name] Physician/Extender: Weeks in Treatment: 10 History of Present Illness Location: Patient has an traumatic ulcer on her right leg Quality: Patient reports Tenderness to the affected area(s). Severity: Patient states wound are getting worse. Duration: Patient states the wound has been present for about 2 months prior to seeking help from the [facility] Timing: She verbalized constant pain with periods of intermittent uneasiness Context: The wound occurred when she hit her leg against an object Modifying Factors: patient had sutures applied on the initial encounter, was removed and wound dehisced Associated Signs and Symptoms: Patient reports presence of swelling HPI Description: This is a 75 year old African American female who presented today to the wound clinic for further evaluation of a non-healing ulcer which resulted form trauma. She is alert and oriented x 4, pleasant and seemingly good historian. Appears calm and no acute distress noted. Her history includes but not limited to  hypertension, Angina Pectoris, CAD, fibromyalgia. Wears glasses, ambulates with a cane. Wound debrided during this visit, Will use aquacel ag and bordered foam dressing. Will see patient back on a weekly basis. Patient verbalized understanding of instructions. 11/18/2015 -- the patient does not have any major problems except for hypertension, coronary artery disease with cardiac cath done in November 2012 and a normal ejection fraction at 55%, iron deficiency anemia, chronic kidney disease stage II, venous stasis, fibromyalgia, and has had some left ankle surgery (  March this year) and in the remote past(2014) has had venous duplex studies done. was a DVT study and was negative for DVT. she has never been a smoker. 01/06/16: pt returns today after a several week absence. amazingly, her wound is improving with good epithelialization. she denies systemic s/s of infection. no new wounds or skin breakdown. no interval changes regarding health status. Electronic Signature(s) Signed: 01/20/2016 10:23:40 AM By: Evlyn KannerBritto, Quinton Voth MD, FACS Entered By: Evlyn KannerBritto, Andora Krull on 01/20/2016 10:23:39 [name] LittenBURRELL, Keir ([id number]) -------------------------------------------------------------------------------- Physical Exam Details Patient Name: [name] Cooper 01/20/2016 10:00 Date of Service: AM Medical Record [id number] Number: Patient Account Number: [id number] 03/08/1941 (75 y.o. Treating RN: Clover MealyAfful, RN, BSN, [place] Sinkita Date of Birth/Sex: Female) Other Clinician: Primary Care Physician: Lavena BullionHedrick, James Treating Kalisi Bevill Referring Physician: Jerl MinaHedrick, James Physician/Extender: Weeks in Treatment: 10 Constitutional . Pulse regular. Respirations normal and unlabored. Afebrile. . Eyes Nonicteric. Reactive to light. Ears, Nose, Mouth, and Throat Lips, teeth, and gums WNL.Marland Kitchen. Moist mucosa without lesions. Neck supple and nontender. No palpable supraclavicular or cervical adenopathy. Normal sized without  goiter. Respiratory WNL. No retractions.. Cardiovascular Pedal Pulses WNL. No clubbing, cyanosis or edema. Lymphatic No adneopathy. No adenopathy. No adenopathy. Musculoskeletal Adexa without tenderness or enlargement.. Digits and nails w/o clubbing, cyanosis, infection, petechiae, ischemia, or inflammatory conditions.. Integumentary (Hair, Skin) No suspicious lesions. No crepitus or fluctuance. No peri-wound warmth or erythema. No masses.Marland Kitchen. Psychiatric Judgement and insight Intact.. No evidence of depression, anxiety, or agitation.. Notes most of the wound has healed except for a small area at the most inferior part of the triangle where there is a 0.3 x 0.3 x 0.3 cm wound which has subcutaneous debris and I have sharply remove this with a toothed forcep and irrigated it well. Electronic Signature(s) Signed: 01/20/2016 10:24:32 AM By: Evlyn KannerBritto, Daysha Ashmore MD, FACS Entered By: Evlyn KannerBritto, Annaleah Arata on 01/20/2016 10:24:30 [name] LittenBURRELL, Marysue ([id number]) -------------------------------------------------------------------------------- Physician Orders Details Patient Name: [name] LittenBURRELL, Jacklyne 01/20/2016 10:00 Date of Service: AM Medical Record [id number] Number: Patient Account Number: [id number] 03/08/1941 (75 y.o. Treating RN: Clover MealyAfful, RN, BSN, [place] Sinkita Date of Birth/Sex: Female) Other Clinician: Primary Care Physician: Lavena BullionHedrick, James Treating Delynda Sepulveda Referring Physician: Jerl MinaHedrick, James Physician/Extender: Tania AdeWeeks in Treatment: 10 Verbal / Phone Orders: Yes Clinician: Afful, RN, BSN, Rita Read Back and Verified: Yes Diagnosis Coding Wound Cleansing Wound #1 Right,Lateral Lower Leg o Clean wound with Normal Saline. Anesthetic Wound #1 Right,Lateral Lower Leg o Topical Lidocaine 4% cream applied to wound bed prior to debridement Skin Barriers/Peri-Wound Care Wound #1 Right,Lateral Lower Leg o Skin Prep Primary Wound Dressing Wound #1 Right,Lateral Lower Leg o Prisma Ag Secondary  Dressing Wound #1 Right,Lateral Lower Leg o Boardered Foam Dressing Dressing Change Frequency Wound #1 Right,Lateral Lower Leg o Change dressing every other day. Follow-up Appointments Wound #1 Right,Lateral Lower Leg o Return Appointment in 1 week. Edema Control Wound #1 Right,Lateral Lower Leg o Elevate legs to the level of the heart and pump ankles as often as possible o Support Garment 20-30 mm/Hg pressure to: [place], Eather ColasELORES ([id number]) Additional Orders / Instructions Wound #1 Right,Lateral Lower Leg o Increase protein intake. o Activity as tolerated o Other: - Zinc, MVI, Vit A, C Electronic Signature(s) Signed: 01/20/2016 4:11:36 PM By: Evlyn KannerBritto, Lorilynn Lehr MD, FACS Signed: 01/20/2016 5:11:59 PM By: Elpidio EricAfful, Rita BSN, RN Entered By: Elpidio EricAfful, Rita on 01/20/2016 10:20:59 [name] LittenBURRELL, Akyah ([id number]) -------------------------------------------------------------------------------- Problem List Details Patient Name: [name] LittenBURRELL, Anjolie 01/20/2016 10:00 Date of Service: AM Medical Record [id number] Number: Patient Account Number: [id number] 03/08/1941 (75 y.o. 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 Treating RN: Clover MealyAfful, RN, BSN, [place] Sinkita Date of Birth/Sex: Female) Other Clinician: Primary Care Physician: Jerl MinaHedrick, James Treating Evlyn KannerBritto, Peyton Spengler Referring Physician: Jerl MinaHedrick, James Physician/Extender: Weeks in Treatment: 10 Subjective Chief Complaint Information obtained from Patient Patient seen for complaints of Non-Healing Wound to the right lower extremity for about a month History of Present Illness (HPI) The following HPI elements were documented for the patient's wound: Location: Patient has an traumatic ulcer on her right leg Quality: Patient reports Tenderness to the affected area(s). Severity: Patient states wound are getting worse. Duration: Patient states the wound has been present for about 2 months [facility] Timing: She verbalized constant pain with periods of intermittent uneasiness Context: The wound occurred when she hit her leg against an object Modifying Factors: patient had sutures applied on the initial encounter, was removed and wound dehisced Associated Signs and Symptoms: Patient reports presence of swelling This is a 75 year old African American female who presented today to the wound clinic for further evaluation of a non-healing ulcer which resulted form trauma. She is alert and oriented x 4, pleasant and seemingly good historian. Appears calm and no acute distress noted. Her history includes but not limited to hypertension, Angina Pectoris, CAD, fibromyalgia. Wears glasses, ambulates with a cane. Wound debrided during this visit, Will use aquacel ag and bordered foam dressing. Will see patient back on a weekly basis. Patient verbalized understanding of instructions. 11/18/2015 -- the patient does not have any major problems except for hypertension, coronary artery disease  with cardiac cath done in November 2012 and a normal ejection fraction at 55%, iron deficiency anemia, chronic kidney disease stage II, venous stasis, fibromyalgia, and has had some left ankle surgery ( March this year) and in the remote past(2014) has had venous duplex studies done. was a DVT study and was negative for DVT. she has never been a smoker. 01/06/16: pt returns today after a several week absence. amazingly, her wound is improving with good epithelialization. she denies systemic s/s of infection. no new wounds or skin breakdown. no interval changes regarding health status. [place], Eather ColasELORES ([id number]) Objective Constitutional Pulse regular. Respirations normal and unlabored. Afebrile. Vitals Time Taken: 10:04 AM, Height: 67 in, Weight: 174 lbs, BMI: 27.2, Temperature: 97.5 F, Pulse: 64 bpm, Respiratory Rate: 17 breaths/min, Blood Pressure: 146/69 mmHg. Eyes Nonicteric. Reactive to light. Ears, Nose, Mouth, and Throat Lips, teeth, and gums WNL.Marland Kitchen. Moist mucosa without lesions. Neck supple and nontender. No palpable supraclavicular or cervical adenopathy. Normal sized without goiter. Respiratory WNL. No retractions.. Cardiovascular Pedal Pulses WNL. No clubbing, cyanosis or edema. Lymphatic No adneopathy. No adenopathy. No adenopathy. Musculoskeletal Adexa without tenderness or enlargement.. Digits and nails w/o clubbing, cyanosis, infection, petechiae, ischemia, or inflammatory conditions.Marland Kitchen. Psychiatric Judgement and insight Intact.. No evidence of depression, anxiety, or agitation..Marland Kitchen  General Notes: most of the wound has healed except for a small area at the most inferior part of the triangle where there is a 0.3 x 0.3 x 0.3 cm wound which has subcutaneous debris and I have sharply remove this with a toothed forcep and irrigated it well. Integumentary (Hair, Skin) No suspicious lesions. No crepitus or fluctuance. No peri-wound warmth or erythema. No masses.. Wound #1  status is Open. Original cause of wound was Trauma. The wound is located on the Right,Lateral Lower Leg. The wound measures 0.3cm length x 0.3cm width x 0.2cm depth; 0.071cm^2 area and 0.014cm^3 volume. The wound is limited to skin breakdown. There is no tunneling or undermining noted. There is a small amount of serous drainage noted. The wound margin is distinct with the outline attached to the wound base. There is large (67-100%) pink, pale granulation within the wound bed. There is a small Wulff, Teriana ([id number]) (1-33%) amount of necrotic tissue within the wound bed including Adherent Slough. The periwound skin appearance exhibited: Localized Edema, Moist. The periwound skin appearance did not exhibit: Callus, Crepitus, Excoriation, Fluctuance, Friable, Induration, Rash, Scarring, Dry/Scaly, Maceration, Atrophie Blanche, Cyanosis, Ecchymosis, Hemosiderin Staining, Mottled, Pallor, Rubor, Erythema. Periwound temperature was noted as No Abnormality. The periwound has tenderness on palpation. 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 Silver collagen to be applied daily with a bordered foam and then 2. Compression stockings of the 20-30 mm variety and I have discussed details of how this should be worn. She has not been very compliant with this 3. Regular visits to the wound center Procedures Wound #1 Wound #1 is a Dehisced Wound located on the Right,Lateral Lower Leg . There was a Skin/Subcutaneous Tissue Debridement ([id number]) debridement with total area of 0.09 sq cm performed by Evlyn Kanner, MD. with the following instrument(s): Forceps to remove Non-Viable tissue/material including Fibrin/Slough, Tendon, and Subcutaneous after achieving pain  control using Lidocaine 4% Topical Solution. A time out was conducted at 10:17, prior to the start of the procedure. A Minimum amount of bleeding was controlled with Pressure. The procedure was tolerated well with a pain level of 0 throughout and a pain level of 0 following the procedure. Post Debridement Measurements: 0.3cm length x 0.3cm width x 0.3cm depth; 0.021cm^3 volume. Character of Wound/Ulcer Post Debridement is stable. Severity of Tissue Post Debridement is: Fat layer exposed. Post procedure Diagnosis Wound #1: Same as Pre-Procedure STEPHANIE, LITTMAN ([id number]) Plan Wound Cleansing: Wound #1 Right,Lateral Lower Leg: Clean wound with Normal Saline. Anesthetic: Wound #1 Right,Lateral Lower Leg: Topical Lidocaine 4% cream applied to wound bed prior to debridement Skin Barriers/Peri-Wound Care: Wound #1 Right,Lateral Lower Leg: Skin Prep Primary Wound Dressing: Wound #1 Right,Lateral Lower Leg: Prisma Ag Secondary Dressing: Wound #1 Right,Lateral Lower Leg: Boardered Foam Dressing Dressing Change Frequency: Wound #1 Right,Lateral Lower Leg: Change dressing every other day. Follow-up Appointments: Wound #1 Right,Lateral Lower Leg: Return Appointment in 1 week. Edema Control: Wound #1 Right,Lateral Lower Leg: Elevate legs to the level of the heart and pump ankles as often as possible Support Garment 20-30 mm/Hg pressure to: Additional Orders / Instructions: Wound #1 Right,Lateral Lower Leg: Increase protein intake. Activity as tolerated Other: - Zinc, MVI, Vit A, C after review of wounds I have recommended: 1. Silver collagen to be applied daily with a  bordered foam and then 2. Compression stockings of the 20-30 mm variety and I have discussed details of how this should be worn. She has not been very compliant with this 3. Regular visits to the wound center Electronic Signature(s) Signed: 01/20/2016 10:25:09 AM By: Evlyn Kanner MD, FACS Entered By: Evlyn Kanner  on 01/20/2016 10:25:09 [name] Litten ([id number]) [place], Eather Colas ([id number]) -------------------------------------------------------------------------------- SuperBill Details Patient Name: [name] Litten Date of Service: 01/20/2016 Medical Record Patient Account Number: [id number] Number: Afful, RN, BSN, Treating RN: 22-Nov-1940 (75 y.o. [place] Sink Date of Birth/Sex: Female) Other Clinician: Primary Care Physician: [name] Treating Evlyn Kanner Referring Physician: [name] Physician/Extender: Tania Ade in Treatment: 10 Diagnosis Coding ICD-10 Codes Code Description [id number] Laceration without foreign body, right lower leg, initial encounter I89.0 Lymphedema, not elsewhere classified L97.212 Non-pressure chronic ulcer of right calf with fat layer exposed Facility Procedures CPT4 Code Description: [id number] 11042 - DEB SUBQ TISSUE 20 SQ CM/< ICD-10 Description Diagnosis S81.811A Laceration without foreign body, right lower leg, init I89.0 Lymphedema, not elsewhere classified L97.212 Non-pressure chronic ulcer of right calf  with fat laye Modifier: ial encounter r exposed Quantity: 1 Physician Procedures CPT4 Code Description: [id number] 11042 - WC PHYS SUBQ TISS 20 SQ CM ICD-10 Description Diagnosis S81.811A Laceration without foreign body, right lower leg, init I89.0 Lymphedema, not elsewhere classified L97.212 Non-pressure chronic ulcer of right calf  with fat laye Modifier: ial encounter r exposed Quantity: 1 Electronic Signature(s) Signed: 01/20/2016 10:27:27 AM By: Evlyn Kanner MD, FACS Entered By: Evlyn Kanner on 01/20/2016 10:27:27

## 2016-01-21 NOTE — Progress Notes (Signed)
[name] Cooper, [name] Cooper ([id number]) Visit Report for 01/20/2016 Arrival Information Details Patient Name: [name] Cooper, [name] Cooper Date of Service: 01/20/2016 10:00 AM Medical Record Number: [id number] Patient Account Number: [id number] Date of Birth/Sex: September 17, 1940 (75 y.o. Female) Treating RN: Afful, RN, BSN, [place] Sinkita Primary Care Physician: Jerl MinaHedrick, James Other Clinician: Referring Physician: Jerl MinaHedrick, James Treating Physician/Extender: Rudene ReBritto, Errol Weeks in Treatment: 10 Visit Information History Since Last Visit All ordered tests and consults were completed: No Patient Arrived: Ambulatory Added or deleted any medications: No Arrival Time: 10:03 Any new allergies or adverse reactions: No Accompanied By: self Had a fall or experienced change in No Transfer Assistance: None activities of daily living that may affect Patient Identification Verified: Yes risk of falls: Secondary Verification Process Yes Signs or symptoms of abuse/neglect since last No Completed: visito Patient Requires Transmission-Based No Hospitalized since last visit: No Precautions: Has Dressing in Place as Prescribed: Yes Patient Has Alerts: Yes Has Compression in Place as Prescribed: Yes Patient Alerts: Left ABI Pain Present Now: No 1.13 Electronic Signature(s) Signed: 01/20/2016 5:11:59 PM By: Elpidio EricAfful, Rita BSN, RN Entered By: Elpidio EricAfful, Rita on 01/20/2016 10:04:19 [name] Cooper, [name] Cooper ([id number]) -------------------------------------------------------------------------------- Encounter Discharge Information Details Patient Name: [name] Cooper, [name] Cooper Date of Service: 01/20/2016 10:00 AM Medical Record Number: [id number] Patient Account Number: [id number] Date of Birth/Sex: September 17, 1940 (75 y.o. Female) Treating RN: Clover MealyAfful, RN, BSN, [place] Sinkita Primary Care Physician: Jerl MinaHedrick, James Other Clinician: Referring Physician: Jerl MinaHedrick, James Treating Physician/Extender: Rudene ReBritto, Errol Weeks in Treatment: 10 Encounter Discharge Information  Items Discharge Pain Level: 0 Discharge Condition: Stable Ambulatory Status: Ambulatory Discharge Destination: Home Transportation: Private Auto Accompanied By: self Schedule Follow-up Appointment: No Medication Reconciliation completed No and provided to Patient/Care Phillis Thackeray: Patient Clinical Summary of Care: Declined Electronic Signature(s) Signed: 01/20/2016 4:40:00 PM By: Elpidio EricAfful, Rita BSN, RN Previous Signature: 01/20/2016 10:25:41 AM Version By: Gwenlyn PerkingMoore, Shelia Entered By: Elpidio EricAfful, Rita on 01/20/2016 16:40:00 [name] Cooper, [name] Cooper ([id number]) -------------------------------------------------------------------------------- Lower Extremity Assessment Details Patient Name: [name] Cooper, [name] Cooper Date of Service: 01/20/2016 10:00 AM Medical Record Number: [id number] Patient Account Number: [id number] Date of Birth/Sex: September 17, 1940 (75 y.o. Female) Treating RN: Afful, RN, BSN, [place] Sinkita Primary Care Physician: Jerl MinaHedrick, James Other Clinician: Referring Physician: Jerl MinaHedrick, James Treating Physician/Extender: Rudene ReBritto, Errol Weeks in Treatment: 10 Edema Assessment Assessed: Kyra Searles[Left: No] Franne Forts[Right: No] E[Left: dema] [Right: :] Calf Left: Right: Point of Measurement: 35 cm From Medial Instep cm 37 cm Ankle Left: Right: Point of Measurement: 9 cm From Medial Instep cm 23 cm Vascular Assessment Claudication: Claudication Assessment [Right:None] Pulses: Posterior Tibial Dorsalis Pedis Palpable: [Right:Yes] Extremity colors, hair growth, and conditions: Extremity Color: [Right:Mottled] Hair Growth on Extremity: [Right:No] Temperature of Extremity: [Right:Warm] Capillary Refill: [Right:< 3 seconds] Electronic Signature(s) Signed: 01/20/2016 5:11:59 PM By: Elpidio EricAfful, Rita BSN, RN Entered By: Elpidio EricAfful, Rita on 01/20/2016 10:06:13 [name] Cooper, Janitza ([id number]) -------------------------------------------------------------------------------- Multi Wound Chart Details Patient Name: [name] Cooper, [name] Cooper Date of  Service: 01/20/2016 10:00 AM Medical Record Number: [id number] Patient Account Number: [id number] Date of Birth/Sex: September 17, 1940 (75 y.o. Female) Treating RN: Clover MealyAfful, RN, BSN, [place] Sinkita Primary Care Physician: Jerl MinaHedrick, James Other Clinician: Referring Physician: Jerl MinaHedrick, James Treating Physician/Extender: Rudene ReBritto, Errol Weeks in Treatment: 10 Vital Signs Height(in): 67 Pulse(bpm): 64 Weight(lbs): 174 Blood Pressure 146/69 (mmHg): Body Mass Index(BMI): 27 Temperature(F): 97.5 Respiratory Rate 17 (breaths/min): Photos: [1:No Photos] [N/A:N/A] Wound Location: [1:Right Lower Leg - Lateral] [N/A:N/A] Wounding Event: [1:Trauma] [N/A:N/A] Primary Etiology: [1:Dehisced Wound] [N/A:N/A] Secondary Etiology: [1:Lymphedema] [N/A:N/A] Comorbid History: [1:Anemia, Arrhythmia, Hypertension, Osteoarthritis] [N/A:N/A] Date Acquired: [1:10/05/2015] [N/A:N/A] Weeks of Treatment: [1:10] [N/A:N/A] Wound Status: [1:Open] [N/A:N/A] Measurements L x W x  D 0.3x0.3x0.2 [N/A:N/A] (cm) Area (cm) : [1:0.071] [N/A:N/A] Volume (cm) : [1:0.014] [N/A:N/A] % Reduction in Area: [1:99.40%] [N/A:N/A] % Reduction in Volume: 99.60% [N/A:N/A] Classification: [1:Full Thickness Without Exposed Support Structures] [N/A:N/A] Exudate Amount: [1:Small] [N/A:N/A] Exudate Type: [1:Serous] [N/A:N/A] Exudate Color: [1:amber] [N/A:N/A] Wound Margin: [1:Distinct, outline attached] [N/A:N/A] Granulation Amount: [1:Large (67-100%)] [N/A:N/A] Granulation Quality: [1:Pink, Pale] [N/A:N/A] Necrotic Amount: [1:Small (1-33%)] [N/A:N/A] Exposed Structures: [1:Fascia: No Fat: No Tendon: No] [N/A:N/A] Muscle: No Joint: No Bone: No Limited to Skin Breakdown Epithelialization: Large (67-100%) N/A N/A Periwound Skin Texture: Edema: Yes N/A N/A Excoriation: No Induration: No Callus: No Crepitus: No Fluctuance: No Friable: No Rash: No Scarring: No Periwound Skin Moist: Yes N/A N/A Moisture: Maceration: No Dry/Scaly:  No Periwound Skin Color: Atrophie Blanche: No N/A N/A Cyanosis: No Ecchymosis: No Erythema: No Hemosiderin Staining: No Mottled: No Pallor: No Rubor: No Temperature: No Abnormality N/A N/A Tenderness on Yes N/A N/A Palpation: Wound Preparation: Ulcer Cleansing: N/A N/A Rinsed/Irrigated with Saline Topical Anesthetic Applied: Other: lidocaine 4% Treatment Notes Electronic Signature(s) Signed: 01/20/2016 5:11:59 PM By: Elpidio Eric BSN, RN Entered By: Elpidio Eric on 01/20/2016 10:19:21 [name] Cooper ([id number]) -------------------------------------------------------------------------------- Multi-Disciplinary Care Plan Details Patient Name: [name] Cooper Date of Service: 01/20/2016 10:00 AM Medical Record Number: [id number] Patient Account Number: [id number] Date of Birth/Sex: 29-May-1940 (75 y.o. Female) Treating RN: Afful, RN, BSN, [place] Sink Primary Care Physician: [name] Other Clinician: Referring Physician: [name] Treating Physician/Extender: Rudene Re in Treatment: 10 Active Inactive Orientation to the Wound Care Program Nursing Diagnoses: Knowledge deficit related to the wound healing center program Goals: Patient/caregiver will verbalize understanding of the Wound Healing Center Program Date Initiated: 11/11/2015 Goal Status: Active Interventions: Provide education on orientation to the wound center Notes: Wound/Skin Impairment Nursing Diagnoses: Impaired tissue integrity Goals: Patient/caregiver will verbalize understanding of skin care regimen Date Initiated: 11/11/2015 Goal Status: Active Ulcer/skin breakdown will have a volume reduction of 30% by week 4 Date Initiated: 11/11/2015 Goal Status: Active Ulcer/skin breakdown will have a volume reduction of 50% by week 8 Date Initiated: 11/11/2015 Goal Status: Active Ulcer/skin breakdown will have a volume reduction of 80% by week 12 Date Initiated: 11/11/2015 Goal Status:  Active Ulcer/skin breakdown will heal within 14 weeks Date Initiated: 11/11/2015 Goal Status: Active [name] Cooper, [name] Cooper ([id number]) Interventions: Assess patient/caregiver ability to obtain necessary supplies Assess patient/caregiver ability to perform ulcer/skin care regimen upon admission and as needed Assess ulceration(s) every visit Notes: Electronic Signature(s) Signed: 01/20/2016 5:11:59 PM By: Elpidio Eric BSN, RN Entered By: Elpidio Eric on 01/20/2016 10:19:08 [name] Cooper ([id number]) -------------------------------------------------------------------------------- Pain Assessment Details Patient Name: [name] Cooper Date of Service: 01/20/2016 10:00 AM Medical Record Number: [id number] Patient Account Number: [id number] Date of Birth/Sex: [birth year] (75 y.o. Female) Treating RN: Clover Mealy, RN, BSN, [place] Sink Primary Care Physician: [name] Other Clinician: Referring Physician: [name] Treating Physician/Extender: Rudene Re in Treatment: 10 Active Problems Location of Pain Severity and Description of Pain Patient Has Paino No Site Locations With Dressing Change: No Pain Management and Medication Current Pain Management: Electronic Signature(s) Signed: 01/20/2016 5:11:59 PM By: Elpidio Eric BSN, RN Entered By: Elpidio Eric on 01/20/2016 10:04:26 [name] Cooper ([id number]) -------------------------------------------------------------------------------- Patient/Caregiver Education Details Patient Name: [name] Cooper Date of Service: 01/20/2016 10:00 AM Medical Record Number: [id number] Patient Account Number: [id number] Date of Birth/Gender: 07/17/1940 (75 y.o. Female) Treating RN: Afful, RN, BSN, [place] Sink Primary Care Physician: [name] Other Clinician: Referring Physician: [name] Treating Physician/Extender: Rudene Re in Treatment: 10 Education Assessment Education Provided To:  Patient Education Topics Provided Welcome To  The Wound Care Center: Methods: Explain/Verbal Responses: State content correctly Wound Debridement: Methods: Explain/Verbal Responses: State content correctly Wound/Skin Impairment: Methods: Explain/Verbal Responses: State content correctly Electronic Signature(s) Signed: 01/20/2016 5:11:59 PM By: Elpidio EricAfful, Rita BSN, RN Entered By: Elpidio EricAfful, Rita on 01/20/2016 16:40:17 [name] Cooper, Navina ([id number]) -------------------------------------------------------------------------------- Wound Assessment Details Patient Name: [name] Cooper, Shunda Date of Service: 01/20/2016 10:00 AM Medical Record Number: [id number] Patient Account Number: [id number] Date of Birth/Sex: Apr 22, 1940 (75 y.o. Female) Treating RN: Afful, RN, BSN, Rita Primary Care Physician: Jerl MinaHedrick, James Other Clinician: Referring Physician: Jerl MinaHedrick, James Treating Physician/Extender: Rudene ReBritto, Errol Weeks in Treatment: 10 Wound Status Wound Number: 1 Primary Dehisced Wound Etiology: Wound Location: Right Lower Leg - Lateral Secondary Lymphedema Wounding Event: Trauma Etiology: Date Acquired: 10/05/2015 Wound Status: Open Weeks Of Treatment: 10 Comorbid Anemia, Arrhythmia, Hypertension, Clustered Wound: No History: Osteoarthritis Photos Photo Uploaded By: Elpidio EricAfful, Rita on 01/20/2016 16:58:42 Wound Measurements Length: (cm) 0.3 Width: (cm) 0.3 Depth: (cm) 0.2 Area: (cm) 0.071 Volume: (cm) 0.014 % Reduction in Area: 99.4% % Reduction in Volume: 99.6% Epithelialization: Large (67-100%) Tunneling: No Undermining: No Wound Description Full Thickness Without Exposed Classification: Support Structures Wound Margin: Distinct, outline attached Exudate Small Amount: Exudate Type: Serous Exudate Color: amber Foul Odor After Cleansing: No Wound Bed Granulation Amount: Large (67-100%) Exposed Structure Granulation Quality: Pink, Pale Fascia Exposed: No Necrotic Amount: Small (1-33%) Fat Layer Exposed: No Necrotic  Quality: Adherent Slough Tendon Exposed: No Olvey, Myda ([id number]) Muscle Exposed: No Joint Exposed: No Bone Exposed: No Limited to Skin Breakdown Periwound Skin Texture Texture Color No Abnormalities Noted: No No Abnormalities Noted: No Callus: No Atrophie Blanche: No Crepitus: No Cyanosis: No Excoriation: No Ecchymosis: No Fluctuance: No Erythema: No Friable: No Hemosiderin Staining: No Induration: No Mottled: No Localized Edema: Yes Pallor: No Rash: No Rubor: No Scarring: No Temperature / Pain Moisture Temperature: No Abnormality No Abnormalities Noted: No Tenderness on Palpation: Yes Dry / Scaly: No Maceration: No Moist: Yes Wound Preparation Ulcer Cleansing: Rinsed/Irrigated with Saline Topical Anesthetic Applied: Other: lidocaine 4%, Treatment Notes Wound #1 (Right, Lateral Lower Leg) 1. Cleansed with: Clean wound with Normal Saline 4. Dressing Applied: Prisma Ag 5. Secondary Dressing Applied Bordered Foam Dressing 7. Secured with Patient to wear own compression stockings Electronic Signature(s) Signed: 01/20/2016 5:11:59 PM By: Elpidio EricAfful, Rita BSN, RN Entered By: Elpidio EricAfful, Rita on 01/20/2016 10:09:59 [name] Cooper, Isatu ([id number]) -------------------------------------------------------------------------------- Vitals Details Patient Name: [name] Cooper, Christee Date of Service: 01/20/2016 10:00 AM Medical Record Number: [id number] Patient Account Number: [id number] Date of Birth/Sex: Apr 22, 1940 (75 y.o. Female) Treating RN: Afful, RN, BSN, Rita Primary Care Physician: Jerl MinaHedrick, James Other Clinician: Referring Physician: Jerl MinaHedrick, James Treating Physician/Extender: Rudene ReBritto, Errol Weeks in Treatment: 10 Vital Signs Time Taken: 10:04 Temperature (F): 97.5 Height (in): 67 Pulse (bpm): 64 Weight (lbs): 174 Respiratory Rate (breaths/min): 17 Body Mass Index (BMI): 27.2 Blood Pressure (mmHg): 146/69 Reference Range: 80 - 120 mg / dl Electronic  Signature(s) Signed: 01/20/2016 5:11:59 PM By: Elpidio EricAfful, Rita BSN, RN Entered By: Elpidio EricAfful, Rita on 01/20/2016 10:04:50

## 2016-01-24 LAB — METHYLMALONIC ACID, SERUM: Methylmalonic Acid, Quantitative: 778 nmol/L — ABNORMAL HIGH (ref 0–378)

## 2016-02-01 ENCOUNTER — Inpatient Hospital Stay: Payer: Medicare Other | Attending: Internal Medicine | Admitting: Internal Medicine

## 2016-02-01 ENCOUNTER — Inpatient Hospital Stay: Payer: Medicare Other

## 2016-02-01 VITALS — BP 143/74 | HR 71 | Temp 97.1°F | Resp 18 | Wt 181.0 lb

## 2016-02-01 DIAGNOSIS — Z79899 Other long term (current) drug therapy: Secondary | ICD-10-CM | POA: Insufficient documentation

## 2016-02-01 DIAGNOSIS — D631 Anemia in chronic kidney disease: Secondary | ICD-10-CM | POA: Diagnosis not present

## 2016-02-01 DIAGNOSIS — I129 Hypertensive chronic kidney disease with stage 1 through stage 4 chronic kidney disease, or unspecified chronic kidney disease: Secondary | ICD-10-CM

## 2016-02-01 DIAGNOSIS — N189 Chronic kidney disease, unspecified: Secondary | ICD-10-CM | POA: Insufficient documentation

## 2016-02-01 DIAGNOSIS — E538 Deficiency of other specified B group vitamins: Secondary | ICD-10-CM | POA: Insufficient documentation

## 2016-02-01 DIAGNOSIS — D518 Other vitamin B12 deficiency anemias: Secondary | ICD-10-CM

## 2016-02-01 DIAGNOSIS — R700 Elevated erythrocyte sedimentation rate: Secondary | ICD-10-CM | POA: Diagnosis not present

## 2016-02-01 MED ORDER — CYANOCOBALAMIN 1000 MCG/ML IJ SOLN
1000.0000 ug | Freq: Once | INTRAMUSCULAR | Status: AC
  Administered 2016-02-01: 1000 ug via INTRAMUSCULAR
  Filled 2016-02-01: qty 1

## 2016-02-01 MED ORDER — CYANOCOBALAMIN 1000 MCG/ML IJ KIT: PACK | INTRAMUSCULAR | 3 refills | Status: DC

## 2016-02-01 NOTE — Progress Notes (Signed)
Patient is here to discuss lab results from last MD visit.  She reports feeling cold all the time.

## 2016-02-01 NOTE — Progress Notes (Signed)
[name]  [phone] [phone]  ID: [name] Cooper OB: Feb 15, 1941  MR#: [id number]  YHO#:[id number]  Patient Care Team: [name], MD as PCP - General (Family Medicine) Colleen Can, MD as Referring Physician (Orthopedic Surgery) Minna Merritts, MD as Consulting Physician (Cardiology)  CHIEF COMPLAINT: Anemia  History of present illness: This is a 75 year old lady  is referred here for hemoglobin of about 10 noted on routine exam. She recalls having felt tired for a long time.  She denies any shortness of breath or chest pain at rest.   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  She has a history of coronary artery disease and carotid artery stenosis.She follows with cardiology and has been stable from a cardiac standpoint.   She has a history of hypertension, fibromyalgia, and chronic kidney disease She reports no dysuria. Denies any pain at present. She denies any melena, hematochezia OR change in her bowel habits. She does not recall if and when she had any colonoscopy or upper GI endoscopy.    She has a history of frozen shoulder hypertension cervical spinal decompression.    Review of her chart shows that she has had anemia dating back to 2014 when she was [age] and has actually shown some improvement over time the latest when I see from the chart is 10.1 from October 2015. She is not currently on any injectable B12 or erythropoietin injections. She states that she had received IV iron in the past here at [facility] however those records are not available to me today. Review of her chart also shows that she has had chronic kidney disease with serum creatinine ranging between 1.5-2 again the most recent value ICs  1.47 from October 2015. Records from [facility] show that her hemoglobin was 9.9 on 12/09/2015 as declined from about 10.8 back in April 2016. Platelet counthad been normal. Serum creatinine from August 2017 was 1.4 with a GFR at 44.  REVIEW OF SYSTEMS:   ROS  As per HPI. Otherwise, a complete review of systems is negative.  PAST MEDICAL HISTORY: Past Medical History:  Diagnosis Date  . Arthritis    left hip, left ankle  . Cholelithiases   . CKD (chronic kidney disease), stage II   . Coronary artery disease    a. cardiac cath 01/2011: mid LAD stent patent, mild lumenal irreg's, nl EF 55%, no AS/MR  . Fibromyalgia   . Frozen shoulder   . History of kidney stones   . HTN (hypertension)    controlled with medication;   . Hyperlipidemia    a. statin intolerant  . Iron deficiency   . Thrombocytopenia ([place])   . UTI (urinary tract infection)   . Venous stasis     PAST SURGICAL HISTORY: Past Surgical History:  Procedure Laterality Date  . CARDIAC CATHETERIZATION     stents placed in mid LAD  . CERVICAL SPINE SURGERY    . FETAL BLOOD TRANSFUSION  2014  . Frozen Shoulder    . KIDNEY STONE SURGERY    . LEG SURGERY     broke her leg     FAMILY HISTORY: Family History  Problem Relation Age of Onset  . Hypertension Mother   . Heart attack Mother   . Heart attack Father   .  Heart disease Brother     h/o CABG  . Diabetes Brother   . Kidney disease Brother     ADVANCED DIRECTIVES (Y/N):  N  HEALTH MAINTENANCE: Social History  Substance Use Topics  . Smoking status: Never Smoker  . Smokeless tobacco: Never Used  . Alcohol use No     Colonoscopy:  PAP:  Bone density:  Lipid panel:  Allergies  Allergen Reactions  . Sulfa Antibiotics Anaphylaxis    Current Outpatient Prescriptions  Medication Sig Dispense Refill  . Acetaminophen (TYLENOL 8 HOUR PO) Take by mouth.    Marland Kitchen aspirin EC 81 MG tablet Take 81 mg by mouth every other day.  90 tablet 3  . conjugated  estrogens (PREMARIN) vaginal cream Place 1 Applicatorful vaginally daily. Patient was given a sample of vaginal estrogen cream and instructed to apply 0.65m (pea-sized amount)  just inside the vaginal introitus with a finger-tip every night for two weeks and then Monday, Wednesday and Friday nights. 42.5 g 12  . losartan-hydrochlorothiazide (HYZAAR) 100-25 MG tablet take 1 tablet by mouth once daily 90 tablet 3  . zolpidem (AMBIEN) 10 MG tablet Take 10 mg by mouth at bedtime as needed.       No current facility-administered medications for this visit.     OBJECTIVE: Vitals:   01/18/16 1017  BP: 134/76  Pulse: 61  Temp: 97.2 F (36.2 C)     Body mass index is 28.09 kg/m.   1.96 meters squared   Physical Exam  She appears pale but in no apparent distress. She is well-developed well-nourished. Mucous membranes are normal. No petechiae or skin rashes noted. No palpable lymph nodes in the neck supraclavicular axillary areas. Abdomen shows no hepatosplenomegaly bowel sounds are normal. Right ankle is in a dressing for chronic ulcer I have not open-ended to examine. Left leg is normal.   LAB RESULTS:  Lab Results  Component Value Date   NA 141 12/28/2013   K 3.6 12/28/2013   CL 107 12/28/2013   CO2 28 12/28/2013   GLUCOSE 91 12/28/2013   BUN 22 (H) 12/28/2013   CREATININE 1.47 (H) 12/28/2013   CALCIUM 9.1 12/28/2013   PROT 6.9 12/31/2014   ALBUMIN 3.7 12/31/2014   AST 25 12/31/2014   ALT 10 12/31/2014   ALKPHOS 133 (H) 12/31/2014   BILITOT 0.3 12/31/2014   GFRNONAA 37 (L) 12/28/2013   GFRAA 45 (L) 12/28/2013    Lab Results  Component Value Date   WBC 9.2 01/18/2016   NEUTROABS 6.1 01/18/2016   HGB 10.1 (L) 01/18/2016   HCT 30.6 (L) 01/18/2016   MCV 81.7 01/18/2016   PLT 297 01/18/2016        ASSESSMENT:    Long-standing anemia likely multifactorial etiology.  She reports having had a history of iron deficiency several years ago and apparently had IV iron  infusions here however I searched on her record and I could not find any details with regard to that. She did however see Dr. FGrayland Ormondin 2014 when she was admitted to the hospital for MRSA infection and at that time she had thrombocytopenia it was transient and resolved on its own. It's unclear whether she ever had any bone marrow biopsy.  Nevertheless her hemoglobin is about 10 now I doubt that she has ongoing iron deficiency. Her chart there is some indication that she has had rheumatology workup however no identifiable autoimmune disease has been recommended. She also has been on prednisone in the past intermittently.  Records also show that she has a chronically elevated sedimentation rate. Therefore I suspect that she has anemia related to chronic inflammation the most obvious at the moment is the right leg posttraumatic ulcer. 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 At her age, myelodysplastic syndrome cannot be ruled out.  I will start by looking for any reversible causes. I will check her iron panel serum without malonic acid levels and erythropoietin and reticulocyte count. I'll also rule out monoclonal gammopathy.  We'll have her return in follow-up 2 weeks to review blood work  In the future if her hemoglobin were to drop to less than 10 after correcting all reversible causes, she will need a bone marrow biopsy and possibly erythropoietin supplementation to maintain her hemoglobin around 10.     Orders Placed This Encounter  Procedures  . Erythropoietin    Standing Status:   Future    Number of Occurrences:   1    Standing Expiration Date:   01/17/2017  . Methylmalonic Acid, Serum  . Reticulocytes ([place])    Standing Status:   Future    Number of Occurrences:   1    Standing Expiration Date:   01/17/2017  . Multiple Myeloma Panel (SPEP&IFE w/QIG)    Standing Status:   Future    Number of Occurrences:   1    Standing  Expiration Date:   01/17/2017  . Iron and TIBC    Standing Status:   Future    Number of Occurrences:   1    Standing Expiration Date:   01/17/2017  . Ferritin    Standing Status:   Future    Number of Occurrences:   1    Standing Expiration Date:   01/17/2017  . CBC with Differential    Standing Status:   Future    Number of Occurrences:   1    Standing Expiration Date:   01/17/2017          Creola Corn, MD   02/01/2016 10:05 AM

## 2016-02-02 NOTE — Progress Notes (Signed)
[name] Cooper returns today to review the results of blood work ordered for chronic anemia. She denies any new problems since her last visit. Her right leg ulcer continues to heal. Her serum erythropoietin levels are normal SPEP shows no monoclonal gammopathy. Platelet count appears to suggest hypoproliferative marrow for the degree of anemia. Her methylmalonic acid levels in the serum and elevated indicating B12 deficiency. However her iron panel is not consistent with iron deficiency. Ferritin of 399 indicates presence for chronic inflammatory state.  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 She is had an elevated sedimentation rate for a very long time and a nonhealing ulcer in the right ankle for which she follows with wound care. I would also recommend that she see nephrology for the chronic kidney disease. I do not see any evidence of monoclonal gammopathy in the serum at this time. However for B12 deficiency I would begin injection B12. We will teach her to self inject B12 subcutaneously today in the office. She prefers to continue the injections at home prescription has been given. Return for follow-up in 3 months.  I will also arrange  24-hour urine for monoclonal protein   At that time if her hemoglobin does not show improvement, we will initiate workup with a bone marrow biopsy because low grade myelodysplastic syndrome is under consideration.

## 2016-02-03 ENCOUNTER — Ambulatory Visit: Payer: Medicare Other | Admitting: Surgery

## 2016-02-07 DIAGNOSIS — I129 Hypertensive chronic kidney disease with stage 1 through stage 4 chronic kidney disease, or unspecified chronic kidney disease: Secondary | ICD-10-CM | POA: Diagnosis not present

## 2016-02-08 ENCOUNTER — Other Ambulatory Visit: Payer: Self-pay | Admitting: *Deleted

## 2016-02-08 DIAGNOSIS — D518 Other vitamin B12 deficiency anemias: Secondary | ICD-10-CM

## 2016-02-10 ENCOUNTER — Encounter: Payer: Medicare Other | Attending: Surgery | Admitting: Surgery

## 2016-02-10 DIAGNOSIS — L97212 Non-pressure chronic ulcer of right calf with fat layer exposed: Secondary | ICD-10-CM | POA: Diagnosis not present

## 2016-02-10 DIAGNOSIS — X58XXXA Exposure to other specified factors, initial encounter: Secondary | ICD-10-CM | POA: Diagnosis not present

## 2016-02-10 DIAGNOSIS — N182 Chronic kidney disease, stage 2 (mild): Secondary | ICD-10-CM | POA: Insufficient documentation

## 2016-02-10 DIAGNOSIS — M797 Fibromyalgia: Secondary | ICD-10-CM | POA: Diagnosis not present

## 2016-02-10 DIAGNOSIS — I129 Hypertensive chronic kidney disease with stage 1 through stage 4 chronic kidney disease, or unspecified chronic kidney disease: Secondary | ICD-10-CM | POA: Insufficient documentation

## 2016-02-10 DIAGNOSIS — I25119 Atherosclerotic heart disease of native coronary artery with unspecified angina pectoris: Secondary | ICD-10-CM | POA: Insufficient documentation

## 2016-02-10 DIAGNOSIS — I890 Lymphedema, not elsewhere classified: Secondary | ICD-10-CM | POA: Insufficient documentation

## 2016-02-10 DIAGNOSIS — D649 Anemia, unspecified: Secondary | ICD-10-CM | POA: Diagnosis not present

## 2016-02-10 DIAGNOSIS — S81811A Laceration without foreign body, right lower leg, initial encounter: Secondary | ICD-10-CM | POA: Diagnosis present

## 2016-02-10 LAB — UPEP/TP, 24-HR URINE
ALPHA 2, URINE: 15.4 %
Albumin, U: 29.4 %
Alpha 1, Urine: 4.7 %
BETA UR: 29.4 %
GAMMA UR: 21.1 %
Total Protein, Urine-Ur/day: 178 mg/24 hr — ABNORMAL HIGH (ref 30–150)
Total Protein, Urine: 22.2 mg/dL
Total Volume: 800

## 2016-02-10 LAB — IMMUNOFIXATION, URINE

## 2016-02-11 NOTE — Progress Notes (Addendum)
[name] Cooper, [name] Cooper ([id number]) Visit Report for 02/10/2016 Chief Complaint Document Details Patient Name: [name] Cooper, [name] Cooper 02/10/2016 10:15 Date of Service: AM Medical Record [id number] Number: Patient Account Number: [id number] October 14, 1940 (75 y.o. Treating RN: Huel CoventryWoody, Kim Date of Birth/Sex: Female) Other Clinician: Primary Care Physician: Jerl MinaHedrick, James Treating Evlyn KannerBritto, Gerrett Loman Referring Physician: Jerl MinaHedrick, James Physician/Extender: Tania AdeWeeks in Treatment: 13 Information Obtained from: Patient Chief Complaint Patient seen for complaints of Non-Healing Wound to the right lower extremity for about a month Electronic Signature(s) Signed: 02/10/2016 10:46:21 AM By: Evlyn KannerBritto, Jeshurun Oaxaca MD, FACS Entered By: Evlyn KannerBritto, Marrell Dicaprio on 02/10/2016 10:46:21 [name] Cooper, [name] Cooper ([id number]) -------------------------------------------------------------------------------- HPI Details Patient Name: [name] Cooper, [name] Cooper 02/10/2016 10:15 Date of Service: AM Medical Record [id number] Number: Patient Account Number: [id number] October 14, 1940 (75 y.o. Treating RN: Huel CoventryWoody, Kim Date of Birth/Sex: Female) Other Clinician: Primary Care Physician: Jerl MinaHedrick, James Treating Evlyn KannerBritto, Grant Swager Referring Physician: Jerl MinaHedrick, James Physician/Extender: Tania AdeWeeks in Treatment: 13 History of Present Illness Location: Patient has an traumatic ulcer on her right leg Quality: Patient reports Tenderness to the affected area(s). Severity: Patient states wound are getting worse. Duration: Patient states the wound has been present for about 2 months prior [facility] Timing: She verbalized constant pain with periods of intermittent uneasiness Context: The wound occurred when she hit her leg against an object Modifying Factors: patient had sutures applied on the initial encounter, was removed and wound dehisced Associated Signs and Symptoms: Patient reports presence of swelling HPI Description: This is a 75 year old African American female  who presented today to the wound clinic for further evaluation of a non-healing ulcer which resulted form trauma. She is alert and oriented x 4, pleasant and seemingly good historian. Appears calm and no acute distress noted. Her history includes but not limited to hypertension, Angina Pectoris, CAD, fibromyalgia. Wears glasses, ambulates with a cane. Wound debrided during this visit, Will use aquacel ag and bordered foam dressing. Will see patient back on a weekly basis. Patient verbalized understanding of instructions. 11/18/2015 -- the patient does not have any major problems except for hypertension, coronary artery disease with cardiac cath done in November 2012 and a normal ejection fraction at 55%, iron deficiency anemia, chronic kidney disease stage II, venous stasis, fibromyalgia, and has had some left ankle surgery ( March this year) and in the remote past(2014) has had venous duplex studies done. was a DVT study and was negative for DVT. she has never been a smoker. 01/06/16: pt returns today after a several week absence. amazingly, her wound is improving with good epithelialization. she denies systemic s/s of infection. no new wounds or skin breakdown. no interval changes regarding health status. 02/10/2016 -- the patient is been back to see as after about 3 weeks and says she was unwell with a cold and fever Electronic Signature(s) Signed: 02/10/2016 10:46:46 AM By: Evlyn KannerBritto, Tanna Loeffler MD, FACS Entered By: Evlyn KannerBritto, Kensly Bowmer on 02/10/2016 10:46:45 [name] Cooper, Rashanda ([id number]) -------------------------------------------------------------------------------- Physical Exam Details Patient Name: [name] Cooper, [name] Cooper 02/10/2016 10:15 Date of Service: AM Medical Record [id number] Number: Patient Account Number: [id number] October 14, 1940 (75 y.o. Treating RN: Huel CoventryWoody, Kim Date of Birth/Sex: Female) Other Clinician: Primary Care Physician: Jerl MinaHedrick, James Treating Evlyn KannerBritto, Marianela Mandrell Referring Physician:  Jerl MinaHedrick, James Physician/Extender: Tania AdeWeeks in Treatment: 13 Constitutional . Pulse regular. Respirations normal and unlabored. Afebrile. . Eyes Nonicteric. Reactive to light. Ears, Nose, Mouth, and Throat Lips, teeth, and gums WNL.Marland Kitchen. Moist mucosa without lesions. Neck supple and nontender. No palpable supraclavicular or cervical adenopathy. Normal sized without goiter. Respiratory WNL. No retractions.. Breath sounds  WNL, No rubs, rales, rhonchi, or wheeze.. Cardiovascular Heart rhythm and rate regular, no murmur or gallop.. Pedal Pulses WNL. No clubbing, cyanosis or edema. Lymphatic No adneopathy. No adenopathy. No adenopathy. Musculoskeletal Adexa without tenderness or enlargement.. Digits and nails w/o clubbing, cyanosis, infection, petechiae, ischemia, or inflammatory conditions.. Integumentary (Hair, Skin) No suspicious lesions. No crepitus or fluctuance. No peri-wound warmth or erythema. No masses.Marland Kitchen Psychiatric Judgement and insight Intact.. No evidence of depression, anxiety, or agitation.. Notes the wound has healed. Electronic Signature(s) Signed: 02/10/2016 10:49:02 AM By: Evlyn Kanner MD, FACS Entered By: Evlyn Kanner on 02/10/2016 10:49:01 [name] Cooper ([id number]) -------------------------------------------------------------------------------- Physician Orders Details Patient Name: [name] Cooper, [name] Cooper 02/10/2016 10:15 Date of Service: AM Medical Record [id number] Number: Patient Account Number: [id number] [birth year] (75 y.o. Treating RN: Huel Coventry Date of Birth/Sex: Female) Other Clinician: Primary Care Physician: [name] Treating Evlyn Kanner Referring Physician: [name] Physician/Extender: Tania Ade in Treatment: 13 Verbal / Phone Orders: Yes Clinician: Huel Coventry Read Back and Verified: Yes Diagnosis Coding ICD-10 Coding Code Description [phone] Laceration without foreign body, right lower leg, initial encounter I89.0 Lymphedema, not  elsewhere classified L97.212 Non-pressure chronic ulcer of right calf with fat layer exposed Discharge From [facility] Services o Discharge from Wound Care Center Electronic Signature(s) Signed: 02/10/2016 4:17:50 PM By: Evlyn Kanner MD, FACS Signed: 02/10/2016 5:15:40 PM By: Elliot Gurney, RN, BSN, Kim RN, BSN Entered By: Elliot Gurney, RN, BSN, Kim on 02/10/2016 10:47:38 [name] Cooper ([id number]) -------------------------------------------------------------------------------- Problem List Details Patient Name: [name] Cooper, [name] Cooper 02/10/2016 10:15 Date of Service: AM Medical Record [id number] Number: Patient Account Number: [id number] 1940/04/27 (75 y.o. Treating RN: Huel Coventry Date of Birth/Sex: Female) Other Clinician: Primary Care Physician: [name] Treating Evlyn Kanner Referring Physician: [name] Physician/Extender: Tania Ade in Treatment: 13 Active Problems ICD-10 Encounter Code Description Active Date Diagnosis S81.811A Laceration without foreign body, right lower leg, initial 12/02/2015 Yes encounter I89.0 Lymphedema, not elsewhere classified 12/02/2015 Yes L97.212 Non-pressure chronic ulcer of right calf with fat layer 12/02/2015 Yes exposed Inactive Problems Resolved Problems Electronic Signature(s) Signed: 02/10/2016 10:46:13 AM By: Evlyn Kanner MD, FACS Entered By: Evlyn Kanner on 02/10/2016 10:46:13 [name] Cooper ([id number]) -------------------------------------------------------------------------------- Progress Note Details Patient Name: [name] Cooper, [name] Cooper 02/10/2016 10:15 Date of Service: AM Medical Record [id number] Number: Patient Account Number: [id number] 1940-07-31 (75 y.o. Treating RN: Huel Coventry Date of Birth/Sex: Female) Other Clinician: Primary Care Physician: [name] Treating Evlyn Kanner Referring Physician: [name] Physician/Extender: Tania Ade in Treatment: 13 Subjective Chief Complaint Information obtained from Patient Patient  seen for complaints of Non-Healing Wound to the right lower extremity for about a month History of Present Illness (HPI) The following HPI elements were documented for the patient's wound: Location: Patient has an traumatic ulcer on her right leg Quality: Patient reports Tenderness to the affected area(s). Severity: Patient states wound are getting worse. Duration: Patient states the wound has been present for about 2 months prior to seeking help from the [facility] Timing: She verbalized constant pain with periods of intermittent uneasiness Context: The wound occurred when she hit her leg against an object Modifying Factors: patient had sutures applied on the initial encounter, was removed and wound dehisced Associated Signs and Symptoms: Patient reports presence of swelling This is a 75 year old African American female who presented today to the wound clinic for further evaluation of a non-healing ulcer which resulted form trauma. She is alert and oriented x 4, pleasant and seemingly good historian. Appears calm and no acute distress noted. Her history includes but not limited to hypertension,  Angina Pectoris, CAD, fibromyalgia. Wears glasses, ambulates with a cane. Wound debrided during this visit, Will use aquacel ag and bordered foam dressing. Will see patient back on a weekly basis. Patient verbalized understanding of instructions. 11/18/2015 -- the patient does not have any major problems except for hypertension, coronary artery disease with cardiac cath done in November 2012 and a normal ejection fraction at 55%, iron deficiency anemia, chronic kidney disease stage II, venous stasis, fibromyalgia, and has had some left ankle surgery ( March this year) and in the remote past(2014) has had venous duplex studies done. was a DVT study and was negative for DVT. she has never been a smoker. 01/06/16: pt returns today after a several week absence. amazingly, her wound is improving with  good epithelialization. she denies systemic s/s of infection. no new wounds or skin breakdown. no interval changes regarding health status. 02/10/2016 -- the patient is been back to see as after about 3 weeks and says she was unwell with a cold and fever [name] Cooper, [name] Cooper ([id number]) Objective Constitutional Pulse regular. Respirations normal and unlabored. Afebrile. Vitals Time Taken: 10:32 AM, Height: 67 in, Weight: 174 lbs, BMI: 27.2, Temperature: 98.1 F, Pulse: 72 bpm, Respiratory Rate: 16 breaths/min, Blood Pressure: 132/42 mmHg. Eyes Nonicteric. Reactive to light. Ears, Nose, Mouth, and Throat Lips, teeth, and gums WNL.Marland Kitchen Moist mucosa without lesions. Neck supple and nontender. No palpable supraclavicular or cervical adenopathy. Normal sized without goiter. Respiratory WNL. No retractions.. Breath sounds WNL, No rubs, rales, rhonchi, or wheeze.. Cardiovascular Heart rhythm and rate regular, no murmur or gallop.. Pedal Pulses WNL. No clubbing, cyanosis or edema. Lymphatic No adneopathy. No adenopathy. No adenopathy. Musculoskeletal Adexa without tenderness or enlargement.. Digits and nails w/o clubbing, cyanosis, infection, petechiae, ischemia, or inflammatory conditions.Marland Kitchen Psychiatric Judgement and insight Intact.. No evidence of depression, anxiety, or agitation.. General Notes: the wound has healed. Integumentary (Hair, Skin) No suspicious lesions. No crepitus or fluctuance. No peri-wound warmth or erythema. No masses.. Wound #1 status is Healed - Epithelialized. Original cause of wound was Trauma. The wound is located on the Right,Lateral Lower Leg. The wound measures 0cm length x 0cm width x 0cm depth; 0cm^2 area and 0cm^3 volume. The wound is limited to skin breakdown. There is no tunneling or undermining noted. The wound margin is indistinct and nonvisible. There is large (67-100%) granulation within the wound bed. There is no necrotic tissue within the wound bed. The  periwound skin appearance did not exhibit: Callus, Crepitus, Excoriation, Fluctuance, Friable, Induration, Localized Edema, Rash, Scarring, Dry/Scaly, Maceration, Moist, Atrophie Blanche, Cyanosis, Ecchymosis, Hemosiderin Staining, Mottled, Pallor, Rubor, Brauer, Tamarra ([id number]) Erythema. Assessment Active Problems ICD-10 S81.811A - Laceration without foreign body, right lower leg, initial encounter I89.0 - Lymphedema, not elsewhere classified L97.212 - Non-pressure chronic ulcer of right calf with fat layer exposed the wound has healed and I have discharged her from the wound care services and asked her to continue to wear her compression stockings. Plan Discharge From [facility] Services: Discharge from Wound Care Center the wound has healed and I have discharged her from the wound care services and asked her to continue to wear her compression stockings. Electronic Signature(s) Signed: 02/10/2016 10:49:30 AM By: Evlyn Kanner MD, FACS Entered By: Evlyn Kanner on 02/10/2016 10:49:30 [name] Cooper ([id number]) -------------------------------------------------------------------------------- SuperBill Details Patient Name: [name] Cooper Date of Service: 02/10/2016 Medical Record Number: [id number] Patient Account Number: [id number] Date of Birth/Sex: [birth year] (75 y.o. Female) Treating RN: Huel Coventry Primary Care Physician: [name] Other Clinician: Referring Physician: Burnett Sheng,  Fayrene FearingJames Treating Physician/Extender: Rudene ReBritto, Cassiel Fernandez Weeks in Treatment: 13 Diagnosis Coding ICD-10 Codes Code Description [phone] Laceration without foreign body, right lower leg, initial encounter I89.0 Lymphedema, not elsewhere classified L97.212 Non-pressure chronic ulcer of right calf with fat layer exposed Facility Procedures CPT4 Code: [id number] Description: [id number] - WOUND CARE VISIT-LEV 2 EST PT Modifier: Quantity: 1 Physician Procedures CPT4 Code Description: [id number] - WC PHYS  LEVEL 2 - EST PT ICD-10 Description Diagnosis S81.811A Laceration without foreign body, right lower leg, init I89.0 Lymphedema, not elsewhere classified L97.212 Non-pressure chronic ulcer of right calf with  fat laye Modifier: ial encounte r exposed Quantity: 1 r Electronic Signature(s) Signed: 02/10/2016 4:17:50 PM By: Evlyn KannerBritto, Uliana Brinker MD, FACS Signed: 02/10/2016 5:15:40 PM By: Elliot GurneyWoody, RN, BSN, Kim RN, BSN Previous Signature: 02/10/2016 10:49:44 AM Version By: Evlyn KannerBritto, Ettamae Barkett MD, FACS Entered By: Elliot GurneyWoody, RN, BSN, Kim on 02/10/2016 12:02:22

## 2016-02-12 NOTE — Progress Notes (Signed)
[name] Cooper ([id number]) Visit Report for 02/10/2016 Arrival Information Details Patient Name: [name] Cooper, [name] Cooper Date of Service: 02/10/2016 10:15 AM Medical Record Number: [id number] Patient Account Number: [id number] Date of Birth/Sex: 1941/02/17 (75 y.o. Female) Treating RN: Huel Coventry Primary Care Physician: [name] Other Clinician: Referring Physician: [name] Treating Physician/Extender: Rudene Re in Treatment: 13 Visit Information History Since Last Visit Added or deleted any medications: No Patient Arrived: Ambulatory Any new allergies or adverse reactions: No Arrival Time: 10:32 Had a fall or experienced change in No Accompanied By: self activities of daily living that may affect Transfer Assistance: None risk of falls: Patient Identification Verified: Yes Signs or symptoms of abuse/neglect since last No Secondary Verification Process Yes visito Completed: Hospitalized since last visit: No Patient Requires Transmission-Based No Has Dressing in Place as Prescribed: Yes Precautions: Pain Present Now: No Patient Has Alerts: Yes Patient Alerts: Left ABI 1.13 Electronic Signature(s) Signed: 02/10/2016 5:15:40 PM By: Elliot Gurney, RN, BSN, Kim RN, BSN Entered By: Elliot Gurney, RN, BSN, Kim on 02/10/2016 10:32:49 [name] Cooper ([id number]) -------------------------------------------------------------------------------- Clinic Level of Care Assessment Details Patient Name: [name] Cooper Date of Service: 02/10/2016 10:15 AM Medical Record Number: [id number] Patient Account Number: [id number] Date of Birth/Sex: 08-11-1940 (75 y.o. Female) Treating RN: Huel Coventry Primary Care Physician: [name] Other Clinician: Referring Physician: [name] Treating Physician/Extender: Rudene Re in Treatment: 13 Clinic Level of Care Assessment Items TOOL 4 Quantity Score []  - Use when only an EandM is performed on FOLLOW-UP visit 0 ASSESSMENTS  - Nursing Assessment / Reassessment []  - Reassessment of Co-morbidities (includes updates in patient status) 0 X - Reassessment of Adherence to Treatment Plan 1 5 ASSESSMENTS - Wound and Skin Assessment / Reassessment X - Simple Wound Assessment / Reassessment - one wound 1 5 []  - Complex Wound Assessment / Reassessment - multiple wounds 0 []  - Dermatologic / Skin Assessment (not related to wound area) 0 ASSESSMENTS - Focused Assessment []  - Circumferential Edema Measurements - multi extremities 0 []  - Nutritional Assessment / Counseling / Intervention 0 []  - Lower Extremity Assessment (monofilament, tuning fork, pulses) 0 []  - Peripheral Arterial Disease Assessment (using hand held doppler) 0 ASSESSMENTS - Ostomy and/or Continence Assessment and Care []  - Incontinence Assessment and Management 0 []  - Ostomy Care Assessment and Management (repouching, etc.) 0 PROCESS - Coordination of Care X - Simple Patient / Family Education for ongoing care 1 15 []  - Complex (extensive) Patient / Family Education for ongoing care 0 []  - Staff obtains Chiropractor, Records, Test Results / Process Orders 0 []  - Staff telephones HHA, Nursing Homes / Clarify orders / etc 0 []  - Routine Transfer to another Facility (non-emergent condition) 0 [place], Yurika ([id number]) []  - Routine Hospital Admission (non-emergent condition) 0 []  - New Admissions / Manufacturing engineer / Ordering NPWT, Apligraf, etc. 0 []  - Emergency Hospital Admission (emergent condition) 0 X - Simple Discharge Coordination 1 10 []  - Complex (extensive) Discharge Coordination 0 PROCESS - Special Needs []  - Pediatric / Minor Patient Management 0 []  - Isolation Patient Management 0 []  - Hearing / Language / Visual special needs 0 []  - Assessment of Community assistance (transportation, D/C planning, etc.) 0 []  - Additional assistance / Altered mentation 0 []  - Support Surface(s) Assessment (bed, cushion, seat, etc.) 0 INTERVENTIONS -  Wound Cleansing / Measurement X - Simple Wound Cleansing - one wound 1 5 []  - Complex Wound Cleansing - multiple wounds 0 X - Wound Imaging (photographs - any number of wounds)  1 5 []  - Wound Tracing (instead of photographs) 0 X - Simple Wound Measurement - one wound 1 5 []  - Complex Wound Measurement - multiple wounds 0 INTERVENTIONS - Wound Dressings []  - Small Wound Dressing one or multiple wounds 0 []  - Medium Wound Dressing one or multiple wounds 0 []  - Large Wound Dressing one or multiple wounds 0 []  - Application of Medications - topical 0 []  - Application of Medications - injection 0 INTERVENTIONS - Miscellaneous []  - External ear exam 0 Gelles, Suha ([id number]) []  - Specimen Collection (cultures, biopsies, blood, body fluids, etc.) 0 []  - Specimen(s) / Culture(s) sent or taken to Lab for analysis 0 []  - Patient Transfer (multiple staff / Michiel Sites Lift / Similar devices) 0 []  - Simple Staple / Suture removal (25 or less) 0 []  - Complex Staple / Suture removal (26 or more) 0 []  - Hypo / Hyperglycemic Management (close monitor of Blood Glucose) 0 []  - Ankle / Brachial Index (ABI) - do not check if billed separately 0 X - Vital Signs 1 5 Has the patient been seen at the hospital within the last three years: Yes Total Score: 55 Level Of Care: New/Established - Level 2 Electronic Signature(s) Signed: 02/10/2016 5:15:40 PM By: Elliot Gurney, RN, BSN, Kim RN, BSN Entered By: Elliot Gurney, RN, BSN, Kim on 02/10/2016 10:51:47 [name] Cooper ([id number]) -------------------------------------------------------------------------------- Encounter Discharge Information Details Patient Name: [name] Cooper Date of Service: 02/10/2016 10:15 AM Medical Record Number: [id number] Patient Account Number: [id number] Date of Birth/Sex: Mar 03, 1941 (75 y.o. Female) Treating RN: Huel Coventry Primary Care Physician: [name] Other Clinician: Referring Physician: [name] Treating  Physician/Extender: Rudene Re in Treatment: 38 Encounter Discharge Information Items Discharge Pain Level: 0 Discharge Condition: Stable Ambulatory Status: Ambulatory Discharge Destination: Home Transportation: Private Auto Accompanied By: self Schedule Follow-up Appointment: Yes Medication Reconciliation completed and provided to Patient/Care Yes Husam Hohn: Provided on Clinical Summary of Care: 02/10/2016 Form Type Recipient Paper Patient DB Electronic Signature(s) Signed: 02/10/2016 5:15:40 PM By: Elliot Gurney, RN, BSN, Kim RN, BSN Previous Signature: 02/10/2016 10:48:45 AM Version By: Gwenlyn Perking Entered By: Elliot Gurney RN, BSN, Kim on 02/10/2016 10:52:17 [name] Cooper ([id number]) -------------------------------------------------------------------------------- Lower Extremity Assessment Details Patient Name: [name] Cooper Date of Service: 02/10/2016 10:15 AM Medical Record Number: [id number] Patient Account Number: [id number] Date of Birth/Sex: Jan 29, 1941 (75 y.o. Female) Treating RN: Huel Coventry Primary Care Physician: [name] Other Clinician: Referring Physician: [name] Treating Physician/Extender: Rudene Re in Treatment: 13 Edema Assessment Assessed: Kyra Searles: No] Franne Forts: Yes] E[Left: dema] [Right: :] Calf Left: Right: Point of Measurement: 35 cm From Medial Instep cm 37 cm Ankle Left: Right: Point of Measurement: 9 cm From Medial Instep cm 23.5 cm Vascular Assessment Pulses: Posterior Tibial Dorsalis Pedis Palpable: [Right:Yes] Extremity colors, hair growth, and conditions: Extremity Color: [Right:Normal] Hair Growth on Extremity: [Right:No] Temperature of Extremity: [Right:Warm] Capillary Refill: [Right:< 3 seconds] Dependent Rubor: [Right:No] Blanched when Elevated: [Right:No] Lipodermatosclerosis: [Right:No] Toe Nail Assessment Left: Right: Thick: No Discolored: No Deformed: No Improper Length and Hygiene: No Electronic  Signature(s) Signed: 02/10/2016 5:15:40 PM By: Elliot Gurney, RN, BSN, Kim RN, BSN Melvern, Mariangel ([id number]) Entered By: Elliot Gurney, RN, BSN, Kim on 02/10/2016 10:35:12 [name] Cooper ([id number]) -------------------------------------------------------------------------------- Multi Wound Chart Details Patient Name: [name] Cooper Date of Service: 02/10/2016 10:15 AM Medical Record Number: [id number] Patient Account Number: [id number] Date of Birth/Sex: 1940/07/22 (75 y.o. Female) Treating RN: Huel Coventry Primary Care Physician: [name] Other Clinician: Referring Physician: [name] Treating Physician/Extender: Rudene Re in Treatment: [phone]  Vital Signs Height(in): 67 Pulse(bpm): 72 Weight(lbs): 174 Blood Pressure 132/42 (mmHg): Body Mass Index(BMI): 27 Temperature(F): 98.1 Respiratory Rate 16 (breaths/min): Photos: [N/A:N/A] Wound Location: Right Lower Leg - Lateral N/A N/A Wounding Event: Trauma N/A N/A Primary Etiology: Dehisced Wound N/A N/A Secondary Etiology: Lymphedema N/A N/A Comorbid History: Anemia, Arrhythmia, N/A N/A Hypertension, Osteoarthritis Date Acquired: 10/05/2015 N/A N/A Weeks of Treatment: 13 N/A N/A Wound Status: Healed - Epithelialized N/A N/A Measurements L x W x D 0x0x0 N/A N/A (cm) Area (cm) : 0 N/A N/A Volume (cm) : 0 N/A N/A % Reduction in Area: 100.00% N/A N/A % Reduction in Volume: 100.00% N/A N/A Classification: Full Thickness Without N/A N/A Exposed Support Structures Wound Margin: Indistinct, nonvisible N/A N/A Granulation Amount: Large (67-100%) N/A N/A Necrotic Amount: None Present (0%) N/A N/A Exposed Structures: Fascia: No N/A N/A Fat: No [name] LittenBURRELL, Kirstein ([id number]) Tendon: No Muscle: No Joint: No Bone: No Limited to Skin Breakdown Epithelialization: Large (67-100%) N/A N/A Periwound Skin Texture: Edema: No N/A N/A Excoriation: No Induration: No Callus: No Crepitus: No Fluctuance: No Friable:  No Rash: No Scarring: No Periwound Skin Maceration: No N/A N/A Moisture: Moist: No Dry/Scaly: No Periwound Skin Color: Atrophie Blanche: No N/A N/A Cyanosis: No Ecchymosis: No Erythema: No Hemosiderin Staining: No Mottled: No Pallor: No Rubor: No Tenderness on No N/A N/A Palpation: Wound Preparation: Ulcer Cleansing: N/A N/A Rinsed/Irrigated with Saline Topical Anesthetic Applied: None Treatment Notes Electronic Signature(s) Signed: 02/10/2016 5:15:40 PM By: Elliot GurneyWoody, RN, BSN, Kim RN, BSN Entered By: Elliot GurneyWoody, RN, BSN, Kim on 02/10/2016 10:51:09 [name] LittenBURRELL, Christon ([id number]) -------------------------------------------------------------------------------- Multi-Disciplinary Care Plan Details Patient Name: [name] LittenBURRELL, Allayah Date of Service: 02/10/2016 10:15 AM Medical Record Number: [id number] Patient Account Number: [id number] Date of Birth/Sex: 19-Dec-1940 (75 y.o. Female) Treating RN: Huel CoventryWoody, Kim Primary Care Physician: Jerl MinaHedrick, James Other Clinician: Referring Physician: Jerl MinaHedrick, James Treating Physician/Extender: Rudene ReBritto, Errol Weeks in Treatment: 13 Active Inactive Electronic Signature(s) Signed: 02/10/2016 5:15:40 PM By: Elliot GurneyWoody, RN, BSN, Kim RN, BSN Entered By: Elliot GurneyWoody, RN, BSN, Kim on 02/10/2016 10:50:00 [name] LittenBURRELL, Unika ([id number]) -------------------------------------------------------------------------------- Pain Assessment Details Patient Name: [name] LittenBURRELL, Tayler Date of Service: 02/10/2016 10:15 AM Medical Record Number: [id number] Patient Account Number: [id number] Date of Birth/Sex: 19-Dec-1940 (75 y.o. Female) Treating RN: Huel CoventryWoody, Kim Primary Care Physician: Jerl MinaHedrick, James Other Clinician: Referring Physician: Jerl MinaHedrick, James Treating Physician/Extender: Rudene ReBritto, Errol Weeks in Treatment: 13 Active Problems Location of Pain Severity and Description of Pain Patient Has Paino No Site Locations With Dressing Change: No Pain Management and Medication Current Pain  Management: Electronic Signature(s) Signed: 02/10/2016 5:15:40 PM By: Elliot GurneyWoody, RN, BSN, Kim RN, BSN Entered By: Elliot GurneyWoody, RN, BSN, Kim on 02/10/2016 10:32:56 [name] LittenBURRELL, Vonzella ([id number]) -------------------------------------------------------------------------------- Patient/Caregiver Education Details Patient Name: [name] LittenBURRELL, Liv Date of Service: 02/10/2016 10:15 AM Medical Record Number: [id number] Patient Account Number: [id number] Date of Birth/Gender: 19-Dec-1940 (75 y.o. Female) Treating RN: Huel CoventryWoody, Kim Primary Care Physician: Jerl MinaHedrick, James Other Clinician: Referring Physician: Jerl MinaHedrick, James Treating Physician/Extender: Rudene ReBritto, Errol Weeks in Treatment: 13 Education Assessment Education Provided To: Patient Education Topics Provided Wound/Skin Impairment: Handouts: Caring for Your Ulcer, Other: lotion Methods: Explain/Verbal Responses: State content correctly Electronic Signature(s) Signed: 02/10/2016 5:15:40 PM By: Elliot GurneyWoody, RN, BSN, Kim RN, BSN Entered By: Elliot GurneyWoody, RN, BSN, Kim on 02/10/2016 10:52:51 [name] LittenBURRELL, Tyshawna ([id number]) -------------------------------------------------------------------------------- Wound Assessment Details Patient Name: [name] LittenBURRELL, Neely Date of Service: 02/10/2016 10:15 AM Medical Record Number: [id number] Patient Account Number: [id number] Date of Birth/Sex: 19-Dec-1940 (75 y.o. Female) Treating RN: Huel CoventryWoody, Kim Primary Care Physician: Jerl MinaHedrick, James Other Clinician: Referring Physician: Burnett ShengHedrick,  Fayrene FearingJames Treating Physician/Extender: Rudene ReBritto, Errol Weeks in Treatment: 13 Wound Status Wound Number: 1 Primary Dehisced Wound Etiology: Wound Location: Right Lower Leg - Lateral Secondary Lymphedema Wounding Event: Trauma Etiology: Date Acquired: 10/05/2015 Wound Status: Healed - Epithelialized Weeks Of Treatment: 13 Comorbid Anemia, Arrhythmia, Hypertension, Clustered Wound: No History: Osteoarthritis Photos Photo Uploaded By: Elliot GurneyWoody, RN, BSN, Kim on  02/10/2016 10:50:45 Wound Measurements Length: (cm) Width: (cm) Depth: (cm) Area: (cm) Volume: (cm) 0 % Reduction in Area: 100% 0 % Reduction in Volume: 100% 0 Epithelialization: Large (67-100%) 0 Tunneling: No 0 Undermining: No Wound Description Full Thickness Without Exposed Classification: Support Structures Wound Indistinct, nonvisible Margin: Wound Bed Granulation Amount: Large (67-100%) Exposed Structure Necrotic Amount: None Present (0%) Fascia Exposed: No Fat Layer Exposed: No Tendon Exposed: No Muscle Exposed: No Joint Exposed: No Bone Exposed: No Fruge, Manha ([id number]) Limited to Skin Breakdown Periwound Skin Texture Texture Color No Abnormalities Noted: No No Abnormalities Noted: No Callus: No Atrophie Blanche: No Crepitus: No Cyanosis: No Excoriation: No Ecchymosis: No Fluctuance: No Erythema: No Friable: No Hemosiderin Staining: No Induration: No Mottled: No Localized Edema: No Pallor: No Rash: No Rubor: No Scarring: No Moisture No Abnormalities Noted: No Dry / Scaly: No Maceration: No Moist: No Wound Preparation Ulcer Cleansing: Rinsed/Irrigated with Saline Topical Anesthetic Applied: None Electronic Signature(s) Signed: 02/10/2016 5:15:40 PM By: Elliot GurneyWoody, RN, BSN, Kim RN, BSN Entered By: Elliot GurneyWoody, RN, BSN, Kim on 02/10/2016 10:47:21 [name] LittenBURRELL, Christa ([id number]) -------------------------------------------------------------------------------- Vitals Details Patient Name: [name] LittenBURRELL, Aubriana Date of Service: 02/10/2016 10:15 AM Medical Record Number: [id number] Patient Account Number: [id number] Date of Birth/Sex: 1940-10-08 (75 y.o. Female) Treating RN: Huel CoventryWoody, Kim Primary Care Physician: Jerl MinaHedrick, James Other Clinician: Referring Physician: Jerl MinaHedrick, James Treating Physician/Extender: Rudene ReBritto, Errol Weeks in Treatment: 13 Vital Signs Time Taken: 10:32 Temperature (F): 98.1 Height (in): 67 Pulse (bpm): 72 Weight (lbs):  174 Respiratory Rate (breaths/min): 16 Body Mass Index (BMI): 27.2 Blood Pressure (mmHg): 132/42 Reference Range: 80 - 120 mg / dl Electronic Signature(s) Signed: 02/10/2016 5:15:40 PM By: Elliot GurneyWoody, RN, BSN, Kim RN, BSN Entered By: Elliot GurneyWoody, RN, BSN, Kim on 02/10/2016 10:33:16

## 2016-02-21 ENCOUNTER — Inpatient Hospital Stay: Payer: Medicare Other

## 2016-04-30 NOTE — Progress Notes (Signed)
Hematology/Oncology Consult note [facility]  Telephone:([phone] Fax:([phone]  Patient Care Team: [name], MD as PCP - General (Family Medicine) Colleen Can, MD as Referring Physician (Orthopedic Surgery) Minna Merritts, MD as Consulting Physician (Cardiology)   Name of the patient: [name] Cooper  [id number]  1940/12/20   Date of visit: 04/30/16  Diagnosis- anemia of chronic disease likely from CKD  Chief complaint/ Reason for visit- routine f/u of anemia  Heme/Onc history: Patient is a 76 year old female who was seen by Dr. Sherrine Maples in the past for evaluation of anemia. Her hemoglobin has been ranging between 9.9-10.8 over the last couple of years. She is also noted to have chronic kidney disease with a creatinine ranging from 1.5-2. 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   Review of blood work from October 2017 was as follows:  methylmalonic acid was elevated at 778. TIBC was low and ferritin was elevated at 399. spep showed polyclonal gammopathy. Cbc showed white count of 9.2, H&H of 10.1/30.6 and a platelet count of 297. Reticulocyte count was low at 0.8 in appropriate for the degree of anemia. Normal immunofixation pattern was noted in urine  Patient was started on monthly B12 injections given elevated MMA  Interval history- she is doing well. Denies any complaints  ECOG PS- 1 Pain scale- 0 Opioid associated constipation- no  Review of systems- Review of Systems  Constitutional: Negative for chills, fever, malaise/fatigue and weight loss.  HENT: Negative for congestion, ear discharge and nosebleeds.   Eyes: Negative for blurred vision.  Respiratory: Negative for cough, hemoptysis, sputum production, shortness of breath and wheezing.   Cardiovascular: Negative for chest pain, palpitations, orthopnea and claudication.  Gastrointestinal:  Negative for abdominal pain, blood in stool, constipation, diarrhea, heartburn, melena, nausea and vomiting.  Genitourinary: Negative for dysuria, flank pain, frequency, hematuria and urgency.  Musculoskeletal: Negative for back pain, joint pain and myalgias.  Skin: Negative for rash.  Neurological: Negative for dizziness, tingling, focal weakness, seizures, weakness and headaches.  Endo/Heme/Allergies: Does not bruise/bleed easily.  Psychiatric/Behavioral: Negative for depression and suicidal ideas. The patient does not have insomnia.      Current treatment- home B 12 injections  Allergies  Allergen Reactions  . Sulfa Antibiotics Anaphylaxis     Past Medical History:  Diagnosis Date  . Arthritis    left hip, left ankle  . Cholelithiases   . CKD (chronic kidney disease), stage II   . Coronary artery disease    a. cardiac cath 01/2011: mid LAD stent patent, mild lumenal irreg's, nl EF 55%, no AS/MR  . Fibromyalgia   . Frozen shoulder   . History of kidney stones   . HTN (hypertension)    controlled with medication;   . Hyperlipidemia    a. statin intolerant  . Iron deficiency   . Thrombocytopenia ([place])   . UTI (urinary tract infection)   . Venous stasis      Past Surgical History:  Procedure Laterality Date  . CARDIAC CATHETERIZATION     stents placed in mid LAD  . CERVICAL SPINE SURGERY    . FETAL BLOOD TRANSFUSION  2014  . Frozen Shoulder    . KIDNEY STONE SURGERY    . LEG SURGERY     broke her leg     Social History   Social History  . Marital status: Widowed    Spouse  name: N/A  . Number of children: N/A  . Years of education: N/A   Occupational History  . Not on file.   Social History Main Topics  . Smoking status: Never Smoker  . Smokeless tobacco: Never Used  . Alcohol use No  . Drug use: No  . Sexual activity: Not on file   Other Topics Concern  . Not on file   Social History Narrative  . No narrative on file    Family History    Problem Relation Age of Onset  . Hypertension Mother   . Heart attack Mother   . Heart attack Father   . Heart disease Brother     h/o CABG  . Diabetes Brother   . Kidney disease Brother      Current Outpatient Prescriptions:  .  Acetaminophen (TYLENOL 8 HOUR PO), Take by mouth., Disp: , Rfl:  .  aspirin EC 81 MG tablet, Take 81 mg by mouth every other day. , Disp: 90 tablet, Rfl: 3 .  conjugated estrogens (PREMARIN) vaginal cream, Place 1 Applicatorful vaginally daily. Patient was given a sample of vaginal estrogen cream and instructed to apply 0.20m (pea-sized amount)  just inside the vaginal introitus with a finger-tip every night for two weeks and then Monday, Wednesday and Friday nights., Disp: 42.5 g, Rfl: 12 .  Cyanocobalamin 1000 MCG/ML KIT, Inject 1000 mcg subcutaneously once a week for 4 doses, then switch to once a month, to continue indefinitely, Disp: 10 kit, Rfl: 3 .  losartan-hydrochlorothiazide (HYZAAR) 100-25 MG tablet, take 1 tablet by mouth once daily, Disp: 90 tablet, Rfl: 3 .  zolpidem (AMBIEN) 10 MG tablet, Take 10 mg by mouth at bedtime as needed.  , Disp: , Rfl:   Physical exam:  Vitals:   05/01/16 0933  BP: (!) 148/64  Pulse: 61  Temp: 97 F (36.1 C)  TempSrc: Tympanic  Weight: 177 lb 11.1 oz (80.6 kg)  Height: 5' 7" (1.702 m)   Physical Exam  Constitutional: She is oriented to person, place, and time and well-developed, well-nourished, and in no distress.  HENT:  Head: Normocephalic and atraumatic.  Eyes: EOM are normal. Pupils are equal, round, and reactive to light.  Neck: Normal range of motion.  Cardiovascular: Normal rate and regular rhythm.   Systolic murmur +  Pulmonary/Chest: Effort normal and breath sounds normal.  Abdominal: Soft. Bowel sounds are normal.  Neurological: She is alert and oriented to person, place, and time.  Skin: Skin is warm and dry.     CMP Latest Ref Rng & Units 05/01/2016  Glucose 65 - 99 mg/dL 127(H)  BUN 6 - 20  mg/dL 25(H)  Creatinine 0.44 - 1.00 mg/dL 1.38(H)  Sodium 135 - 145 mmol/L 139  Potassium 3.5 - 5.1 mmol/L 3.5  Chloride 101 - 111 mmol/L 106  CO2 22 - 32 mmol/L 27  Calcium 8.9 - 10.3 mg/dL 9.1  Total Protein 6.5 - 8.1 g/dL 7.6  Total Bilirubin 0.3 - 1.2 mg/dL 0.3  Alkaline Phos 38 - 126 U/L 121  AST 15 - 41 U/L 32  ALT 14 - 54 U/L 17   CBC Latest Ref Rng & Units 05/01/2016  WBC 3.6 - 11.0 K/uL 7.5  Hemoglobin 12.0 - 16.0 g/dL 9.9(L)  Hematocrit 35.0 - 47.0 % 30.2(L)  Platelets 150 - 440 K/uL 270    Assessment and plan- Patient is a 76y.o. female with anemia likely secondary to anemia of chronic disease and/or chronic kidney disease  1. Anemia of chronic kidney disease- Her H/H has not improved since last visit. It is stable. Prior work up did not reveal any other cause of anemia other than elevated MMA and CKD. At this point I am inclined to monitor her anemia conservatively. Should I drift down any further I will consider EPO shots. She is to continue her monthly B12 injections. I will see her back in 3 months   Visit Diagnosis 1. Anemia of chronic disease      Dr. Randa Evens, MD, MPH [facility] Pager- [phone] 05/01/2016 10:27 AM

## 2016-05-01 ENCOUNTER — Inpatient Hospital Stay: Payer: Medicare Other | Attending: Oncology | Admitting: Oncology

## 2016-05-01 ENCOUNTER — Inpatient Hospital Stay: Payer: Medicare Other

## 2016-05-01 VITALS — BP 148/64 | HR 61 | Temp 97.0°F | Ht 67.0 in | Wt 177.7 lb

## 2016-05-01 DIAGNOSIS — D638 Anemia in other chronic diseases classified elsewhere: Secondary | ICD-10-CM

## 2016-05-01 DIAGNOSIS — D890 Polyclonal hypergammaglobulinemia: Secondary | ICD-10-CM | POA: Insufficient documentation

## 2016-05-01 DIAGNOSIS — I129 Hypertensive chronic kidney disease with stage 1 through stage 4 chronic kidney disease, or unspecified chronic kidney disease: Secondary | ICD-10-CM

## 2016-05-01 DIAGNOSIS — Z7982 Long term (current) use of aspirin: Secondary | ICD-10-CM | POA: Insufficient documentation

## 2016-05-01 DIAGNOSIS — Z79899 Other long term (current) drug therapy: Secondary | ICD-10-CM | POA: Diagnosis not present

## 2016-05-01 DIAGNOSIS — M797 Fibromyalgia: Secondary | ICD-10-CM | POA: Diagnosis not present

## 2016-05-01 DIAGNOSIS — N182 Chronic kidney disease, stage 2 (mild): Secondary | ICD-10-CM | POA: Diagnosis not present

## 2016-05-01 DIAGNOSIS — I2510 Atherosclerotic heart disease of native coronary artery without angina pectoris: Secondary | ICD-10-CM | POA: Diagnosis not present

## 2016-05-01 DIAGNOSIS — D631 Anemia in chronic kidney disease: Secondary | ICD-10-CM

## 2016-05-01 DIAGNOSIS — E785 Hyperlipidemia, unspecified: Secondary | ICD-10-CM | POA: Diagnosis not present

## 2016-05-01 LAB — CBC WITH DIFFERENTIAL/PLATELET
BASOS ABS: 0 10*3/uL (ref 0–0.1)
BASOS PCT: 1 %
EOS PCT: 4 %
Eosinophils Absolute: 0.3 10*3/uL (ref 0–0.7)
HEMATOCRIT: 30.2 % — AB (ref 35.0–47.0)
Hemoglobin: 9.9 g/dL — ABNORMAL LOW (ref 12.0–16.0)
Lymphocytes Relative: 25 %
Lymphs Abs: 1.9 10*3/uL (ref 1.0–3.6)
MCH: 27.2 pg (ref 26.0–34.0)
MCHC: 32.9 g/dL (ref 32.0–36.0)
MCV: 82.7 fL (ref 80.0–100.0)
MONO ABS: 0.5 10*3/uL (ref 0.2–0.9)
Monocytes Relative: 7 %
Neutro Abs: 4.8 10*3/uL (ref 1.4–6.5)
Neutrophils Relative %: 63 %
PLATELETS: 270 10*3/uL (ref 150–440)
RBC: 3.65 MIL/uL — ABNORMAL LOW (ref 3.80–5.20)
RDW: 13.7 % (ref 11.5–14.5)
WBC: 7.5 10*3/uL (ref 3.6–11.0)

## 2016-05-01 LAB — COMPREHENSIVE METABOLIC PANEL
ALBUMIN: 3.6 g/dL (ref 3.5–5.0)
ALT: 17 U/L (ref 14–54)
ANION GAP: 6 (ref 5–15)
AST: 32 U/L (ref 15–41)
Alkaline Phosphatase: 121 U/L (ref 38–126)
BILIRUBIN TOTAL: 0.3 mg/dL (ref 0.3–1.2)
BUN: 25 mg/dL — AB (ref 6–20)
CHLORIDE: 106 mmol/L (ref 101–111)
CO2: 27 mmol/L (ref 22–32)
Calcium: 9.1 mg/dL (ref 8.9–10.3)
Creatinine, Ser: 1.38 mg/dL — ABNORMAL HIGH (ref 0.44–1.00)
GFR calc Af Amer: 42 mL/min — ABNORMAL LOW (ref >60)
GFR, EST NON AFRICAN AMERICAN: 36 mL/min — AB (ref >60)
Glucose, Bld: 127 mg/dL — ABNORMAL HIGH (ref 65–99)
POTASSIUM: 3.5 mmol/L (ref 3.5–5.1)
Sodium: 139 mmol/L (ref 135–145)
TOTAL PROTEIN: 7.6 g/dL (ref 6.5–8.1)

## 2016-05-01 LAB — VITAMIN B12: VITAMIN B 12: 994 pg/mL — AB (ref 180–914)

## 2016-05-01 NOTE — Progress Notes (Signed)
Patient here for follow no changes since last appointment. She is still cold denies tiredness.

## 2016-05-02 ENCOUNTER — Other Ambulatory Visit: Payer: Medicare Other

## 2016-05-02 ENCOUNTER — Ambulatory Visit: Payer: Medicare Other

## 2016-05-08 ENCOUNTER — Other Ambulatory Visit: Payer: Self-pay | Admitting: Orthopedic Surgery

## 2016-05-08 DIAGNOSIS — M25551 Pain in right hip: Secondary | ICD-10-CM

## 2016-05-08 DIAGNOSIS — G8929 Other chronic pain: Secondary | ICD-10-CM

## 2016-05-08 DIAGNOSIS — M25552 Pain in left hip: Principal | ICD-10-CM

## 2016-05-08 DIAGNOSIS — M7062 Trochanteric bursitis, left hip: Secondary | ICD-10-CM

## 2016-05-08 DIAGNOSIS — M7061 Trochanteric bursitis, right hip: Secondary | ICD-10-CM

## 2016-05-24 ENCOUNTER — Other Ambulatory Visit: Payer: Self-pay | Admitting: Cardiovascular Disease

## 2016-05-24 DIAGNOSIS — I6523 Occlusion and stenosis of bilateral carotid arteries: Secondary | ICD-10-CM

## 2016-06-02 ENCOUNTER — Ambulatory Visit
Admission: RE | Admit: 2016-06-02 | Discharge: 2016-06-02 | Disposition: A | Discharge status: Home / Self Care | Payer: Medicare Other | Source: Ambulatory Visit | Attending: Orthopedic Surgery | Admitting: Orthopedic Surgery

## 2016-06-02 DIAGNOSIS — M7062 Trochanteric bursitis, left hip: Secondary | ICD-10-CM | POA: Diagnosis not present

## 2016-06-02 DIAGNOSIS — G8929 Other chronic pain: Secondary | ICD-10-CM | POA: Insufficient documentation

## 2016-06-02 DIAGNOSIS — M7061 Trochanteric bursitis, right hip: Secondary | ICD-10-CM

## 2016-06-02 DIAGNOSIS — M25551 Pain in right hip: Secondary | ICD-10-CM | POA: Insufficient documentation

## 2016-06-02 DIAGNOSIS — M25552 Pain in left hip: Secondary | ICD-10-CM | POA: Diagnosis present

## 2016-07-31 ENCOUNTER — Encounter: Payer: Self-pay | Admitting: Oncology

## 2016-07-31 ENCOUNTER — Inpatient Hospital Stay: Payer: Medicare Other | Attending: Oncology

## 2016-07-31 ENCOUNTER — Inpatient Hospital Stay (HOSPITAL_BASED_OUTPATIENT_CLINIC_OR_DEPARTMENT_OTHER): Payer: Medicare Other | Admitting: Oncology

## 2016-07-31 VITALS — BP 125/71 | HR 62 | Temp 97.8°F | Resp 18 | Ht 67.0 in | Wt 179.1 lb

## 2016-07-31 DIAGNOSIS — I2510 Atherosclerotic heart disease of native coronary artery without angina pectoris: Secondary | ICD-10-CM | POA: Insufficient documentation

## 2016-07-31 DIAGNOSIS — E785 Hyperlipidemia, unspecified: Secondary | ICD-10-CM | POA: Diagnosis not present

## 2016-07-31 DIAGNOSIS — Z7982 Long term (current) use of aspirin: Secondary | ICD-10-CM | POA: Insufficient documentation

## 2016-07-31 DIAGNOSIS — Z79899 Other long term (current) drug therapy: Secondary | ICD-10-CM | POA: Insufficient documentation

## 2016-07-31 DIAGNOSIS — D890 Polyclonal hypergammaglobulinemia: Secondary | ICD-10-CM | POA: Insufficient documentation

## 2016-07-31 DIAGNOSIS — I129 Hypertensive chronic kidney disease with stage 1 through stage 4 chronic kidney disease, or unspecified chronic kidney disease: Secondary | ICD-10-CM | POA: Insufficient documentation

## 2016-07-31 DIAGNOSIS — D631 Anemia in chronic kidney disease: Secondary | ICD-10-CM | POA: Insufficient documentation

## 2016-07-31 DIAGNOSIS — N182 Chronic kidney disease, stage 2 (mild): Secondary | ICD-10-CM | POA: Diagnosis not present

## 2016-07-31 DIAGNOSIS — E538 Deficiency of other specified B group vitamins: Secondary | ICD-10-CM | POA: Diagnosis not present

## 2016-07-31 DIAGNOSIS — D638 Anemia in other chronic diseases classified elsewhere: Secondary | ICD-10-CM

## 2016-07-31 DIAGNOSIS — M797 Fibromyalgia: Secondary | ICD-10-CM | POA: Insufficient documentation

## 2016-07-31 DIAGNOSIS — N189 Chronic kidney disease, unspecified: Principal | ICD-10-CM

## 2016-07-31 LAB — COMPREHENSIVE METABOLIC PANEL
ALK PHOS: 107 U/L (ref 38–126)
ALT: 12 U/L — ABNORMAL LOW (ref 14–54)
ANION GAP: 8 (ref 5–15)
AST: 26 U/L (ref 15–41)
Albumin: 3.6 g/dL (ref 3.5–5.0)
BUN: 36 mg/dL — ABNORMAL HIGH (ref 6–20)
CALCIUM: 9.2 mg/dL (ref 8.9–10.3)
CO2: 26 mmol/L (ref 22–32)
Chloride: 102 mmol/L (ref 101–111)
Creatinine, Ser: 1.44 mg/dL — ABNORMAL HIGH (ref 0.44–1.00)
GFR calc non Af Amer: 35 mL/min — ABNORMAL LOW (ref >60)
GFR, EST AFRICAN AMERICAN: 40 mL/min — AB (ref >60)
Glucose, Bld: 126 mg/dL — ABNORMAL HIGH (ref 65–99)
Potassium: 3.3 mmol/L — ABNORMAL LOW (ref 3.5–5.1)
SODIUM: 136 mmol/L (ref 135–145)
Total Bilirubin: 0.6 mg/dL (ref 0.3–1.2)
Total Protein: 7.8 g/dL (ref 6.5–8.1)

## 2016-07-31 LAB — FERRITIN: FERRITIN: 428 ng/mL — AB (ref 11–307)

## 2016-07-31 LAB — CBC
HCT: 28.8 % — ABNORMAL LOW (ref 35.0–47.0)
HEMOGLOBIN: 9.7 g/dL — AB (ref 12.0–16.0)
MCH: 27.6 pg (ref 26.0–34.0)
MCHC: 33.6 g/dL (ref 32.0–36.0)
MCV: 82.2 fL (ref 80.0–100.0)
Platelets: 294 10*3/uL (ref 150–440)
RBC: 3.51 MIL/uL — ABNORMAL LOW (ref 3.80–5.20)
RDW: 13 % (ref 11.5–14.5)
WBC: 8.6 10*3/uL (ref 3.6–11.0)

## 2016-07-31 LAB — VITAMIN B12: Vitamin B-12: 1460 pg/mL — ABNORMAL HIGH (ref 180–914)

## 2016-07-31 LAB — IRON AND TIBC
IRON: 80 ug/dL (ref 28–170)
Saturation Ratios: 33 % — ABNORMAL HIGH (ref 10.4–31.8)
TIBC: 240 ug/dL — ABNORMAL LOW (ref 250–450)
UIBC: 160 ug/dL

## 2016-07-31 MED ORDER — VITAMIN B-12 1000 MCG PO TABS: 1000.0000 ug | ORAL_TABLET | Freq: Every day | ORAL | 5 refills | Status: DC

## 2016-07-31 NOTE — Progress Notes (Signed)
Hematology/Oncology Consult note [facility]  Telephone:([phone] [phone]  Patient Care Team: [name], MD as PCP - General (Family Medicine) Diona Browner Paulino Door, MD as Referring Physician (Orthopedic Surgery) Minna Merritts, MD as Consulting Physician (Cardiology)   Name of the patient: [name] Cooper  [id number]  1940/07/24   Date of visit: 07/31/16  Diagnosis- anemia of chronic kidney disease  b12 deficiency  Chief complaint/ Reason for visit- routine f/u  Heme/Onc history: Patient is a 76 year old female who was seen by Dr. Sherrine Maples in the past for evaluation of anemia. Her hemoglobin has been ranging between 9.9-10.8 over the last couple of years. She is also noted to have chronic kidney disease with a creatinine ranging from 1.5-2. 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   Review of blood work from October 2017 was as follows:  methylmalonic acid was elevated at 778. TIBC was low and ferritin was elevated at 399. spep showed polyclonal gammopathy. Cbc showed white count of 9.2, H&H of 10.1/30.6 and a platelet count of 297. Reticulocyte count was low at 0.8 in appropriate for the degree of anemia. Normal immunofixation pattern was noted in urine  Patient was started on monthly B12 injections given elevated MMA   Interval history- reports problems with smell sensation and will be seeing ENT soon. Otherwise feels well. Denies fatigue or other complaints   Review of systems- Review of Systems  Constitutional: Negative for chills, fever, malaise/fatigue and weight loss.  HENT: Negative for congestion, ear discharge and nosebleeds.   Eyes: Negative for blurred vision.  Respiratory: Negative for cough, hemoptysis, sputum production, shortness of breath and wheezing.   Cardiovascular: Negative for chest pain, palpitations, orthopnea and claudication.    Gastrointestinal: Negative for abdominal pain, blood in stool, constipation, diarrhea, heartburn, melena, nausea and vomiting.  Genitourinary: Negative for dysuria, flank pain, frequency, hematuria and urgency.  Musculoskeletal: Negative for back pain, joint pain and myalgias.  Skin: Negative for rash.  Neurological: Negative for dizziness, tingling, focal weakness, seizures, weakness and headaches.  Endo/Heme/Allergies: Does not bruise/bleed easily.  Psychiatric/Behavioral: Negative for depression and suicidal ideas. The patient does not have insomnia.       Allergies  Allergen Reactions  . Sulfa Antibiotics Anaphylaxis     Past Medical History:  Diagnosis Date  . Arthritis    left hip, left ankle  . Cholelithiases   . CKD (chronic kidney disease), stage II   . Coronary artery disease    a. cardiac cath 01/2011: mid LAD stent patent, mild lumenal irreg's, nl EF 55%, no AS/MR  . Fibromyalgia   . Frozen shoulder   . History of kidney stones   . HTN (hypertension)    controlled with medication;   . Hyperlipidemia    a. statin intolerant  . Iron deficiency   . Thrombocytopenia ([place])   . UTI (urinary tract infection)   . Venous stasis      Past Surgical History:  Procedure Laterality Date  . CARDIAC CATHETERIZATION     stents placed in mid LAD  . CERVICAL SPINE SURGERY    . FETAL BLOOD TRANSFUSION  2014  . Frozen Shoulder    . KIDNEY STONE SURGERY    . LEG SURGERY     broke her leg     Social History   Social History  . Marital status: Widowed    Spouse name: N/A  .  Number of children: N/A  . Years of education: N/A   Occupational History  . Not on file.   Social History Main Topics  . Smoking status: Never Smoker  . Smokeless tobacco: Never Used  . Alcohol use No  . Drug use: No  . Sexual activity: Not on file   Other Topics Concern  . Not on file   Social History Narrative  . No narrative on file    Family History  Problem Relation Age of  Onset  . Hypertension Mother   . Heart attack Mother   . Heart attack Father   . Heart disease Brother     h/o CABG  . Diabetes Brother   . Kidney disease Brother      Current Outpatient Prescriptions:  .  Acetaminophen (TYLENOL 8 HOUR PO), Take 2 tablets by mouth every 8 (eight) hours as needed. , Disp: , Rfl:  .  aspirin EC 81 MG tablet, Take 81 mg by mouth every other day. , Disp: 90 tablet, Rfl: 3 .  Cyanocobalamin 1000 MCG/ML KIT, Inject 1000 mcg subcutaneously once a week for 4 doses, then switch to once a month, to continue indefinitely, Disp: 10 kit, Rfl: 3 .  losartan-hydrochlorothiazide (HYZAAR) 100-25 MG tablet, take 1 tablet by mouth once daily, Disp: 90 tablet, Rfl: 3 .  conjugated estrogens (PREMARIN) vaginal cream, Place 1 Applicatorful vaginally daily. Patient was given a sample of vaginal estrogen cream and instructed to apply 0.10m (pea-sized amount)  just inside the vaginal introitus with a finger-tip every night for two weeks and then Monday, Wednesday and Friday nights. (Patient not taking: Reported on 07/31/2016), Disp: 42.5 g, Rfl: 12  Physical exam:  Vitals:   07/31/16 0957  BP: 125/71  Pulse: 62  Resp: 18  Temp: 97.8 F (36.6 C)  TempSrc: Tympanic  Weight: 179 lb 1.6 oz (81.2 kg)  Height: _0  (1.702 m)   Physical Exam  Constitutional: She is oriented to person, place, and time and well-developed, well-nourished, and in no distress.  HENT:  Head: Normocephalic and atraumatic.  Eyes: EOM are normal. Pupils are equal, round, and reactive to light.  Neck: Normal range of motion.  Cardiovascular: Normal rate, regular rhythm and normal heart sounds.   Pulmonary/Chest: Effort normal and breath sounds normal.  Abdominal: Soft. Bowel sounds are normal.  Neurological: She is alert and oriented to person, place, and time.  Skin: Skin is warm and dry.     CMP Latest Ref Rng & Units 07/31/2016  Glucose 65 - 99 mg/dL 126(H)  BUN 6 - 20 mg/dL 36(H)  Creatinine  0.44 - 1.00 mg/dL 1.44(H)  Sodium 135 - 145 mmol/L 136  Potassium 3.5 - 5.1 mmol/L 3.3(L)  Chloride 101 - 111 mmol/L 102  CO2 22 - 32 mmol/L 26  Calcium 8.9 - 10.3 mg/dL 9.2  Total Protein 6.5 - 8.1 g/dL 7.8  Total Bilirubin 0.3 - 1.2 mg/dL 0.6  Alkaline Phos 38 - 126 U/L 107  AST 15 - 41 U/L 26  ALT 14 - 54 U/L 12(L)   CBC Latest Ref Rng & Units 07/31/2016  WBC 3.6 - 11.0 K/uL 8.6  Hemoglobin 12.0 - 16.0 g/dL 9.7(L)  Hematocrit 35.0 - 47.0 % 28.8(L)  Platelets 150 - 440 K/uL 294    No images are attached to the encounter.  No results found.   Assessment and plan- Patient is a 76y.o. female who sees [place]:  1. Anemia of chronic kidney disease- H/H  stable around 9.5- 9.9 over last 6 months. Patient is asy,ptomatic. Hold off epo at this time and will consider if H/H is worse. I aslo discussed see nephrology for her ckd. She would like to think about this and should discuss with her pcp. Repeat labs in 3 months and see me in 6 months with labs  2. b12 deficiency- last b12 shot was last month. Will hold it for now and give her trial of oral B12 1000 mcg daily. Repeat levels in 3 months and decide if she needs b12 injections   Visit Diagnosis 1. Anemia of chronic renal failure, unspecified CKD stage   2. B12 deficiency      Dr. Randa Evens, MD, MPH [facility] at [facility] Pager- [phone] 07/31/2016 10:21 AM

## 2016-07-31 NOTE — Progress Notes (Signed)
Pt changed to another sleep med but does not remember what the name is. Feels tired and trying boost and energy drinks that are healthy.

## 2016-08-01 NOTE — Addendum Note (Signed)
Addended by: Corene CorneaVENABLE, [place] Y on: 08/01/2016 08:33 AM   Modules accepted: Orders

## 2016-08-31 ENCOUNTER — Ambulatory Visit: Payer: Medicare Other

## 2016-08-31 DIAGNOSIS — I6523 Occlusion and stenosis of bilateral carotid arteries: Secondary | ICD-10-CM

## 2016-08-31 LAB — VAS US CAROTID
LCCAPDIAS: 22 cm/s
LCCAPSYS: 155 cm/s
LEFT ECA DIAS: 0 cm/s
LEFT VERTEBRAL DIAS: 24 cm/s
Left CCA dist dias: -24 cm/s
Left CCA dist sys: -148 cm/s
Left ICA dist dias: -40 cm/s
Left ICA dist sys: -165 cm/s
Left ICA prox dias: -22 cm/s
Left ICA prox sys: -223 cm/s
RCCAPDIAS: 27 cm/s
RIGHT ECA DIAS: 0 cm/s
RIGHT VERTEBRAL DIAS: -26 cm/s
Right CCA prox sys: 153 cm/s
Right cca dist sys: -133 cm/s

## 2016-10-31 ENCOUNTER — Inpatient Hospital Stay: Payer: Medicare Other | Attending: Oncology

## 2016-10-31 DIAGNOSIS — Z79899 Other long term (current) drug therapy: Secondary | ICD-10-CM | POA: Insufficient documentation

## 2016-10-31 DIAGNOSIS — E538 Deficiency of other specified B group vitamins: Secondary | ICD-10-CM | POA: Insufficient documentation

## 2016-10-31 DIAGNOSIS — Z7982 Long term (current) use of aspirin: Secondary | ICD-10-CM | POA: Insufficient documentation

## 2016-10-31 DIAGNOSIS — E785 Hyperlipidemia, unspecified: Secondary | ICD-10-CM | POA: Diagnosis not present

## 2016-10-31 DIAGNOSIS — N182 Chronic kidney disease, stage 2 (mild): Secondary | ICD-10-CM | POA: Insufficient documentation

## 2016-10-31 DIAGNOSIS — I129 Hypertensive chronic kidney disease with stage 1 through stage 4 chronic kidney disease, or unspecified chronic kidney disease: Secondary | ICD-10-CM | POA: Insufficient documentation

## 2016-10-31 DIAGNOSIS — D890 Polyclonal hypergammaglobulinemia: Secondary | ICD-10-CM | POA: Diagnosis not present

## 2016-10-31 DIAGNOSIS — M797 Fibromyalgia: Secondary | ICD-10-CM | POA: Insufficient documentation

## 2016-10-31 DIAGNOSIS — N189 Chronic kidney disease, unspecified: Secondary | ICD-10-CM

## 2016-10-31 DIAGNOSIS — I2510 Atherosclerotic heart disease of native coronary artery without angina pectoris: Secondary | ICD-10-CM | POA: Insufficient documentation

## 2016-10-31 DIAGNOSIS — D631 Anemia in chronic kidney disease: Secondary | ICD-10-CM | POA: Diagnosis not present

## 2016-10-31 LAB — COMPREHENSIVE METABOLIC PANEL
ALBUMIN: 3.7 g/dL (ref 3.5–5.0)
ALT: 13 U/L — ABNORMAL LOW (ref 14–54)
ANION GAP: 9 (ref 5–15)
AST: 23 U/L (ref 15–41)
Alkaline Phosphatase: 108 U/L (ref 38–126)
BUN: 37 mg/dL — AB (ref 6–20)
CHLORIDE: 104 mmol/L (ref 101–111)
CO2: 25 mmol/L (ref 22–32)
Calcium: 9.3 mg/dL (ref 8.9–10.3)
Creatinine, Ser: 1.61 mg/dL — ABNORMAL HIGH (ref 0.44–1.00)
GFR calc Af Amer: 35 mL/min — ABNORMAL LOW (ref >60)
GFR calc non Af Amer: 30 mL/min — ABNORMAL LOW (ref >60)
GLUCOSE: 123 mg/dL — AB (ref 65–99)
POTASSIUM: 3.9 mmol/L (ref 3.5–5.1)
SODIUM: 138 mmol/L (ref 135–145)
Total Bilirubin: 0.4 mg/dL (ref 0.3–1.2)
Total Protein: 7.7 g/dL (ref 6.5–8.1)

## 2016-10-31 LAB — CBC
HEMATOCRIT: 30.3 % — AB (ref 35.0–47.0)
Hemoglobin: 10.1 g/dL — ABNORMAL LOW (ref 12.0–16.0)
MCH: 27.6 pg (ref 26.0–34.0)
MCHC: 33.4 g/dL (ref 32.0–36.0)
MCV: 82.6 fL (ref 80.0–100.0)
PLATELETS: 297 10*3/uL (ref 150–440)
RBC: 3.66 MIL/uL — ABNORMAL LOW (ref 3.80–5.20)
RDW: 13.3 % (ref 11.5–14.5)
WBC: 8.1 10*3/uL (ref 3.6–11.0)

## 2016-10-31 LAB — VITAMIN B12: Vitamin B-12: 1984 pg/mL — ABNORMAL HIGH (ref 180–914)

## 2016-11-02 ENCOUNTER — Telehealth: Payer: Self-pay | Admitting: *Deleted

## 2016-11-02 NOTE — Telephone Encounter (Signed)
Called and spoke to pt about creat level keep rising. hgb 10.1 this time and that is good. Dr. Smith Robertao concerned that creat . Keeps getting more elevated and would like her to see nephrology.   I have spoke to pt and she is agreeable to the nephrology appt. Will make referrral on Monday and she wants [facility]

## 2016-11-02 NOTE — Telephone Encounter (Signed)
-----   Message from Creig HinesArchana C Rao, MD sent at 10/31/2016 10:29 AM EDT ----- Madaline SavagePleas emake nephrology referral if patient agreeable and inform pcp

## 2016-11-04 ENCOUNTER — Other Ambulatory Visit: Payer: Self-pay | Admitting: Oncology

## 2016-11-04 DIAGNOSIS — D631 Anemia in chronic kidney disease: Secondary | ICD-10-CM

## 2016-11-04 DIAGNOSIS — N189 Chronic kidney disease, unspecified: Principal | ICD-10-CM

## 2016-11-16 ENCOUNTER — Telehealth: Payer: Self-pay | Admitting: *Deleted

## 2016-11-16 NOTE — Telephone Encounter (Signed)
Called [facility] about her new pt appt. I faxed the papers over 8/13 and jennifer states they have everything they need. Just waiting on Agel to make her appt. Dr. Cherylann Ratel if very busy and is in office 2 weeks and in hospital 1 week. I asked when will pt get appt and she said Lawanna Kobus will call her in 1-2 weeks. I called pt back and explained above and she wanted to see if [place] location could see her sooner.  I called [place] office and spoke to [place]. She states that the [facility] office will not have an appt until first week or two in October.  The  office can see pt 9/20 at 8:20.  I told mandy that I will call pt and see if that is ok and I called pt and told her. 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  I told her where the place is located and she will find it.  I also told her that they will mail her  A packet that will have directions also.  I called back to [place] office and let mandy know she will take the appt in [place]. I also asked her to let [facility] office know and she said she will tell them

## 2016-12-19 ENCOUNTER — Other Ambulatory Visit: Payer: Self-pay | Admitting: Nephrology

## 2016-12-19 DIAGNOSIS — N183 Chronic kidney disease, stage 3 unspecified: Secondary | ICD-10-CM

## 2016-12-21 NOTE — Progress Notes (Signed)
12/24/2016 9:33 AM   [name] Cooper 27-Oct-1940 [id number]  Referring provider: [name], MD [address] [facility] [place], [place] [address]  Chief Complaint  Patient presents with  . Recurrent UTI    referred by Dr. Westley Gambles last seen 10/2014 in our office    HPI: Patient is a 76 -year-old [place] American female who is referred to [place] by, Dr. Murlean Iba, for recurrent urinary tract infections.  Patient states that she has had 2 to 3 urinary tract infections over the last year.  Reviewing her records,  she has had one documented positive urine culture of this year.  Over the last 2 years she has had 4 documented positive urine cultures.  Her symptoms with a urinary tract infection consist of nocturia, frequency, hurting and burning during urination.  She is currently on Augmentin for an UTI by Dr. Candiss Norse.    She denies gross hematuria, suprapubic pain, back pain, abdominal pain or flank pain.  She has not had any recent fevers, chills, nausea or vomiting.  Her PVR is 0 mL.    She does have a history of nephrolithiasis.  She may have had URS to remove the stones.  She is not sexually active.   She is postmenopausal.   She denies constipation and/or diarrhea.   She does engage in good perineal hygiene. She does not take tub baths.   She does not have incontinence.  Noncontrast CT performed in October 2015 noted right renal calculi and bilateral renal cysts.  She has an upcoming RUS on 12/26/2016.    She is drinking 16 ounces of water daily.   She drinks Boost and protein drinks.  She has a soda once a day.  She has iced sweet tea daily as well.    She is no longer applying the vaginal estrogen cream at this time.    Reviewed referral notes -see above  PMH: Past Medical History:  Diagnosis Date  . Arthritis    left hip, left ankle  . Cholelithiases   . CKD (chronic kidney disease), stage II   . Coronary artery disease    a. cardiac cath 01/2011: mid  LAD stent patent, mild lumenal irreg's, nl EF 55%, no AS/MR  . Fibromyalgia   . Frozen shoulder   . History of kidney stones   . HTN (hypertension)    controlled with medication;   . Hyperlipidemia    a. statin intolerant  . Iron deficiency   . Thrombocytopenia ([place])   . UTI (urinary tract infection)   . Venous stasis     Surgical History: Past Surgical History:  Procedure Laterality Date  . CARDIAC CATHETERIZATION     stents placed in mid LAD  . CERVICAL SPINE SURGERY    . FETAL BLOOD TRANSFUSION  2014  . Frozen Shoulder    . KIDNEY STONE SURGERY    . LEG SURGERY     broke her leg     Home Medications:  Allergies as of 12/24/2016      Reactions   Sulfa Antibiotics Anaphylaxis      Medication List       Accurate as of 12/24/16  9:33 AM. Always use your most recent med list.          amoxicillin-clavulanate 500-125 MG tablet Commonly known as:  AUGMENTIN Take 1 tablet by mouth 3 (three) times daily.   aspirin EC 81 MG tablet Take 81 mg by mouth every other day.   estradiol  0.1 MG/GM vaginal cream Commonly known as:  ESTRACE VAGINAL Apply 0.31m (pea-sized amount)  just inside the vaginal introitus with a finger-tip every night for two weeks and then Monday, Wednesday and Friday nights.   levofloxacin 500 MG tablet Commonly known as:  LEVAQUIN levofloxacin 500 mg tablet   losartan-hydrochlorothiazide 100-25 MG tablet Commonly known as:  HYZAAR take 1 tablet by mouth once daily   rosuvastatin 10 MG tablet Commonly known as:  CRESTOR Take by mouth.   temazepam 15 MG capsule Commonly known as:  RESTORIL take 1 capsule by mouth at bedtime if needed   TYLENOL 8 HOUR PO Take 2 tablets by mouth every 8 (eight) hours as needed.   vitamin B-12 1000 MCG tablet Commonly known as:  CYANOCOBALAMIN Take 1 tablet (1,000 mcg total) by mouth daily.   zolpidem 10 MG tablet Commonly known as:  AMBIEN zolpidem 10 mg tablet            Discharge Care  Instructions        Start     Ordered   12/24/16 0000  BLADDER SCAN AMB NON-IMAGING     12/24/16 0901   12/24/16 0000  estradiol (ESTRACE VAGINAL) 0.1 MG/GM vaginal cream    Question:  Supervising Provider  Answer:  BHollice Espy  12/24/16 0932      Allergies:  Allergies  Allergen Reactions  . Sulfa Antibiotics Anaphylaxis    Family History: Family History  Problem Relation Age of Onset  . Hypertension Mother   . Heart attack Mother   . Heart attack Father   . Heart disease Brother        h/o CABG  . Diabetes Brother   . Kidney disease Brother   . Kidney cancer Neg Hx   . Bladder Cancer Neg Hx     Social History:  reports that she has never smoked. She has never used smokeless tobacco. She reports that she does not drink alcohol or use drugs.  ROS: UROLOGY Frequent Urination?: Yes Hard to postpone urination?: No Burning/pain with urination?: Yes Get up at night to urinate?: Yes Leakage of urine?: No Urine stream starts and stops?: No Trouble starting stream?: No Do you have to strain to urinate?: No Blood in urine?: No Urinary tract infection?: Yes Sexually transmitted disease?: No Injury to kidneys or bladder?: No Painful intercourse?: No Weak stream?: No Currently pregnant?: No Vaginal bleeding?: No Last menstrual period?: n  Gastrointestinal Nausea?: No Vomiting?: No Indigestion/heartburn?: No Diarrhea?: No Constipation?: No  Constitutional Fever: No Night sweats?: No Weight loss?: No Fatigue?: No  Skin Skin rash/lesions?: No Itching?: No  Eyes Blurred vision?: No Double vision?: No  Ears/Nose/Throat Sore throat?: No Sinus problems?: No  Hematologic/Lymphatic Swollen glands?: No Easy bruising?: Yes  Cardiovascular Leg swelling?: Yes Chest pain?: No  Respiratory Cough?: No Shortness of breath?: No  Endocrine Excessive thirst?: No  Musculoskeletal Back pain?: Yes Joint pain?: Yes  Neurological Headaches?:  No Dizziness?: No  Psychologic Depression?: No Anxiety?: No  Physical Exam: BP (!) 162/69   Pulse 61   Ht 5' 7"  (1.702 m)   Wt 183 lb 1.6 oz (83.1 kg)   BMI 28.68 kg/m   Constitutional: Well nourished. Alert and oriented, No acute distress. HEENT: [place] AT, moist mucus membranes. Trachea midline, no masses. Cardiovascular: No clubbing, cyanosis, or edema. Respiratory: Normal respiratory effort, no increased work of breathing. GI: Abdomen is soft, non tender, non distended, no abdominal masses. Liver and spleen not palpable.  No  hernias appreciated.  Stool sample for occult testing is not indicated.   GU: No CVA tenderness.  No bladder fullness or masses.  Atrophic external genitalia, normal pubic hair distribution, no lesions.  Normal urethral meatus, no lesions, no prolapse, no discharge.   No urethral masses, tenderness and/or tenderness. No bladder fullness, tenderness or masses. Normal vagina mucosa, good estrogen effect, no discharge, no lesions, good pelvic support, no cystocele or rectocele noted.  No cervical motion tenderness.  Uterus is freely mobile and non-fixed.  No adnexal/parametria masses or tenderness noted.  Anus and perineum are without rashes or lesions.    Skin: No rashes, bruises or suspicious lesions. Lymph: No cervical or inguinal adenopathy. Neurologic: Grossly intact, no focal deficits, moving all 4 extremities. Psychiatric: Normal mood and affect.  Laboratory Data: Lab Results  Component Value Date   WBC 8.1 10/31/2016   HGB 10.1 (L) 10/31/2016   HCT 30.3 (L) 10/31/2016   MCV 82.6 10/31/2016   PLT 297 10/31/2016    Lab Results  Component Value Date   CREATININE 1.61 (H) 10/31/2016    Lab Results  Component Value Date   AST 23 10/31/2016   Lab Results  Component Value Date   ALT 13 (L) 10/31/2016     I have reviewed the labs.   Pertinent Imaging: CLINICAL DATA:  Several week history of right flank pain   EXAM:  CT ABDOMEN AND PELVIS  WITHOUT CONTRAST   TECHNIQUE:  Multidetector CT imaging of the abdomen and pelvis was performed  following the standard protocol without oral or intravenous contrast  material administration.   COMPARISON:  None.   FINDINGS:  There is mild scarring in the lung bases bilaterally.   No focal lesions are identified on this noncontrast enhanced study.  Gallbladder is absent. There is no biliary duct dilatation.   Spleen, pancreas, and adrenals appear normal.   There is a calculus in the mid right kidney measuring 7 x 6 mm.  There are several nearby 1 mm calculi. There is scarring in this  region the right kidney. There are cysts bilaterally. No non cystic  renal masses are identified. The largest individual cyst on the left  measures 3.5 x 4.3 cm. The next largest cyst on the left measures  3.2 x 3.2 cm. The largest cyst on the right measures 2.5 x 2.3 cm.  No noncystic renal masses are identified on either side. There is no  hydronephrosis on either side. There are no intrarenal calculi on  the left. There are no ureteral calculi on either side.   In the pelvis, the urinary bladder is midline with normal wall  thickness. There are multiple intrauterine calcifications consistent  with calcified leiomyomatous change. No well-defined dominant  intrauterine mass is seen on this noncontrast enhanced study.  Outside of the uterus, there is no pelvic mass. No free pelvic  fluid. Terminal ileum appears normal. There is lipomatous  infiltration in the ileocecal valve. Appendix is not seen. There is  no periappendiceal region inflammation.   No bowel obstruction. No free air or portal venous air. There is no  ascites, adenopathy, or abscess in the abdomen or pelvis. There is  no demonstrable abdominal aortic aneurysm. There is atherosclerotic  change in the aorta. There is degenerative change in the visualized  thoracic and lumbar spine regions. There are no blastic or lytic  bone  lesions.   IMPRESSION:  Nonobstructing calculi right kidney. Multiple renal cysts  bilaterally. No hydronephrosis on either side.  No ureteral calculi.   Calcified uterine leiomyomas. No bowel obstruction or mesenteric  inflammation. No abscess. No periappendiceal region inflammation.  Incidental note is made of lipomatous infiltration of the ileocecal  valve.    Electronically Signed    By: Lowella Grip M.D.    On: 12/28/2013 12:12    Results for ADDLEY, [name] Cooper (MRN [id number]) as of 12/24/2016 09:21  Ref. Range 12/24/2016 09:01  Scan Result Unknown 0   CLINICAL DATA:  Stage 3 chronic kidney disease.  EXAM: RENAL / URINARY TRACT ULTRASOUND COMPLETE  COMPARISON:  12/28/2013 unenhanced CT abdomen/ pelvis.  FINDINGS: Right Kidney:  Length: 9.1 cm. Mildly echogenic and mildly atrophic right renal parenchyma. No right hydronephrosis. Mildly complex 2.7 x 2.9 x 3.3 cm interpolar right renal cyst with thin septal calcifications, stable since 12/28/2013. Mildly complex 2.3 x 3.0 x 2.3 cm upper right renal cyst with thin internal septation, not appreciably changed in size since 12/28/2013.  Left Kidney:  Length: 9.3 cm. Mildly echogenic and mildly atrophic left renal parenchyma. No left hydronephrosis. Simple 3.4 x 3.5 x 3.3 cm interpolar left renal cyst. Limited visualization of a simple appearing 2.2 x 2.3 x 2.4 cm upper left renal cyst.  Bladder:  Collapsed and grossly normal.  IMPRESSION: 1. No hydronephrosis. 2. Mildly echogenic and mildly atrophic kidneys bilaterally, compatible with chronic nonspecific renal parenchymal disease. 3. Benign-appearing bilateral renal cysts . 4. Collapsed and grossly normal bladder.   Electronically Signed   By: Ilona Sorrel M.D.   On: 12/26/2016 15:55  I have independently reviewed the films.    Assessment & Plan:    1. Personal history of recurrent UTI's  - criteria for recurrent UTI has been met with 2  or more infections in 6 months or 3 or greater infections in one year  - Patient is instructed to increase their water intake until the urine is pale yellow or clear (10 to 12 cups daily)   - probiotics (yogurt, oral pills or vaginal suppositories), take cranberry pills or drink the juice and Vitamin C 1,000 mg daily to acidify the urine should be added to their daily regimen   - avoid soaking in tubs and wipe front to back after urinating   - advised them to have CATH UA's for urinalysis and culture to prevent skin contamination of the specimen  - reviewed symptoms of UTI and advised not to have urine checked or be treated for UTI if not experiencing symptoms  - discussed antibiotic stewardship with the patient    - RUS did not identify a nidus for recurrent UTIs  2. Vaginal atrophy  - I explained to the patient that when women go through menopause and her estrogen levels are severely diminished, the normal vaginal flora will change.  This is due to an increase of the vaginal canal's pH. Because of this, the vaginal canal may be colonized by bacteria from the rectum instead of the protective lactobacillus.  This accompanied by the loss of the mucus barrier with vaginal atrophy is a cause of recurrent urinary tract infections.  - In some studies, the use of vaginal estrogen cream has been demonstrated to reduce  recurrent urinary tract infections to one a year.   - Patient was given a prescription of vaginal estrogen cream (Estrace) and instructed to apply 0.58m (pea-sized amount)  just inside the vaginal introitus with a finger-tip every night for two weeks and then Monday, Wednesday and Friday nights.  I explained to the patient that  vaginally administered estrogen, which causes only a slight increase in the blood estrogen levels, have fewer contraindications and adverse systemic effects that oral HT.  - She will follow up in three months for an exam.                                           Return in about 3 months (around 03/26/2017) for OAB questionnaire, PVR and exam.  These notes generated with voice recognition software. I apologize for typographical errors.  Zara Council, [facility] [address], [place], [place] [address] [phone]

## 2016-12-24 ENCOUNTER — Ambulatory Visit: Payer: Medicare Other | Admitting: Urology

## 2016-12-24 ENCOUNTER — Telehealth: Payer: Self-pay | Admitting: Radiology

## 2016-12-24 ENCOUNTER — Other Ambulatory Visit: Payer: Self-pay | Admitting: Radiology

## 2016-12-24 ENCOUNTER — Encounter: Payer: Self-pay | Admitting: Urology

## 2016-12-24 VITALS — BP 162/69 | HR 61 | Ht 67.0 in | Wt 183.1 lb

## 2016-12-24 DIAGNOSIS — N952 Postmenopausal atrophic vaginitis: Secondary | ICD-10-CM

## 2016-12-24 DIAGNOSIS — N390 Urinary tract infection, site not specified: Secondary | ICD-10-CM

## 2016-12-24 LAB — BLADDER SCAN AMB NON-IMAGING: Scan Result: 0

## 2016-12-24 MED ORDER — ESTRADIOL 0.1 MG/GM VA CREA: TOPICAL_CREAM | VAGINAL | 12 refills | Status: DC

## 2016-12-24 NOTE — Telephone Encounter (Signed)
Pt states Estrace cream is too expensive & that she was told something else could be prescribed. Please advise.

## 2016-12-24 NOTE — Telephone Encounter (Signed)
Please ask her if the Premarin cream is covered by her insurance.

## 2016-12-24 NOTE — Telephone Encounter (Signed)
Pt states Premarin costs more than Estrace & asks for another alternative. Per Carollee Herter, advised pt that estradiol would be called to [facility] pharmacy. Instructions given for use. Pt voices understanding.

## 2016-12-26 ENCOUNTER — Ambulatory Visit
Admission: RE | Admit: 2016-12-26 | Discharge: 2016-12-26 | Disposition: A | Discharge status: Home / Self Care | Payer: Medicare Other | Source: Ambulatory Visit | Attending: Nephrology | Admitting: Nephrology

## 2016-12-26 DIAGNOSIS — N281 Cyst of kidney, acquired: Secondary | ICD-10-CM | POA: Diagnosis not present

## 2016-12-26 DIAGNOSIS — N261 Atrophy of kidney (terminal): Secondary | ICD-10-CM | POA: Diagnosis not present

## 2016-12-26 DIAGNOSIS — N183 Chronic kidney disease, stage 3 unspecified: Secondary | ICD-10-CM

## 2017-01-31 ENCOUNTER — Encounter: Payer: Self-pay | Admitting: Oncology

## 2017-01-31 ENCOUNTER — Inpatient Hospital Stay: Payer: Medicare Other | Attending: Oncology | Admitting: Oncology

## 2017-01-31 ENCOUNTER — Other Ambulatory Visit: Payer: Self-pay | Admitting: Cardiovascular Disease

## 2017-01-31 ENCOUNTER — Inpatient Hospital Stay: Payer: Medicare Other

## 2017-01-31 VITALS — BP 148/74 | HR 64 | Temp 97.0°F | Resp 16 | Wt 187.0 lb

## 2017-01-31 DIAGNOSIS — D631 Anemia in chronic kidney disease: Secondary | ICD-10-CM | POA: Diagnosis not present

## 2017-01-31 DIAGNOSIS — E538 Deficiency of other specified B group vitamins: Secondary | ICD-10-CM

## 2017-01-31 DIAGNOSIS — I2510 Atherosclerotic heart disease of native coronary artery without angina pectoris: Secondary | ICD-10-CM | POA: Diagnosis not present

## 2017-01-31 DIAGNOSIS — N182 Chronic kidney disease, stage 2 (mild): Secondary | ICD-10-CM | POA: Diagnosis not present

## 2017-01-31 DIAGNOSIS — G8929 Other chronic pain: Secondary | ICD-10-CM | POA: Diagnosis not present

## 2017-01-31 DIAGNOSIS — I129 Hypertensive chronic kidney disease with stage 1 through stage 4 chronic kidney disease, or unspecified chronic kidney disease: Secondary | ICD-10-CM | POA: Diagnosis not present

## 2017-01-31 DIAGNOSIS — M797 Fibromyalgia: Secondary | ICD-10-CM | POA: Diagnosis not present

## 2017-01-31 DIAGNOSIS — D509 Iron deficiency anemia, unspecified: Secondary | ICD-10-CM | POA: Diagnosis not present

## 2017-01-31 DIAGNOSIS — R531 Weakness: Secondary | ICD-10-CM | POA: Diagnosis not present

## 2017-01-31 DIAGNOSIS — N189 Chronic kidney disease, unspecified: Secondary | ICD-10-CM

## 2017-01-31 DIAGNOSIS — E785 Hyperlipidemia, unspecified: Secondary | ICD-10-CM | POA: Diagnosis not present

## 2017-01-31 DIAGNOSIS — M129 Arthropathy, unspecified: Secondary | ICD-10-CM | POA: Diagnosis not present

## 2017-01-31 DIAGNOSIS — Z87442 Personal history of urinary calculi: Secondary | ICD-10-CM | POA: Insufficient documentation

## 2017-01-31 DIAGNOSIS — R5383 Other fatigue: Secondary | ICD-10-CM | POA: Diagnosis not present

## 2017-01-31 DIAGNOSIS — D696 Thrombocytopenia, unspecified: Secondary | ICD-10-CM | POA: Insufficient documentation

## 2017-01-31 DIAGNOSIS — D890 Polyclonal hypergammaglobulinemia: Secondary | ICD-10-CM | POA: Insufficient documentation

## 2017-01-31 LAB — COMPREHENSIVE METABOLIC PANEL
ALBUMIN: 3.6 g/dL (ref 3.5–5.0)
ALK PHOS: 126 U/L (ref 38–126)
ALT: 13 U/L — AB (ref 14–54)
ANION GAP: 8 (ref 5–15)
AST: 26 U/L (ref 15–41)
BUN: 31 mg/dL — AB (ref 6–20)
CALCIUM: 9.1 mg/dL (ref 8.9–10.3)
CO2: 24 mmol/L (ref 22–32)
Chloride: 106 mmol/L (ref 101–111)
Creatinine, Ser: 1.44 mg/dL — ABNORMAL HIGH (ref 0.44–1.00)
GFR calc Af Amer: 40 mL/min — ABNORMAL LOW (ref >60)
GFR calc non Af Amer: 34 mL/min — ABNORMAL LOW (ref >60)
GLUCOSE: 134 mg/dL — AB (ref 65–99)
Potassium: 3.6 mmol/L (ref 3.5–5.1)
SODIUM: 138 mmol/L (ref 135–145)
Total Bilirubin: 0.4 mg/dL (ref 0.3–1.2)
Total Protein: 7.9 g/dL (ref 6.5–8.1)

## 2017-01-31 LAB — CBC
HCT: 30.3 % — ABNORMAL LOW (ref 35.0–47.0)
HEMOGLOBIN: 9.7 g/dL — AB (ref 12.0–16.0)
MCH: 26.9 pg (ref 26.0–34.0)
MCHC: 31.9 g/dL — AB (ref 32.0–36.0)
MCV: 84.3 fL (ref 80.0–100.0)
Platelets: 268 10*3/uL (ref 150–440)
RBC: 3.6 MIL/uL — ABNORMAL LOW (ref 3.80–5.20)
RDW: 12.8 % (ref 11.5–14.5)
WBC: 7.6 10*3/uL (ref 3.6–11.0)

## 2017-01-31 LAB — VITAMIN B12: Vitamin B-12: 1925 pg/mL — ABNORMAL HIGH (ref 180–914)

## 2017-01-31 NOTE — Progress Notes (Signed)
Patient here for follow up with labs today. She states that she has been having right sided, lower back pain for a couple of months, which has gotten worse. She does not remember any injury that would have caused this.

## 2017-01-31 NOTE — Progress Notes (Signed)
Hematology/Oncology Consult note [facility]  [phone] [phone]  Patient Care Team: Jerl MinaHedrick, James, MD as PCP - General (Family Medicine) Winfred LeedsViens, Basilia JumboNicholas A, MD as Referring Physician (Orthopedic Surgery) Antonieta IbaGollan, Timothy J, MD as Consulting Physician (Cardiology)   Name of the patient: [name] Cooper  [id number]  Dec 18, 1940   Date of visit: 01/31/17  anemia of chronic kidney disease  b12 deficiency  Chief complaint/ Reason for visit- routine f/u for anemia of chronic kidney disease  Heme/Onc history: Patient is a 76 year old female who was seen by Dr. Hezzie BumpPerumandla in the past for evaluation of anemia. Her hemoglobin has been ranging between 9.9-10.8 over the last couple of years. She is also noted to have chronic kidney disease with a creatinine ranging from 1.5-2. 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   Review of blood work from October 2017 was as follows: methylmalonic acid was elevated at 778. TIBC was low and ferritin was elevated at 399. spep showed polyclonal gammopathy. Cbc showed white count of 9.2, H&H of 10.1/30.6 and a platelet count of 297. Reticulocyte count was low at 0.8 in appropriate for the degree of anemia. Normal immunofixation pattern was noted in urine  Patient was started on monthly B12 injections given elevated MMA    Interval history- reports chronic back pain. Some fatigue. Denies other complaints  ECOG PS- 1 Pain scale- 4   Review of systems- Review of Systems  Constitutional: Positive for malaise/fatigue. Negative for chills, fever and weight loss.  HENT: Negative for congestion, ear discharge and nosebleeds.   Eyes: Negative for blurred vision.  Respiratory: Negative for cough, hemoptysis, sputum production, shortness of breath and wheezing.   Cardiovascular: Negative for chest pain, palpitations, orthopnea and  claudication.  Gastrointestinal: Negative for abdominal pain, blood in stool, constipation, diarrhea, heartburn, melena, nausea and vomiting.  Genitourinary: Negative for dysuria, flank pain, frequency, hematuria and urgency.  Musculoskeletal: Positive for back pain. Negative for joint pain and myalgias.  Skin: Negative for rash.  Neurological: Negative for dizziness, tingling, focal weakness, seizures, weakness and headaches.  Endo/Heme/Allergies: Does not bruise/bleed easily.  Psychiatric/Behavioral: Negative for depression and suicidal ideas. The patient does not have insomnia.       Allergies  Allergen Reactions  . Sulfa Antibiotics Anaphylaxis     Past Medical History:  Diagnosis Date  . Arthritis    left hip, left ankle  . Cholelithiases   . CKD (chronic kidney disease), stage II   . Coronary artery disease    a. cardiac cath 01/2011: mid LAD stent patent, mild lumenal irreg's, nl EF 55%, no AS/MR  . Fibromyalgia   . Frozen shoulder   . History of kidney stones   . HTN (hypertension)    controlled with medication;   . Hyperlipidemia    a. statin intolerant  . Iron deficiency   . Thrombocytopenia (HCC)   . UTI (urinary tract infection)   . Venous stasis      Past Surgical History:  Procedure Laterality Date  . CARDIAC CATHETERIZATION     stents placed in mid LAD  . CERVICAL SPINE SURGERY    . FETAL BLOOD TRANSFUSION  2014  . Frozen Shoulder    . KIDNEY STONE SURGERY    . LEG SURGERY     broke her leg     Social History   Socioeconomic History  . Marital status: Widowed  Spouse name: Not on file  . Number of children: Not on file  . Years of education: Not on file  . Highest education level: Not on file  Social Needs  . Financial resource strain: Not on file  . Food insecurity - worry: Not on file  . Food insecurity - inability: Not on file  . Transportation needs - medical: Not on file  . Transportation needs - non-medical: Not on file    Occupational History  . Not on file  Tobacco Use  . Smoking status: Never Smoker  . Smokeless tobacco: Never Used  Substance and Sexual Activity  . Alcohol use: No  . Drug use: No  . Sexual activity: Not on file  Other Topics Concern  . Not on file  Social History Narrative  . Not on file    Family History  Problem Relation Age of Onset  . Hypertension Mother   . Heart attack Mother   . Heart attack Father   . Heart disease Brother        h/o CABG  . Diabetes Brother   . Kidney disease Brother   . Kidney cancer Neg Hx   . Bladder Cancer Neg Hx      Current Outpatient Medications:  .  Acetaminophen (TYLENOL 8 HOUR PO), Take 2 tablets by mouth every 8 (eight) hours as needed. , Disp: , Rfl:  .  aspirin EC 81 MG tablet, Take 81 mg by mouth every other day. , Disp: 90 tablet, Rfl: 3 .  losartan-hydrochlorothiazide (HYZAAR) 100-25 MG tablet, take 1 tablet by mouth once daily, Disp: 90 tablet, Rfl: 3 .  temazepam (RESTORIL) 15 MG capsule, take 1 capsule by mouth at bedtime if needed, Disp: , Rfl:  .  vitamin B-12 (CYANOCOBALAMIN) 1000 MCG tablet, Take 1 tablet (1,000 mcg total) by mouth daily., Disp: 30 tablet, Rfl: 5  Physical exam:  Vitals:   01/31/17 0931 01/31/17 0938  BP:  (!) 148/74  Pulse:  64  Resp:  16  Temp:  (!) 97 F (36.1 C)  TempSrc:  Tympanic  Weight: 187 lb (84.8 kg)    Physical Exam  Constitutional: She is oriented to person, place, and time and well-developed, well-nourished, and in no distress.  HENT:  Head: Normocephalic and atraumatic.  Eyes: EOM are normal. Pupils are equal, round, and reactive to light.  Neck: Normal range of motion.  Cardiovascular: Normal rate, regular rhythm and normal heart sounds.  Pulmonary/Chest: Effort normal and breath sounds normal.  Abdominal: Soft. Bowel sounds are normal.  Musculoskeletal: She exhibits edema.  Neurological: She is alert and oriented to person, place, and time.  Skin: Skin is warm and dry.      CMP Latest Ref Rng & Units 01/31/2017  Glucose 65 - 99 mg/dL 409(W134(H)  BUN 6 - 20 mg/dL 11(B31(H)  Creatinine 1.470.44 - 1.00 mg/dL 8.29(F1.44(H)  Sodium 621135 - 308145 mmol/L 138  Potassium 3.5 - 5.1 mmol/L 3.6  Chloride 101 - 111 mmol/L 106  CO2 22 - 32 mmol/L 24  Calcium 8.9 - 10.3 mg/dL 9.1  Total Protein 6.5 - 8.1 g/dL 7.9  Total Bilirubin 0.3 - 1.2 mg/dL 0.4  Alkaline Phos 38 - 126 U/L 126  AST 15 - 41 U/L 26  ALT 14 - 54 U/L 13(L)   CBC Latest Ref Rng & Units 01/31/2017  WBC 3.6 - 11.0 K/uL 7.6  Hemoglobin 12.0 - 16.0 g/dL 6.5(H9.7(L)  Hematocrit 84.635.0 - 47.0 % 30.3(L)  Platelets 150 -  440 K/uL 268     Assessment and plan- Patient is a 76 y.o. female with anemia of chronic kidney disease  Hemoglobin stable between 9.5-10.5 over last 1 year. Will not initiate EPO at this time unless hemoglobin consistently </= 9. She will continue oral B12. Levels from today are pending  Repeat cbc with diff in 3 months. I will see her back in 6 months with cbc with diff, iron studies and B12.   Visit Diagnosis 1. Anemia of chronic renal failure, unspecified CKD stage   2. B12 deficiency      Dr. Owens Shark, MD, MPH [facility] Pager- [phone] 01/31/2017 1:30 PM

## 2017-02-11 ENCOUNTER — Other Ambulatory Visit: Payer: Self-pay | Admitting: Cardiovascular Disease

## 2017-02-13 ENCOUNTER — Other Ambulatory Visit: Payer: Self-pay | Admitting: Cardiovascular Disease

## 2017-03-20 ENCOUNTER — Other Ambulatory Visit: Payer: Self-pay | Admitting: Cardiovascular Disease

## 2017-03-21 ENCOUNTER — Telehealth: Payer: Self-pay | Admitting: Cardiovascular Disease

## 2017-03-21 NOTE — Telephone Encounter (Signed)
Please advise if you would like this medication refilled. Patient was last seen 04/2015 and has not made a follow up appointment.

## 2017-03-21 NOTE — Telephone Encounter (Signed)
Received refill request from pharmacy for hyzaar. Pt last OV Feb 2017.  30 day refill was submitted 02/13/17. S/w pt and explained we can not refill medications without an office visit. Pt states she has more than a month's supply of hyzaar. Scheduled 04/17/17 OV w/Dr. Mariah MillingGollan.

## 2017-03-25 ENCOUNTER — Other Ambulatory Visit: Payer: Self-pay | Admitting: Cardiovascular Disease

## 2017-03-26 NOTE — Progress Notes (Deleted)
03/27/2017 10:51 PM   Vernal Trudie Reed Jun 02, 1940 [id number]  Referring provider: [name], MD [address] [facility] [place], [place] [address]  No chief complaint on file.   HPI: 77 yo AAF with a history of recurrent UTI's and vaginal atrophy who presents today for a three month follow up.  Background history Patient is a 60 -year-old [place] American female who is referred to [place] by, Dr. Mosetta Pigeon, for recurrent urinary tract infections.  Patient states that she has had 2 to 3 urinary tract infections over the last year.  Reviewing her records,  she has had one documented positive urine culture of this year.  Over the last 2 years she has had 4 documented positive urine cultures.  Her symptoms with a urinary tract infection consist of nocturia, frequency, hurting and burning during urination.  She is currently on Augmentin for an UTI by Dr. Thedore Mins.  She denies gross hematuria, suprapubic pain, back pain, abdominal pain or flank pain.  She has not had any recent fevers, chills, nausea or vomiting.  Her PVR is 0 mL.   She does have a history of nephrolithiasis.  She may have had URS to remove the stones.  She is not sexually active.   She is postmenopausal.   She denies constipation and/or diarrhea.   She does engage in good perineal hygiene. She does not take tub baths.   She does not have incontinence.  Noncontrast CT performed in October 2015 noted right renal calculi and bilateral renal cysts.  She has an upcoming RUS on 12/26/2016.  She is drinking 16 ounces of water daily.   She drinks Boost and protein drinks.  She has a soda once a day.  She has iced sweet tea daily as well.  She is no longer applying the vaginal estrogen cream at this time.    Today, she is experiencing urgency x *** (***), frequency x *** (***), not/is restricting fluids to avoid visits to the restroom ***, not/is engaging in toilet mapping, incontinence x *** (***) and nocturia x *** (***).   She  has not had any UTI's since her visit with [place] three months ago.  She denies dysuria, gross hematuria and suprapubic pain.  She has not had a fever, chills, nausea or vomiting.    PMH: Past Medical History:  Diagnosis Date  . Arthritis    left hip, left ankle  . Cholelithiases   . CKD (chronic kidney disease), stage II   . Coronary artery disease    a. cardiac cath 01/2011: mid LAD stent patent, mild lumenal irreg's, nl EF 55%, no AS/MR  . Fibromyalgia   . Frozen shoulder   . History of kidney stones   . HTN (hypertension)    controlled with medication;   . Hyperlipidemia    a. statin intolerant  . Iron deficiency   . Thrombocytopenia (HCC)   . UTI (urinary tract infection)   . Venous stasis     Surgical History: Past Surgical History:  Procedure Laterality Date  . CARDIAC CATHETERIZATION     stents placed in mid LAD  . CERVICAL SPINE SURGERY    . FETAL BLOOD TRANSFUSION  2014  . Frozen Shoulder    . KIDNEY STONE SURGERY    . LEG SURGERY     broke her leg     Home Medications:  Allergies as of 03/27/2017      Reactions   Sulfa Antibiotics Anaphylaxis  Medication List        Accurate as of 03/26/17 10:51 PM. Always use your most recent med list.          aspirin EC 81 MG tablet Take 81 mg by mouth every other day.   losartan-hydrochlorothiazide 100-25 MG tablet Commonly known as:  HYZAAR take 1 tablet by mouth once daily   temazepam 15 MG capsule Commonly known as:  RESTORIL take 1 capsule by mouth at bedtime if needed   TYLENOL 8 HOUR PO Take 2 tablets by mouth every 8 (eight) hours as needed.   vitamin B-12 1000 MCG tablet Commonly known as:  CYANOCOBALAMIN Take 1 tablet (1,000 mcg total) by mouth daily.       Allergies:  Allergies  Allergen Reactions  . Sulfa Antibiotics Anaphylaxis    Family History: Family History  Problem Relation Age of Onset  . Hypertension Mother   . Heart attack Mother   . Heart attack Father   . Heart  disease Brother        h/o CABG  . Diabetes Brother   . Kidney disease Brother   . Kidney cancer Neg Hx   . Bladder Cancer Neg Hx     Social History:  reports that  has never smoked. she has never used smokeless tobacco. She reports that she does not drink alcohol or use drugs.  ROS:                                        Physical Exam: There were no vitals taken for this visit.  Constitutional: Well nourished. Alert and oriented, No acute distress. HEENT: [place] AT, moist mucus membranes. Trachea midline, no masses. Cardiovascular: No clubbing, cyanosis, or edema. Respiratory: Normal respiratory effort, no increased work of breathing. GI: Abdomen is soft, non tender, non distended, no abdominal masses. Liver and spleen not palpable.  No hernias appreciated.  Stool sample for occult testing is not indicated.   GU: No CVA tenderness.  No bladder fullness or masses.  Atrophic external genitalia, normal pubic hair distribution, no lesions.  Normal urethral meatus, no lesions, no prolapse, no discharge.   No urethral masses, tenderness and/or tenderness. No bladder fullness, tenderness or masses. Normal vagina mucosa, good estrogen effect, no discharge, no lesions, good pelvic support, no cystocele or rectocele noted.  No cervical motion tenderness.  Uterus is freely mobile and non-fixed.  No adnexal/parametria masses or tenderness noted.  Anus and perineum are without rashes or lesions.    Skin: No rashes, bruises or suspicious lesions. Lymph: No cervical or inguinal adenopathy. Neurologic: Grossly intact, no focal deficits, moving all 4 extremities. Psychiatric: Normal mood and affect.  Constitutional: Well nourished. Alert and oriented, No acute distress. HEENT: [place] AT, moist mucus membranes. Trachea midline, no masses. Cardiovascular: No clubbing, cyanosis, or edema. Respiratory: Normal respiratory effort, no increased work of breathing. GI: Abdomen is soft, non  tender, non distended, no abdominal masses. Liver and spleen not palpable.  No hernias appreciated.  Stool sample for occult testing is not indicated.   GU: No CVA tenderness.  No bladder fullness or masses.  Normal external genitalia, normal pubic hair distribution, no lesions.  Normal urethral meatus, no lesions, no prolapse, no discharge.   No urethral masses, tenderness and/or tenderness. No bladder fullness, tenderness or masses. Normal vagina mucosa, good estrogen effect, no discharge, no lesions, good pelvic support, no cystocele  or rectocele noted.  No cervical motion tenderness.  Uterus is freely mobile and non-fixed.  No adnexal/parametria masses or tenderness noted.  Anus and perineum are without rashes or lesions.   *** Skin: No rashes, bruises or suspicious lesions. Lymph: No cervical or inguinal adenopathy. Neurologic: Grossly intact, no focal deficits, moving all 4 extremities. Psychiatric: Normal mood and affect.   Laboratory Data: Lab Results  Component Value Date   WBC 7.6 01/31/2017   HGB 9.7 (L) 01/31/2017   HCT 30.3 (L) 01/31/2017   MCV 84.3 01/31/2017   PLT 268 01/31/2017    Lab Results  Component Value Date   CREATININE 1.44 (H) 01/31/2017    Lab Results  Component Value Date   AST 26 01/31/2017   Lab Results  Component Value Date   ALT 13 (L) 01/31/2017   Results for orders placed or performed in visit on 01/31/17  Vitamin B12  Result Value Ref Range   Vitamin B-12 1,925 (H) 180 - 914 pg/mL  Comprehensive metabolic panel  Result Value Ref Range   Sodium 138 135 - 145 mmol/L   Potassium 3.6 3.5 - 5.1 mmol/L   Chloride 106 101 - 111 mmol/L   CO2 24 22 - 32 mmol/L   Glucose, Bld 134 (H) 65 - 99 mg/dL   BUN 31 (H) 6 - 20 mg/dL   Creatinine, Ser 6.211.44 (H) 0.44 - 1.00 mg/dL   Calcium 9.1 8.9 - 30.810.3 mg/dL   Total Protein 7.9 6.5 - 8.1 g/dL   Albumin 3.6 3.5 - 5.0 g/dL   AST 26 15 - 41 U/L   ALT 13 (L) 14 - 54 U/L   Alkaline Phosphatase 126 38 - 126 U/L    Total Bilirubin 0.4 0.3 - 1.2 mg/dL   GFR calc non Af Amer 34 (L) >60 mL/min   GFR calc Af Amer 40 (L) >60 mL/min   Anion gap 8 5 - 15  CBC  Result Value Ref Range   WBC 7.6 3.6 - 11.0 K/uL   RBC 3.60 (L) 3.80 - 5.20 MIL/uL   Hemoglobin 9.7 (L) 12.0 - 16.0 g/dL   HCT 65.730.3 (L) 84.635.0 - 96.247.0 %   MCV 84.3 80.0 - 100.0 fL   MCH 26.9 26.0 - 34.0 pg   MCHC 31.9 (L) 32.0 - 36.0 g/dL   RDW 95.212.8 84.111.5 - 32.414.5 %   Platelets 268 150 - 440 K/uL   I have reviewed the labs.   Assessment & Plan:    1. Personal history of recurrent UTI's  - reviewed UTI prevention strategies  - reminded the patient to contact our office with symptoms of UTI     2. Vaginal atrophy  - continue vaginal estrogen cream three nights weekly  - RTC in one year for exam                                       No Follow-up on file.  These notes generated with voice recognition software. I apologize for typographical errors.  Michiel CowboySHANNON Vernessa Likes, PA-C  [facility] [address], [address] [place], [place] [address] [phone]

## 2017-03-27 ENCOUNTER — Ambulatory Visit: Payer: Medicare Other | Admitting: Urology

## 2017-03-27 ENCOUNTER — Encounter: Payer: Self-pay | Admitting: Urology

## 2017-04-14 NOTE — Progress Notes (Signed)
Cardiology Office Note  Date:  04/17/2017   ID:  [name] Cooper, DOB Aug 13, 1940, MRN [id number]  PCP:  [name], MD   Chief Complaint  Patient presents with  . OTHER    LS 2017 no complaints today. Meds reviewed verbally with pt.    HPI:  [name] Cooper is a very pleasant 77 year old woman with history of  coronary artery disease,  stent placed to her mid LAD with repeat stenting for in-stent restenosis Over 10 years ago,  hyperlipidemia,  hypertension,  fibromyalgia and diffuse muscle aches and arthritis  who presents for Followup of her coronary artery disease. Moderate bilateral carotid arterial disease  works as a crossing guard   Denies any chest pain or shortness of breath on exertion She is concerned about history of Anemia, HCT 30.3, followed in hematology Always cold Lab work reviewed with her in detail  CR 1.44, BUN 31 Had B12 shot No recent lipid panel  Back pain, Right SI joint Denies any significant shortness of breath on exertion. No regular exercise program  Cut leg on right last year, needed 20 stitches  Prior history of diffuse pain everywhere, chest, back, legs to any palpation She is not been able to tolerate Crestor secondary to myalgias Trouble on lipitor, myalgias Does not want to retry statins  Previously seen by rheumatology, completed course of prednisone with no significant improvement in her symptoms.  She has bilateral 40-59% carotid arterial disease, bruit on the right  EKG on today's visit shows normal sinus rhythm with rate 65 bpm, no significant ST or T-wave changes  Other past medical history Cardiac catheter at [facility] for chest discomfort earlier in the year showed patent LAD stent with no other significant stenoses. Medical management recommended   PMH:   has a past medical history of Arthritis, Cholelithiases, CKD (chronic kidney disease), stage II, Coronary artery disease, Fibromyalgia, Frozen shoulder, History of kidney  stones, HTN (hypertension), Hyperlipidemia, Iron deficiency, Thrombocytopenia (HCC), UTI (urinary tract infection), and Venous stasis.  PSH:    Past Surgical History:  Procedure Laterality Date  . CARDIAC CATHETERIZATION     stents placed in mid LAD  . CERVICAL SPINE SURGERY    . FETAL BLOOD TRANSFUSION  2014  . Frozen Shoulder    . KIDNEY STONE SURGERY    . LEG SURGERY     broke her leg     Current Outpatient Medications  Medication Sig Dispense Refill  . Acetaminophen (TYLENOL 8 HOUR PO) Take 2 tablets by mouth every 8 (eight) hours as needed.     Marland Kitchen aspirin EC 81 MG tablet Take 81 mg by mouth every other day.  90 tablet 3  . losartan-hydrochlorothiazide (HYZAAR) 100-25 MG tablet take 1 tablet by mouth once daily 30 tablet 0  . temazepam (RESTORIL) 15 MG capsule take 1 capsule by mouth at bedtime if needed    . vitamin B-12 (CYANOCOBALAMIN) 1000 MCG tablet Take 1 tablet (1,000 mcg total) by mouth daily. 30 tablet 5   No current facility-administered medications for this visit.      Allergies:   Sulfa antibiotics   Social History:  The patient  reports that  has never smoked. she has never used smokeless tobacco. She reports that she does not drink alcohol or use drugs.   Family History:   family history includes Diabetes in her brother; Heart attack in her father and mother; Heart disease in her brother; Hypertension in her mother; Kidney disease in her brother.  Review of Systems: Review of Systems  Constitutional: Negative.        Feels cold  Respiratory: Negative.   Cardiovascular: Negative.   Gastrointestinal: Negative.   Musculoskeletal: Negative.        Leg weakness, gait instability, muscle pain in the legs  Neurological: Negative.   Psychiatric/Behavioral: Negative.   All other systems reviewed and are negative.    PHYSICAL EXAM: VS:  BP 130/60 (BP Location: Left Arm, Patient Position: Sitting, Cuff Size: Large)   Pulse 65   Ht 5\' 7"  (1.702 m)   Wt 179  lb 12 oz (81.5 kg)   BMI 28.15 kg/m  , BMI Body mass index is 28.15 kg/m. Constitutional:  oriented to person, place, and time. No distress.  HENT:  Head: Normocephalic and atraumatic.  Eyes:  no discharge. No scleral icterus.  Neck: Normal range of motion. Neck supple. No JVD present.  Cardiovascular: Normal rate, regular rhythm, normal heart sounds and intact distal pulses. Exam reveals no gallop and no friction rub. No edema No murmur heard. Pulmonary/Chest: Effort normal and breath sounds normal. No stridor. No respiratory distress.  no wheezes.  no rales.  no tenderness.  Abdominal: Soft.  no distension.  no tenderness.  Musculoskeletal: Normal range of motion.  no  tenderness or deformity.  Neurological:  normal muscle tone. Coordination normal. No atrophy Skin: Skin is warm and dry. No rash noted. not diaphoretic.  Psychiatric:  normal mood and affect. behavior is normal. Thought content normal.      Recent Labs: 01/31/2017: ALT 13; BUN 31; Creatinine, Ser 1.44; Hemoglobin 9.7; Platelets 268; Potassium 3.6; Sodium 138    Lipid Panel Lab Results  Component Value Date   CHOL 214 (H) 12/31/2014   HDL 65 12/31/2014   LDLCALC 131 (H) 12/31/2014   TRIG 88 12/31/2014      Wt Readings from Last 3 Encounters:  04/17/17 179 lb 12 oz (81.5 kg)  01/31/17 187 lb (84.8 kg)  12/24/16 183 lb 1.6 oz (83.1 kg)       ASSESSMENT AND PLAN:  Coronary artery disease of native artery of native heart with stable angina pectoris (HCC) - Plan: EKG 12-Lead Currently with no symptoms of angina. No further workup at this time. Continue current medication regimen.  Stable  Need better cholesterol control Weight is down  Bilateral carotid artery stenosis Moderate disease, stressed the need for better cholesterol control lifestyle modification,  Walking program  Essential hypertension, benign Blood pressure is well controlled on today's visit. No changes made to the medications.  Mixed  hyperlipidemia  Does not want a statin given history of myalgias Recommend she start Zetia 10 mg daily She may benefit from PCSK9 inhibitors We will wait on repeat lab work 3-6 months with primary care  Disposition:   F/U  12 months   Total encounter time more than 25 minutes  Greater than 50% was spent in counseling and coordination of care with the patient    Orders Placed This Encounter  Procedures  . EKG 12-Lead     Signed, Dossie Arbourim Gollan, M.D., Ph.D. 04/17/2017  [facility], [place] [phone]

## 2017-04-17 ENCOUNTER — Ambulatory Visit: Payer: Medicare Other | Admitting: Cardiovascular Disease

## 2017-04-17 ENCOUNTER — Encounter: Payer: Self-pay | Admitting: Cardiovascular Disease

## 2017-04-17 VITALS — BP 130/60 | HR 65 | Ht 67.0 in | Wt 179.8 lb

## 2017-04-17 DIAGNOSIS — I10 Essential (primary) hypertension: Secondary | ICD-10-CM | POA: Diagnosis not present

## 2017-04-17 DIAGNOSIS — I25118 Atherosclerotic heart disease of native coronary artery with other forms of angina pectoris: Secondary | ICD-10-CM | POA: Diagnosis not present

## 2017-04-17 DIAGNOSIS — E782 Mixed hyperlipidemia: Secondary | ICD-10-CM

## 2017-04-17 DIAGNOSIS — I6523 Occlusion and stenosis of bilateral carotid arteries: Secondary | ICD-10-CM

## 2017-04-17 MED ORDER — EZETIMIBE 10 MG PO TABS: 10.0000 mg | ORAL_TABLET | Freq: Every day | ORAL | 3 refills | Status: DC

## 2017-04-17 NOTE — Patient Instructions (Signed)
Medication Instructions:   Please start zetia one a day for cholesterol  Labwork:  No new labs needed  Testing/Procedures:  No further testing at this time   Follow-Up: It was a pleasure seeing you in the office today. Please call us if you have new issues that need to be addressed before your next appt.  [phone]  Your physician wants you to follow-up in: 12 months.  You will receive a reminder letter in the mail two months in advance. If you don't receive a letter, please call our office to schedule the follow-up appointment.  If you need a refill on your cardiac medications before your next appointment, please call your pharmacy.     

## 2017-04-27 ENCOUNTER — Other Ambulatory Visit: Payer: Self-pay | Admitting: Oncology

## 2017-04-27 DIAGNOSIS — E538 Deficiency of other specified B group vitamins: Secondary | ICD-10-CM

## 2017-04-27 DIAGNOSIS — N189 Chronic kidney disease, unspecified: Principal | ICD-10-CM

## 2017-04-27 DIAGNOSIS — D631 Anemia in chronic kidney disease: Secondary | ICD-10-CM

## 2017-05-02 ENCOUNTER — Inpatient Hospital Stay: Payer: Medicare Other | Attending: Oncology

## 2017-05-28 ENCOUNTER — Other Ambulatory Visit: Payer: Self-pay | Admitting: Cardiovascular Disease

## 2017-06-19 ENCOUNTER — Other Ambulatory Visit: Payer: Self-pay | Admitting: Family Medicine

## 2017-06-19 ENCOUNTER — Ambulatory Visit
Admission: RE | Admit: 2017-06-19 | Discharge: 2017-06-19 | Disposition: A | Discharge status: Home / Self Care | Payer: Medicare Other | Source: Ambulatory Visit | Attending: Family Medicine | Admitting: Family Medicine

## 2017-06-19 DIAGNOSIS — M47816 Spondylosis without myelopathy or radiculopathy, lumbar region: Secondary | ICD-10-CM | POA: Insufficient documentation

## 2017-06-19 DIAGNOSIS — M545 Low back pain: Secondary | ICD-10-CM

## 2017-06-19 DIAGNOSIS — M25552 Pain in left hip: Secondary | ICD-10-CM

## 2017-06-19 DIAGNOSIS — I700 Atherosclerosis of aorta: Secondary | ICD-10-CM | POA: Diagnosis not present

## 2017-08-01 ENCOUNTER — Inpatient Hospital Stay: Payer: Medicare Other | Attending: Oncology

## 2017-08-01 ENCOUNTER — Other Ambulatory Visit: Payer: Self-pay

## 2017-08-01 ENCOUNTER — Encounter: Payer: Self-pay | Admitting: Oncology

## 2017-08-01 ENCOUNTER — Inpatient Hospital Stay (HOSPITAL_BASED_OUTPATIENT_CLINIC_OR_DEPARTMENT_OTHER): Payer: Medicare Other | Admitting: Oncology

## 2017-08-01 ENCOUNTER — Inpatient Hospital Stay: Payer: Medicare Other

## 2017-08-01 VITALS — BP 125/65 | HR 62 | Temp 97.1°F | Resp 18 | Ht 67.0 in | Wt 187.0 lb

## 2017-08-01 DIAGNOSIS — N189 Chronic kidney disease, unspecified: Secondary | ICD-10-CM | POA: Insufficient documentation

## 2017-08-01 DIAGNOSIS — E538 Deficiency of other specified B group vitamins: Secondary | ICD-10-CM

## 2017-08-01 DIAGNOSIS — D631 Anemia in chronic kidney disease: Secondary | ICD-10-CM | POA: Insufficient documentation

## 2017-08-01 LAB — CBC WITH DIFFERENTIAL/PLATELET
BASOS ABS: 0 10*3/uL (ref 0–0.1)
BASOS PCT: 0 %
EOS PCT: 3 %
Eosinophils Absolute: 0.3 10*3/uL (ref 0–0.7)
HCT: 29 % — ABNORMAL LOW (ref 35.0–47.0)
Hemoglobin: 9.7 g/dL — ABNORMAL LOW (ref 12.0–16.0)
LYMPHS PCT: 31 %
Lymphs Abs: 2.7 10*3/uL (ref 1.0–3.6)
MCH: 28.1 pg (ref 26.0–34.0)
MCHC: 33.5 g/dL (ref 32.0–36.0)
MCV: 83.9 fL (ref 80.0–100.0)
MONO ABS: 0.7 10*3/uL (ref 0.2–0.9)
Monocytes Relative: 8 %
Neutro Abs: 5.1 10*3/uL (ref 1.4–6.5)
Neutrophils Relative %: 58 %
PLATELETS: 275 10*3/uL (ref 150–440)
RBC: 3.45 MIL/uL — AB (ref 3.80–5.20)
RDW: 13.1 % (ref 11.5–14.5)
WBC: 8.8 10*3/uL (ref 3.6–11.0)

## 2017-08-01 LAB — IRON AND TIBC
Iron: 48 ug/dL (ref 28–170)
Saturation Ratios: 19 % (ref 10.4–31.8)
TIBC: 252 ug/dL (ref 250–450)
UIBC: 204 ug/dL

## 2017-08-01 LAB — FERRITIN: FERRITIN: 321 ng/mL — AB (ref 11–307)

## 2017-08-01 LAB — VITAMIN B12: Vitamin B-12: 1161 pg/mL — ABNORMAL HIGH (ref 180–914)

## 2017-08-01 NOTE — Progress Notes (Addendum)
Hematology/Oncology Consult note [facility]  [phone] [phone]  Patient Care Team: [name], MD as PCP - General (Family Medicine) Winfred Leeds Basilia Jumbo, MD as Referring Physician (Orthopedic Surgery) Antonieta Iba, MD as Consulting Physician (Cardiology)   Name of the patient: [name] Cooper  [id number]  June 01, 1940   Date of visit: 08/01/17  Diagnosis- b12 deficiency Anemia of chronic kidney disease  Chief complaint/ Reason for visit- routine f/u of anemia  Heme/Onc history: Patient is a 77 year old female who was seen by Dr. Hezzie Bump in the past for evaluation of anemia. Her hemoglobin has been ranging between 9.9-10.8 over the last couple of years. She is also noted to have chronic kidney disease with a creatinine ranging from 1.5-2. 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   Review of blood work from October 2017 was as follows: methylmalonic acid was elevated at 778. TIBC was low and ferritin was elevated at 399. spep showed polyclonal gammopathy. Cbc showed white count of 9.2, H&H of 10.1/30.6 and a platelet count of 297. Reticulocyte count was low at 0.8 in appropriate for the degree of anemia. Normal immunofixation pattern was noted in urine  Patient was started on monthly B12 injections given elevated MMA  Interval history- she has mild chronic fatigue. Denies any blood in stool or urine. Reports ongoing dry cough for couple of weeks which has not improved after setroids and antibiotics  ECOG PS- 1 Pain scale- 0   Review of systems- Review of Systems  Constitutional: Positive for malaise/fatigue. Negative for chills, fever and weight loss.  HENT: Negative for congestion, ear discharge and nosebleeds.   Eyes: Negative for blurred vision.  Respiratory: Negative for cough, hemoptysis, sputum production, shortness of breath and  wheezing.   Cardiovascular: Negative for chest pain, palpitations, orthopnea and claudication.  Gastrointestinal: Negative for abdominal pain, blood in stool, constipation, diarrhea, heartburn, melena, nausea and vomiting.  Genitourinary: Negative for dysuria, flank pain, frequency, hematuria and urgency.  Musculoskeletal: Negative for back pain, joint pain and myalgias.  Skin: Negative for rash.  Neurological: Negative for dizziness, tingling, focal weakness, seizures, weakness and headaches.  Endo/Heme/Allergies: Does not bruise/bleed easily.  Psychiatric/Behavioral: Negative for depression and suicidal ideas. The patient does not have insomnia.       Allergies  Allergen Reactions  . Sulfa Antibiotics Anaphylaxis     Past Medical History:  Diagnosis Date  . Arthritis    left hip, left ankle  . Cholelithiases   . CKD (chronic kidney disease), stage II   . Coronary artery disease    a. cardiac cath 01/2011: mid LAD stent patent, mild lumenal irreg's, nl EF 55%, no AS/MR  . Fibromyalgia   . Frozen shoulder   . History of kidney stones   . HTN (hypertension)    controlled with medication;   . Hyperlipidemia    a. statin intolerant  . Iron deficiency   . Thrombocytopenia (HCC)   . UTI (urinary tract infection)   . Venous stasis      Past Surgical History:  Procedure Laterality Date  . CARDIAC CATHETERIZATION     stents placed in mid LAD  . CERVICAL SPINE SURGERY    . FETAL BLOOD TRANSFUSION  2014  . Frozen Shoulder    . KIDNEY STONE SURGERY    . LEG SURGERY     broke her leg     Social History  Socioeconomic History  . Marital status: Widowed    Spouse name: Not on file  . Number of children: Not on file  . Years of education: Not on file  . Highest education level: Not on file  Occupational History  . Not on file  Social Needs  . Financial resource strain: Not on file  . Food insecurity:    Worry: Not on file    Inability: Not on file  .  Transportation needs:    Medical: Not on file    Non-medical: Not on file  Tobacco Use  . Smoking status: Never Smoker  . Smokeless tobacco: Never Used  Substance and Sexual Activity  . Alcohol use: No  . Drug use: No  . Sexual activity: Not on file  Lifestyle  . Physical activity:    Days per week: Not on file    Minutes per session: Not on file  . Stress: Not on file  Relationships  . Social connections:    Talks on phone: Not on file    Gets together: Not on file    Attends religious service: Not on file    Active member of club or organization: Not on file    Attends meetings of clubs or organizations: Not on file    Relationship status: Not on file  . Intimate partner violence:    Fear of current or ex partner: Not on file    Emotionally abused: Not on file    Physically abused: Not on file    Forced sexual activity: Not on file  Other Topics Concern  . Not on file  Social History Narrative  . Not on file    Family History  Problem Relation Age of Onset  . Hypertension Mother   . Heart attack Mother   . Heart attack Father   . Heart disease Brother        h/o CABG  . Diabetes Brother   . Kidney disease Brother   . Kidney cancer Neg Hx   . Bladder Cancer Neg Hx      Current Outpatient Medications:  .  Acetaminophen (TYLENOL 8 HOUR PO), Take 2 tablets by mouth every 8 (eight) hours as needed. , Disp: , Rfl:  .  aspirin EC 81 MG tablet, Take 81 mg by mouth every other day. , Disp: 90 tablet, Rfl: 3 .  ezetimibe (ZETIA) 10 MG tablet, Take 1 tablet (10 mg total) by mouth daily., Disp: 90 tablet, Rfl: 3 .  losartan-hydrochlorothiazide (HYZAAR) 100-25 MG tablet, take 1 tablet by mouth once daily, Disp: 30 tablet, Rfl: 3 .  temazepam (RESTORIL) 15 MG capsule, take 1 capsule by mouth at bedtime if needed, Disp: , Rfl:  .  vitamin B-12 (CYANOCOBALAMIN) 1000 MCG tablet, Take 1 tablet (1,000 mcg total) by mouth daily., Disp: 30 tablet, Rfl: 5  Physical exam:  Vitals:    08/01/17 0835  BP: 125/65  Pulse: 62  Resp: 18  Temp: (!) 97.1 F (36.2 C)  TempSrc: Tympanic  SpO2: 100%  Weight: 187 lb (84.8 kg)  Height:  (1.702 m)   Physical Exam  Constitutional: She is oriented to person, place, and time. She appears well-developed and well-nourished.  HENT:  Head: Normocephalic and atraumatic.  Eyes: Pupils are equal, round, and reactive to light. EOM are normal.  Neck: Normal range of motion.  Cardiovascular: Normal rate, regular rhythm and normal heart sounds.  Pulmonary/Chest: Effort normal and breath sounds normal.  Abdominal: Soft. Bowel sounds are  normal.  Neurological: She is alert and oriented to person, place, and time.  Skin: Skin is warm and dry.     CMP Latest Ref Rng & Units 01/31/2017  Glucose 65 - 99 mg/dL 409(W)  BUN 6 - 20 mg/dL 11(B)  Creatinine 1.47 - 1.00 mg/dL 8.29(F)  Sodium 621 - 308 mmol/L 138  Potassium 3.5 - 5.1 mmol/L 3.6  Chloride 101 - 111 mmol/L 106  CO2 22 - 32 mmol/L 24  Calcium 8.9 - 10.3 mg/dL 9.1  Total Protein 6.5 - 8.1 g/dL 7.9  Total Bilirubin 0.3 - 1.2 mg/dL 0.4  Alkaline Phos 38 - 126 U/L 126  AST 15 - 41 U/L 26  ALT 14 - 54 U/L 13(L)   CBC Latest Ref Rng & Units 08/01/2017  WBC 3.6 - 11.0 K/uL 8.8  Hemoglobin 12.0 - 16.0 g/dL 6.5(H)  Hematocrit 84.6 - 47.0 % 29.0(L)  Platelets 150 - 440 K/uL 275      Assessment and plan- Patient is a 77 y.o. female with anemia of chronic kidney disease  Hb stable around 9-10. No need for epo at this time.   Repeat cbc, cmp ferritin and iron studies and b12 in 6 months. I will see her back in 6 months. Interim cbc with diff in 3 months for possible epo  Dry cough- management per pcp. She may need CXR/ Ct if symptoms persist   Visit Diagnosis 1. B12 deficiency   2. Anemia of chronic renal failure, unspecified CKD stage      Dr. Owens Shark, MD, MPH [facility] [phone] 08/01/2017 10:37 AM

## 2017-08-01 NOTE — Addendum Note (Signed)
Addended by: Corene Cornea on: 08/01/2017 02:24 PM   Modules accepted: Orders

## 2017-08-01 NOTE — Progress Notes (Signed)
No new changes noted today 

## 2017-10-31 ENCOUNTER — Inpatient Hospital Stay: Payer: Medicare Other

## 2017-10-31 ENCOUNTER — Inpatient Hospital Stay: Payer: Medicare Other | Attending: Oncology

## 2017-12-30 ENCOUNTER — Other Ambulatory Visit: Payer: Self-pay | Admitting: Cardiovascular Disease

## 2018-02-04 ENCOUNTER — Inpatient Hospital Stay: Payer: Medicare Other | Admitting: Oncology

## 2018-02-04 ENCOUNTER — Inpatient Hospital Stay: Payer: Medicare Other

## 2018-03-10 NOTE — Progress Notes (Signed)
Cardiology Office Note  Date:  03/11/2018   ID:  [name] LittenDelores Cooper, DOB 05-24-1940, MRN [id number]  PCP:  [name] Cooper, James, MD   Chief Complaint  Patient presents with  . other    12 mo follow up.SOB off and on with exertion.Medications reviewed verbally.     HPI:  [name] is a very pleasant 77 year old woman with history of  coronary artery disease,  stent placed to her mid LAD with repeat stenting for in-stent restenosis Over 10 years ago,  hyperlipidemia,  hypertension,  fibromyalgia and diffuse muscle aches and arthritis  who presents for Followup of her coronary artery disease. Moderate bilateral carotid arterial disease  works as a crossing guard  In follow-up today she reports that she feels well She does have some shortness of breath with overexertion Feels it is stable, not a new thing No regular exercise program but does try to stay busy  Not on asa on a regular basis, unclear why  Per the notes, taking zetia, losartan, BP stable at home  Lab work reviewed with her Total chol 250, LDL 158 On zetia Statin myalgia  Discussed high cholesterol, Was unaware of numbers Does not want a statin Had myalgias on crestor and lipitor  Other lab work reviewed HCT 29 in 07/2017  Denies any chest pain  Chronic diffuse tenderness of muscles Previously seen by rheumatology, completed course of prednisone with no significant improvement in her symptoms.  She has bilateral 40-59% carotid arterial disease, bruit on the right Last documented 2015  EKG personally reviewed by myself on todays visit Shows normal sinus rhythm rate 62 bpm no significant ST or T wave changes Rare PVC  Other past medical history Cardiac catheter [facility] for chest discomfort earlier in the year showed patent LAD stent with no other significant stenoses. Medical management recommended   PMH:   has a past medical history of Arthritis, Cholelithiases, CKD (chronic kidney disease), stage II,  Coronary artery disease, Fibromyalgia, Frozen shoulder, History of kidney stones, HTN (hypertension), Hyperlipidemia, Iron deficiency, Thrombocytopenia (HCC), UTI (urinary tract infection), and Venous stasis.  PSH:    Past Surgical History:  Procedure Laterality Date  . CARDIAC CATHETERIZATION     stents placed in mid LAD  . CERVICAL SPINE SURGERY    . FETAL BLOOD TRANSFUSION  2014  . Frozen Shoulder    . KIDNEY STONE SURGERY    . LEG SURGERY     broke her leg     Current Outpatient Medications  Medication Sig Dispense Refill  . Acetaminophen (TYLENOL 8 HOUR PO) Take 2 tablets by mouth every 8 (eight) hours as needed.     . ezetimibe (ZETIA) 10 MG tablet Take 1 tablet (10 mg total) by mouth daily. 90 tablet 3  . losartan-hydrochlorothiazide (HYZAAR) 100-25 MG tablet TAKE 1 TABLET BY MOUTH ONCE DAILY 90 tablet 3  . temazepam (RESTORIL) 15 MG capsule take 1 capsule by mouth at bedtime if needed    . aspirin EC 81 MG tablet Take 81 mg by mouth every other day.  90 tablet 3  . vitamin B-12 (CYANOCOBALAMIN) 1000 MCG tablet Take 1 tablet (1,000 mcg total) by mouth daily. (Patient not taking: Reported on 03/11/2018) 30 tablet 5   No current facility-administered medications for this visit.      Allergies:   Sulfa antibiotics   Social History:  The patient  reports that she has never smoked. She has never used smokeless tobacco. She reports that she does not drink alcohol  or use drugs.   Family History:   family history includes Diabetes in her brother; Heart attack in her father and mother; Heart disease in her brother; Hypertension in her mother; Kidney disease in her brother.    Review of Systems: Review of Systems  Constitutional: Negative.   Respiratory: Positive for shortness of breath.   Cardiovascular: Negative.   Gastrointestinal: Negative.   Musculoskeletal: Negative.        Leg weakness, gait instability, muscle pain in the legs  Neurological: Negative.    Psychiatric/Behavioral: Negative.   All other systems reviewed and are negative.    PHYSICAL EXAM: VS:  BP 128/64 (BP Location: Left Arm, Patient Position: Sitting, Cuff Size: Normal)   Pulse 62   Ht 5\' 7"  (1.702 m)   Wt 187 lb (84.8 kg)   BMI 29.29 kg/m  , BMI Body mass index is 29.29 kg/m. Constitutional:  oriented to person, place, and time. No distress.  HENT:  Head: Grossly normal Eyes:  no discharge. No scleral icterus.  Neck: No JVD, no carotid bruits  Cardiovascular: Regular rate and rhythm, no murmurs appreciated Pulmonary/Chest: Clear to auscultation bilaterally, no wheezes or rails Abdominal: Soft.  no distension.  no tenderness.  Musculoskeletal: Normal range of motion Neurological:  normal muscle tone. Coordination normal. No atrophy Skin: Skin warm and dry Psychiatric: normal affect, pleasant   Recent Labs: 08/01/2017: Hemoglobin 9.7; Platelets 275    Lipid Panel Lab Results  Component Value Date   CHOL 214 (H) 12/31/2014   HDL 65 12/31/2014   LDLCALC 131 (H) 12/31/2014   TRIG 88 12/31/2014      Wt Readings from Last 3 Encounters:  03/11/18 187 lb (84.8 kg)  08/01/17 187 lb (84.8 kg)  04/17/17 179 lb 12 oz (81.5 kg)       ASSESSMENT AND PLAN:  Coronary artery disease of native artery of native heart with stable angina pectoris (HCC) - Plan: EKG 12-Lead Chronic mild shortness of breath with heavy exertion but nothing new Recommended regular walking program Recommended weight loss through low carbohydrate diet Cholesterol markedly elevated and unable to tolerate Crestor and Lipitor Long discussion concerning starting a PCSK9 inhibitor She has underlying coronary disease, PAD/carotid stenosis  Bilateral carotid artery stenosis Moderate disease,  Stressed the importance of better cholesterol control Request placed for PCSK9 inhibitor  Essential hypertension, benign Blood pressure is well controlled on today's visit. No changes made to the  medications. Stable  Mixed hyperlipidemia  Does not want a statin given history of myalgias Continue Zetia 10 mg daily She may benefit from PCSK9 inhibitors   Disposition:   F/U  12 months   Total encounter time more than 25 minutes  Greater than 50% was spent in counseling and coordination of care with the patient    Orders Placed This Encounter  Procedures  . EKG 12-Lead     Signed, Dossie Arbour, M.D., Ph.D. 03/11/2018  [facility] [place], [place] [phone]

## 2018-03-11 ENCOUNTER — Encounter: Payer: Self-pay | Admitting: Cardiovascular Disease

## 2018-03-11 ENCOUNTER — Ambulatory Visit: Payer: Medicare Other | Admitting: Cardiovascular Disease

## 2018-03-11 VITALS — BP 128/64 | HR 62 | Ht 67.0 in | Wt 187.0 lb

## 2018-03-11 DIAGNOSIS — I25118 Atherosclerotic heart disease of native coronary artery with other forms of angina pectoris: Secondary | ICD-10-CM

## 2018-03-11 DIAGNOSIS — I6523 Occlusion and stenosis of bilateral carotid arteries: Secondary | ICD-10-CM

## 2018-03-11 DIAGNOSIS — I10 Essential (primary) hypertension: Secondary | ICD-10-CM

## 2018-03-11 DIAGNOSIS — E785 Hyperlipidemia, unspecified: Secondary | ICD-10-CM

## 2018-03-11 DIAGNOSIS — I6529 Occlusion and stenosis of unspecified carotid artery: Secondary | ICD-10-CM

## 2018-03-11 NOTE — Patient Instructions (Addendum)
Medication Instructions:   We will send a message to the pharmacy in [place] To ask about repatha/praluent  If you need a refill on your cardiac medications before your next appointment, please call your pharmacy.    Lab work: No new labs needed   If you have labs (blood work) drawn today and your tests are completely normal, you will receive your results only by: Marland Kitchen. MyChart Message (if you have MyChart) OR . A paper copy in the mail If you have any lab test that is abnormal or we need to change your treatment, we will call you to review the results.   Testing/Procedures:  We will order a carotid ultrasound for carotid stenosis  Follow-Up: At [facility], you and your health needs are our priority.  As part of our continuing mission to provide you with exceptional heart care, we have created designated Provider Care Teams.  These Care Teams include your primary Cardiologist (physician) and Advanced Practice Providers (APPs -  Physician Assistants and Nurse Practitioners) who all work together to provide you with the care you need, when you need it.  . You will need a follow up appointment in 12 months .   Please call our office 2 months in advance to schedule this appointment.    . Providers on your designated Care Team:   . Nicolasa Duckinghristopher Berge, NP . Eula Listenyan Dunn, PA-C . Marisue IvanJacquelyn Visser, PA-C  Any Other Special Instructions Will Be Listed Below (If Applicable).  For educational health videos Log in to : [email or web address] Or : [email or web address], password : [email or web address]

## 2018-03-27 ENCOUNTER — Telehealth: Payer: Self-pay | Admitting: Cardiovascular Disease

## 2018-03-27 MED ORDER — EVOLOCUMAB 140 MG/ML ~~LOC~~ SOAJ: 140.0000 mg | SUBCUTANEOUS | 6 refills | Status: DC

## 2018-03-27 NOTE — Telephone Encounter (Signed)
Spoke with patient and reviewed that we would submit her medication to pharmacy and work on prior authorization. Reviewed that if cost is high to please call us back and we can assist with filling out patient assistance forms. She verbalized understanding with no further questions at this time.

## 2018-03-28 ENCOUNTER — Other Ambulatory Visit: Payer: Self-pay | Admitting: *Deleted

## 2018-03-28 NOTE — Telephone Encounter (Signed)
Pt has been approved for Repatha 03/28/2018-09/26/2018.

## 2018-03-28 NOTE — Telephone Encounter (Signed)
Pt requiring PA for Repatha Inj. PA has been sent through Covermymeds. Awaiting Approval.  [employer] is reviewing your PA request. Typically an electronic response will be received within 72 hours. To check for an update later, open this request from your dashboard.

## 2018-04-03 NOTE — Telephone Encounter (Signed)
Patient calling to check status of PA and to find out how much this is going to be.

## 2018-04-03 NOTE — Telephone Encounter (Signed)
Spoke with [place] at [employer].  Made her aware that patient Prior Auth for repatha is approved.  Cost will be $195 per month.  Tonya told me to make patient aware that if she can not pay that them to please contact them so they do not order it.   Will make patient aware.

## 2018-04-03 NOTE — Telephone Encounter (Signed)
Spoke with patient.  Made her aware that her Prior Auth was approved and that the medication is going to be $195 per month.  Patient stated that was impossible for her to pay that.   Please advise.

## 2018-04-04 NOTE — Telephone Encounter (Signed)
We can complete application for patient assistance and see if she could get approval. I will work on this application and contact patient with the information.

## 2018-04-04 NOTE — Telephone Encounter (Signed)
Spoke with patient and reviewed answers needed on patient assistance application and requested that she please come by the office when possible to sign the paperwork and that we would then fax it to the assistance program. She verbalized understanding with no further questions at this time. Paperwork given to Iva for next week.

## 2018-04-10 NOTE — Telephone Encounter (Signed)
Patient came in and signed paperwork for assistance and will fax that information over to assistance program.

## 2018-04-10 NOTE — Telephone Encounter (Signed)
Spoke with patient and reviewed that I still have paperwork here for her to sign. She states that she forgot and will come by today to get that signed around lunch time. Let her know that I would be here and she can just ask for me. She was appreciative for the call reminder with no further questions at this time.

## 2018-04-23 NOTE — Telephone Encounter (Signed)
Spoke with patient and reviewed that updated application has been completed and just need her signature in order to fax to assistance program. She verbalized understanding with no further questions at this time.

## 2018-04-24 NOTE — Telephone Encounter (Signed)
Patient came in and signed application and faxed all to patient assistance program then filed in samples closet file

## 2018-05-02 NOTE — Telephone Encounter (Signed)
Patient received a msg from [employer] assistance for more information to consider approval.  Please call.

## 2018-05-08 NOTE — Telephone Encounter (Signed)
Patient calling to check on status of form completion States that she received a letter that they were needing more information  Please call to discuss

## 2018-05-08 NOTE — Telephone Encounter (Signed)
Spoke with patient and she reports receiving letter from company for additional information. She is coming in tomorrow for ultrasound of her neck and states that she will bring it with her. Let her know that I will be happy to look at it and assist if possible.

## 2018-05-09 ENCOUNTER — Ambulatory Visit (INDEPENDENT_AMBULATORY_CARE_PROVIDER_SITE_OTHER): Payer: Medicare Other

## 2018-05-09 ENCOUNTER — Telehealth: Payer: Self-pay

## 2018-05-09 DIAGNOSIS — I6529 Occlusion and stenosis of unspecified carotid artery: Secondary | ICD-10-CM | POA: Diagnosis not present

## 2018-05-09 NOTE — Telephone Encounter (Signed)
PA has already been approved.  03/28/2018 through 09/26/2018 

## 2018-05-09 NOTE — Telephone Encounter (Signed)
Prior Auth started through [employer] for Repatha   [name] Cooper (Key: [id number])  Repatha SureClick 140MG /ML auto-injectors

## 2018-05-09 NOTE — Telephone Encounter (Signed)
Patient came in today and brought letter. They are requesting prior authorization or letter with any information to determine if she qualifies for this assistance. Will have [place] review to see if she can in some way assist with this.

## 2018-05-09 NOTE — Telephone Encounter (Signed)
PA has already been approved.  03/28/2018 through 09/26/2018

## 2018-05-12 NOTE — Telephone Encounter (Signed)
Prior authorization faxed to assistance program and filed in samples closet.

## 2018-05-23 NOTE — Telephone Encounter (Signed)
Patient calling to speak with Elita Quick States that she was approved for the repatha but she will be unable to afford Please call to discuss

## 2018-05-26 NOTE — Telephone Encounter (Signed)
Spoke with patient and she called the assistance program. She states that it went through several recordings and asked multiple questions. Then they wanted her to fill out additional forms and she is just not willing to go through all of this again. She states that she would like to just stay on the zetia for now and not pursue this new medication. She was very appreciative for all of the help with trying to assist her with getting this. Instructed her to please let us know if she should have any further questions.

## 2018-05-26 NOTE — Telephone Encounter (Signed)
Spoke with patient and she reports that the medication will cost her $140.00 per month. She was not sure of any deductible and reviewed that cost should be roughly $49 per month after that deductible has been met. Will check her patient assistance application to see if they are able to provide any assistance and will follow up with her on this. She was appreciative for the call with no further questions at this time.

## 2018-05-26 NOTE — Telephone Encounter (Signed)
Spoke with patient and reviewed that she will need to call Repatha Ready at [phone] to see what other programs are available. She verbalized understanding and will call them today. Advised that I would call her back later today to see if she has success with getting some assistance.

## 2018-05-26 NOTE — Addendum Note (Signed)
Addended by: Bryna Colander on: 05/26/2018 02:42 PM   Modules accepted: Orders

## 2018-08-29 ENCOUNTER — Telehealth: Payer: Self-pay

## 2018-08-29 NOTE — Telephone Encounter (Signed)
Received a request from Covermymeds for a renewal for the Prior authorization for Repatha. Spoke with the patient she stated she has never started the Repatha due to cost, even with prior authorization.  She has spoken with Pam (Dr. Windell Hummingbird nurse ) and was going to continue on Zetia.

## 2018-09-12 ENCOUNTER — Other Ambulatory Visit: Payer: Self-pay | Admitting: Cardiovascular Disease

## 2018-09-12 DIAGNOSIS — I6523 Occlusion and stenosis of bilateral carotid arteries: Secondary | ICD-10-CM

## 2019-01-28 ENCOUNTER — Other Ambulatory Visit (INDEPENDENT_AMBULATORY_CARE_PROVIDER_SITE_OTHER): Payer: Self-pay | Admitting: Vascular Surgery

## 2019-01-28 DIAGNOSIS — M79604 Pain in right leg: Secondary | ICD-10-CM

## 2019-01-28 DIAGNOSIS — M79605 Pain in left leg: Secondary | ICD-10-CM

## 2019-01-29 ENCOUNTER — Ambulatory Visit (INDEPENDENT_AMBULATORY_CARE_PROVIDER_SITE_OTHER): Payer: Medicare Other | Admitting: Vascular Surgery

## 2019-01-29 ENCOUNTER — Encounter (INDEPENDENT_AMBULATORY_CARE_PROVIDER_SITE_OTHER): Payer: Self-pay | Admitting: Vascular Surgery

## 2019-01-29 ENCOUNTER — Other Ambulatory Visit: Payer: Self-pay

## 2019-01-29 ENCOUNTER — Ambulatory Visit (INDEPENDENT_AMBULATORY_CARE_PROVIDER_SITE_OTHER): Payer: Medicare Other

## 2019-01-29 VITALS — BP 153/63 | HR 65 | Resp 16 | Ht 67.0 in | Wt 188.0 lb

## 2019-01-29 DIAGNOSIS — M79604 Pain in right leg: Secondary | ICD-10-CM | POA: Diagnosis not present

## 2019-01-29 DIAGNOSIS — I6529 Occlusion and stenosis of unspecified carotid artery: Secondary | ICD-10-CM

## 2019-01-29 DIAGNOSIS — I25118 Atherosclerotic heart disease of native coronary artery with other forms of angina pectoris: Secondary | ICD-10-CM

## 2019-01-29 DIAGNOSIS — I70221 Atherosclerosis of native arteries of extremities with rest pain, right leg: Secondary | ICD-10-CM | POA: Diagnosis not present

## 2019-01-29 DIAGNOSIS — I10 Essential (primary) hypertension: Secondary | ICD-10-CM | POA: Diagnosis not present

## 2019-01-29 DIAGNOSIS — M79605 Pain in left leg: Secondary | ICD-10-CM | POA: Diagnosis not present

## 2019-01-29 DIAGNOSIS — E785 Hyperlipidemia, unspecified: Secondary | ICD-10-CM

## 2019-01-29 NOTE — Progress Notes (Signed)
MRN : [id number]  [name] Cooper is a 78 y.o. (March 01, 1941) female who presents with chief complaint of  Chief Complaint  Patient presents with  . Follow-up    ultrasound  .  History of Present Illness:    The patient is seen for evaluation of painful lower extremities and diminished pulses. Patient notes the pain is always associated with activity and is very consistent day today. Typically, the pain occurs at less than one block, progress is as activity continues to the point that the patient must stop walking. Resting including standing still for several minutes allowed resumption of the activity and the ability to walk a similar distance before stopping again. Uneven terrain and inclined shorten the distance. The pain has been progressive over the past several years. The patient states the inability to walk is now having a profound negative impact on quality of life and daily activities.  The patient is describing rest pain of the right leg but denies dangling of an extremity off the side of the bed during the night for relief.  No open wounds or sores at this time. No prior interventions or surgeries.  There is a history of back problems and DJD of the lumbar sacral spine.   The patient denies changes in claudication symptoms or new rest pain symptoms.  No new ulcers or wounds of the foot.  The patient's blood pressure has been stable and relatively well controlled. The patient denies amaurosis fugax or recent TIA symptoms. There are no recent neurological changes noted. The patient denies history of DVT, PE or superficial thrombophlebitis. The patient denies recent episodes of angina or shortness of breath.   Current Meds  Medication Sig  . Acetaminophen (TYLENOL 8 HOUR PO) Take 2 tablets by mouth every 8 (eight) hours as needed.   Marland Kitchen aspirin EC 81 MG tablet Take 81 mg by mouth every other day.   . losartan-hydrochlorothiazide (HYZAAR) 100-25 MG tablet TAKE 1 TABLET BY MOUTH  ONCE DAILY  . temazepam (RESTORIL) 15 MG capsule take 1 capsule by mouth at bedtime if needed    Past Medical History:  Diagnosis Date  . Arthritis    left hip, left ankle  . Cholelithiases   . CKD (chronic kidney disease), stage II   . Coronary artery disease    a. cardiac cath 01/2011: mid LAD stent patent, mild lumenal irreg's, nl EF 55%, no AS/MR  . Fibromyalgia   . Frozen shoulder   . History of kidney stones   . HTN (hypertension)    controlled with medication;   . Hyperlipidemia    a. statin intolerant  . Iron deficiency   . Thrombocytopenia (HCC)   . UTI (urinary tract infection)   . Venous stasis     Past Surgical History:  Procedure Laterality Date  . CARDIAC CATHETERIZATION     stents placed in mid LAD  . CERVICAL SPINE SURGERY    . FETAL BLOOD TRANSFUSION  2014  . Frozen Shoulder    . KIDNEY STONE SURGERY    . LEG SURGERY     broke her leg     Social History Social History   Tobacco Use  . Smoking status: Never Smoker  . Smokeless tobacco: Never Used  Substance Use Topics  . Alcohol use: No  . Drug use: No    Family History Family History  Problem Relation Age of Onset  . Hypertension Mother   . Heart attack Mother   . Heart attack Father   .  Heart disease Brother        h/o CABG  . Diabetes Brother   . Kidney disease Brother   . Kidney cancer Neg Hx   . Bladder Cancer Neg Hx   No family history of bleeding/clotting disorders, porphyria or autoimmune disease   Allergies  Allergen Reactions  . Sulfa Antibiotics Anaphylaxis     REVIEW OF SYSTEMS (Negative unless checked)  Constitutional: [] Weight loss  [] Fever  [] Chills Cardiac: [] Chest pain   [] Chest pressure   [] Palpitations   [] Shortness of breath when laying flat   [] Shortness of breath with exertion. Vascular:  [x] Pain in legs with walking   [x] Pain in legs at rest  [] History of DVT   [] Phlebitis   [] Swelling in legs   [] Varicose veins   [] Non-healing ulcers Pulmonary:   [] Uses  home oxygen   [] Productive cough   [] Hemoptysis   [] Wheeze  [] COPD   [] Asthma Neurologic:  [] Dizziness   [] Seizures   [] History of stroke   [] History of TIA  [] Aphasia   [] Vissual changes   [] Weakness or numbness in arm   [] Weakness or numbness in leg Musculoskeletal:   [] Joint swelling   [x] Joint pain   [x] Low back pain Hematologic:  [] Easy bruising  [] Easy bleeding   [] Hypercoagulable state   [] Anemic Gastrointestinal:  [] Diarrhea   [] Vomiting  [] Gastroesophageal reflux/heartburn   [] Difficulty swallowing. Genitourinary:  [] Chronic kidney disease   [] Difficult urination  [] Frequent urination   [] Blood in urine Skin:  [] Rashes   [] Ulcers  Psychological:  [] History of anxiety   []  History of major depression.  Physical Examination  Vitals:   01/29/19 1459  BP: (!) 153/63  Pulse: 65  Resp: 16  Weight: 188 lb (85.3 kg)  Height: 5\' 7"  (1.702 m)   Body mass index is 29.44 kg/m. Gen: WD/WN, NAD Head: [place]/AT, No temporalis wasting.  Ear/Nose/Throat: Hearing grossly intact, nares w/o erythema or drainage, poor dentition Eyes: PER, EOMI, sclera nonicteric.  Neck: Supple, no masses.  No bruit or JVD.  Pulmonary:  Good air movement, clear to auscultation bilaterally, no use of accessory muscles.  Cardiac: RRR, normal S1, S2, no Murmurs. Vascular: right foot is pale and cool to touch with delayed cap refill  Vessel Right Left  Radial Palpable Palpable  PT Not Palpable Not Palpable  DP Not Palpable Not Palpable  Gastrointestinal: soft, non-distended. No guarding/no peritoneal signs.  Musculoskeletal: M/S 5/5 throughout.  No deformity or atrophy.  Neurologic: CN 2-12 intact. Pain and light touch intact in extremities.  Symmetrical.  Speech is fluent. Motor exam as listed above. Psychiatric: Judgment intact, Mood & affect appropriate for pt's clinical situation. Dermatologic: No rashes or ulcers noted.  No changes consistent with cellulitis. Lymph : No Cervical lymphadenopathy, no  lichenification or skin changes of chronic lymphedema.  CBC Lab Results  Component Value Date   WBC 8.8 08/01/2017   HGB 9.7 (L) 08/01/2017   HCT 29.0 (L) 08/01/2017   MCV 83.9 08/01/2017   PLT 275 08/01/2017    BMET    Component Value Date/Time   NA 138 01/31/2017 0921   NA 141 12/28/2013 1100   K 3.6 01/31/2017 0921   K 3.6 12/28/2013 1100   CL 106 01/31/2017 0921   CL 107 12/28/2013 1100   CO2 24 01/31/2017 0921   CO2 28 12/28/2013 1100   GLUCOSE 134 (H) 01/31/2017 0921   GLUCOSE 91 12/28/2013 1100   BUN 31 (H) 01/31/2017 0921   BUN 22 (H) 12/28/2013 1100  CREATININE 1.44 (H) 01/31/2017 0921   CREATININE 1.47 (H) 12/28/2013 1100   CALCIUM 9.1 01/31/2017 0921   CALCIUM 9.1 12/28/2013 1100   GFRNONAA 34 (L) 01/31/2017 0921   GFRNONAA 37 (L) 12/28/2013 1100   GFRNONAA 26 (L) 08/05/2012 0518   GFRAA 40 (L) 01/31/2017 0921   GFRAA 45 (L) 12/28/2013 1100   GFRAA 30 (L) 08/05/2012 0518   CrCl cannot be calculated (Patient's most recent lab result is older than the maximum 21 days allowed.).  COAG Lab Results  Component Value Date   INR 0.9 05/10/2012   INR 1.1 01/22/2011    Radiology Vas [place] Abi With/wo Tbi  Result Date: 01/29/2019 LOWER EXTREMITY DOPPLER STUDY Other Factors: Bilateral leg pain.  Comparison Study: none Performing Technologist: Salvadore Farber RVT  Examination Guidelines: A complete evaluation includes at minimum, Doppler waveform signals and systolic blood pressure reading at the level of bilateral brachial, anterior tibial, and posterior tibial arteries, when vessel segments are accessible. Bilateral testing is considered an integral part of a complete examination. Photoelectric Plethysmograph (PPG) waveforms and toe systolic pressure readings are included as required and additional duplex testing as needed. Limited examinations for reoccurring indications may be performed as noted.  ABI Findings: +---------+------------------+-----+----------+--------+  Right    Rt Pressure (mmHg)IndexWaveform  Comment  +---------+------------------+-----+----------+--------+ Brachial 151                                       +---------+------------------+-----+----------+--------+ ATA      183               1.17 monophasic         +---------+------------------+-----+----------+--------+ PTA      185               1.19 monophasic         +---------+------------------+-----+----------+--------+ Great Toe70                0.45 Normal             +---------+------------------+-----+----------+--------+ +---------+------------------+-----+----------+-------+ Left     Lt Pressure (mmHg)IndexWaveform  Comment +---------+------------------+-----+----------+-------+ Brachial 156                                      +---------+------------------+-----+----------+-------+ ATA      111               0.71 monophasic        +---------+------------------+-----+----------+-------+ PTA      194               1.24 monophasic        +---------+------------------+-----+----------+-------+ Great Toe35                0.22 Abnormal          +---------+------------------+-----+----------+-------+ +-------+-----------+-----------+------------+------------+ ABI/TBIToday's ABIToday's TBIPrevious ABIPrevious TBI +-------+-----------+-----------+------------+------------+ Right  1.19       .45                                 +-------+-----------+-----------+------------+------------+ Left   1.24       .22                                 +-------+-----------+-----------+------------+------------+  Summary:  Right: Resting right ankle-brachial index is within normal range. No evidence of significant right lower extremity arterial disease. The right toe-brachial index is abnormal. ABIs are unreliable. TBI information suggests moderate disease. Left: Resting left ankle-brachial index is within normal range. No evidence of significant  left lower extremity arterial disease. The left toe-brachial index is abnormal. ABIs are unreliable. TBI information suggests severe disease.  *See table(s) above for measurements and observations.  Electronically signed by Levora DredgeGregory Schnier MD on 01/29/2019 at 5:36:33 PM.   Final      Assessment/Plan 1. Atherosclerosis of native artery of right lower extremity with rest pain (HCC) Recommend:  The patient has evidence of severe atherosclerotic changes of both lower extremities with rest pain that is associated with preulcerative changes and impending tissue loss of the right foot.  This represents a limb threatening ischemia and places the patient at the risk for right limb loss.  Patient should undergo angiography of the right lower extremity, initially then likely the left, with the hope for intervention for limb salvage.  The risks and benefits as well as the alternative therapies was discussed in detail with the patient.  All questions were answered.  Patient agrees to proceed with right leg angiography.  The patient will follow up with me in the office after the procedure.     A total of 70 minutes was spent with this patient and greater than 50% was spent in counseling and coordination of care with the patient.  Discussion included the treatment options for vascular disease including indications for surgery and intervention.  Also discussed is the appropriate timing of treatment.  In addition medical therapy was discussed.   2. Stenosis of carotid artery, unspecified laterality Recommend:  Given the patient's asymptomatic subcritical stenosis no further invasive testing or surgery at this time.  Duplex ultrasound will be obtained with a follow up visit.  Continue antiplatelet therapy as prescribed Continue management of CAD, HTN and Hyperlipidemia Healthy heart diet,  encouraged exercise at least 4 times per week  3. Coronary artery disease of native artery of native heart with stable  angina pectoris (HCC) Continue cardiac and antihypertensive medications as already ordered and reviewed, no changes at this time.  Continue statin as ordered and reviewed, no changes at this time  Nitrates PRN for chest pain   4. Essential hypertension, benign Continue antihypertensive medications as already ordered, these medications have been reviewed and there are no changes at this time.   5. Hyperlipidemia, unspecified hyperlipidemia type Continue statin as ordered and reviewed, no changes at this time   Levora DredgeGregory Schnier, MD  01/29/2019 9:12 PM

## 2019-01-30 ENCOUNTER — Telehealth (INDEPENDENT_AMBULATORY_CARE_PROVIDER_SITE_OTHER): Payer: Self-pay

## 2019-01-30 NOTE — Telephone Encounter (Signed)
Spoke with the patient and she is now scheduled with Dr. Delana Meyer for right leg angio on 02/17/2019 with a 6:45 am arrival time to the [facility]. Patient will do her Covid testing on 02/13/2019 between 12:30-2:30 pm at the [place]. Pre-procedure instruction were discussed and will be mailed to the patient. Patient requested to be rescheduled to an earlier time if someone cancels, I let her know I will keep her in mind if I have a cancellation.

## 2019-02-01 DIAGNOSIS — I70229 Atherosclerosis of native arteries of extremities with rest pain, unspecified extremity: Secondary | ICD-10-CM | POA: Insufficient documentation

## 2019-02-05 ENCOUNTER — Other Ambulatory Visit: Payer: Self-pay | Admitting: *Deleted

## 2019-02-05 MED ORDER — LOSARTAN POTASSIUM-HCTZ 100-25 MG PO TABS: 1.0000 | ORAL_TABLET | Freq: Every day | ORAL | 0 refills | Status: DC

## 2019-02-13 ENCOUNTER — Other Ambulatory Visit: Payer: Self-pay

## 2019-02-13 ENCOUNTER — Other Ambulatory Visit
Admission: RE | Admit: 2019-02-13 | Discharge: 2019-02-13 | Disposition: A | Discharge status: Home / Self Care | Payer: Medicare Other | Source: Ambulatory Visit | Attending: Vascular Surgery | Admitting: Vascular Surgery

## 2019-02-13 DIAGNOSIS — Z20828 Contact with and (suspected) exposure to other viral communicable diseases: Secondary | ICD-10-CM | POA: Diagnosis not present

## 2019-02-13 DIAGNOSIS — Z01812 Encounter for preprocedural laboratory examination: Secondary | ICD-10-CM | POA: Insufficient documentation

## 2019-02-13 LAB — SARS CORONAVIRUS 2 (TAT 6-24 HRS): SARS Coronavirus 2: NEGATIVE

## 2019-02-16 ENCOUNTER — Other Ambulatory Visit (INDEPENDENT_AMBULATORY_CARE_PROVIDER_SITE_OTHER): Payer: Self-pay | Admitting: Nurse Practitioner

## 2019-02-17 ENCOUNTER — Other Ambulatory Visit (INDEPENDENT_AMBULATORY_CARE_PROVIDER_SITE_OTHER): Payer: Self-pay | Admitting: Vascular Surgery

## 2019-02-17 ENCOUNTER — Other Ambulatory Visit: Payer: Self-pay

## 2019-02-17 ENCOUNTER — Encounter: Payer: Self-pay | Admitting: Anesthesiology

## 2019-02-17 ENCOUNTER — Ambulatory Visit
Admission: RE | Admit: 2019-02-17 | Discharge: 2019-02-17 | Disposition: A | Discharge status: Home / Self Care | Payer: Medicare Other | Attending: Vascular Surgery | Admitting: Vascular Surgery

## 2019-02-17 ENCOUNTER — Encounter
Admission: RE | Disposition: A | Discharge status: Home / Self Care | Payer: Self-pay | Source: Home / Self Care | Attending: Vascular Surgery

## 2019-02-17 DIAGNOSIS — N182 Chronic kidney disease, stage 2 (mild): Secondary | ICD-10-CM | POA: Diagnosis not present

## 2019-02-17 DIAGNOSIS — Z79899 Other long term (current) drug therapy: Secondary | ICD-10-CM | POA: Insufficient documentation

## 2019-02-17 DIAGNOSIS — I739 Peripheral vascular disease, unspecified: Secondary | ICD-10-CM

## 2019-02-17 DIAGNOSIS — I25118 Atherosclerotic heart disease of native coronary artery with other forms of angina pectoris: Secondary | ICD-10-CM | POA: Insufficient documentation

## 2019-02-17 DIAGNOSIS — I70221 Atherosclerosis of native arteries of extremities with rest pain, right leg: Secondary | ICD-10-CM | POA: Diagnosis not present

## 2019-02-17 DIAGNOSIS — E785 Hyperlipidemia, unspecified: Secondary | ICD-10-CM | POA: Insufficient documentation

## 2019-02-17 DIAGNOSIS — I129 Hypertensive chronic kidney disease with stage 1 through stage 4 chronic kidney disease, or unspecified chronic kidney disease: Secondary | ICD-10-CM | POA: Diagnosis not present

## 2019-02-17 DIAGNOSIS — I6529 Occlusion and stenosis of unspecified carotid artery: Secondary | ICD-10-CM | POA: Diagnosis not present

## 2019-02-17 HISTORY — PX: LOWER EXTREMITY ANGIOGRAPHY: CATH118251

## 2019-02-17 LAB — CREATININE, SERUM
Creatinine, Ser: 1.64 mg/dL — ABNORMAL HIGH (ref 0.44–1.00)
GFR calc Af Amer: 34 mL/min — ABNORMAL LOW (ref >60)
GFR calc non Af Amer: 30 mL/min — ABNORMAL LOW (ref >60)

## 2019-02-17 LAB — BUN: BUN: 33 mg/dL — ABNORMAL HIGH (ref 8–23)

## 2019-02-17 SURGERY — LOWER EXTREMITY ANGIOGRAPHY
Anesthesia: Moderate Sedation | Laterality: Right

## 2019-02-17 MED ORDER — HYDROMORPHONE HCL 1 MG/ML IJ SOLN: 1.0000 mg | Freq: Once | INTRAMUSCULAR | Status: DC | PRN

## 2019-02-17 MED ORDER — MIDAZOLAM HCL 5 MG/5ML IJ SOLN
INTRAMUSCULAR | Status: AC
  Filled 2019-02-17: qty 5

## 2019-02-17 MED ORDER — ONDANSETRON HCL 4 MG/2ML IJ SOLN: 4.0000 mg | Freq: Four times a day (QID) | INTRAMUSCULAR | Status: DC | PRN

## 2019-02-17 MED ORDER — SODIUM CHLORIDE 0.9 % IV SOLN: 250.0000 mL | INTRAVENOUS | Status: DC | PRN

## 2019-02-17 MED ORDER — ATORVASTATIN CALCIUM 10 MG PO TABS: 10.0000 mg | ORAL_TABLET | Freq: Every day | ORAL | 1 refills | Status: DC

## 2019-02-17 MED ORDER — FENTANYL CITRATE (PF) 100 MCG/2ML IJ SOLN
INTRAMUSCULAR | Status: AC
  Filled 2019-02-17: qty 2

## 2019-02-17 MED ORDER — DIPHENHYDRAMINE HCL 50 MG/ML IJ SOLN: 50.0000 mg | Freq: Once | INTRAMUSCULAR | Status: DC | PRN

## 2019-02-17 MED ORDER — MIDAZOLAM HCL 2 MG/2ML IJ SOLN
INTRAMUSCULAR | Status: DC | PRN
  Administered 2019-02-17: 0.5 mg via INTRAVENOUS
  Administered 2019-02-17: 2 mg via INTRAVENOUS
  Administered 2019-02-17: 1 mg via INTRAVENOUS
  Administered 2019-02-17: 0.5 mg via INTRAVENOUS

## 2019-02-17 MED ORDER — ASPIRIN EC 81 MG PO TBEC: 81.0000 mg | DELAYED_RELEASE_TABLET | Freq: Every day | ORAL | 2 refills | Status: DC

## 2019-02-17 MED ORDER — FENTANYL CITRATE (PF) 100 MCG/2ML IJ SOLN
INTRAMUSCULAR | Status: DC | PRN
  Administered 2019-02-17 (×2): 50 ug via INTRAVENOUS
  Administered 2019-02-17 (×2): 25 ug via INTRAVENOUS

## 2019-02-17 MED ORDER — LABETALOL HCL 5 MG/ML IV SOLN: 10.0000 mg | INTRAVENOUS | Status: DC | PRN

## 2019-02-17 MED ORDER — ASPIRIN 325 MG PO TABS
325.0000 mg | ORAL_TABLET | ORAL | Status: DC
  Filled 2019-02-17: qty 1

## 2019-02-17 MED ORDER — SODIUM CHLORIDE 0.9 % IV SOLN
INTRAVENOUS | Status: DC
  Administered 2019-02-17: 08:00:00 via INTRAVENOUS

## 2019-02-17 MED ORDER — CLOPIDOGREL BISULFATE 75 MG PO TABS
ORAL_TABLET | ORAL | Status: AC
  Administered 2019-02-17: 300 mg via ORAL
  Filled 2019-02-17: qty 4

## 2019-02-17 MED ORDER — CLOPIDOGREL BISULFATE 75 MG PO TABS: 75.0000 mg | ORAL_TABLET | Freq: Every day | ORAL | 4 refills | Status: DC

## 2019-02-17 MED ORDER — HYDRALAZINE HCL 20 MG/ML IJ SOLN: 5.0000 mg | INTRAMUSCULAR | Status: DC | PRN

## 2019-02-17 MED ORDER — HEPARIN SODIUM (PORCINE) 1000 UNIT/ML IJ SOLN
INTRAMUSCULAR | Status: DC | PRN
  Administered 2019-02-17: 4000 [IU] via INTRAVENOUS

## 2019-02-17 MED ORDER — ASPIRIN EC 81 MG PO TBEC
DELAYED_RELEASE_TABLET | ORAL | Status: AC
  Administered 2019-02-17: 81 mg
  Filled 2019-02-17: qty 1

## 2019-02-17 MED ORDER — CLOPIDOGREL BISULFATE 300 MG PO TABS
300.0000 mg | ORAL_TABLET | ORAL | Status: AC
  Administered 2019-02-17: 11:00:00 300 mg via ORAL

## 2019-02-17 MED ORDER — CEFAZOLIN SODIUM-DEXTROSE 2-4 GM/100ML-% IV SOLN
INTRAVENOUS | Status: AC
  Filled 2019-02-17: qty 100

## 2019-02-17 MED ORDER — ACETAMINOPHEN 325 MG PO TABS: 650.0000 mg | ORAL_TABLET | ORAL | Status: DC | PRN

## 2019-02-17 MED ORDER — MORPHINE SULFATE (PF) 4 MG/ML IV SOLN: 2.0000 mg | INTRAVENOUS | Status: DC | PRN

## 2019-02-17 MED ORDER — PROPOFOL 10 MG/ML IV BOLUS
INTRAVENOUS | Status: AC
  Filled 2019-02-17: qty 20

## 2019-02-17 MED ORDER — HEPARIN SODIUM (PORCINE) 1000 UNIT/ML IJ SOLN
INTRAMUSCULAR | Status: AC
  Filled 2019-02-17: qty 1

## 2019-02-17 MED ORDER — MIDAZOLAM HCL 2 MG/ML PO SYRP: 8.0000 mg | ORAL_SOLUTION | Freq: Once | ORAL | Status: DC | PRN

## 2019-02-17 MED ORDER — SODIUM CHLORIDE 0.9% FLUSH: 3.0000 mL | Freq: Two times a day (BID) | INTRAVENOUS | Status: DC

## 2019-02-17 MED ORDER — IODIXANOL 320 MG/ML IV SOLN
INTRAVENOUS | Status: DC | PRN
  Administered 2019-02-17: 120 mL

## 2019-02-17 MED ORDER — CEFAZOLIN SODIUM-DEXTROSE 2-4 GM/100ML-% IV SOLN
2.0000 g | Freq: Once | INTRAVENOUS | Status: AC
  Administered 2019-02-17: 2 g via INTRAVENOUS

## 2019-02-17 MED ORDER — OXYCODONE HCL 5 MG PO TABS: 5.0000 mg | ORAL_TABLET | ORAL | Status: DC | PRN

## 2019-02-17 MED ORDER — METHYLPREDNISOLONE SODIUM SUCC 125 MG IJ SOLR: 125.0000 mg | Freq: Once | INTRAMUSCULAR | Status: DC | PRN

## 2019-02-17 MED ORDER — SODIUM CHLORIDE 0.9 % IV SOLN: INTRAVENOUS | Status: DC

## 2019-02-17 MED ORDER — SODIUM CHLORIDE 0.9% FLUSH: 3.0000 mL | INTRAVENOUS | Status: DC | PRN

## 2019-02-17 MED ORDER — ASPIRIN EC 81 MG PO TBEC
DELAYED_RELEASE_TABLET | ORAL | Status: AC
  Administered 2019-02-17: 243 mg
  Filled 2019-02-17: qty 3

## 2019-02-17 MED ORDER — FAMOTIDINE 20 MG PO TABS: 40.0000 mg | ORAL_TABLET | Freq: Once | ORAL | Status: DC | PRN

## 2019-02-17 MED ORDER — MIDAZOLAM HCL 2 MG/2ML IJ SOLN
INTRAMUSCULAR | Status: AC
  Filled 2019-02-17: qty 2

## 2019-02-17 SURGICAL SUPPLY — 20 items
BALLN LUTONIX DCB 4X60X130 (BALLOONS) ×2
BALLN LUTONIX DCB 5X80X130 (BALLOONS) ×2
BALLOON LUTONIX DCB 4X60X130 (BALLOONS) ×1 IMPLANT
BALLOON LUTONIX DCB 5X80X130 (BALLOONS) ×1 IMPLANT
CATH BEACON 5 .038 100 VERT TP (CATHETERS) ×2 IMPLANT
CATH PIG 70CM (CATHETERS) ×2 IMPLANT
DEVICE PRESTO INFLATION (MISCELLANEOUS) ×2 IMPLANT
DEVICE STARCLOSE SE CLOSURE (Vascular Products) ×2 IMPLANT
DEVICE TORQUE .025-.038 (MISCELLANEOUS) ×2 IMPLANT
GLIDEWIRE ANGLED SS 035X260CM (WIRE) ×2 IMPLANT
NEEDLE ENTRY 21GA 7CM ECHOTIP (NEEDLE) ×2 IMPLANT
PACK ANGIOGRAPHY (CUSTOM PROCEDURE TRAY) ×2 IMPLANT
SET INTRO CAPELLA COAXIAL (SET/KITS/TRAYS/PACK) ×2 IMPLANT
SHEATH BRITE TIP 5FRX11 (SHEATH) ×2 IMPLANT
SHEATH RAABE 6FR (SHEATH) ×2 IMPLANT
STENT LIFESTENT 5F 6X80X135 (Permanent Stent) ×2 IMPLANT
SYR MEDRAD MARK 7 150ML (SYRINGE) ×2 IMPLANT
TUBING CONTRAST HIGH PRESS 72 (TUBING) ×2 IMPLANT
WIRE J 3MM .035X145CM (WIRE) ×2 IMPLANT
WIRE MAGIC TORQUE 260C (WIRE) ×2 IMPLANT

## 2019-02-17 NOTE — Discharge Instructions (Signed)
Aspirin, ASA oral tablets What is this medicine? ASPIRIN (AS pir in) is a pain reliever. It is used to treat mild pain and fever. This medicine is also used as directed by a doctor to prevent and to treat heart attacks, to prevent strokes and blood clots, and to treat arthritis or inflammation. This medicine may be used for other purposes; ask your health care provider or pharmacist if you have questions. COMMON BRAND NAME(S): Aspir-Low, Aspir-Trin, Aspirtab, Bayer Advanced Aspirin, Bayer Aspirin, Bayer Aspirin Extra Strength, Bayer Aspirin Plus, Nature conservation officer, Nature conservation officer Plus, Bayer Genuine Aspirin, Bayer Womens Aspirin, Bufferin, Bufferin Extra Strength, Bufferin Low Dose What should I tell my health care provider before I take this medicine? They need to know if you have any of these conditions:  anemia  asthma  bleeding problems  child with chickenpox, the flu, or other viral infection  diabetes  gout  if you frequently drink alcohol containing drinks  kidney disease  liver disease  low level of vitamin K  lupus  smoke tobacco  stomach ulcers or other problems  an unusual or allergic reaction to aspirin, tartrazine dye, other medicines, dyes, or preservatives  pregnant or trying to get pregnant  breast-feeding How should I use this medicine? Take this medicine by mouth with a glass of water. Follow the directions on the package or prescription label. You can take this medicine with or without food. If it upsets your stomach, take it with food. Do not take your medicine more often than directed. Talk to your pediatrician regarding the use of this medicine in children. While this drug may be prescribed for children as young as 90 years of age for selected conditions, precautions do apply. Children and teenagers should not use this medicine to treat chicken pox or flu symptoms unless directed by a doctor. Patients over 28 years old may have a stronger  reaction and need a smaller dose. Overdosage: If you think you have taken too much of this medicine contact a poison control center or emergency room at once. NOTE: This medicine is only for you. Do not share this medicine with others. What if I miss a dose? If you are taking this medicine on a regular schedule and miss a dose, take it as soon as you can. If it is almost time for your next dose, take only that dose. Do not take double or extra doses. What may interact with this medicine? Do not take this medicine with any of the following medications:  cidofovir  ketorolac  probenecid This medicine may also interact with the following medications:  alcohol  alendronate  bismuth subsalicylate  flavocoxid  herbal supplements like feverfew, garlic, ginger, ginkgo biloba, horse chestnut  medicines for diabetes or glaucoma like acetazolamide, methazolamide  medicines for gout  medicines that treat or prevent blood clots like enoxaparin, heparin, ticlopidine, warfarin  other aspirin and aspirin-like medicines  NSAIDs, medicines for pain and inflammation, like ibuprofen or naproxen  pemetrexed  sulfinpyrazone  varicella live vaccine This list may not describe all possible interactions. Give your health care provider a list of all the medicines, herbs, non-prescription drugs, or dietary supplements you use. Also tell them if you smoke, drink alcohol, or use illegal drugs. Some items may interact with your medicine. What should I watch for while using this medicine? If you are treating yourself for pain, tell your doctor or health care professional if the pain lasts more than 10 days, if it gets worse, or  if there is a new or different kind of pain. Tell your doctor if you see redness or swelling. Also, check with your doctor if you have a fever that lasts for more than 3 days. Only take this medicine to prevent heart attacks or blood clotting if prescribed by your doctor or health  care professional. Do not take aspirin or aspirin-like medicines with this medicine. Too much aspirin can be dangerous. Always read the labels carefully. This medicine can irritate your stomach or cause bleeding problems. Do not smoke cigarettes or drink alcohol while taking this medicine. Do not lie down for 30 minutes after taking this medicine to prevent irritation to your throat. If you are scheduled for any medical or dental procedure, tell your healthcare provider that you are taking this medicine. You may need to stop taking this medicine before the procedure. This medicine may be used to treat migraines. If you take migraine medicines for 10 or more days a month, your migraines may get worse. Keep a diary of headache days and medicine use. Contact your healthcare professional if your migraine attacks occur more frequently. What side effects may I notice from receiving this medicine? Side effects that you should report to your doctor or health care professional as soon as possible:  allergic reactions like skin rash, itching or hives, swelling of the face, lips, or tongue  breathing problems  changes in hearing, ringing in the ears  confusion  general ill feeling or flu-like symptoms  pain on swallowing  redness, blistering, peeling or loosening of the skin, including inside the mouth or nose  signs and symptoms of bleeding such as bloody or black, tarry stools; red or dark-brown urine; spitting up blood or brown material that looks like coffee grounds; red spots on the skin; unusual bruising or bleeding from the eye, gums, or nose  trouble passing urine or change in the amount of urine  unusually weak or tired  yellowing of the eyes or skin Side effects that usually do not require medical attention (report to your doctor or health care professional if they continue or are bothersome):  diarrhea or constipation  headache  nausea, vomiting  stomach gas, heartburn This list  may not describe all possible side effects. Call your doctor for medical advice about side effects. You may report side effects to FDA at 1-800-FDA-1088. Where should I keep my medicine? Keep out of the reach of children. Store at room temperature between 15 and 30 degrees C (59 and 86 degrees F). Protect from heat and moisture. Do not use this medicine if it has a strong vinegar smell. Throw away any unused medicine after the expiration date. NOTE: This sheet is a summary. It may not cover all possible information. If you have questions about this medicine, talk to your doctor, pharmacist, or health care provider.  2020 Elsevier/Gold Standard (2016-04-24 10:42:13)    Atorvastatin tablets What is this medicine? ATORVASTATIN (a TORE va sta tin) is known as a HMG-CoA reductase inhibitor or 'statin'. It lowers the level of cholesterol and triglycerides in the blood. This drug may also reduce the risk of heart attack, stroke, or other health problems in patients with risk factors for heart disease. Diet and lifestyle changes are often used with this drug. This medicine may be used for other purposes; ask your health care provider or pharmacist if you have questions. COMMON BRAND NAME(S): Lipitor What should I tell my health care provider before I take this medicine? They need to  know if you have any of these conditions:  diabetes  if you often drink alcohol  history of stroke  kidney disease  liver disease  muscle aches or weakness  thyroid disease  an unusual or allergic reaction to atorvastatin, other medicines, foods, dyes, or preservatives  pregnant or trying to get pregnant  breast-feeding How should I use this medicine? Take this medicine by mouth with a glass of water. Follow the directions on the prescription label. You can take it with or without food. If it upsets your stomach, take it with food. Do not take with grapefruit juice. Take your medicine at regular intervals.  Do not take it more often than directed. Do not stop taking except on your doctor's advice. Talk to your pediatrician regarding the use of this medicine in children. While this drug may be prescribed for children as young as 10 for selected conditions, precautions do apply. Overdosage: If you think you have taken too much of this medicine contact a poison control center or emergency room at once. NOTE: This medicine is only for you. Do not share this medicine with others. What if I miss a dose? If you miss a dose, take it as soon as you can. If your next dose is to be taken in less than 12 hours, then do not take the missed dose. Take the next dose at your regular time. Do not take double or extra doses. What may interact with this medicine? Do not take this medicine with any of the following medications:  dasabuvir; ombitasvir; paritaprevir; ritonavir  ombitasvir; paritaprevir; ritonavir  posaconazole  red yeast rice This medicine may also interact with the following medications:  alcohol  birth control pills  certain antibiotics like erythromycin and clarithromycin  certain antivirals for HIV or hepatitis  certain medicines for cholesterol like fenofibrate, gemfibrozil, and niacin  certain medicines for fungal infections like ketoconazole and itraconazole  colchicine  cyclosporine  digoxin  grapefruit juice  rifampin This list may not describe all possible interactions. Give your health care provider a list of all the medicines, herbs, non-prescription drugs, or dietary supplements you use. Also tell them if you smoke, drink alcohol, or use illegal drugs. Some items may interact with your medicine. What should I watch for while using this medicine? Visit your doctor or health care professional for regular check-ups. You may need regular tests to make sure your liver is working properly. Your health care professional may tell you to stop taking this medicine if you develop  muscle problems. If your muscle problems do not go away after stopping this medicine, contact your health care professional. Do not become pregnant while taking this medicine. Women should inform their health care professional if they wish to become pregnant or think they might be pregnant. There is a potential for serious side effects to an unborn child. Talk to your health care professional or pharmacist for more information. Do not breast-feed an infant while taking this medicine. This medicine may increase blood sugar. Ask your healthcare provider if changes in diet or medicines are needed if you have diabetes. If you are going to need surgery or other procedure, tell your doctor that you are using this medicine. This drug is only part of a total heart-health program. Your doctor or a dietician can suggest a low-cholesterol and low-fat diet to help. Avoid alcohol and smoking, and keep a proper exercise schedule. This medicine may cause a decrease in Co-Enzyme Q-10. You should make sure that you  get enough Co-Enzyme Q-10 while you are taking this medicine. Discuss the foods you eat and the vitamins you take with your health care professional. What side effects may I notice from receiving this medicine? Side effects that you should report to your doctor or health care professional as soon as possible:  allergic reactions like skin rash, itching or hives, swelling of the face, lips, or tongue  fever  joint pain  loss of memory  redness, blistering, peeling or loosening of the skin, including inside the mouth  signs and symptoms of high blood sugar such as being more thirsty or hungry or having to urinate more than normal. You may also feel very tired or have blurry vision.  signs and symptoms of liver injury like dark yellow or brown urine; general ill feeling or flu-like symptoms; light-belly pain; unusually weak or tired; yellowing of the eyes or skin  signs and symptoms of muscle injury  like dark urine; trouble passing urine or change in the amount of urine; unusually weak or tired; muscle pain or side or back pain Side effects that usually do not require medical attention (report to your doctor or health care professional if they continue or are bothersome):  diarrhea  nausea  stomach pain  trouble sleeping  upset stomach This list may not describe all possible side effects. Call your doctor for medical advice about side effects. You may report side effects to FDA at 1-800-FDA-1088. Where should I keep my medicine? Keep out of the reach of children. Store between 20 and 25 degrees C (68 and 77 degrees F). Throw away any unused medicine after the expiration date. NOTE: This sheet is a summary. It may not cover all possible information. If you have questions about this medicine, talk to your doctor, pharmacist, or health care provider.  2020 Elsevier/Gold Standard (2018-01-01 11:36:16)    Clopidogrel tablets What is this medicine? CLOPIDOGREL (kloh PID oh grel) helps to prevent blood clots. This medicine is used to prevent heart attack, stroke, or other vascular events in people who are at high risk. This medicine may be used for other purposes; ask your health care provider or pharmacist if you have questions. COMMON BRAND NAME(S): Plavix What should I tell my health care provider before I take this medicine? They need to know if you have any of the following conditions:  bleeding disorders  bleeding in the brain  having surgery  history of stomach bleeding  an unusual or allergic reaction to clopidogrel, other medicines, foods, dyes, or preservatives  pregnant or trying to get pregnant  breast-feeding How should I use this medicine? Take this medicine by mouth with a glass of water. Follow the directions on the prescription label. You may take this medicine with or without food. If it upsets your stomach, take it with food. Take your medicine at regular  intervals. Do not take it more often than directed. Do not stop taking except on your doctor's advice. A special MedGuide will be given to you by the pharmacist with each prescription and refill. Be sure to read this information carefully each time. Talk to your pediatrician regarding the use of this medicine in children. Special care may be needed. Overdosage: If you think you have taken too much of this medicine contact a poison control center or emergency room at once. NOTE: This medicine is only for you. Do not share this medicine with others. What if I miss a dose? If you miss a dose, take it as  soon as you can. If it is almost time for your next dose, take only that dose. Do not take double or extra doses. What may interact with this medicine? Do not take this medicine with the following medications:  dasabuvir; ombitasvir; paritaprevir; ritonavir  defibrotide  selexipag This medicine may also interact with the following medications:  certain medicines that treat or prevent blood clots like warfarin  narcotic medicines for pain  NSAIDs, medicines for pain and inflammation, like ibuprofen or naproxen  repaglinide  SNRIs, medicines for depression, like desvenlafaxine, duloxetine, levomilnacipran, venlafaxine  SSRIs, medicines for depression, like citalopram, escitalopram, fluoxetine, fluvoxamine, paroxetine, sertraline  stomach acid blockers like cimetidine, esomeprazole, omeprazole This list may not describe all possible interactions. Give your health care provider a list of all the medicines, herbs, non-prescription drugs, or dietary supplements you use. Also tell them if you smoke, drink alcohol, or use illegal drugs. Some items may interact with your medicine. What should I watch for while using this medicine? Visit your doctor or health care professional for regular check-ups. Do not stop taking your medicine unless your doctor tells you to. Notify your doctor or health care  professional and seek emergency treatment if you develop breathing problems; changes in vision; chest pain; severe, sudden headache; pain, swelling, warmth in the leg; trouble speaking; sudden numbness or weakness of the face, arm or leg. These can be signs that your condition has gotten worse. If you are going to have surgery or dental work, tell your doctor or health care professional that you are taking this medicine. Certain genetic factors may reduce the effect of this medicine. Your doctor may use genetic tests to determine treatment. Only take aspirin if you are instructed to. Low doses of aspirin are used with this medicine to treat some conditions. Taking aspirin with this medicine can increase your risk of bleeding so you must be careful. Talk to your doctor or pharmacist if you have questions. What side effects may I notice from receiving this medicine? Side effects that you should report to your doctor or health care professional as soon as possible:  allergic reactions like skin rash, itching or hives, swelling of the face, lips, or tongue  signs and symptoms of bleeding such as bloody or black, tarry stools; red or dark-brown urine; spitting up blood or brown material that looks like coffee grounds; red spots on the skin; unusual bruising or bleeding from the eye, gums, or nose  signs and symptoms of a blood clot such as breathing problems; changes in vision; chest pain; severe, sudden headache; pain, swelling, warmth in the leg; trouble speaking; sudden numbness or weakness of the face, arm or leg  signs and symptoms of low blood sugar such as feeling anxious; confusion; dizziness; increased hunger; unusually weak or tired; increased sweating; shakiness; cold, clammy skin; irritable; headache; blurred vision; fast heartbeat; loss of consciousness Side effects that usually do not require medical attention (report to your doctor or health care professional if they continue or are  bothersome):  constipation  diarrhea  headache  upset stomach This list may not describe all possible side effects. Call your doctor for medical advice about side effects. You may report side effects to FDA at 1-800-FDA-1088. Where should I keep my medicine? Keep out of the reach of children. Store at room temperature of 59 to 86 degrees F (15 to 30 degrees C). Throw away any unused medicine after the expiration date. NOTE: This sheet is a summary. It may not  cover all possible information. If you have questions about this medicine, talk to your doctor, pharmacist, or health care provider.  2020 Elsevier/Gold Standard (2017-08-12 15:03:38)   Angiogram, Care After This sheet gives you information about how to care for yourself after your procedure. Your doctor may also give you more specific instructions. If you have problems or questions, contact your doctor. Follow these instructions at home: Insertion site care  Follow instructions from your doctor about how to take care of your long, thin tube (catheter) insertion area. Make sure you: ? Wash your hands with soap and water before you change your bandage (dressing). If you cannot use soap and water, use hand sanitizer. ? Change your bandage as told by your doctor. ? Leave stitches (sutures), skin glue, or skin tape (adhesive) strips in place. They may need to stay in place for 2 weeks or longer. If tape strips get loose and curl up, you may trim the loose edges. Do not remove tape strips completely unless your doctor says it is okay.  Do not take baths, swim, or use a hot tub until your doctor says it is okay.  You may shower 24-48 hours after the procedure or as told by your doctor. ? Gently wash the area with plain soap and water. ? Pat the area dry with a clean towel. ? Do not rub the area. This may cause bleeding.  Do not apply powder or lotion to the area. Keep the area clean and dry.  Check your insertion area every day  for signs of infection. Check for: ? More redness, swelling, or pain. ? Fluid or blood. ? Warmth. ? Pus or a bad smell. Activity  Rest as told by your doctor, usually for 1-2 days.  Do not lift anything that is heavier than 10 lbs. (4.5 kg) or as told by your doctor.  Do not drive for 24 hours if you were given a medicine to help you relax (sedative).  Do not drive or use heavy machinery while taking prescription pain medicine. General instructions   Go back to your normal activities as told by your doctor, usually in about a week. Ask your doctor what activities are safe for you.  If the insertion area starts to bleed, lie flat and put pressure on the area. If the bleeding does not stop, get help right away. This is an emergency.  Drink enough fluid to keep your pee (urine) clear or pale yellow.  Take over-the-counter and prescription medicines only as told by your doctor.  Keep all follow-up visits as told by your doctor. This is important. Contact a doctor if:  You have a fever.  You have chills.  You have more redness, swelling, or pain around your insertion area.  You have fluid or blood coming from your insertion area.  The insertion area feels warm to the touch.  You have pus or a bad smell coming from your insertion area.  You have more bruising around the insertion area.  Blood collects in the tissue around the insertion area (hematoma) that may be painful to the touch. Get help right away if:  You have a lot of pain in the insertion area.  The insertion area swells very fast.  The insertion area is bleeding, and the bleeding does not stop after holding steady pressure on the area.  The area near or just beyond the insertion area becomes pale, cool, tingly, or numb. These symptoms may be an emergency. Do not wait to  see if the symptoms will go away. Get medical help right away. Call your local emergency services (911 in the [place] Do not drive yourself to  the hospital. Summary  After the procedure, it is common to have bruising and tenderness at the long, thin tube insertion area.  After the procedure, it is important to rest and drink plenty of fluids.  Do not take baths, swim, or use a hot tub until your doctor says it is okay to do so. You may shower 24-48 hours after the procedure or as told by your doctor.  If the insertion area starts to bleed, lie flat and put pressure on the area. If the bleeding does not stop, get help right away. This is an emergency. This information is not intended to replace advice given to you by your health care provider. Make sure you discuss any questions you have with your health care provider. Document Released: 06/08/2008 Document Revised: 02/22/2017 Document Reviewed: 03/06/2016 Elsevier Patient Education  2020 [employer]    Femoral Site Care This sheet gives you information about how to care for yourself after your procedure. Your health care provider may also give you more specific instructions. If you have problems or questions, contact your health care provider. What can I expect after the procedure? After the procedure, it is common to have:  Bruising that usually fades within 1-2 weeks.  Tenderness at the site. Follow these instructions at home: Wound care  Follow instructions from your health care provider about how to take care of your insertion site. Make sure you: ? Wash your hands with soap and water before you change your bandage (dressing). If soap and water are not available, use hand sanitizer. ? Change your dressing as told by your health care provider. ? Leave stitches (sutures), skin glue, or adhesive strips in place. These skin closures may need to stay in place for 2 weeks or longer. If adhesive strip edges start to loosen and curl up, you may trim the loose edges. Do not remove adhesive strips completely unless your health care provider tells you to do that.  Do not take  baths, swim, or use a hot tub until your health care provider approves.  You may shower 24-48 hours after the procedure or as told by your health care provider. ? Gently wash the site with plain soap and water. ? Pat the area dry with a clean towel. ? Do not rub the site. This may cause bleeding.  Do not apply powder or lotion to the site. Keep the site clean and dry.  Check your femoral site every day for signs of infection. Check for: ? Redness, swelling, or pain. ? Fluid or blood. ? Warmth. ? Pus or a bad smell. Activity  For the first 2-3 days after your procedure, or as long as directed: ? Avoid climbing stairs as much as possible. ? Do not squat.  Do not lift anything that is heavier than 10 lb (4.5 kg), or the limit that you are told, until your health care provider says that it is safe.  Rest as directed. ? Avoid sitting for a long time without moving. Get up to take short walks every 1-2 hours.  Do not drive for 24 hours if you were given a medicine to help you relax (sedative). General instructions  Take over-the-counter and prescription medicines only as told by your health care provider.  Keep all follow-up visits as told by your health care provider. This is important.  Contact a health care provider if you have:  A fever or chills.  You have redness, swelling, or pain around your insertion site. Get help right away if:  The catheter insertion area swells very fast.  You pass out.  You suddenly start to sweat or your skin gets clammy.  The catheter insertion area is bleeding, and the bleeding does not stop when you hold steady pressure on the area.  The area near or just beyond the catheter insertion site becomes pale, cool, tingly, or numb. These symptoms may represent a serious problem that is an emergency. Do not wait to see if the symptoms will go away. Get medical help right away. Call your local emergency services (911 in the [place] Do not drive  yourself to the hospital. Summary  After the procedure, it is common to have bruising that usually fades within 1-2 weeks.  Check your femoral site every day for signs of infection.  Do not lift anything that is heavier than 10 lb (4.5 kg), or the limit that you are told, until your health care provider says that it is safe. This information is not intended to replace advice given to you by your health care provider. Make sure you discuss any questions you have with your health care provider. Document Released: 11/13/2013 Document Revised: 03/25/2017 Document Reviewed: 03/25/2017 Elsevier Patient Education  2020 [employer] Inc[employer]    Moderate Conscious Sedation, Adult, Care After These instructions provide you with information about caring for yourself after your procedure. Your health care provider may also give you more specific instructions. Your treatment has been planned according to current medical practices, but problems sometimes occur. Call your health care provider if you have any problems or questions after your procedure. What can I expect after the procedure? After your procedure, it is common:  To feel sleepy for several hours.  To feel clumsy and have poor balance for several hours.  To have poor judgment for several hours.  To vomit if you eat too soon. Follow these instructions at home: For at least 24 hours after the procedure:   Do not: ? Participate in activities where you could fall or become injured. ? Drive. ? Use heavy machinery. ? Drink alcohol. ? Take sleeping pills or medicines that cause drowsiness. ? Make important decisions or sign legal documents. ? Take care of children on your own.  Rest. Eating and drinking  Follow the diet recommended by your health care provider.  If you vomit: ? Drink water, juice, or soup when you can drink without vomiting. ? Make sure you have little or no nausea before eating solid foods. General instructions  Have  a responsible adult stay with you until you are awake and alert.  Take over-the-counter and prescription medicines only as told by your health care provider.  If you smoke, do not smoke without supervision.  Keep all follow-up visits as told by your health care provider. This is important. Contact a health care provider if:  You keep feeling nauseous or you keep vomiting.  You feel light-headed.  You develop a rash.  You have a fever. Get help right away if:  You have trouble breathing. This information is not intended to replace advice given to you by your health care provider. Make sure you discuss any questions you have with your health care provider. Document Released: 12/31/2012 Document Revised: 02/22/2017 Document Reviewed: 07/02/2015 Elsevier Patient Education  2020 [employer].

## 2019-02-17 NOTE — H&P (Signed)
[facility] History & Physical Update  The patient was interviewed and re-examined.  The patient's previous History and Physical has been reviewed and is unchanged.  There is no change in the plan of care. We plan to proceed with the scheduled procedure.  Hortencia Pilar, MD  02/17/2019, 7:53 AM

## 2019-02-17 NOTE — Op Note (Signed)
[facility] Percutaneous Study/Intervention Procedural Note   Date of Surgery: 02/17/2019  Surgeon:  Katha Cabal, MD.  Pre-operative Diagnosis: Atherosclerotic occlusive disease bilateral lower extremities with rest pain of the right lower extremity  Post-operative diagnosis: Same  Procedure(s) Performed: 1. Introduction catheter into right lower extremity 3rd order catheter placement  2. Contrast injection right lower extremity for distal runoff   3. Percutaneous transluminal angioplasty and stent placement with a 6 mm x 80 mm life stent right superficial femoral artery  4. Star close closure left common femoral arteriotomy  Anesthesia: Conscious sedation was administered under my direct supervision by the interventional radiology RN. IV Versed plus fentanyl were utilized. Continuous ECG, pulse oximetry and blood pressure was monitored throughout the entire procedure.  Conscious sedation was for a total of 50 minutes.  Sheath: 6 [place] Raby left common femoral retrograde  Contrast: 120 cc  Fluoroscopy Time: 7.7 minutes  Indications: [name] Cooper presents with increasing pain of the right lower extremity.  Noninvasive studies as well as physical examination of shown severe atherosclerotic occlusive disease.  The risks and benefits are reviewed all questions answered patient agrees to proceed angiography and revascularization for limb salvage.  Procedure: [name] Cooper is a 78 y.o. y.o. female who was identified and appropriate procedural time out was performed. The patient was then placed supine on the table and prepped and draped in the usual sterile fashion.   Ultrasound was placed in the sterile sleeve and the left groin was evaluated the left common femoral artery was echolucent and pulsatile indicating patency.  Image was recorded for the permanent record and under real-time visualization  a microneedle was inserted into the common femoral artery microwire followed by a micro-sheath.  A J-wire was then advanced through the micro-sheath and a  5 [place] sheath was then inserted over a J-wire. J-wire was then advanced and a 5 French pigtail catheter was positioned at the level of T12. AP projection of the aorta was then obtained. Pigtail catheter was repositioned to above the bifurcation and a LAO view of the pelvis was obtained.  Subsequently a pigtail catheter with the stiff angle Glidewire was used to cross the aortic bifurcation the catheter wire were advanced down into the right distal external iliac artery. Oblique view of the femoral bifurcation was then obtained and subsequently the wire was reintroduced and the pigtail catheter negotiated into the SFA representing third order catheter placement. Distal runoff was then performed.  Diagnostic interpretation: The abdominal aorta is opacified with a bolus injection contrast.  There are no hemodynamically significant lesions.  The borders are smooth and there is minimal atherosclerotic changes.  The aortic bifurcation bilateral common external and internal iliac arteries are all widely patent as well with minimal atherosclerotic changes noted.  The right common femoral and profunda femoris are widely patent.  Superficial femoral is widely patent at its origin.  In its midportion just above Hunter's canal there is a focal area of extensive disease with tandem areas greater than 80% one focused area that appears greater than 90%.  Below Hunter's canal the popliteal is widely patent throughout its course.  The trifurcation is patent in the anterior tibial is the dominant artery to the foot it fills the dorsalis pedis and pedal arch and is quite nice throughout its course.  The tibioperoneal trunk is patent the peroneal and posterior tibial are small proximally and tapers to occlusion distally.  In the foot there is nonfilling of the medial and  lateral plantar vessels.  Based on this information I elected to move forward with intervention for limb salvage  5000 units of heparin was then given and allowed to circulate and a 6 [place] Raby sheath was advanced up and over the bifurcation and positioned in the femoral artery  KMP  catheter and Magic torque wire were then negotiated down into the distal popliteal.  Hand-injection of contrast was then used to verify intraluminal positioning.  And a magnified image of the lesion was also created through the sheath. A 4 x 60 Lutonix balloon was used to angioplasty the superficial femoral artery. Inflation to 10 atmospheres for 2 minutes. Follow-up imaging demonstrated patency with greater than 50% residual stenosis.  Therefore a 6 mm x 80 mm life stent was deployed across this lesion and then postdilated with a 5 mm x 80 mm Lutonix drug-eluting balloon.  Follow-up imaging demonstrated excellent result with less than 5% residual stenosis distal runoff was then reassessed and found to be unchanged.  After review of these images the sheath is pulled into the left external iliac oblique of the common femoral is obtained and a Star close device deployed. There no immediate complications.   Findings:  The abdominal aorta is opacified with a bolus injection contrast.  There are no hemodynamically significant lesions.  The borders are smooth and there is minimal atherosclerotic changes.  The aortic bifurcation bilateral common external and internal iliac arteries are all widely patent as well with minimal atherosclerotic changes noted.  The right common femoral and profunda femoris are widely patent.  Superficial femoral is widely patent at its origin.  In its midportion just above Hunter's canal there is a focal area of extensive disease with tandem areas greater than 80% one focused area that appears greater than 90%.  Below Hunter's canal the popliteal is widely patent throughout its course.  The  trifurcation is patent in the anterior tibial is the dominant artery to the foot it fills the dorsalis pedis and pedal arch and is quite nice throughout its course.  The tibioperoneal trunk is patent the peroneal and posterior tibial are small proximally and tapers to occlusion distally.  In the foot there is nonfilling of the medial and lateral plantar vessels.  Following angioplasty to 4 mm the SFA is now patent with greater than 50% residual stenosis.  Therefore a life stent is deployed postdilated to 5 mm in follow-up imaging demonstrates less than 5% residual stenosis.  There is now in-line flow and looks quite nice and preservation of distal runoff via the AT    Summary: Successful recanalization right lower extremity for limb salvage    Disposition: Patient was taken to the recovery room in stable condition having tolerated the procedure well.  [name] Cooper, [name] Cooper 02/17/2019,10:57 AM

## 2019-03-03 ENCOUNTER — Other Ambulatory Visit (INDEPENDENT_AMBULATORY_CARE_PROVIDER_SITE_OTHER): Payer: Self-pay | Admitting: Vascular Surgery

## 2019-03-03 DIAGNOSIS — Z95820 Peripheral vascular angioplasty status with implants and grafts: Secondary | ICD-10-CM

## 2019-03-03 DIAGNOSIS — I70221 Atherosclerosis of native arteries of extremities with rest pain, right leg: Secondary | ICD-10-CM

## 2019-03-04 ENCOUNTER — Ambulatory Visit (INDEPENDENT_AMBULATORY_CARE_PROVIDER_SITE_OTHER): Payer: Medicare Other

## 2019-03-04 ENCOUNTER — Other Ambulatory Visit: Payer: Self-pay

## 2019-03-04 ENCOUNTER — Encounter (INDEPENDENT_AMBULATORY_CARE_PROVIDER_SITE_OTHER): Payer: Self-pay | Admitting: Nurse Practitioner

## 2019-03-04 ENCOUNTER — Ambulatory Visit (INDEPENDENT_AMBULATORY_CARE_PROVIDER_SITE_OTHER): Payer: Medicare Other | Admitting: Nurse Practitioner

## 2019-03-04 VITALS — BP 158/73 | HR 69 | Resp 16 | Wt 187.4 lb

## 2019-03-04 DIAGNOSIS — I10 Essential (primary) hypertension: Secondary | ICD-10-CM

## 2019-03-04 DIAGNOSIS — I70221 Atherosclerosis of native arteries of extremities with rest pain, right leg: Secondary | ICD-10-CM

## 2019-03-04 DIAGNOSIS — E785 Hyperlipidemia, unspecified: Secondary | ICD-10-CM | POA: Diagnosis not present

## 2019-03-04 DIAGNOSIS — Z95820 Peripheral vascular angioplasty status with implants and grafts: Secondary | ICD-10-CM | POA: Diagnosis not present

## 2019-03-04 DIAGNOSIS — I70223 Atherosclerosis of native arteries of extremities with rest pain, bilateral legs: Secondary | ICD-10-CM

## 2019-03-08 ENCOUNTER — Encounter (INDEPENDENT_AMBULATORY_CARE_PROVIDER_SITE_OTHER): Payer: Self-pay | Admitting: Nurse Practitioner

## 2019-03-08 NOTE — Progress Notes (Signed)
SUBJECTIVE:  Patient ID: [name] Cooper, female    DOB: 1940/05/18, 78 y.o.   MRN: [id number] Chief Complaint  Patient presents with  . Follow-up    [facility] 2week abi    HPI  [name] Cooper is a 78 y.o. female The patient returns to the office for followup and review status post angiogram with intervention. The patient notes improvement in the right  lower extremity symptoms. No interval shortening of the patient's claudication distance or rest pain symptoms. Previous wounds have now healed.  No new ulcers or wounds have occurred since the last visit.  The patient does continue to have pain in the left lower extremity.  There have been no significant changes to the patient's overall health care.  The patient denies amaurosis fugax or recent TIA symptoms. There are no recent neurological changes noted. The patient denies history of DVT, PE or superficial thrombophlebitis. The patient denies recent episodes of angina or shortness of breath.   ABI's Rt=1.19 and Lt=1.00  (previous ABI's Rt=1.19 and Lt=1.24) Duplex US of the right lower extremity shows biphasic/triphasic tibial artery waveforms with monophasic waveforms in the left anterior tibial artery with biphasic in the left posterior tibial artery.  The patient also has dampened nearly flat left toe waveforms.  Past Medical History:  Diagnosis Date  . Arthritis    left hip, left ankle  . Cholelithiases   . CKD (chronic kidney disease), stage II   . Coronary artery disease    a. cardiac cath 01/2011: mid LAD stent patent, mild lumenal irreg's, nl EF 55%, no AS/MR  . Fibromyalgia   . Frozen shoulder   . History of kidney stones   . HTN (hypertension)    controlled with medication;   . Hyperlipidemia    a. statin intolerant  . Iron deficiency   . Thrombocytopenia (HCC)   . UTI (urinary tract infection)   . Venous stasis     Past Surgical History:  Procedure Laterality Date  . CARDIAC CATHETERIZATION     stents placed in  mid LAD  . CERVICAL SPINE SURGERY    . FETAL BLOOD TRANSFUSION  2014  . Frozen Shoulder    . KIDNEY STONE SURGERY    . LEG SURGERY     broke her leg   . LOWER EXTREMITY ANGIOGRAPHY Right 02/17/2019   Procedure: LOWER EXTREMITY ANGIOGRAPHY;  Surgeon: Renford Dills, MD;  Location: [facility] INVASIVE CV LAB;  Service: Cardiovascular;  Laterality: Right;    Social History   Socioeconomic History  . Marital status: Widowed    Spouse name: Not on file  . Number of children: Not on file  . Years of education: Not on file  . Highest education level: Not on file  Occupational History  . Not on file  Tobacco Use  . Smoking status: Never Smoker  . Smokeless tobacco: Never Used  Substance and Sexual Activity  . Alcohol use: No  . Drug use: No  . Sexual activity: Not on file  Other Topics Concern  . Not on file  Social History Narrative  . Not on file   Social Determinants of Health   Financial Resource Strain:   . Difficulty of Paying Living Expenses: Not on file  Food Insecurity:   . Worried About Programme researcher, broadcasting/film/video in the Last Year: Not on file  . Ran Out of Food in the Last Year: Not on file  Transportation Needs:   . Lack of Transportation (Medical): Not on file  .  Lack of Transportation (Non-Medical): Not on file  Physical Activity:   . Days of Exercise per Week: Not on file  . Minutes of Exercise per Session: Not on file  Stress:   . Feeling of Stress : Not on file  Social Connections:   . Frequency of Communication with Friends and Family: Not on file  . Frequency of Social Gatherings with Friends and Family: Not on file  . Attends Religious Services: Not on file  . Active Member of Clubs or Organizations: Not on file  . Attends Archivist Meetings: Not on file  . Marital Status: Not on file  Intimate Partner Violence:   . Fear of Current or Ex-Partner: Not on file  . Emotionally Abused: Not on file  . Physically Abused: Not on file  . Sexually Abused:  Not on file    Family History  Problem Relation Age of Onset  . Hypertension Mother   . Heart attack Mother   . Heart attack Father   . Heart disease Brother        h/o CABG  . Diabetes Brother   . Kidney disease Brother   . Kidney cancer Neg Hx   . Bladder Cancer Neg Hx     Allergies  Allergen Reactions  . Sulfa Antibiotics Anaphylaxis     Review of Systems   Review of Systems: Negative Unless Checked Constitutional: [] Weight loss  [] Fever  [] Chills Cardiac: [] Chest pain   []  Atrial Fibrillation  [] Palpitations   [] Shortness of breath when laying flat   [] Shortness of breath with exertion. [] Shortness of breath at rest Vascular:  [] Pain in legs with walking   [] Pain in legs with standing [] Pain in legs when laying flat   [x] Claudication    [] Pain in feet when laying flat    [] History of DVT   [] Phlebitis   [] Swelling in legs   [] Varicose veins   [] Non-healing ulcers Pulmonary:   [] Uses home oxygen   [] Productive cough   [] Hemoptysis   [] Wheeze  [] COPD   [] Asthma Neurologic:  [] Dizziness   [] Seizures  [] Blackouts [] History of stroke   [] History of TIA  [] Aphasia   [] Temporary Blindness   [] Weakness or numbness in arm   [] Weakness or numbness in leg Musculoskeletal:   [] Joint swelling   [] Joint pain   [] Low back pain  []  History of Knee Replacement [x] Arthritis [] back Surgeries  []  Spinal Stenosis    Hematologic:  [] Easy bruising  [] Easy bleeding   [] Hypercoagulable state   [x] Anemic Gastrointestinal:  [] Diarrhea   [] Vomiting  [] Gastroesophageal reflux/heartburn   [] Difficulty swallowing. [] Abdominal pain Genitourinary:  [x] Chronic kidney disease   [] Difficult urination  [] Anuric   [] Blood in urine [] Frequent urination  [] Burning with urination   [] Hematuria Skin:  [] Rashes   [] Ulcers [] Wounds Psychological:  [] History of anxiety   []  History of major depression  []  Memory Difficulties      OBJECTIVE:   Physical Exam  BP (!) 158/73 (BP Location: Right Arm)   Pulse 69   Resp  16   Wt 187 lb 6.4 oz (85 kg)   BMI 29.35 kg/m   Gen: WD/WN, NAD Head: [place]/AT, No temporalis wasting.  Ear/Nose/Throat: Hearing grossly intact, nares w/o erythema or drainage Eyes: PER, EOMI, sclera nonicteric.  Neck: Supple, no masses.  No JVD.  Pulmonary:  Good air movement, no use of accessory muscles.  Cardiac: RRR Vascular:  Vessel Right Left  Dorsalis Pedis Palpable Not Palpable  Posterior Tibial Palpable Palpable  Gastrointestinal: soft, non-distended. No guarding/no peritoneal signs.  Musculoskeletal: M/S 5/5 throughout.  No deformity or atrophy.  Neurologic: Pain and light touch intact in extremities.  Symmetrical.  Speech is fluent. Motor exam as listed above. Psychiatric: Judgment intact, Mood & affect appropriate for pt's clinical situation. Dermatologic: No Venous rashes. No Ulcers Noted.  No changes consistent with cellulitis. Lymph : No Cervical lymphadenopathy, no lichenification or skin changes of chronic lymphedema.       ASSESSMENT AND PLAN:  1. Atherosclerosis of native artery of both lower extremities with rest pain (HCC) Recommend:  The patient has experienced increased symptoms and is now describing lifestyle limiting claudication and mild rest pain.   Given the severity of the patient's lower extremity symptoms the patient should undergo angiography and intervention.  Risk and benefits were reviewed the patient.  Indications for the procedure were reviewed.  All questions were answered, the patient agrees to proceed.   The patient should continue walking and begin a more formal exercise program.  The patient should continue antiplatelet therapy and aggressive treatment of the lipid abnormalities  The patient will follow up with me after the angiogram.   2. Essential hypertension, benign Continue antihypertensive medications as already ordered, these medications have been reviewed and there are no changes at this time.   3. Hyperlipidemia,  unspecified hyperlipidemia type Continue statin as ordered and reviewed, no changes at this time    Current Outpatient Medications on File Prior to Visit  Medication Sig Dispense Refill  . Apoaequorin (PREVAGEN PO) Take 1 tablet by mouth daily.    Marland Kitchen. aspirin EC 81 MG tablet Take 1 tablet (81 mg total) by mouth daily. 150 tablet 2  . atorvastatin (LIPITOR) 10 MG tablet TAKE 1 TABLET BY MOUTH DAILY 90 tablet 0  . clopidogrel (PLAVIX) 75 MG tablet Take 1 tablet (75 mg total) by mouth daily. 30 tablet 4  . gabapentin (NEURONTIN) 100 MG capsule Take 100 mg by mouth at bedtime.    Marland Kitchen. losartan-hydrochlorothiazide (HYZAAR) 100-25 MG tablet Take 1 tablet by mouth daily. 90 tablet 0  . temazepam (RESTORIL) 30 MG capsule Take 30 mg by mouth at bedtime.      No current facility-administered medications on file prior to visit.    There are no Patient Instructions on file for this visit. No follow-ups on file.   Georgiana SpinnerFallon E Treyshaun Keatts, NP  This note was completed with Office managerDragon Dictation.  Any errors are purely unintentional.

## 2019-03-09 ENCOUNTER — Encounter (INDEPENDENT_AMBULATORY_CARE_PROVIDER_SITE_OTHER): Payer: Self-pay

## 2019-03-09 ENCOUNTER — Telehealth (INDEPENDENT_AMBULATORY_CARE_PROVIDER_SITE_OTHER): Payer: Self-pay

## 2019-03-09 NOTE — Telephone Encounter (Signed)
I attempted to contact the patient and a message was left for a return call. 

## 2019-03-09 NOTE — Telephone Encounter (Signed)
Patient called back and is now scheduled with Dr. Delana Meyer for left leg angio on 03/18/2019 with 6:45 am arrival to the [facility]. Patient will do covid testing on 03/13/2019 between 12:30-2:30 pm at the [place]. Pre-procedure instructions were discussed and will be mailed to the patient.

## 2019-03-13 ENCOUNTER — Other Ambulatory Visit
Admission: RE | Admit: 2019-03-13 | Discharge: 2019-03-13 | Disposition: A | Discharge status: Home / Self Care | Payer: Medicare Other | Source: Ambulatory Visit | Attending: Vascular Surgery | Admitting: Vascular Surgery

## 2019-03-13 DIAGNOSIS — Z20828 Contact with and (suspected) exposure to other viral communicable diseases: Secondary | ICD-10-CM | POA: Diagnosis not present

## 2019-03-13 DIAGNOSIS — Z01812 Encounter for preprocedural laboratory examination: Secondary | ICD-10-CM | POA: Insufficient documentation

## 2019-03-13 LAB — SARS CORONAVIRUS 2 (TAT 6-24 HRS): SARS Coronavirus 2: NEGATIVE

## 2019-03-17 ENCOUNTER — Other Ambulatory Visit (INDEPENDENT_AMBULATORY_CARE_PROVIDER_SITE_OTHER): Payer: Self-pay | Admitting: Nurse Practitioner

## 2019-03-18 ENCOUNTER — Encounter
Admission: RE | Disposition: A | Discharge status: Home / Self Care | Payer: Self-pay | Source: Home / Self Care | Attending: Vascular Surgery

## 2019-03-18 ENCOUNTER — Other Ambulatory Visit: Payer: Self-pay

## 2019-03-18 ENCOUNTER — Encounter: Payer: Self-pay | Admitting: Vascular Surgery

## 2019-03-18 ENCOUNTER — Ambulatory Visit
Admission: RE | Admit: 2019-03-18 | Discharge: 2019-03-18 | Disposition: A | Discharge status: Home / Self Care | Payer: Medicare Other | Attending: Vascular Surgery | Admitting: Vascular Surgery

## 2019-03-18 DIAGNOSIS — Z7902 Long term (current) use of antithrombotics/antiplatelets: Secondary | ICD-10-CM | POA: Insufficient documentation

## 2019-03-18 DIAGNOSIS — Z7982 Long term (current) use of aspirin: Secondary | ICD-10-CM | POA: Insufficient documentation

## 2019-03-18 DIAGNOSIS — E785 Hyperlipidemia, unspecified: Secondary | ICD-10-CM | POA: Insufficient documentation

## 2019-03-18 DIAGNOSIS — M797 Fibromyalgia: Secondary | ICD-10-CM | POA: Insufficient documentation

## 2019-03-18 DIAGNOSIS — Z79899 Other long term (current) drug therapy: Secondary | ICD-10-CM | POA: Diagnosis not present

## 2019-03-18 DIAGNOSIS — N182 Chronic kidney disease, stage 2 (mild): Secondary | ICD-10-CM | POA: Diagnosis not present

## 2019-03-18 DIAGNOSIS — I70223 Atherosclerosis of native arteries of extremities with rest pain, bilateral legs: Secondary | ICD-10-CM | POA: Insufficient documentation

## 2019-03-18 DIAGNOSIS — I2510 Atherosclerotic heart disease of native coronary artery without angina pectoris: Secondary | ICD-10-CM | POA: Diagnosis not present

## 2019-03-18 DIAGNOSIS — I129 Hypertensive chronic kidney disease with stage 1 through stage 4 chronic kidney disease, or unspecified chronic kidney disease: Secondary | ICD-10-CM | POA: Insufficient documentation

## 2019-03-18 DIAGNOSIS — M1990 Unspecified osteoarthritis, unspecified site: Secondary | ICD-10-CM | POA: Diagnosis not present

## 2019-03-18 DIAGNOSIS — I70213 Atherosclerosis of native arteries of extremities with intermittent claudication, bilateral legs: Secondary | ICD-10-CM | POA: Diagnosis not present

## 2019-03-18 DIAGNOSIS — I70229 Atherosclerosis of native arteries of extremities with rest pain, unspecified extremity: Secondary | ICD-10-CM

## 2019-03-18 HISTORY — PX: LOWER EXTREMITY ANGIOGRAPHY: CATH118251

## 2019-03-18 LAB — CREATININE, SERUM
Creatinine, Ser: 1.57 mg/dL — ABNORMAL HIGH (ref 0.44–1.00)
GFR calc Af Amer: 36 mL/min — ABNORMAL LOW (ref >60)
GFR calc non Af Amer: 31 mL/min — ABNORMAL LOW (ref >60)

## 2019-03-18 LAB — BUN: BUN: 36 mg/dL — ABNORMAL HIGH (ref 8–23)

## 2019-03-18 SURGERY — LOWER EXTREMITY ANGIOGRAPHY
Anesthesia: Moderate Sedation | Site: Leg Lower | Laterality: Left

## 2019-03-18 MED ORDER — FENTANYL CITRATE (PF) 100 MCG/2ML IJ SOLN
INTRAMUSCULAR | Status: AC
  Filled 2019-03-18: qty 2

## 2019-03-18 MED ORDER — FAMOTIDINE 20 MG PO TABS: 40.0000 mg | ORAL_TABLET | Freq: Once | ORAL | Status: DC | PRN

## 2019-03-18 MED ORDER — MORPHINE SULFATE (PF) 4 MG/ML IV SOLN: 2.0000 mg | INTRAVENOUS | Status: DC | PRN

## 2019-03-18 MED ORDER — MIDAZOLAM HCL 2 MG/ML PO SYRP: 8.0000 mg | ORAL_SOLUTION | Freq: Once | ORAL | Status: DC | PRN

## 2019-03-18 MED ORDER — SODIUM CHLORIDE 0.9 % IV SOLN: 250.0000 mL | INTRAVENOUS | Status: DC | PRN

## 2019-03-18 MED ORDER — HYDROMORPHONE HCL 1 MG/ML IJ SOLN: 1.0000 mg | Freq: Once | INTRAMUSCULAR | Status: DC | PRN

## 2019-03-18 MED ORDER — MIDAZOLAM HCL 5 MG/5ML IJ SOLN
INTRAMUSCULAR | Status: AC
  Filled 2019-03-18: qty 5

## 2019-03-18 MED ORDER — SODIUM CHLORIDE 0.9% FLUSH: 3.0000 mL | INTRAVENOUS | Status: DC | PRN

## 2019-03-18 MED ORDER — SODIUM CHLORIDE 0.9 % IV SOLN: INTRAVENOUS | Status: DC

## 2019-03-18 MED ORDER — HEPARIN SODIUM (PORCINE) 1000 UNIT/ML IJ SOLN
INTRAMUSCULAR | Status: AC
  Filled 2019-03-18: qty 1

## 2019-03-18 MED ORDER — IODIXANOL 320 MG/ML IV SOLN
INTRAVENOUS | Status: DC | PRN
  Administered 2019-03-18: 60 mL via INTRA_ARTERIAL

## 2019-03-18 MED ORDER — LABETALOL HCL 5 MG/ML IV SOLN: 10.0000 mg | INTRAVENOUS | Status: DC | PRN

## 2019-03-18 MED ORDER — ONDANSETRON HCL 4 MG/2ML IJ SOLN: 4.0000 mg | Freq: Four times a day (QID) | INTRAMUSCULAR | Status: DC | PRN

## 2019-03-18 MED ORDER — ACETAMINOPHEN 325 MG PO TABS: 650.0000 mg | ORAL_TABLET | ORAL | Status: DC | PRN

## 2019-03-18 MED ORDER — SODIUM CHLORIDE 0.9% FLUSH: 3.0000 mL | Freq: Two times a day (BID) | INTRAVENOUS | Status: DC

## 2019-03-18 MED ORDER — FENTANYL CITRATE (PF) 100 MCG/2ML IJ SOLN
INTRAMUSCULAR | Status: DC | PRN
  Administered 2019-03-18 (×2): 25 ug via INTRAVENOUS
  Administered 2019-03-18: 50 ug via INTRAVENOUS
  Administered 2019-03-18: 25 ug via INTRAVENOUS
  Administered 2019-03-18: 50 ug via INTRAVENOUS

## 2019-03-18 MED ORDER — DIPHENHYDRAMINE HCL 50 MG/ML IJ SOLN: 50.0000 mg | Freq: Once | INTRAMUSCULAR | Status: DC | PRN

## 2019-03-18 MED ORDER — HYDRALAZINE HCL 20 MG/ML IJ SOLN: 5.0000 mg | INTRAMUSCULAR | Status: DC | PRN

## 2019-03-18 MED ORDER — CEFAZOLIN SODIUM-DEXTROSE 2-4 GM/100ML-% IV SOLN
2.0000 g | Freq: Once | INTRAVENOUS | Status: AC
  Administered 2019-03-18: 2 g via INTRAVENOUS

## 2019-03-18 MED ORDER — METHYLPREDNISOLONE SODIUM SUCC 125 MG IJ SOLR: 125.0000 mg | Freq: Once | INTRAMUSCULAR | Status: DC | PRN

## 2019-03-18 MED ORDER — OXYCODONE HCL 5 MG PO TABS: 5.0000 mg | ORAL_TABLET | ORAL | Status: DC | PRN

## 2019-03-18 MED ORDER — MIDAZOLAM HCL 2 MG/2ML IJ SOLN
INTRAMUSCULAR | Status: DC | PRN
  Administered 2019-03-18: 1 mg via INTRAVENOUS
  Administered 2019-03-18: 2 mg via INTRAVENOUS
  Administered 2019-03-18 (×4): 1 mg via INTRAVENOUS

## 2019-03-18 SURGICAL SUPPLY — 14 items
BALLN LUTONIX DCB 6X60X130 (BALLOONS) ×2
BALLOON LUTONIX DCB 6X60X130 (BALLOONS) ×1 IMPLANT
CATH BEACON 5 .035 65 RIM TIP (CATHETERS) ×2 IMPLANT
DEVICE PRESTO INFLATION (MISCELLANEOUS) ×2 IMPLANT
DEVICE STARCLOSE SE CLOSURE (Vascular Products) ×2 IMPLANT
GLIDEWIRE ANGLED SS 035X260CM (WIRE) ×2 IMPLANT
NEEDLE ENTRY 21GA 7CM ECHOTIP (NEEDLE) ×2 IMPLANT
PACK ANGIOGRAPHY (CUSTOM PROCEDURE TRAY) ×2 IMPLANT
SET INTRO CAPELLA COAXIAL (SET/KITS/TRAYS/PACK) ×2 IMPLANT
SHEATH BRITE TIP 5FRX11 (SHEATH) ×2 IMPLANT
SHEATH RAABE 6FR (SHEATH) ×2 IMPLANT
STENT LIFESTENT 5F 6X60X135 (Permanent Stent) ×2 IMPLANT
WIRE J 3MM .035X145CM (WIRE) ×2 IMPLANT
WIRE MAGIC TORQUE 260C (WIRE) ×2 IMPLANT

## 2019-03-18 NOTE — Progress Notes (Signed)
Noted saturated gauze dressing to right groin. No hematoma, edema, ecchymosis. Dressing removed, manual pressure held x 10 min. New 4x4 drsg. And tegaderm to site. No hematoma, ecchymosis, active drainage after manual pressure.

## 2019-03-18 NOTE — Op Note (Signed)
[facility] Percutaneous Study/Intervention Procedural Note   Date of Surgery: 03/18/2019  Surgeon:  Katha Cabal, MD.  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 Introduction catheter into left lower extremity 3rd order catheter placement  2. Contrast injection left lower extremity for distal runoff   3. Percutaneous transluminal angioplasty and stent placement left superficial femoral artery  4. Star close closure right common femoral arteriotomy  Anesthesia: Conscious sedation was administered under my direct supervision by the interventional radiology RN. IV Versed plus fentanyl were utilized. Continuous ECG, pulse oximetry and blood pressure was monitored throughout the entire procedure.  Conscious sedation was for a total of 50 minutes.  Sheath: 6 [place] Raby right common femoral retrograde  Contrast: 60 cc  Fluoroscopy Time: 4.7 minutes  Indications: [name] Cooper presents with lifestyle limiting claudication of the left lower extremity.  She is status post treatment of her right lower extremity 1 month ago for rest pain symptoms.  Her right has now dramatically improved and she is requested that her left lower extremity be treated as well.  The risks and benefits are reviewed all questions answered patient agrees to proceed.  Procedure: [name] Cooper is a 78 y.o. y.o. female who was identified and appropriate procedural time out was performed. The patient was then placed supine on the table and prepped and draped in the usual sterile fashion.   Ultrasound was placed in the sterile sleeve and the right groin was evaluated the right common femoral artery was echolucent and pulsatile indicating patency.  Image was recorded  for the permanent record and under real-time visualization a microneedle was inserted into the common femoral artery microwire followed by a micro-sheath.  A J-wire was then advanced through the micro-sheath and a  5 [place] sheath was then inserted over a J-wire.  Rim catheter was positioned to above the bifurcation and a RAO view of the pelvis was obtained.  Subsequently a rim catheter with the stiff angle Glidewire was used to cross the aortic bifurcation the catheter wire were advanced down into the left distal external iliac artery. Oblique view of the femoral bifurcation was then obtained and subsequently the wire was reintroduced and the pigtail catheter negotiated into the SFA representing third order catheter placement. Distal runoff was then performed.  Diagnostic interpretation: The left common internal and external iliac arteries are widely patent no evidence of hemodynamically significant stenosis.  The left common femoral and profunda femoris are widely patent.  Proximally the left SFA is widely patent however at Hunter's canal there are 2 tandem lesions 1 of 70% 1 of 90%.  Distally the popliteal is free of hemodynamically significant stenosis and the trifurcation is widely patent.  There is two-vessel runoff to the foot via the posterior tibial and the peroneal.  The anterior tibial tibial is patent in its proximal two thirds.  It occludes abruptly consistent with the level of her previous fracture and orthopedic ankle repair.  Pedal arch is filled by the posterior tibial as well as collaterals from the peroneal.  Based on these findings I elected to move forward with intervention.  5000 units of heparin was then given and allowed to circulate and a 6 [place] Raby sheath was advanced up and over the bifurcation and positioned in the femoral artery  The Magic torque wire was then negotiated through the lesion.  A 6 mm x 60  mm life stent is deployed across the lesion and postdilated with a 6 mm x  60 mm Lutonix drug-eluting balloon.  Inflation was to 12 atmospheres for 1 minute. Follow-up imaging demonstrated patency with less than 5% residual stenosis. Distal runoff was then reassessed and found to be unchanged.  After review of these images the sheath is pulled into the right external iliac oblique of the common femoral is obtained and a Star close device deployed. There no immediate complications.   Findings:  The left common internal and external iliac arteries are widely patent no evidence of hemodynamically significant stenosis.  The left common femoral and profunda femoris are widely patent.  Proximally the left SFA is widely patent however at Hunter's canal there are 2 tandem lesions 1 of 70% 1 of 90%.  Distally the popliteal is free of hemodynamically significant stenosis and the trifurcation is widely patent.  There is two-vessel runoff to the foot via the posterior tibial and the peroneal.  The anterior tibial tibial is patent in its proximal two thirds.  It occludes abruptly consistent with the level of her previous fracture and orthopedic ankle repair.  Pedal arch is filled by the posterior tibial as well as collaterals from the peroneal.  Following angioplasty and stent placement the SFA is now widely patent with less than 5% residual stenosis and in-line flow and looks quite nice.     Summary: Successful recanalization left lower extremity for limb salvage    Disposition: Patient was taken to the recovery room in stable condition having tolerated the procedure well.  , [name] Cooper 03/18/2019,9:43 AM

## 2019-03-18 NOTE — Progress Notes (Signed)
Bedrest up: noted slight ooze at right groin again. Manual pressure held. Vascular lab phoned. Len Blalock, tech. Over and injected right groin with Lidocaine and EPI and held pressure total of 10 min. MD aware. PAD applied for 1 hour. Pt. Tolerated well. Pt. Daughter in at bedside for 15 min. And aware of situation. Daughter Vivien Rota to return in one hour for pt. Discharge home.

## 2019-03-18 NOTE — Progress Notes (Signed)
PAD removed. Sterile 4x4 and tegaderm drsg. To site. RFA site clean, dry, intact without any further ooze, hematoma, drainage, edema, ecchymosis. Soft to palpation. Pt. Ambulated to BR, then back to bed without any incident. Pt. Stable for DC home at this point.

## 2019-03-18 NOTE — H&P (Signed)
[facility] History & Physical Update  The patient was interviewed and re-examined.  The patient's previous History and Physical has been reviewed and is unchanged.  There is no change in the plan of care. We plan to proceed with the scheduled procedure.  Hortencia Pilar, MD  03/18/2019, 8:09 AM

## 2019-03-18 NOTE — Progress Notes (Signed)
MD made aware of Cre/BUN and GFR. New orders received to run IVF's at 100 ml/hr.

## 2019-03-23 ENCOUNTER — Other Ambulatory Visit (INDEPENDENT_AMBULATORY_CARE_PROVIDER_SITE_OTHER): Payer: Self-pay | Admitting: Nurse Practitioner

## 2019-03-23 ENCOUNTER — Telehealth (INDEPENDENT_AMBULATORY_CARE_PROVIDER_SITE_OTHER): Payer: Self-pay

## 2019-03-23 MED ORDER — TRAMADOL HCL 50 MG PO TABS: 50.0000 mg | ORAL_TABLET | Freq: Four times a day (QID) | ORAL | 0 refills | Status: DC | PRN

## 2019-03-23 NOTE — Telephone Encounter (Signed)
Patient called stating after her procedure ( 12/23 left leg angio) with Dr. Delana Meyer , she has been experiencing a lot of pain in the left leg with burning and a drop of blood at her groin. Patient states she barely sleeps at night and her right leg is also painful sometimes, just not as bad as the left. Patient is taking Aspirin, Lipitor, Plavix and Gabapentin. Patient states she is elevating her leg above heart level and wearing compression hose as well.

## 2019-03-23 NOTE — Telephone Encounter (Signed)
Tramadol will be called in to the patient's pharmacy at [place] [place] by Eulogio Ditch NP electronically.

## 2019-03-23 NOTE — Telephone Encounter (Signed)
The right lower extremity may be more so related to her neuropathy.  The left lower extremity had a significant lack of blood flow prior to the procedure and after the procedure the sudden increase of blood flow can cause swelling and also some nerve discomfort is common.  The drop of blood is also common and not of concern.  We can try some tramadol to see if this helps with her pain.  She should continue to wear compression, elevate and take her other medications.  If the patient would like to try tramadol, please confirm her pharmacy and we will call it in

## 2019-03-24 ENCOUNTER — Encounter: Payer: Self-pay | Admitting: Cardiology

## 2019-04-08 ENCOUNTER — Other Ambulatory Visit (INDEPENDENT_AMBULATORY_CARE_PROVIDER_SITE_OTHER): Payer: Self-pay | Admitting: Vascular Surgery

## 2019-04-08 DIAGNOSIS — Z95820 Peripheral vascular angioplasty status with implants and grafts: Secondary | ICD-10-CM

## 2019-04-08 DIAGNOSIS — I70212 Atherosclerosis of native arteries of extremities with intermittent claudication, left leg: Secondary | ICD-10-CM

## 2019-04-09 ENCOUNTER — Other Ambulatory Visit: Payer: Self-pay

## 2019-04-09 ENCOUNTER — Ambulatory Visit (INDEPENDENT_AMBULATORY_CARE_PROVIDER_SITE_OTHER): Payer: Medicare Other | Admitting: Nurse Practitioner

## 2019-04-09 ENCOUNTER — Encounter (INDEPENDENT_AMBULATORY_CARE_PROVIDER_SITE_OTHER): Payer: Self-pay | Admitting: Nurse Practitioner

## 2019-04-09 ENCOUNTER — Ambulatory Visit (INDEPENDENT_AMBULATORY_CARE_PROVIDER_SITE_OTHER): Payer: Medicare Other

## 2019-04-09 VITALS — BP 175/77 | HR 118 | Resp 12 | Ht 66.0 in | Wt 118.0 lb

## 2019-04-09 DIAGNOSIS — Z95820 Peripheral vascular angioplasty status with implants and grafts: Secondary | ICD-10-CM

## 2019-04-09 DIAGNOSIS — I10 Essential (primary) hypertension: Secondary | ICD-10-CM | POA: Diagnosis not present

## 2019-04-09 DIAGNOSIS — I70212 Atherosclerosis of native arteries of extremities with intermittent claudication, left leg: Secondary | ICD-10-CM

## 2019-04-09 DIAGNOSIS — E785 Hyperlipidemia, unspecified: Secondary | ICD-10-CM | POA: Diagnosis not present

## 2019-04-09 DIAGNOSIS — I70223 Atherosclerosis of native arteries of extremities with rest pain, bilateral legs: Secondary | ICD-10-CM | POA: Diagnosis not present

## 2019-04-09 NOTE — Progress Notes (Signed)
SUBJECTIVE:  Patient ID: [name] Cooper, female    DOB: [birth year], 79 y.o.   MRN: [id number] Chief Complaint  Patient presents with  . Follow-up    U/S Follow up    HPI  Raffaella Blacklock is a 79 y.o. female The patient returns to the office for followup and review status post angiogram with intervention. The patient notes improvement in the lower extremity symptoms. No interval shortening of the patient's claudication distance or rest pain symptoms. Previous wounds have now healed.  No new ulcers or wounds have occurred since the last visit.  The patient does endorse having some burning in her right lower extremity still but it is much less than previously.  There have been no significant changes to the patient's overall health care.  The patient denies amaurosis fugax or recent TIA symptoms. There are no recent neurological changes noted. The patient denies history of DVT, PE or superficial thrombophlebitis. The patient denies recent episodes of angina or shortness of breath.   ABI's Rt=1.02 and Lt=0.94  (previous ABI's Rt=1.19 and Lt=1.10) Duplex US of the right lower extremity shows biphasic/triphasic waveforms whereas the left shows monophasic/biphasic waveforms in the bilateral tibial arteries.  The patient has good toe waveforms on the right with dampened on the left.  The patient has a great toe pressure of 84 on the right and 65 on the left.  Past Medical History:  Diagnosis Date  . Arthritis    left hip, left ankle  . Cholelithiases   . CKD (chronic kidney disease), stage II   . Coronary artery disease    a. cardiac cath 01/2011: mid LAD stent patent, mild lumenal irreg's, nl EF 55%, no AS/MR  . Fibromyalgia   . Frozen shoulder   . History of kidney stones   . HTN (hypertension)    controlled with medication;   . Hyperlipidemia    a. statin intolerant  . Iron deficiency   . Thrombocytopenia ([place])   . UTI (urinary tract infection)   . Venous stasis     Past  Surgical History:  Procedure Laterality Date  . CARDIAC CATHETERIZATION     stents placed in mid LAD  . CERVICAL SPINE SURGERY    . FETAL BLOOD TRANSFUSION  2014  . Frozen Shoulder    . KIDNEY STONE SURGERY    . LEG SURGERY     broke her leg   . LOWER EXTREMITY ANGIOGRAPHY Right 02/17/2019   Procedure: LOWER EXTREMITY ANGIOGRAPHY;  Surgeon: Katha Cabal, MD;  Location: [facility];  Service: Cardiovascular;  Laterality: Right;  . LOWER EXTREMITY ANGIOGRAPHY Left 03/18/2019   Procedure: LOWER EXTREMITY ANGIOGRAPHY;  Surgeon: Katha Cabal, MD;  Location: [facility];  Service: Cardiovascular;  Laterality: Left;    Social History   Socioeconomic History  . Marital status: Widowed    Spouse name: Not on file  . Number of children: 3  . Years of education: Not on file  . Highest education level: Bachelor's degree (e.g., BA, AB, BS)  Occupational History  . Not on file  Tobacco Use  . Smoking status: Never Smoker  . Smokeless tobacco: Never Used  Substance and Sexual Activity  . Alcohol use: No  . Drug use: No  . Sexual activity: Not on file  Other Topics Concern  . Not on file  Social History Narrative  . Not on file   Social Determinants of Health   Financial Resource Strain:   . Difficulty of  Paying Living Expenses: Not on file  Food Insecurity:   . Worried About Programme researcher, broadcasting/film/video in the Last Year: Not on file  . Ran Out of Food in the Last Year: Not on file  Transportation Needs:   . Lack of Transportation (Medical): Not on file  . Lack of Transportation (Non-Medical): Not on file  Physical Activity:   . Days of Exercise per Week: Not on file  . Minutes of Exercise per Session: Not on file  Stress:   . Feeling of Stress : Not on file  Social Connections:   . Frequency of Communication with Friends and Family: Not on file  . Frequency of Social Gatherings with Friends and Family: Not on file  . Attends Religious Services: Not on  file  . Active Member of Clubs or Organizations: Not on file  . Attends Banker Meetings: Not on file  . Marital Status: Not on file  Intimate Partner Violence:   . Fear of Current or Ex-Partner: Not on file  . Emotionally Abused: Not on file  . Physically Abused: Not on file  . Sexually Abused: Not on file    Family History  Problem Relation Age of Onset  . Hypertension Mother   . Heart attack Mother   . Heart attack Father   . Heart disease Brother        h/o CABG  . Diabetes Brother   . Kidney disease Brother   . Kidney cancer Neg Hx   . Bladder Cancer Neg Hx     Allergies  Allergen Reactions  . Sulfa Antibiotics Anaphylaxis     Review of Systems   Review of Systems: Negative Unless Checked Constitutional: [] Weight loss  [] Fever  [] Chills Cardiac: [] Chest pain   []  Atrial Fibrillation  [] Palpitations   [] Shortness of breath when laying flat   [] Shortness of breath with exertion. [] Shortness of breath at rest Vascular:  [] Pain in legs with walking   [] Pain in legs with standing [] Pain in legs when laying flat   [] Claudication    [] Pain in feet when laying flat    [] History of DVT   [] Phlebitis   [] Swelling in legs   [x] Varicose veins   [] Non-healing ulcers Pulmonary:   [] Uses home oxygen   [] Productive cough   [] Hemoptysis   [] Wheeze  [] COPD   [] Asthma Neurologic:  [] Dizziness   [] Seizures  [] Blackouts [] History of stroke   [] History of TIA  [] Aphasia   [] Temporary Blindness   [] Weakness or numbness in arm   [] Weakness or numbness in leg Musculoskeletal:   [] Joint swelling   [] Joint pain   [] Low back pain  []  History of Knee Replacement [x] Arthritis [] back Surgeries  []  Spinal Stenosis    Hematologic:  [] Easy bruising  [] Easy bleeding   [] Hypercoagulable state   [] Anemic Gastrointestinal:  [] Diarrhea   [] Vomiting  [] Gastroesophageal reflux/heartburn   [] Difficulty swallowing. [] Abdominal pain Genitourinary:  [] Chronic kidney disease   [] Difficult urination   [] Anuric   [] Blood in urine [] Frequent urination  [] Burning with urination   [] Hematuria Skin:  [x] Rashes   [] Ulcers [] Wounds Psychological:  [] History of anxiety   []  History of major depression  []  Memory Difficulties      OBJECTIVE:   Physical Exam  BP (!) 175/77 (BP Location: Right Arm)   Pulse (!) 118   Resp 12   Ht 5\' 6"  (1.676 m)   Wt 118 lb (53.5 kg)   BMI 19.05 kg/m   Gen: WD/WN, NAD Head:  [place]/AT, No temporalis wasting.  Ear/Nose/Throat: Hearing grossly intact, nares w/o erythema or drainage Eyes: PER, EOMI, sclera nonicteric.  Neck: Supple, no masses.  No JVD.  Pulmonary:  Good air movement, no use of accessory muscles.  Cardiac: RRR Vascular:  Vessel Right Left  Radial Palpable Palpable  Dorsalis Pedis Palpable Palpable  Posterior Tibial Palpable Palpable   Gastrointestinal: soft, non-distended. No guarding/no peritoneal signs.  Musculoskeletal: M/S 5/5 throughout.  No deformity or atrophy.  Neurologic: Pain and light touch intact in extremities.  Symmetrical.  Speech is fluent. Motor exam as listed above. Psychiatric: Judgment intact, Mood & affect appropriate for pt's clinical situation. Dermatologic: No Venous rashes. No Ulcers Noted.  No changes consistent with cellulitis. Lymph : No Cervical lymphadenopathy, no lichenification or skin changes of chronic lymphedema.       ASSESSMENT AND PLAN:  1. Atherosclerosis of native artery of both lower extremities with rest pain (HCC) Recommend:  The patient is status post successful angiogram with intervention.  The patient reports that the claudication symptoms and leg pain is essentially gone.   The patient denies lifestyle limiting changes at this point in time.  No further invasive studies, angiography or surgery at this time The patient should continue walking and begin a more formal exercise program.  The patient should continue antiplatelet therapy and aggressive treatment of the lipid  abnormalities   The patient should continue wearing graduated compression socks 10-15 mmHg strength to control the mild edema.  Patient should undergo noninvasive studies as ordered. The patient will follow up with me after the studies.   - VAS [place] ABI WITH/WO TBI; Future - VAS [place] LOWER EXTREMITY ARTERIAL DUPLEX; Future  2. Essential hypertension Continue antihypertensive medications as already ordered, these medications have been reviewed and there are no changes at this time.   3. Hyperlipidemia, unspecified hyperlipidemia type Continue statin as ordered and reviewed, no changes at this time     Current Outpatient Medications on File Prior to Visit  Medication Sig Dispense Refill  . aspirin EC 81 MG tablet Take 1 tablet (81 mg total) by mouth daily. 150 tablet 2  . atorvastatin (LIPITOR) 10 MG tablet TAKE 1 TABLET BY MOUTH DAILY 90 tablet 0  . clopidogrel (PLAVIX) 75 MG tablet Take 1 tablet (75 mg total) by mouth daily. 30 tablet 4  . gabapentin (NEURONTIN) 100 MG capsule Take 100 mg by mouth at bedtime.    Marland Kitchen losartan-hydrochlorothiazide (HYZAAR) 100-25 MG tablet Take 1 tablet by mouth daily. 90 tablet 0  . temazepam (RESTORIL) 30 MG capsule Take 30 mg by mouth at bedtime.     . traMADol (ULTRAM) 50 MG tablet Take 1 tablet (50 mg total) by mouth every 6 (six) hours as needed. (Patient not taking: Reported on 04/09/2019) 30 tablet 0   No current facility-administered medications on file prior to visit.    There are no Patient Instructions on file for this visit. No follow-ups on file.   Georgiana Spinner, NP  This note was completed with Office manager.  Any errors are purely unintentional.

## 2019-04-19 ENCOUNTER — Other Ambulatory Visit: Payer: Self-pay

## 2019-04-19 ENCOUNTER — Emergency Department: Payer: Medicare Other

## 2019-04-19 ENCOUNTER — Emergency Department
Admission: EM | Admit: 2019-04-19 | Discharge: 2019-04-19 | Disposition: A | Discharge status: Home / Self Care | Payer: Medicare Other | Attending: Emergency Medicine | Admitting: Emergency Medicine

## 2019-04-19 DIAGNOSIS — Y92481 Parking lot as the place of occurrence of the external cause: Secondary | ICD-10-CM | POA: Insufficient documentation

## 2019-04-19 DIAGNOSIS — N182 Chronic kidney disease, stage 2 (mild): Secondary | ICD-10-CM | POA: Insufficient documentation

## 2019-04-19 DIAGNOSIS — M25562 Pain in left knee: Secondary | ICD-10-CM

## 2019-04-19 DIAGNOSIS — Y9389 Activity, other specified: Secondary | ICD-10-CM | POA: Insufficient documentation

## 2019-04-19 DIAGNOSIS — Y998 Other external cause status: Secondary | ICD-10-CM | POA: Insufficient documentation

## 2019-04-19 DIAGNOSIS — Z79899 Other long term (current) drug therapy: Secondary | ICD-10-CM | POA: Diagnosis not present

## 2019-04-19 DIAGNOSIS — M545 Low back pain, unspecified: Secondary | ICD-10-CM

## 2019-04-19 DIAGNOSIS — Z7982 Long term (current) use of aspirin: Secondary | ICD-10-CM | POA: Diagnosis not present

## 2019-04-19 DIAGNOSIS — I129 Hypertensive chronic kidney disease with stage 1 through stage 4 chronic kidney disease, or unspecified chronic kidney disease: Secondary | ICD-10-CM | POA: Insufficient documentation

## 2019-04-19 DIAGNOSIS — I2510 Atherosclerotic heart disease of native coronary artery without angina pectoris: Secondary | ICD-10-CM | POA: Diagnosis not present

## 2019-04-19 MED ORDER — LIDOCAINE 5 % EX PTCH
1.0000 | MEDICATED_PATCH | Freq: Once | CUTANEOUS | Status: DC
  Administered 2019-04-19: 1 via TRANSDERMAL
  Filled 2019-04-19: qty 1

## 2019-04-19 MED ORDER — LIDOCAINE 5 % EX PTCH: 1.0000 | MEDICATED_PATCH | Freq: Two times a day (BID) | CUTANEOUS | 0 refills | Status: DC
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
## 2019-05-05 ENCOUNTER — Other Ambulatory Visit: Payer: Self-pay | Admitting: Cardiovascular Disease

## 2019-05-11 DIAGNOSIS — R413 Other amnesia: Secondary | ICD-10-CM | POA: Insufficient documentation

## 2019-05-15 ENCOUNTER — Other Ambulatory Visit (INDEPENDENT_AMBULATORY_CARE_PROVIDER_SITE_OTHER): Payer: Self-pay | Admitting: Vascular Surgery

## 2019-06-02 ENCOUNTER — Ambulatory Visit: Payer: Medicare Other | Admitting: Cardiovascular Disease

## 2019-06-07 NOTE — Progress Notes (Signed)
Cardiology Office Note  Date:  06/09/2019   ID:  [name] Cooper, DOB [birth year], MRN [id number]  PCP:  [name], MD   Chief Complaint  Patient presents with  . office visit    P has no complaints. Meds verbally reviewed w/ pt.    HPI:  [name] Cooper is a very pleasant 79 year old woman with history of  coronary artery disease,  stent placed to her mid LAD with repeat stenting for in-stent restenosis Over 10 years ago,  hyperlipidemia,  hypertension,  fibromyalgia and diffuse muscle aches and arthritis  who presents for Followup of her coronary artery disease. Moderate bilateral carotid arterial disease  works as a crossing guard  Recently had several procedures with Dr. Ronalee Belts Records reviewed with her 01/2019 left lower extremity for limb salvage 02/2019 left leg angioplasty and stent placement the SFA  Her biggest complaint is she continues to have "Leg burning"  better somewhat after stents placed Neuropathy Reports that she has gabapentin but does not take it  Carotid ultrasound reviewed with her Right Carotid: Velocities in the right ICA are consistent with a 1-39%  stenosis. The ECA appears >50% stenosed.  Left Carotid: Velocities in the left ICA are consistent with a 40-59%  stenosis.  ------She has bilateral 40-59% carotid arterial disease, bruit on the right Last documented 2015  Lab work reviewed with her Total chol 214  Discussed high cholesterol, Previously Did not want a statin "Had myalgias on crestor and lipitor" Now is taking Lipitor with no symptoms, stopped her Zetia for some reason  Denies any chest pain , SOB  EKG personally reviewed by myself on todays visit Shows normal sinus rhythm rate 63 bpm no significant ST or T wave changes  Other past medical history Cardiac catheter at [facility] for chest discomfort earlier in the year showed patent LAD stent with no other significant stenoses.    PMH:   has a past medical history of Arthritis,  Cholelithiases, CKD (chronic kidney disease), stage II, Coronary artery disease, Fibromyalgia, Frozen shoulder, History of kidney stones, HTN (hypertension), Hyperlipidemia, Iron deficiency, Thrombocytopenia ([place]), UTI (urinary tract infection), and Venous stasis.  PSH:    Past Surgical History:  Procedure Laterality Date  . CARDIAC CATHETERIZATION     stents placed in mid LAD  . CERVICAL SPINE SURGERY    . FETAL BLOOD TRANSFUSION  2014  . Frozen Shoulder    . KIDNEY STONE SURGERY    . LEG SURGERY     broke her leg   . LOWER EXTREMITY ANGIOGRAPHY Right 02/17/2019   Procedure: LOWER EXTREMITY ANGIOGRAPHY;  Surgeon: Katha Cabal, MD;  Location: [place] CV LAB;  Service: Cardiovascular;  Laterality: Right;  . LOWER EXTREMITY ANGIOGRAPHY Left 03/18/2019   Procedure: LOWER EXTREMITY ANGIOGRAPHY;  Surgeon: Katha Cabal, MD;  Location: [place] CV LAB;  Service: Cardiovascular;  Laterality: Left;    Current Outpatient Medications  Medication Sig Dispense Refill  . aspirin EC 81 MG tablet Take 1 tablet (81 mg total) by mouth daily. 150 tablet 2  . atorvastatin (LIPITOR) 10 MG tablet TAKE 1 TABLET BY MOUTH DAILY 90 tablet 0  . clopidogrel (PLAVIX) 75 MG tablet Take 1 tablet (75 mg total) by mouth daily. 30 tablet 4  . gabapentin (NEURONTIN) 100 MG capsule Take 100 mg by mouth at bedtime.    Marland Kitchen losartan-hydrochlorothiazide (HYZAAR) 100-25 MG tablet TAKE 1 TABLET BY MOUTH DAILY 90 tablet 0  . temazepam (RESTORIL) 30 MG capsule Take 30 mg by  mouth at bedtime.      No current facility-administered medications for this visit.    Allergies:   Sulfa antibiotics   Social History:  The patient  reports that she has never smoked. She has never used smokeless tobacco. She reports that she does not drink alcohol or use drugs.   Family History:   family history includes Diabetes in her brother; Heart attack in her father and mother; Heart disease in her brother; Hypertension in her  mother; Kidney disease in her brother.    Review of Systems: Review of Systems  Constitutional: Negative.   Respiratory: Positive for shortness of breath.   Cardiovascular: Negative.   Gastrointestinal: Negative.   Musculoskeletal: Negative.        Leg weakness, gait instability, muscle pain in the legs  Neurological: Negative.   Psychiatric/Behavioral: Negative.   All other systems reviewed and are negative.    PHYSICAL EXAM: VS:  BP (!) 112/50 (BP Location: Left Arm, Patient Position: Sitting, Cuff Size: Large)   Pulse 63   Ht 5\' 7"  (1.702 m)   Wt 189 lb 4 oz (85.8 kg)   BMI 29.64 kg/m  , BMI Body mass index is 29.64 kg/m. Constitutional:  oriented to person, place, and time. No distress.  HENT:  Head: Grossly normal Eyes:  no discharge. No scleral icterus.  Neck: No JVD, no carotid bruits  Cardiovascular: Regular rate and rhythm, no murmurs appreciated Pulmonary/Chest: Clear to auscultation bilaterally, no wheezes or rails Abdominal: Soft.  no distension.  no tenderness.  Musculoskeletal: Normal range of motion Neurological:  normal muscle tone. Coordination normal. No atrophy Skin: Skin warm and dry Psychiatric: normal affect, pleasant  Recent Labs: 03/18/2019: BUN 36; Creatinine, Ser 1.57    Lipid Panel Lab Results  Component Value Date   CHOL 214 (H) 12/31/2014   HDL 65 12/31/2014   LDLCALC 131 (H) 12/31/2014   TRIG 88 12/31/2014      Wt Readings from Last 3 Encounters:  06/09/19 189 lb 4 oz (85.8 kg)  04/19/19 118 lb (53.5 kg)  04/09/19 118 lb (53.5 kg)       ASSESSMENT AND PLAN:  Coronary artery disease of native artery of native heart with stable angina pectoris (HCC) - Plan: EKG 12-Lead Currently with no symptoms of angina. No further workup at this time. Continue current medication regimen.  Bilateral carotid artery stenosis Moderate disease,  U/s reviewed with her Recommend increase Lipitor to 20 and restart Zetia Goal LDL less than  70  Essential hypertension, benign Blood pressure is well controlled on today's visit. No changes made to the medications.  Mixed hyperlipidemia Increase atorvastatin to 20 mg daily Restart Zetia 10 mg daily Goal LDL less than 70  PAD Long discussion concerning stenting and work down to her lower legs SFA Stressed importance of aggressive lipid control and medication compliance  Disposition:   F/U  12 months   Total encounter time more than 45 minutes  Greater than 50% was spent in counseling and coordination of care with the patient    Orders Placed This Encounter  Procedures  . EKG 12-Lead     Signed, 04/11/19, M.D., Ph.D. 06/09/2019  [facility] [place], [place]

## 2019-06-08 ENCOUNTER — Other Ambulatory Visit: Payer: Self-pay | Admitting: Cardiovascular Disease

## 2019-06-08 ENCOUNTER — Ambulatory Visit (INDEPENDENT_AMBULATORY_CARE_PROVIDER_SITE_OTHER): Payer: Medicare Other

## 2019-06-08 ENCOUNTER — Other Ambulatory Visit: Payer: Self-pay

## 2019-06-08 DIAGNOSIS — I6523 Occlusion and stenosis of bilateral carotid arteries: Secondary | ICD-10-CM

## 2019-06-09 ENCOUNTER — Ambulatory Visit (INDEPENDENT_AMBULATORY_CARE_PROVIDER_SITE_OTHER): Payer: Medicare Other | Admitting: Cardiovascular Disease

## 2019-06-09 ENCOUNTER — Encounter: Payer: Self-pay | Admitting: Cardiovascular Disease

## 2019-06-09 VITALS — BP 112/50 | HR 63 | Ht 67.0 in | Wt 189.2 lb

## 2019-06-09 DIAGNOSIS — E785 Hyperlipidemia, unspecified: Secondary | ICD-10-CM

## 2019-06-09 DIAGNOSIS — I25118 Atherosclerotic heart disease of native coronary artery with other forms of angina pectoris: Secondary | ICD-10-CM | POA: Diagnosis not present

## 2019-06-09 DIAGNOSIS — I70229 Atherosclerosis of native arteries of extremities with rest pain, unspecified extremity: Secondary | ICD-10-CM

## 2019-06-09 DIAGNOSIS — I10 Essential (primary) hypertension: Secondary | ICD-10-CM

## 2019-06-09 DIAGNOSIS — I6529 Occlusion and stenosis of unspecified carotid artery: Secondary | ICD-10-CM

## 2019-06-09 DIAGNOSIS — I739 Peripheral vascular disease, unspecified: Secondary | ICD-10-CM | POA: Diagnosis not present

## 2019-06-09 DIAGNOSIS — I6523 Occlusion and stenosis of bilateral carotid arteries: Secondary | ICD-10-CM

## 2019-06-09 MED ORDER — EZETIMIBE 10 MG PO TABS: 10.0000 mg | ORAL_TABLET | Freq: Every day | ORAL | 3 refills | Status: DC

## 2019-06-09 MED ORDER — ATORVASTATIN CALCIUM 20 MG PO TABS: 20.0000 mg | ORAL_TABLET | Freq: Every day | ORAL | 3 refills | Status: DC

## 2019-06-09 NOTE — Patient Instructions (Addendum)
Medication Instructions:  Your physician has recommended you make the following change in your medication:  1. START Ezetimibe 10 mg once daily 2. INCREASE Atorvastatin to 20 mg once daily  Ask Dr. Burnett Sheng for neurontin/gabapentin For burning pain  If you need a refill on your cardiac medications before your next appointment, please call your pharmacy.    Lab work: No new labs needed   If you have labs (blood work) drawn today and your tests are completely normal, you will receive your results only by: Marland Kitchen MyChart Message (if you have MyChart) OR . A paper copy in the mail If you have any lab test that is abnormal or we need to change your treatment, we will call you to review the results.   Testing/Procedures: No new testing needed   Follow-Up: At [facility], you and your health needs are our priority.  As part of our continuing mission to provide you with exceptional heart care, we have created designated Provider Care Teams.  These Care Teams include your primary Cardiologist (physician) and Advanced Practice Providers (APPs -  Physician Assistants and Nurse Practitioners) who all work together to provide you with the care you need, when you need it.  . You will need a follow up appointment in 12 months   . Providers on your designated Care Team:   . Nicolasa Ducking, NP . Eula Listen, PA-C . Marisue Ivan, PA-C  Any Other Special Instructions Will Be Listed Below (If Applicable).  For educational health videos Log in to : [email or web address] Or : [email or web address], password : [email or web address]

## 2019-07-08 ENCOUNTER — Other Ambulatory Visit: Payer: Self-pay

## 2019-07-08 ENCOUNTER — Encounter (INDEPENDENT_AMBULATORY_CARE_PROVIDER_SITE_OTHER): Payer: Self-pay | Admitting: Nurse Practitioner

## 2019-07-08 ENCOUNTER — Ambulatory Visit (INDEPENDENT_AMBULATORY_CARE_PROVIDER_SITE_OTHER): Payer: Medicare Other | Admitting: Nurse Practitioner

## 2019-07-08 ENCOUNTER — Ambulatory Visit (INDEPENDENT_AMBULATORY_CARE_PROVIDER_SITE_OTHER): Payer: Medicare Other

## 2019-07-08 VITALS — BP 180/68 | HR 66 | Resp 16

## 2019-07-08 DIAGNOSIS — E785 Hyperlipidemia, unspecified: Secondary | ICD-10-CM | POA: Diagnosis not present

## 2019-07-08 DIAGNOSIS — I6529 Occlusion and stenosis of unspecified carotid artery: Secondary | ICD-10-CM

## 2019-07-08 DIAGNOSIS — I10 Essential (primary) hypertension: Secondary | ICD-10-CM

## 2019-07-08 DIAGNOSIS — I70223 Atherosclerosis of native arteries of extremities with rest pain, bilateral legs: Secondary | ICD-10-CM

## 2019-07-08 NOTE — Progress Notes (Signed)
Subjective:    Patient ID: [name] Cooper, female    DOB: 03/31/1940, 79 y.o.   MRN: [id number] Chief Complaint  Patient presents with  . Follow-up    ultrasound follow up    The patient returns to the office for followup and review of the noninvasive studies. There have been no interval changes in lower extremity symptoms. No interval shortening of the patient's claudication distance or development of rest pain symptoms. No new ulcers or wounds have occurred since the last visit.  There have been no significant changes to the patient's overall health care.  The patient denies amaurosis fugax or recent TIA symptoms. There are no recent neurological changes noted. The patient denies history of DVT, PE or superficial thrombophlebitis. The patient denies recent episodes of angina or shortness of breath.   The patient does complain of burning in some instances of her left lower extremity.  She states that it was slightly better after the angiogram but he still continues.  Based on patient's description it is likely related to some neuropathy.  Patient also reports not taking her gabapentin.  ABI Rt=1.12 and Lt=1.08  (previous ABI's Rt=1.02 and Lt=0.99) Duplex ultrasound of the right lower extremity reveals biphasic waveforms from the femoral artery to the distal tibial arteries with patent stents.  The left lower extremity has triphasic waveforms down to the level of the distal SFA with biphasic monophasic in the distal tibial arteries.   Review of Systems  Cardiovascular: Positive for leg swelling.  Musculoskeletal: Positive for arthralgias and joint swelling.  All other systems reviewed and are negative.      Objective:   Physical Exam Vitals reviewed.  Constitutional:      Appearance: Normal appearance.  HENT:     Head: Normocephalic.  Cardiovascular:     Pulses: Normal pulses.  Neurological:     Mental Status: She is alert and oriented to person, place, and time.    Psychiatric:        Mood and Affect: Mood normal.        Behavior: Behavior normal.        Thought Content: Thought content normal.        Judgment: Judgment normal.     BP (!) 180/68 (BP Location: Right Arm)   Pulse 66   Resp 16   Past Medical History:  Diagnosis Date  . Arthritis    left hip, left ankle  . Cholelithiases   . CKD (chronic kidney disease), stage II   . Coronary artery disease    a. cardiac cath 01/2011: mid LAD stent patent, mild lumenal irreg's, nl EF 55%, no AS/MR  . Fibromyalgia   . Frozen shoulder   . History of kidney stones   . HTN (hypertension)    controlled with medication;   . Hyperlipidemia    a. statin intolerant  . Iron deficiency   . Thrombocytopenia (HCC)   . UTI (urinary tract infection)   . Venous stasis     Social History   Socioeconomic History  . Marital status: Widowed    Spouse name: Not on file  . Number of children: 3  . Years of education: Not on file  . Highest education level: Bachelor's degree (e.g., BA, AB, BS)  Occupational History  . Not on file  Tobacco Use  . Smoking status: Never Smoker  . Smokeless tobacco: Never Used  Substance and Sexual Activity  . Alcohol use: No  . Drug use: No  . Sexual activity:  Not on file  Other Topics Concern  . Not on file  Social History Narrative  . Not on file   Social Determinants of Health   Financial Resource Strain:   . Difficulty of Paying Living Expenses:   Food Insecurity:   . Worried About Programme researcher, broadcasting/film/video in the Last Year:   . Barista in the Last Year:   Transportation Needs:   . Freight forwarder (Medical):   Marland Kitchen Lack of Transportation (Non-Medical):   Physical Activity:   . Days of Exercise per Week:   . Minutes of Exercise per Session:   Stress:   . Feeling of Stress :   Social Connections:   . Frequency of Communication with Friends and Family:   . Frequency of Social Gatherings with Friends and Family:   . Attends Religious Services:    . Active Member of Clubs or Organizations:   . Attends Banker Meetings:   Marland Kitchen Marital Status:   Intimate Partner Violence:   . Fear of Current or Ex-Partner:   . Emotionally Abused:   Marland Kitchen Physically Abused:   . Sexually Abused:     Past Surgical History:  Procedure Laterality Date  . CARDIAC CATHETERIZATION     stents placed in mid LAD  . CERVICAL SPINE SURGERY    . FETAL BLOOD TRANSFUSION  2014  . Frozen Shoulder    . KIDNEY STONE SURGERY    . LEG SURGERY     broke her leg   . LOWER EXTREMITY ANGIOGRAPHY Right 02/17/2019   Procedure: LOWER EXTREMITY ANGIOGRAPHY;  Surgeon: Renford Dills, MD;  Location: [facility];  Service: Cardiovascular;  Laterality: Right;  . LOWER EXTREMITY ANGIOGRAPHY Left 03/18/2019   Procedure: LOWER EXTREMITY ANGIOGRAPHY;  Surgeon: Renford Dills, MD;  Location: [facility];  Service: Cardiovascular;  Laterality: Left;    Family History  Problem Relation Age of Onset  . Hypertension Mother   . Heart attack Mother   . Heart attack Father   . Heart disease Brother        h/o CABG  . Diabetes Brother   . Kidney disease Brother   . Kidney cancer Neg Hx   . Bladder Cancer Neg Hx     Allergies  Allergen Reactions  . Sulfa Antibiotics Anaphylaxis       Assessment & Plan:   1. Atherosclerosis of native artery of both lower extremities with rest pain (HCC)  Recommend:  The patient has evidence of atherosclerosis of the lower extremities with claudication.  The patient does not voice lifestyle limiting changes at this point in time.  Noninvasive studies do not suggest clinically significant change.  No invasive studies, angiography or surgery at this time The patient should continue walking and begin a more formal exercise program.  The patient should continue antiplatelet therapy and aggressive treatment of the lipid abnormalities  No changes in the patient's medications at this time  The patient  should continue wearing graduated compression socks 10-15 mmHg strength to control the mild edema.    2. Essential hypertension Continue antihypertensive medications as already ordered, these medications have been reviewed and there are no changes at this time.   3. Stenosis of carotid artery, unspecified laterality Patient recently had carotid artery duplex with her cardiologist.  Velocities were in a mild to moderate range.  She will continue to follow-up in 1 year.  We will continue antiplatelet medication as ordered.  4. Hyperlipidemia,  unspecified hyperlipidemia type Continue statin as ordered and reviewed, no changes at this time    Current Outpatient Medications on File Prior to Visit  Medication Sig Dispense Refill  . aspirin EC 81 MG tablet Take 1 tablet (81 mg total) by mouth daily. 150 tablet 2  . atorvastatin (LIPITOR) 20 MG tablet Take 1 tablet (20 mg total) by mouth daily. 90 tablet 3  . clopidogrel (PLAVIX) 75 MG tablet Take 1 tablet (75 mg total) by mouth daily. 30 tablet 4  . ezetimibe (ZETIA) 10 MG tablet Take 1 tablet (10 mg total) by mouth daily. 90 tablet 3  . temazepam (RESTORIL) 30 MG capsule Take 30 mg by mouth at bedtime.     . gabapentin (NEURONTIN) 100 MG capsule Take 100 mg by mouth at bedtime.    Marland Kitchen losartan-hydrochlorothiazide (HYZAAR) 100-25 MG tablet TAKE 1 TABLET BY MOUTH DAILY (Patient not taking: Reported on 07/08/2019) 90 tablet 0   No current facility-administered medications on file prior to visit.    There are no Patient Instructions on file for this visit. No follow-ups on file.   Kris Hartmann, NP

## 2019-09-23 ENCOUNTER — Ambulatory Visit
Admission: RE | Admit: 2019-09-23 | Discharge: 2019-09-23 | Disposition: A | Discharge status: Home / Self Care | Payer: Medicare Other | Source: Ambulatory Visit | Attending: Urology | Admitting: Urology

## 2019-09-23 ENCOUNTER — Ambulatory Visit: Payer: Medicare Other | Admitting: Urology

## 2019-09-23 ENCOUNTER — Ambulatory Visit
Admission: RE | Admit: 2019-09-23 | Discharge: 2019-09-23 | Disposition: A | Discharge status: Home / Self Care | Payer: Medicare Other | Attending: Urology | Admitting: Urology

## 2019-09-23 ENCOUNTER — Encounter: Payer: Self-pay | Admitting: Urology

## 2019-09-23 ENCOUNTER — Other Ambulatory Visit: Payer: Self-pay

## 2019-09-23 VITALS — BP 144/73 | HR 81 | Ht 67.0 in | Wt 184.0 lb

## 2019-09-23 DIAGNOSIS — R109 Unspecified abdominal pain: Secondary | ICD-10-CM | POA: Diagnosis not present

## 2019-09-23 DIAGNOSIS — N952 Postmenopausal atrophic vaginitis: Secondary | ICD-10-CM | POA: Diagnosis not present

## 2019-09-23 DIAGNOSIS — N390 Urinary tract infection, site not specified: Secondary | ICD-10-CM

## 2019-09-23 DIAGNOSIS — R300 Dysuria: Secondary | ICD-10-CM | POA: Diagnosis not present

## 2019-09-23 LAB — BLADDER SCAN AMB NON-IMAGING: Scan Result: 20

## 2019-09-23 MED ORDER — PREMARIN 0.625 MG/GM VA CREA: TOPICAL_CREAM | VAGINAL | 12 refills | Status: DC

## 2019-09-23 MED ORDER — AMOXICILLIN-POT CLAVULANATE 875-125 MG PO TABS: 1.0000 | ORAL_TABLET | Freq: Two times a day (BID) | ORAL | 0 refills | Status: DC

## 2019-09-23 MED ORDER — ESTRADIOL 0.1 MG/GM VA CREA: TOPICAL_CREAM | VAGINAL | 12 refills | Status: DC

## 2019-09-23 NOTE — Progress Notes (Signed)
09/23/2019 11:51 AM   [name] Cooper [birth year] [id number]  Referring provider: [name], MD [address] [facility] [place],  [place] [address]  Chief Complaint  Patient presents with  . Recurrent UTI    HPI: [name] Cooper is a 79 y.o. who is referred to [place] by, Dr. Kary Kos, for recurrent urinary tract infections.  I had seen [name] in 2018 for similar issues.  At that time, she was placed on vaginal estrogen cream, three nights weekly.    Reviewing her records: E. Coli UTI resistant to ampicillin, ciprofloxacin, levofloxacin, tetracycline and trimethoprim/sulfa on 07/15/2019 E. Coli UTI resistant to ampicillin, ciprofloxacin, gentamicin, levofloxacin, tetracycline and trimethoprim/sulfa on 08/10/2019  She does have a history of nephrolithiasis.  She may have had URS to remove the stones.  She is not sexually active.   She is postmenopausal.   She denies constipation and/or diarrhea.   She does engage in good perineal hygiene. She does not take tub baths.   She does not have incontinence.  Today, she is complaining of frequency, urgency, dysuria, difficulty urinary, weak urinary stream, back and lower abdominal pain.  She is wearing pads during the day for urge incontinence.  She states that all this started in April.  Patient denies any modifying or aggravating factors.  Patient denies any gross hematuria and flank pain.  Patient denies any fevers, chills, nausea or vomiting.  Her UA is positive for greater than 30 WBCs and many bacteria.  Her PVR is 20 mL.    KUB with right renal stones and calcified uterine fibroid.   She is no longer applying the vaginal estrogen cream.   PMH: Past Medical History:  Diagnosis Date  . Arthritis    left hip, left ankle  . Cholelithiases   . CKD (chronic kidney disease), stage II   . Coronary artery disease    a. cardiac cath 01/2011: mid LAD stent patent, mild lumenal irreg's, nl EF 55%, no AS/MR  . Fibromyalgia    . Frozen shoulder   . History of kidney stones   . HTN (hypertension)    controlled with medication;   . Hyperlipidemia    a. statin intolerant  . Iron deficiency   . Thrombocytopenia ([place])   . UTI (urinary tract infection)   . Venous stasis     Surgical History: Past Surgical History:  Procedure Laterality Date  . CARDIAC CATHETERIZATION     stents placed in mid LAD  . CERVICAL SPINE SURGERY    . FETAL BLOOD TRANSFUSION  2014  . Frozen Shoulder    . KIDNEY STONE SURGERY    . LEG SURGERY     broke her leg   . LOWER EXTREMITY ANGIOGRAPHY Right 02/17/2019   Procedure: LOWER EXTREMITY ANGIOGRAPHY;  Surgeon: Katha Cabal, MD;  Location: [place] [facility];  Service: Cardiovascular;  Laterality: Right;  . LOWER EXTREMITY ANGIOGRAPHY Left 03/18/2019   Procedure: LOWER EXTREMITY ANGIOGRAPHY;  Surgeon: Katha Cabal, MD;  Location: [place] [facility];  Service: Cardiovascular;  Laterality: Left;    Home Medications:  Allergies as of 09/23/2019      Reactions   Sulfa Antibiotics Anaphylaxis      Medication List       Accurate as of September 23, 2019 11:51 AM. If you have any questions, ask your nurse or doctor.        amoxicillin-clavulanate 875-125 MG tablet Commonly known as: AUGMENTIN Take 1 tablet by mouth every 12 (  twelve) hours. Started by: Zara Council, PA-C   aspirin EC 81 MG tablet Take 1 tablet (81 mg total) by mouth daily.   atorvastatin 20 MG tablet Commonly known as: LIPITOR Take 1 tablet (20 mg total) by mouth daily.   clopidogrel 75 MG tablet Commonly known as: Plavix Take 1 tablet (75 mg total) by mouth daily.   ezetimibe 10 MG tablet Commonly known as: ZETIA Take 1 tablet (10 mg total) by mouth daily.   gabapentin 100 MG capsule Commonly known as: NEURONTIN Take 100 mg by mouth at bedtime.   losartan-hydrochlorothiazide 100-25 MG tablet Commonly known as: HYZAAR TAKE 1 TABLET BY MOUTH DAILY   Premarin vaginal cream Generic  drug: conjugated estrogens Apply 0.67m (pea-sized amount)  just inside the vaginal introitus with a finger-tip on  Monday, Wednesday and Friday nights. Started by: SZara Council PA-C   temazepam 30 MG capsule Commonly known as: RESTORIL Take 30 mg by mouth at bedtime.       Allergies:  Allergies  Allergen Reactions  . Sulfa Antibiotics Anaphylaxis    Family History: Family History  Problem Relation Age of Onset  . Hypertension Mother   . Heart attack Mother   . Heart attack Father   . Heart disease Brother        h/o CABG  . Diabetes Brother   . Kidney disease Brother   . Kidney cancer Neg Hx   . Bladder Cancer Neg Hx     Social History:  reports that she has never smoked. She has never used smokeless tobacco. She reports that she does not drink alcohol and does not use drugs.  ROS: For pertinent review of systems please refer to history of present illness  Physical Exam: BP (!) 144/73   Pulse 81   Ht 5' 7"  (1.702 m)   Wt 184 lb (83.5 kg)   BMI 28.82 kg/m   Constitutional:  Well nourished. Alert and oriented, No acute distress. HEENT: [place] AT, mask in place.  Trachea midline Cardiovascular: No clubbing, cyanosis, or edema. Respiratory: Normal respiratory effort, no increased work of breathing. Neurologic: Grossly intact, no focal deficits, moving all 4 extremities. Psychiatric: Normal mood and affect.   Laboratory Data: Lab Results  Component Value Date   WBC 8.8 08/01/2017   HGB 9.7 (L) 08/01/2017   HCT 29.0 (L) 08/01/2017   MCV 83.9 08/01/2017   PLT 275 08/01/2017    Lab Results  Component Value Date   CREATININE 1.57 (H) 03/18/2019    Lab Results  Component Value Date   AST 26 01/31/2017   Lab Results  Component Value Date   ALT 13 (L) 01/31/2017   Urinalysis Component     Latest Ref Rng & Units 09/23/2019  Specific Gravity, UA     1.005 - 1.030 1.020  pH, UA     5.0 - 7.5 6.0  Color, UA     Yellow Yellow  Appearance Ur     Clear  Cloudy (A)  Leukocytes,UA     Negative 2+ (A)  Protein,UA     Negative/Trace 1+ (A)  Glucose, UA     Negative Negative  Ketones, UA     Negative Negative  RBC, UA     Negative Trace (A)  Bilirubin, UA     Negative Negative  Urobilinogen, Ur     0.2 - 1.0 mg/dL 0.2  Nitrite, UA     Negative Negative  Microscopic Examination      See below:  Component     Latest Ref Rng & Units 09/23/2019          WBC, UA     0 - 5 /hpf >30 (A)  RBC     0 - 2 /hpf 0-2  Epithelial Cells (non renal)     0 - 10 /hpf 0-10  Bacteria, UA     None seen/Few Many (A)    I have reviewed the labs.   Pertinent Imaging: Results for CASON, DABNEY (MRN [id number]) as of 09/23/2019 10:53  Ref. Range 09/23/2019 10:23  Scan Result Unknown 20  CLINICAL DATA:  Left flank pain.  Urinary tract infection.  EXAM: ABDOMEN - 1 VIEW  COMPARISON:  Radiography 09/15/2014.  CT 12/28/2013.  FINDINGS: Bowel gas pattern is unremarkable. Surgical clips in the right upper quadrant consistent with previous cholecystectomy. Small grouping of stones in the midportion of the right kidney, measuring maximally 9 mm. Coarse calcifications in the central pelvis relate to uterine leiomyomas. No sign of ureteral stone. Early vascular calcification is noted. Ordinary degenerative changes affect the spine.  IMPRESSION: Small grouping of stones in the right kidney, maximal measurement 9 mm. No sign of passing stone or bladder stone. Calcifications in the pelvis relate to calcified uterine leiomyomas, based on previous CT data.   Electronically Signed   By: Nelson Chimes M.D.   On: 09/23/2019 16:30 I have independently reviewed the films.  See HPI.   Assessment & Plan:    1. Recurrent UTI's Criteria for recurrent UTI has been met with 2 or more infections in 6 months or 3 or greater infections in one year Schedule RUS to rule out nidus for UTI  2. Vaginal atrophy - I explained to the patient that when  women go through menopause and her estrogen levels are severely diminished, the normal vaginal flora will change.  This is due to an increase of the vaginal canal's pH. Because of this, the vaginal canal may be colonized by bacteria from the rectum instead of the protective lactobacillus.  This accompanied by the loss of the mucus barrier with vaginal atrophy is a cause of recurrent urinary tract infections. - In some studies, the use of vaginal estrogen cream has been demonstrated to reduce  recurrent urinary tract infections to one a year.  - Patient was given a prescription of vaginal estrogen cream (Premarin) and instructed to apply 0.61m (pea-sized amount)  just inside the vaginal introitus with a finger-tip every night for two weeks and then Monday, Wednesday and Friday nights.  I explained to the patient that vaginally administered estrogen, which causes only a slight increase in the blood estrogen levels, have fewer contraindications and adverse systemic effects that oral HT. - She will follow up in three months for an exam.    3.  Nephrolithiasis Right lower pole stones No intervention warranted at this time                                       Return in about 3 weeks (around 10/14/2019) for RUS report, exam and symptom recheck .  These notes generated with voice recognition software. I apologize for typographical errors.  SZara Council PA-C  [facility] [address] [phone]

## 2019-09-24 LAB — URINALYSIS, COMPLETE
Bilirubin, UA: NEGATIVE
Glucose, UA: NEGATIVE
Ketones, UA: NEGATIVE
Nitrite, UA: NEGATIVE
Specific Gravity, UA: 1.02 (ref 1.005–1.030)
Urobilinogen, Ur: 0.2 mg/dL (ref 0.2–1.0)
pH, UA: 6 (ref 5.0–7.5)

## 2019-09-24 LAB — MICROSCOPIC EXAMINATION: WBC, UA: >30 /hpf — AB (ref 0–5)

## 2019-09-26 LAB — CULTURE, URINE COMPREHENSIVE

## 2019-09-29 ENCOUNTER — Telehealth: Payer: Self-pay | Admitting: Family Medicine

## 2019-09-29 DIAGNOSIS — N952 Postmenopausal atrophic vaginitis: Secondary | ICD-10-CM

## 2019-09-29 MED ORDER — ESTRADIOL 0.1 MG/GM VA CREA: TOPICAL_CREAM | VAGINAL | 12 refills | Status: DC

## 2019-09-29 NOTE — Telephone Encounter (Signed)
Patient notified and the Estrace was 180.00 so she was unable to pick up the RX. I informed her of the GoodRX coupon. The Estrace was sent to [facility]. Patient voiced understanding.

## 2019-09-29 NOTE — Telephone Encounter (Signed)
-----   Message from Harle Battiest, PA-C sent at 09/29/2019  6:42 AM EDT ----- Please let Mrs. Sinnett know that her urine culture returned positive for infection and to continue the Augmentin.  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

## 2019-09-29 NOTE — Telephone Encounter (Signed)
-----   Message from Shannon A McGowan, PA-C sent at 09/29/2019  6:42 AM EDT ----- Please let Mrs. Mortimer know that her urine culture returned positive for infection and to continue the Augmentin.  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   

## 2019-10-08 ENCOUNTER — Other Ambulatory Visit: Payer: Self-pay | Admitting: Student

## 2019-10-08 DIAGNOSIS — N644 Mastodynia: Secondary | ICD-10-CM

## 2019-10-14 ENCOUNTER — Ambulatory Visit
Admission: RE | Admit: 2019-10-14 | Discharge: 2019-10-14 | Disposition: A | Discharge status: Home / Self Care | Payer: Medicare Other | Source: Ambulatory Visit | Attending: Student | Admitting: Student

## 2019-10-14 ENCOUNTER — Ambulatory Visit
Admission: RE | Admit: 2019-10-14 | Discharge: 2019-10-14 | Disposition: A | Discharge status: Home / Self Care | Payer: Medicare Other | Source: Ambulatory Visit | Attending: Urology | Admitting: Urology

## 2019-10-14 ENCOUNTER — Other Ambulatory Visit: Payer: Self-pay

## 2019-10-14 DIAGNOSIS — N644 Mastodynia: Secondary | ICD-10-CM | POA: Insufficient documentation

## 2019-10-14 DIAGNOSIS — N281 Cyst of kidney, acquired: Secondary | ICD-10-CM | POA: Diagnosis not present

## 2019-10-14 DIAGNOSIS — R109 Unspecified abdominal pain: Secondary | ICD-10-CM

## 2019-10-14 DIAGNOSIS — N390 Urinary tract infection, site not specified: Secondary | ICD-10-CM

## 2019-10-14 NOTE — Progress Notes (Signed)
10/15/2019 8:23 AM   [name] Cooper [birth year] [id number]  Referring provider: [name], MD [address] [facility] [place],  [place] [address]  Chief Complaint  Patient presents with  . Recurrent UTI    HPI: [name] Cooper is a 79 y.o. female with rUTI's, vaginal atrophy and nephrolithiasis who presents today for follow up.    At her visit on 09/23/3019: Reviewing her records: E. Coli UTI resistant to ampicillin, ciprofloxacin, levofloxacin, tetracycline and trimethoprim/sulfa on 07/15/2019 E. Coli UTI resistant to ampicillin, ciprofloxacin, gentamicin, levofloxacin, tetracycline and trimethoprim/sulfa on 08/10/2019  She does have a history of nephrolithiasis.  She may have had URS to remove the stones.  She is not sexually active.   She is postmenopausal.   She denies constipation and/or diarrhea.  She does engage in good perineal hygiene. She does not take tub baths.   She does not have incontinence.  KUB with right renal stones and calcified uterine fibroid.     RUS 10/14/2019 bilateral renal cysts, but no hydronephrosis or stones  She states that she is experiencing urgency frequency and urge incontinence.  She states she has pain in her suprapubic or vaginal area.  It is difficult to tease out through her history exactly where this pain is located.  She states that emptying her bladder does not relieve the pain.  She states that she does not experience pain prior to urination.  She has not had any vaginal discharge.  Patient denies any modifying or aggravating factors.  Patient denies any gross hematuria, dysuria or suprapubic/flank pain.  Patient denies any fevers, chills, nausea or vomiting.   PMH: Past Medical History:  Diagnosis Date  . Arthritis    left hip, left ankle  . Cholelithiases   . CKD (chronic kidney disease), stage II   . Coronary artery disease    a. cardiac cath 01/2011: mid LAD stent patent, mild lumenal irreg's, nl EF 55%, no AS/MR    . Fibromyalgia   . Frozen shoulder   . History of kidney stones   . HTN (hypertension)    controlled with medication;   . Hyperlipidemia    a. statin intolerant  . Iron deficiency   . Thrombocytopenia ([place])   . UTI (urinary tract infection)   . Venous stasis     Surgical History: Past Surgical History:  Procedure Laterality Date  . CARDIAC CATHETERIZATION     stents placed in mid LAD  . CERVICAL SPINE SURGERY    . FETAL BLOOD TRANSFUSION  2014  . Frozen Shoulder    . KIDNEY STONE SURGERY    . LEG SURGERY     broke her leg   . LOWER EXTREMITY ANGIOGRAPHY Right 02/17/2019   Procedure: LOWER EXTREMITY ANGIOGRAPHY;  Surgeon: Katha Cabal, MD;  Location: [facility];  Service: Cardiovascular;  Laterality: Right;  . LOWER EXTREMITY ANGIOGRAPHY Left 03/18/2019   Procedure: LOWER EXTREMITY ANGIOGRAPHY;  Surgeon: Katha Cabal, MD;  Location: [facility];  Service: Cardiovascular;  Laterality: Left;    Home Medications:  Allergies as of 10/15/2019      Reactions   Sulfa Antibiotics Anaphylaxis      Medication List       Accurate as of October 15, 2019 11:59 PM. If you have any questions, ask your nurse or doctor.        STOP taking these medications   amoxicillin-clavulanate 875-125 MG tablet Commonly known as: AUGMENTIN   ezetimibe 10 MG  tablet Commonly known as: ZETIA     TAKE these medications   aspirin EC 81 MG tablet Take 1 tablet (81 mg total) by mouth daily.   atorvastatin 20 MG tablet Commonly known as: LIPITOR Take 1 tablet (20 mg total) by mouth daily.   clopidogrel 75 MG tablet Commonly known as: Plavix Take 1 tablet (75 mg total) by mouth daily.   estradiol 0.1 MG/GM vaginal cream Commonly known as: ESTRACE Apply a pea size amount Monday, Wednesday, and Friday night   gabapentin 100 MG capsule Commonly known as: NEURONTIN Take 100 mg by mouth at bedtime.   losartan-hydrochlorothiazide 100-25 MG tablet Commonly known  as: HYZAAR TAKE 1 TABLET BY MOUTH DAILY   mirabegron ER 25 MG Tb24 tablet Commonly known as: MYRBETRIQ Take 1 tablet (25 mg total) by mouth daily.   temazepam 30 MG capsule Commonly known as: RESTORIL Take 30 mg by mouth at bedtime.       Allergies:  Allergies  Allergen Reactions  . Sulfa Antibiotics Anaphylaxis    Family History: Family History  Problem Relation Age of Onset  . Hypertension Mother   . Heart attack Mother   . Heart attack Father   . Heart disease Brother        h/o CABG  . Diabetes Brother   . Kidney disease Brother   . Kidney cancer Neg Hx   . Bladder Cancer Neg Hx   . Breast cancer Neg Hx     Social History:  reports that she has never smoked. She has never used smokeless tobacco. She reports that she does not drink alcohol and does not use drugs.  ROS: For pertinent review of systems please refer to history of present illness  Physical Exam: BP (!) 151/76   Pulse 62   Ht 5' 6"  (1.676 m)   Wt 184 lb (83.5 kg)   BMI 29.70 kg/m   Constitutional:  Well nourished. Alert and oriented, No acute distress. HEENT: [place] AT, mask in place.  Trachea midline Cardiovascular: No clubbing, cyanosis, or edema. Respiratory: Normal respiratory effort, no increased work of breathing. GI: Abdomen is soft, non tender, non distended, no abdominal masses. Liver and spleen not palpable.  No hernias appreciated.  Stool sample for occult testing is not indicated.   GU: No CVA tenderness.  No bladder fullness or masses.  Atrophic  external genitalia, sparse pubic hair distribution, no lesions.  Normal urethral meatus, no lesions, no prolapse, no discharge.   No urethral masses, tenderness and/or tenderness. No bladder fullness, tenderness or masses.  Erythematous vagina mucosa, poor estrogen effect, no discharge, no lesions, fair pelvic support, grade II cystocele and no rectocele noted.  No cervical motion tenderness.  Uterus is freely mobile and non-fixed.  No  adnexal/parametria masses or tenderness noted.  Anus and perineum are without rashes or lesions.    Skin: No rashes, bruises or suspicious lesions. Lymph: No inguinal adenopathy. Neurologic: Grossly intact, no focal deficits, moving all 4 extremities. Psychiatric: Normal mood and affect.   Laboratory Data: Lab Results  Component Value Date   WBC 8.8 08/01/2017   HGB 9.7 (L) 08/01/2017   HCT 29.0 (L) 08/01/2017   MCV 83.9 08/01/2017   PLT 275 08/01/2017    Lab Results  Component Value Date   CREATININE 1.57 (H) 03/18/2019    Lab Results  Component Value Date   AST 26 01/31/2017   Lab Results  Component Value Date   ALT 13 (L) 01/31/2017   Urinalysis  Component     Latest Ref Rng & Units 09/23/2019  Specific Gravity, UA     1.005 - 1.030 1.020  pH, UA     5.0 - 7.5 6.0  Color, UA     Yellow Yellow  Appearance Ur     Clear Cloudy (A)  Leukocytes,UA     Negative 2+ (A)  Protein,UA     Negative/Trace 1+ (A)  Glucose, UA     Negative Negative  Ketones, UA     Negative Negative  RBC, UA     Negative Trace (A)  Bilirubin, UA     Negative Negative  Urobilinogen, Ur     0.2 - 1.0 mg/dL 0.2  Nitrite, UA     Negative Negative  Microscopic Examination      See below:   Component     Latest Ref Rng & Units 09/23/2019          WBC, UA     0 - 5 /hpf >30 (A)  RBC     0 - 2 /hpf 0-2  Epithelial Cells (non renal)     0 - 10 /hpf 0-10  Bacteria, UA     None seen/Few Many (A)    I have reviewed the labs.   Pertinent Imaging:  CLINICAL DATA:  Initial evaluation for recurrent UTIs.  EXAM: RENAL / URINARY TRACT ULTRASOUND COMPLETE  COMPARISON:  Prior renal ultrasound from 01/22/2017 as well as prior CT from 12/28/2013.  FINDINGS: Right Kidney:  Renal measurements: 7.7 x 4.0 x 4.0 cm = volume: 64.1 mL. Mild diffuse cortical thinning with increased echogenicity within the renal parenchyma. No nephrolithiasis or hydronephrosis. 2.8 x 2.7 x 3.2 cm  mildly complex cyst with a few internal septations and possible calcification seen at the interpolar region. Appearance is relatively stable from previous. Additional 3.1 x 2.5 x 2.9 cm simple cyst present at the upper pole.  Left Kidney:  Renal measurements: 9.0 x 4.7 x 5.5 cm = volume: 122.2 mL. Mild diffuse cortical thinning with increased echogenicity within the renal parenchyma. No nephrolithiasis or hydronephrosis. 4.7 x 3.8 x 3.7 cm mildly complex cyst with a few internal septations and possible calcification seen at the upper pole. While this is not well seen on prior ultrasound, this is grossly similar as compared to prior CT from 2015. Additional 3.9 x 4.6 x 3.7 cm simple cyst present at the interpolar region.  Bladder:  Appears normal for degree of bladder distention.  Other:  None.  IMPRESSION: 1. No hydronephrosis or other acute finding. 2. Mild renal atrophy with increased echogenicity within the kidneys bilaterally, suggesting chronic medical renal disease. 3. Benign appearing bilateral renal cysts as above, grossly similar to previous studies.   Electronically Signed   By: Jeannine Boga M.D.   On: 10/15/2019 05:17 I have independently reviewed the films.  See HPI.   Assessment & Plan:    1. Urge incontinence I will have the patient try Myrbetriq for 3 weeks to see how this addresses her symptoms.  If it controls or relieves the pain she is speaking of, that is likely the pain she describes is the urge to void.  If it does not address her issues we will schedule her for cystoscopy.  2. Recurrent UTI's Criteria for recurrent UTI has been met with 2 or more infections in 6 months or 3 or greater infections in one year RUS no nidus for UTI  3. Vaginal atrophy Continue the vaginal estrogen cream Monday,  Wednesday and Friday nights She will follow up in three months for an exam.    4.  Nephrolithiasis Right lower pole stones No  intervention warranted at this time                                       Return in about 3 weeks (around 11/05/2019) for PVR and OAB questionnaire.  These notes generated with voice recognition software. I apologize for typographical errors.  Zara Council, PA-C  [facility] [address] [place], [place] [address] [phone]

## 2019-10-15 ENCOUNTER — Ambulatory Visit: Payer: Medicare Other | Admitting: Urology

## 2019-10-15 VITALS — BP 151/76 | HR 62 | Ht 66.0 in | Wt 184.0 lb

## 2019-10-15 DIAGNOSIS — N200 Calculus of kidney: Secondary | ICD-10-CM | POA: Diagnosis not present

## 2019-10-15 DIAGNOSIS — N390 Urinary tract infection, site not specified: Secondary | ICD-10-CM | POA: Diagnosis not present

## 2019-10-15 DIAGNOSIS — N952 Postmenopausal atrophic vaginitis: Secondary | ICD-10-CM

## 2019-10-15 DIAGNOSIS — N3941 Urge incontinence: Secondary | ICD-10-CM

## 2019-10-15 MED ORDER — MIRABEGRON ER 25 MG PO TB24: 25.0000 mg | ORAL_TABLET | Freq: Every day | ORAL | 0 refills | Status: DC

## 2019-10-16 ENCOUNTER — Encounter: Payer: Self-pay | Admitting: Urology

## 2019-11-04 NOTE — Progress Notes (Signed)
11/05/2019 4:02 PM   [name] Cooper 04/29/1940 [id number]  Referring provider: [name], MD [address] [facility] [place],  [place] [address]  Chief Complaint  Patient presents with  . Follow-up    HPI: [name] Cooper is a 79 y.o. female with rUTI's, vaginal atrophy and nephrolithiasis who presents today after a trial of Myrbetriq.    At her visit on 09/23/3019: Reviewing her records: E. Coli UTI resistant to ampicillin, ciprofloxacin, levofloxacin, tetracycline and trimethoprim/sulfa on 07/15/2019 E. Coli UTI resistant to ampicillin, ciprofloxacin, gentamicin, levofloxacin, tetracycline and trimethoprim/sulfa on 08/10/2019  She does have a history of nephrolithiasis.  She may have had URS to remove the stones.  She is not sexually active.   She is postmenopausal.   She denies constipation and/or diarrhea.  She does engage in good perineal hygiene. She does not take tub baths.   She does not have incontinence.  KUB with right renal stones and calcified uterine fibroid.     RUS 10/14/2019 bilateral renal cysts, but no hydronephrosis or stones  The patient is  experiencing urgency x 4-7, frequency x 4-7, not restricting fluids to avoid visits to the restroom, is engaging in toilet mapping, incontinence x 8 or more and nocturia x 0-3.   Her BP is 157/69.   Her PVR is 121 mL.   She did not feel the Myrbetriq samples made any difference.    She continues to experience frequency, dysuria, suprapubic pain and LBP.  Patient denies any modifying or aggravating factors.  Patient denies any gross hematuria or flank pain.  Patient denies any fevers, chills, nausea or vomiting.  UA nitrite negative, > 30 WBC's and many bacteria.    PMH: Past Medical History:  Diagnosis Date  . Arthritis    left hip, left ankle  . Cholelithiases   . CKD (chronic kidney disease), stage II   . Coronary artery disease    a. cardiac cath 01/2011: mid LAD stent patent, mild lumenal irreg's,  nl EF 55%, no AS/MR  . Fibromyalgia   . Frozen shoulder   . History of kidney stones   . HTN (hypertension)    controlled with medication;   . Hyperlipidemia    a. statin intolerant  . Iron deficiency   . Thrombocytopenia ([place])   . UTI (urinary tract infection)   . Venous stasis     Surgical History: Past Surgical History:  Procedure Laterality Date  . CARDIAC CATHETERIZATION     stents placed in mid LAD  . CERVICAL SPINE SURGERY    . FETAL BLOOD TRANSFUSION  2014  . Frozen Shoulder    . KIDNEY STONE SURGERY    . LEG SURGERY     broke her leg   . LOWER EXTREMITY ANGIOGRAPHY Right 02/17/2019   Procedure: LOWER EXTREMITY ANGIOGRAPHY;  Surgeon: Katha Cabal, MD;  Location: [facility];  Service: Cardiovascular;  Laterality: Right;  . LOWER EXTREMITY ANGIOGRAPHY Left 03/18/2019   Procedure: LOWER EXTREMITY ANGIOGRAPHY;  Surgeon: Katha Cabal, MD;  Location: [facility];  Service: Cardiovascular;  Laterality: Left;    Home Medications:  Allergies as of 11/05/2019      Reactions   Sulfa Antibiotics Anaphylaxis      Medication List       Accurate as of November 05, 2019 11:59 PM. If you have any questions, ask your nurse or doctor.        aspirin EC 81 MG tablet Take 1 tablet (  81 mg total) by mouth daily.   atorvastatin 20 MG tablet Commonly known as: LIPITOR Take 1 tablet (20 mg total) by mouth daily.   clopidogrel 75 MG tablet Commonly known as: Plavix Take 1 tablet (75 mg total) by mouth daily.   estradiol 0.1 MG/GM vaginal cream Commonly known as: ESTRACE Apply a pea size amount Monday, Wednesday, and Friday night   gabapentin 100 MG capsule Commonly known as: NEURONTIN Take 100 mg by mouth at bedtime.   losartan-hydrochlorothiazide 100-25 MG tablet Commonly known as: HYZAAR TAKE 1 TABLET BY MOUTH DAILY   mirabegron ER 25 MG Tb24 tablet Commonly known as: MYRBETRIQ Take 1 tablet (25 mg total) by mouth daily.   temazepam 30  MG capsule Commonly known as: RESTORIL Take 30 mg by mouth at bedtime.   Uribel 118 MG Caps Take 1 capsule (118 mg total) by mouth 4 (four) times daily as needed. Started by: Zara Council, PA-C       Allergies:  Allergies  Allergen Reactions  . Sulfa Antibiotics Anaphylaxis    Family History: Family History  Problem Relation Age of Onset  . Hypertension Mother   . Heart attack Mother   . Heart attack Father   . Heart disease Brother        h/o CABG  . Diabetes Brother   . Kidney disease Brother   . Kidney cancer Neg Hx   . Bladder Cancer Neg Hx   . Breast cancer Neg Hx     Social History:  reports that she has never smoked. She has never used smokeless tobacco. She reports that she does not drink alcohol and does not use drugs.  ROS: For pertinent review of systems please refer to history of present illness  Physical Exam: BP (!) 157/69   Pulse 64   Ht _0  (1.702 m)   Wt 182 lb (82.6 kg)   BMI 28.51 kg/m   Constitutional:  Well nourished. Alert and oriented, No acute distress. HEENT: [place] AT, mask in place.  Trachea midline Cardiovascular: No clubbing, cyanosis, or edema. Respiratory: Normal respiratory effort, no increased work of breathing. Neurologic: Grossly intact, no focal deficits, moving all 4 extremities. Psychiatric: Normal mood and affect.   Laboratory Data: Lab Results  Component Value Date   WBC 8.8 08/01/2017   HGB 9.7 (L) 08/01/2017   HCT 29.0 (L) 08/01/2017   MCV 83.9 08/01/2017   PLT 275 08/01/2017    Lab Results  Component Value Date   CREATININE 1.57 (H) 03/18/2019    Lab Results  Component Value Date   AST 26 01/31/2017   Lab Results  Component Value Date   ALT 13 (L) 01/31/2017   Urinalysis Component     Latest Ref Rng & Units 11/05/2019  Specific Gravity, UA     1.005 - 1.030 1.020  pH, UA     5.0 - 7.5 5.5  Color, UA     Yellow Yellow  Appearance Ur     Clear Cloudy (A)  Leukocytes,UA     Negative 2+ (A)    Protein,UA     Negative/Trace Negative  Glucose, UA     Negative Negative  Ketones, UA     Negative Negative  RBC, UA     Negative Trace (A)  Bilirubin, UA     Negative Negative  Urobilinogen, Ur     0.2 - 1.0 mg/dL 0.2  Nitrite, UA     Negative Negative  Microscopic Examination  See below:   Component     Latest Ref Rng & Units 11/05/2019  WBC, UA     0 - 5 /hpf >30 (H)  RBC     0 - 2 /hpf None seen  Epithelial Cells (non renal)     0 - 10 /hpf 0-10  Bacteria, UA     None seen/Few Many (A)    I have reviewed the labs.   Pertinent Imaging:  CLINICAL DATA:  Initial evaluation for recurrent UTIs.  EXAM: RENAL / URINARY TRACT ULTRASOUND COMPLETE  COMPARISON:  Prior renal ultrasound from 01/22/2017 as well as prior CT from 12/28/2013.  FINDINGS: Right Kidney:  Renal measurements: 7.7 x 4.0 x 4.0 cm = volume: 64.1 mL. Mild diffuse cortical thinning with increased echogenicity within the renal parenchyma. No nephrolithiasis or hydronephrosis. 2.8 x 2.7 x 3.2 cm mildly complex cyst with a few internal septations and possible calcification seen at the interpolar region. Appearance is relatively stable from previous. Additional 3.1 x 2.5 x 2.9 cm simple cyst present at the upper pole.  Left Kidney:  Renal measurements: 9.0 x 4.7 x 5.5 cm = volume: 122.2 mL. Mild diffuse cortical thinning with increased echogenicity within the renal parenchyma. No nephrolithiasis or hydronephrosis. 4.7 x 3.8 x 3.7 cm mildly complex cyst with a few internal septations and possible calcification seen at the upper pole. While this is not well seen on prior ultrasound, this is grossly similar as compared to prior CT from 2015. Additional 3.9 x 4.6 x 3.7 cm simple cyst present at the interpolar region.  Bladder:  Appears normal for degree of bladder distention.  Other:  None.  IMPRESSION: 1. No hydronephrosis or other acute finding. 2. Mild renal atrophy with  increased echogenicity within the kidneys bilaterally, suggesting chronic medical renal disease. 3. Benign appearing bilateral renal cysts as above, grossly similar to previous studies.   Electronically Signed   By: Jeannine Boga M.D.   On: 10/15/2019 05:17   Assessment & Plan:    1. Urge incontinence No change with Myrbetriq UA sent for culture Prescribed Uribel for discomfort  Will schedule for cystoscopy I have explained to the patient that they will  be scheduled for a cystoscopy in our office to evaluate their bladder.  The cystoscopy consists of passing a tube with a lens up through their urethra and into their urinary bladder.   We will inject the urethra with a lidocaine gel prior to introducing the cystoscope to help with any discomfort during the procedure.   After the procedure, they might experience blood in the urine and discomfort with urination.  This will abate after the first few voids.  I have  encouraged the patient to increase water intake  during this time.  Patient denies any allergies to lidocaine.    2. Recurrent UTI's  Criteria for recurrent UTI has been met with 2 or more infections in 6 months or 3 or greater infections in one year RUS no nidus for UTI Will schedule cystoscopy  3. Vaginal atrophy Continue the vaginal estrogen cream Monday, Wednesday and Friday nights She will follow up in three months for an exam.    4.  Nephrolithiasis Right lower pole stones No intervention warranted at this time                                       Return for cystoscopy for  rUTI's/refractory urgency .  These notes generated with voice recognition software. I apologize for typographical errors.  Zara Council, PA-C  [facility] [address] [place],  [address] [phone]

## 2019-11-05 ENCOUNTER — Ambulatory Visit (INDEPENDENT_AMBULATORY_CARE_PROVIDER_SITE_OTHER): Payer: Medicare Other | Admitting: Urology

## 2019-11-05 ENCOUNTER — Encounter: Payer: Self-pay | Admitting: Urology

## 2019-11-05 ENCOUNTER — Other Ambulatory Visit: Payer: Self-pay

## 2019-11-05 VITALS — BP 157/69 | HR 64 | Ht 67.0 in | Wt 182.0 lb

## 2019-11-05 DIAGNOSIS — N952 Postmenopausal atrophic vaginitis: Secondary | ICD-10-CM | POA: Diagnosis not present

## 2019-11-05 DIAGNOSIS — N3941 Urge incontinence: Secondary | ICD-10-CM | POA: Diagnosis not present

## 2019-11-05 DIAGNOSIS — N390 Urinary tract infection, site not specified: Secondary | ICD-10-CM

## 2019-11-05 LAB — BLADDER SCAN AMB NON-IMAGING: Scan Result: 121

## 2019-11-05 MED ORDER — URIBEL 118 MG PO CAPS: 1.0000 | ORAL_CAPSULE | Freq: Four times a day (QID) | ORAL | 1 refills | Status: DC | PRN

## 2019-11-05 NOTE — Patient Instructions (Signed)
Cystoscopy Cystoscopy is a procedure that is used to help diagnose and sometimes treat conditions that affect the lower urinary tract. The lower urinary tract includes the bladder and the urethra. The urethra is the tube that drains urine from the bladder. Cystoscopy is done using a thin, tube-shaped instrument with a light and camera at the end (cystoscope). The cystoscope may be hard or flexible, depending on the goal of the procedure. The cystoscope is inserted through the urethra, into the bladder. Cystoscopy may be recommended if you have:  Urinary tract infections that keep coming back.  Blood in the urine (hematuria).  An inability to control when you urinate (urinary incontinence) or an overactive bladder.  Unusual cells found in a urine sample.  A blockage in the urethra, such as a urinary stone.  Painful urination.  An abnormality in the bladder found during an intravenous pyelogram (IVP) or CT scan. Cystoscopy may also be done to remove a sample of tissue to be examined under a microscope (biopsy). What are the risks? Generally, this is a safe procedure. However, problems may occur, including:  Infection.  Bleeding.  What happens during the procedure?  1. You will be given one or more of the following: ? A medicine to numb the area (local anesthetic). 2. The area around the opening of your urethra will be cleaned. 3. The cystoscope will be passed through your urethra into your bladder. 4. Germ-free (sterile) fluid will flow through the cystoscope to fill your bladder. The fluid will stretch your bladder so that your health care provider can clearly examine your bladder walls. 5. Your doctor will look at the urethra and bladder. 6. The cystoscope will be removed The procedure may vary among health care providers  What can I expect after the procedure? After the procedure, it is common to have: 1. Some soreness or pain in your abdomen and urethra. 2. Urinary symptoms.  These include: ? Mild pain or burning when you urinate. Pain should stop within a few minutes after you urinate. This may last for up to 1 week. ? A small amount of blood in your urine for several days. ? Feeling like you need to urinate but producing only a small amount of urine. Follow these instructions at home: General instructions  Return to your normal activities as told by your health care provider.   Do not drive for 24 hours if you were given a sedative during your procedure.  Watch for any blood in your urine. If the amount of blood in your urine increases, call your health care provider.  If a tissue sample was removed for testing (biopsy) during your procedure, it is up to you to get your test results. Ask your health care provider, or the department that is doing the test, when your results will be ready.  Drink enough fluid to keep your urine pale yellow.  Keep all follow-up visits as told by your health care provider. This is important. Contact a health care provider if you:  Have pain that gets worse or does not get better with medicine, especially pain when you urinate.  Have trouble urinating.  Have more blood in your urine. Get help right away if you:  Have blood clots in your urine.  Have abdominal pain.  Have a fever or chills.  Are unable to urinate. Summary  Cystoscopy is a procedure that is used to help diagnose and sometimes treat conditions that affect the lower urinary tract.  Cystoscopy is done using   a thin, tube-shaped instrument with a light and camera at the end.  After the procedure, it is common to have some soreness or pain in your abdomen and urethra.  Watch for any blood in your urine. If the amount of blood in your urine increases, call your health care provider.  If you were prescribed an antibiotic medicine, take it as told by your health care provider. Do not stop taking the antibiotic even if you start to feel better. This  information is not intended to replace advice given to you by your health care provider. Make sure you discuss any questions you have with your health care provider. Document Revised: 03/04/2018 Document Reviewed: 03/04/2018 Elsevier Patient Education  2020 [employer]   

## 2019-11-06 LAB — URINALYSIS, COMPLETE
Bilirubin, UA: NEGATIVE
Glucose, UA: NEGATIVE
Ketones, UA: NEGATIVE
Nitrite, UA: NEGATIVE
Protein,UA: NEGATIVE
Specific Gravity, UA: 1.02 (ref 1.005–1.030)
Urobilinogen, Ur: 0.2 mg/dL (ref 0.2–1.0)
pH, UA: 5.5 (ref 5.0–7.5)

## 2019-11-06 LAB — MICROSCOPIC EXAMINATION
RBC, Urine: NONE SEEN /hpf (ref 0–2)
WBC, UA: >30 /hpf — ABNORMAL HIGH (ref 0–5)

## 2019-11-09 ENCOUNTER — Telehealth: Payer: Self-pay | Admitting: Urology

## 2019-11-09 ENCOUNTER — Other Ambulatory Visit: Payer: Self-pay | Admitting: Urology

## 2019-11-09 ENCOUNTER — Telehealth: Payer: Self-pay | Admitting: Family Medicine

## 2019-11-09 LAB — CULTURE, URINE COMPREHENSIVE

## 2019-11-09 MED ORDER — NITROFURANTOIN MONOHYD MACRO 100 MG PO CAPS
100.0000 mg | ORAL_CAPSULE | Freq: Two times a day (BID) | ORAL | 0 refills | Status: DC
Start: 2019-11-09 — End: 2019-12-11

## 2019-11-09 NOTE — Telephone Encounter (Signed)
Patient notified and will pick up ABX from pharmacy.

## 2019-11-09 NOTE — Telephone Encounter (Signed)
-----   Message from Harle Battiest, PA-C sent at 11/09/2019  8:04 AM EDT ----- Please let Mrs. Damman that her urine culture was positive and we need her to start Macrobid 100 mg, BID x 7 days.

## 2019-11-09 NOTE — Telephone Encounter (Signed)
Pt was instructed not to take Uribel while taking 7 days of Macrobid for UTI.  Dr. Richardo Hanks said pt didn't need cysto today.  Follow up was made for a little over a week after pt finished abx.

## 2019-11-17 NOTE — Progress Notes (Signed)
11/18/2019 4:43 PM   [name] Cooper 09/10/1940 [id number]  Referring provider: [name], MD [address] [facility] [place],  [place] [address]  Chief Complaint  Patient presents with  . Recurrent UTI    HPI: [name] Cooper is a 79 y.o. female with rUTI's, vaginal atrophy and nephrolithiasis who presents today for an UTI.    At her visit on 09/23/3019: Reviewing her records: E. Coli UTI resistant to ampicillin, ciprofloxacin, levofloxacin, tetracycline and trimethoprim/sulfa on 07/15/2019 E. Coli UTI resistant to ampicillin, ciprofloxacin, gentamicin, levofloxacin, tetracycline and trimethoprim/sulfa on 08/10/2019 E. Coli UTI resistant to ampicillin, ciprofloxacin, gentamicin and levofloxacin on 11/05/2019  She does have a history of nephrolithiasis.  She may have had URS to remove the stones.  She is not sexually active.   She is postmenopausal.   She denies constipation and/or diarrhea.  She does engage in good perineal hygiene. She does not take tub baths.   She does not have incontinence.  KUB with right renal stones and calcified uterine fibroid.     RUS 10/14/2019 bilateral renal cysts, but no hydronephrosis or stones  CATH UA - not enough output for both UA and culture - so specimen was just sent for culture   She states her symptoms abated on the antibiotic, but she is fearful that they will return.  She is still having urinary urgency and lower back pain.  Patient denies any modifying or aggravating factors.  Patient denies any gross hematuria, dysuria or suprapubic/flank pain.  Patient denies any fevers, chills, nausea or vomiting.   PMH: Past Medical History:  Diagnosis Date  . Arthritis    left hip, left ankle  . Cholelithiases   . CKD (chronic kidney disease), stage II   . Coronary artery disease    a. cardiac cath 01/2011: mid LAD stent patent, mild lumenal irreg's, nl EF 55%, no AS/MR  . Fibromyalgia   . Frozen shoulder   . History of  kidney stones   . HTN (hypertension)    controlled with medication;   . Hyperlipidemia    a. statin intolerant  . Iron deficiency   . Thrombocytopenia ([place])   . UTI (urinary tract infection)   . Venous stasis     Surgical History: Past Surgical History:  Procedure Laterality Date  . CARDIAC CATHETERIZATION     stents placed in mid LAD  . CERVICAL SPINE SURGERY    . FETAL BLOOD TRANSFUSION  2014  . Frozen Shoulder    . KIDNEY STONE SURGERY    . LEG SURGERY     broke her leg   . LOWER EXTREMITY ANGIOGRAPHY Right 02/17/2019   Procedure: LOWER EXTREMITY ANGIOGRAPHY;  Surgeon: Katha Cabal, MD;  Location: [facility];  Service: Cardiovascular;  Laterality: Right;  . LOWER EXTREMITY ANGIOGRAPHY Left 03/18/2019   Procedure: LOWER EXTREMITY ANGIOGRAPHY;  Surgeon: Katha Cabal, MD;  Location: [facility];  Service: Cardiovascular;  Laterality: Left;    Home Medications:  Allergies as of 11/18/2019      Reactions   Sulfa Antibiotics Anaphylaxis      Medication List       Accurate as of November 18, 2019 11:59 PM. If you have any questions, ask your nurse or doctor.        aspirin EC 81 MG tablet Take 1 tablet (81 mg total) by mouth daily.   atorvastatin 20 MG tablet Commonly known as: LIPITOR Take 1 tablet (20 mg total) by  mouth daily.   clopidogrel 75 MG tablet Commonly known as: Plavix Take 1 tablet (75 mg total) by mouth daily.   estradiol 0.1 MG/GM vaginal cream Commonly known as: ESTRACE Apply a pea size amount Monday, Wednesday, and Friday night   gabapentin 100 MG capsule Commonly known as: NEURONTIN Take 100 mg by mouth at bedtime.   losartan-hydrochlorothiazide 100-25 MG tablet Commonly known as: HYZAAR TAKE 1 TABLET BY MOUTH DAILY   mirabegron ER 25 MG Tb24 tablet Commonly known as: MYRBETRIQ Take 1 tablet (25 mg total) by mouth daily.   nitrofurantoin (macrocrystal-monohydrate) 100 MG capsule Commonly known as:  MACROBID Take 1 capsule (100 mg total) by mouth every 12 (twelve) hours.   temazepam 30 MG capsule Commonly known as: RESTORIL Take 30 mg by mouth at bedtime.   Uribel 118 MG Caps Take 1 capsule (118 mg total) by mouth 4 (four) times daily as needed.       Allergies:  Allergies  Allergen Reactions  . Sulfa Antibiotics Anaphylaxis    Family History: Family History  Problem Relation Age of Onset  . Hypertension Mother   . Heart attack Mother   . Heart attack Father   . Heart disease Brother        h/o CABG  . Diabetes Brother   . Kidney disease Brother   . Kidney cancer Neg Hx   . Bladder Cancer Neg Hx   . Breast cancer Neg Hx     Social History:  reports that she has never smoked. She has never used smokeless tobacco. She reports that she does not drink alcohol and does not use drugs.  ROS: For pertinent review of systems please refer to history of present illness  Physical Exam: BP (!) 154/74 (BP Location: Left Arm, Patient Position: Sitting, Cuff Size: Normal)   Pulse 64   Ht 5' 7" (1.702 m)   Wt 184 lb 11.2 oz (83.8 kg)   BMI 28.93 kg/m   Constitutional:  Well nourished. Alert and oriented, No acute distress. HEENT: [place] AT, mask in place.  Trachea midline Cardiovascular: No clubbing, cyanosis, or edema. Respiratory: Normal respiratory effort, no increased work of breathing. Neurologic: Grossly intact, no focal deficits, moving all 4 extremities. Psychiatric: Normal mood and affect.   Laboratory Data: Lab Results  Component Value Date   WBC 8.8 08/01/2017   HGB 9.7 (L) 08/01/2017   HCT 29.0 (L) 08/01/2017   MCV 83.9 08/01/2017   PLT 275 08/01/2017    Lab Results  Component Value Date   CREATININE 1.57 (H) 03/18/2019    Lab Results  Component Value Date   AST 26 01/31/2017   Lab Results  Component Value Date   ALT 13 (L) 01/31/2017   Urinalysis No enough specimen to complete I have reviewed the labs.   Pertinent Imaging:  CLINICAL DATA:   Initial evaluation for recurrent UTIs.  EXAM: RENAL / URINARY TRACT ULTRASOUND COMPLETE  COMPARISON:  Prior renal ultrasound from 01/22/2017 as well as prior CT from 12/28/2013.  FINDINGS: Right Kidney:  Renal measurements: 7.7 x 4.0 x 4.0 cm = volume: 64.1 mL. Mild diffuse cortical thinning with increased echogenicity within the renal parenchyma. No nephrolithiasis or hydronephrosis. 2.8 x 2.7 x 3.2 cm mildly complex cyst with a few internal septations and possible calcification seen at the interpolar region. Appearance is relatively stable from previous. Additional 3.1 x 2.5 x 2.9 cm simple cyst present at the upper pole.  Left Kidney:  Renal measurements: 9.0 x 4.7  x 5.5 cm = volume: 122.2 mL. Mild diffuse cortical thinning with increased echogenicity within the renal parenchyma. No nephrolithiasis or hydronephrosis. 4.7 x 3.8 x 3.7 cm mildly complex cyst with a few internal septations and possible calcification seen at the upper pole. While this is not well seen on prior ultrasound, this is grossly similar as compared to prior CT from 2015. Additional 3.9 x 4.6 x 3.7 cm simple cyst present at the interpolar region.  Bladder:  Appears normal for degree of bladder distention.  Other:  None.  IMPRESSION: 1. No hydronephrosis or other acute finding. 2. Mild renal atrophy with increased echogenicity within the kidneys bilaterally, suggesting chronic medical renal disease. 3. Benign appearing bilateral renal cysts as above, grossly similar to previous studies.   Electronically Signed   By: Jeannine Boga M.D.   On: 10/15/2019 05:17  In and Out Catheterization Patient is present today for a I & O catheterization due to rUTI's. Patient was cleaned and prepped in a sterile fashion with betadine . A 14 FR cath was inserted no complications were noted , 10 ml of urine return was noted, urine was yellow clear in color. A clean urine sample was collected  for culture. Bladder was drained  And catheter was removed with out difficulty.    Performed by: Zara Council, PA-C  Assessment & Plan:    1. Recurrent UTI's  Criteria for recurrent UTI has been met with 2 or more infections in 6 months or 3 or greater infections in one year RUS no nidus for UTI CATH UA sent for culture - will wait on culture results prior to prescribing an antibiotic  2. Urge incontinence Failed Myrbetriq  ? rUTI's contributing to urge incontinence Will reassess once urine culture results have returned - if urine culture returns positive we will treat-if urine culture negative we will have a trial of Gemtesa 75 mg daily   3. Vaginal atrophy Continue the vaginal estrogen cream Monday, Wednesday and Friday nights She will follow up in three months for an exam.    4.  Nephrolithiasis Right lower pole stones No intervention warranted at this time                                       Return for pending urine culture results .  These notes generated with voice recognition software. I apologize for typographical errors.  Zara Council, PA-C  [facility] [address] [place], [place] [address] [phone]

## 2019-11-18 ENCOUNTER — Ambulatory Visit: Payer: Medicare Other | Admitting: Urology

## 2019-11-18 ENCOUNTER — Other Ambulatory Visit: Payer: Self-pay

## 2019-11-18 ENCOUNTER — Encounter: Payer: Self-pay | Admitting: Urology

## 2019-11-18 ENCOUNTER — Other Ambulatory Visit: Payer: Self-pay | Admitting: Urology

## 2019-11-18 VITALS — BP 154/74 | HR 64 | Ht 67.0 in | Wt 184.7 lb

## 2019-11-18 DIAGNOSIS — N3941 Urge incontinence: Secondary | ICD-10-CM | POA: Diagnosis not present

## 2019-11-18 DIAGNOSIS — N390 Urinary tract infection, site not specified: Secondary | ICD-10-CM

## 2019-11-18 NOTE — Patient Instructions (Signed)
Please call Monday or Tuesday for your urine culture results

## 2019-11-21 LAB — CULTURE, URINE COMPREHENSIVE

## 2019-11-24 ENCOUNTER — Telehealth: Payer: Self-pay

## 2019-11-24 DIAGNOSIS — B9620 Unspecified Escherichia coli [E. coli] as the cause of diseases classified elsewhere: Secondary | ICD-10-CM

## 2019-11-24 MED ORDER — AMOXICILLIN-POT CLAVULANATE 875-125 MG PO TABS: 1.0000 | ORAL_TABLET | Freq: Two times a day (BID) | ORAL | 0 refills | Status: AC

## 2019-11-24 NOTE — Telephone Encounter (Signed)
Incoming call from pt's daughter Sheralyn Boatman (listed on DPR) who states she has concerns regarding why her mother continued to get recurrent uti's. Daughter requests a call from a provider to discuss this. Daughter would also like to be called at the time of her mother's appts on speaker to assist as she lives in [place] and is unable to come to appointments in person.

## 2019-11-24 NOTE — Telephone Encounter (Signed)
Incoming call from pt inquiring about urine culture results. Pt states that she is still having dysuria. Spoke with Carman Ching, who reviewed urine culture and gave verbal instructions to send in rx for Augmentin BID x 7 days. Pharmacy confirmed w/ pt rx sent in.

## 2019-12-01 NOTE — Telephone Encounter (Signed)
I would like [name] Cooper to come in for an appointment as I wanted her to have a CATH UA to further evaluate her rUTI's, but she was given a cup instead.  We can also have her daughter, Sheralyn Boatman, on speaker during the visit.

## 2019-12-02 NOTE — Telephone Encounter (Signed)
Spoke to patients daughter and appointment has been scheduled.

## 2019-12-04 ENCOUNTER — Ambulatory Visit: Payer: Medicare Other | Admitting: Physician Assistant

## 2019-12-04 ENCOUNTER — Telehealth: Payer: Self-pay

## 2019-12-04 ENCOUNTER — Other Ambulatory Visit: Payer: Self-pay

## 2019-12-04 ENCOUNTER — Encounter: Payer: Self-pay | Admitting: Physician Assistant

## 2019-12-04 VITALS — BP 173/79 | HR 72 | Ht 67.0 in | Wt 184.0 lb

## 2019-12-04 DIAGNOSIS — R300 Dysuria: Secondary | ICD-10-CM | POA: Diagnosis not present

## 2019-12-04 LAB — URINALYSIS, COMPLETE
Bilirubin, UA: NEGATIVE
Glucose, UA: NEGATIVE
Ketones, UA: NEGATIVE
Nitrite, UA: NEGATIVE
Specific Gravity, UA: 1.02 (ref 1.005–1.030)
Urobilinogen, Ur: 0.2 mg/dL (ref 0.2–1.0)
pH, UA: 6 (ref 5.0–7.5)

## 2019-12-04 LAB — MICROSCOPIC EXAMINATION: WBC, UA: >30 /hpf — AB (ref 0–5)

## 2019-12-04 MED ORDER — CEFUROXIME AXETIL 250 MG PO TABS: 250.0000 mg | ORAL_TABLET | Freq: Two times a day (BID) | ORAL | 0 refills | Status: AC

## 2019-12-04 NOTE — Telephone Encounter (Signed)
Incoming voicemail on triage line from patient's daughter stating that the patient is having severe dysuria and would ile an antibiotic sent in for relief. Returned daughters call and informed her that we would need to see patient in office for urine sample before any antibiotics can be sent in. Called pt, no answer. Left message for pt advising her to call office back to be seen for uti sx.

## 2019-12-04 NOTE — Patient Instructions (Signed)
Recurrent UTI Prevention Strategies  1. Start taking an over-the-counter cranberry supplement for urinary tract health. Take this once or twice daily on an empty stomach, e.g. right before bed. 2. Start taking an over-the-counter probiotic, preferably containing lactobacillus. Take this daily. 3. Continue vaginal estrogen cream. Apply a pea-sized amount around the urethra three times weekly.

## 2019-12-04 NOTE — Progress Notes (Signed)
12/04/2019 4:10 PM   [name] Cooper 1940-07-25 [id number]  CC: Chief Complaint  Patient presents with  . Dysuria    HPI: [name] Cooper is a 79 y.o. female with PMH recurrent UTI, vaginal atrophy on topical vaginal estrogen cream, and nephrolithiasis who presents today for evaluation of possible UTI.  She was seen most recently in clinic by Michiel Cowboy on 11/18/2019 for the same.  Urine culture ultimately grew ampicillin, ciprofloxacin, levofloxacin, tetracycline, and Bactrim resistant E. Coli; she was treated with Augmentin twice daily x7 days.  Today she reports resolution of her dysuria and urgency on Augmentin.  3 days later, her symptoms returned.  She denies obstipation, pneumaturia, fecaluria, abdominal surgery, hysterectomy, abdominal or pelvic radiation, or abdominal or pelvic cancers.  She is discouraged today by her frequency of infection.  She has been using topical vaginal estrogen cream 3 times weekly.  She is scheduled for a follow-up visit with symptom recheck with Carollee Herter next week.  In-office catheterized UA today positive for 1+ blood, 1+ protein, and 1+ leukocyte esterase; urine microscopy with >30 WBCs/HPF, 3-10 RBCs/HPF, and many bacteria.  PMH: Past Medical History:  Diagnosis Date  . Arthritis    left hip, left ankle  . Cholelithiases   . CKD (chronic kidney disease), stage II   . Coronary artery disease    a. cardiac cath 01/2011: mid LAD stent patent, mild lumenal irreg's, nl EF 55%, no AS/MR  . Fibromyalgia   . Frozen shoulder   . History of kidney stones   . HTN (hypertension)    controlled with medication;   . Hyperlipidemia    a. statin intolerant  . Iron deficiency   . Thrombocytopenia ([facility])   . UTI (urinary tract infection)   . Venous stasis     Surgical History: Past Surgical History:  Procedure Laterality Date  . CARDIAC CATHETERIZATION     stents placed in mid LAD  . CERVICAL SPINE SURGERY    . FETAL BLOOD TRANSFUSION  2014    . Frozen Shoulder    . KIDNEY STONE SURGERY    . LEG SURGERY     broke her leg   . LOWER EXTREMITY ANGIOGRAPHY Right 02/17/2019   Procedure: LOWER EXTREMITY ANGIOGRAPHY;  Surgeon: Renford Dills, MD;  Location: [facility];  Service: Cardiovascular;  Laterality: Right;  . LOWER EXTREMITY ANGIOGRAPHY Left 03/18/2019   Procedure: LOWER EXTREMITY ANGIOGRAPHY;  Surgeon: Renford Dills, MD;  Location: [facility];  Service: Cardiovascular;  Laterality: Left;    Home Medications:  Allergies as of 12/04/2019      Reactions   Sulfa Antibiotics Anaphylaxis      Medication List       Accurate as of December 04, 2019  4:10 PM. If you have any questions, ask your nurse or doctor.        aspirin EC 81 MG tablet Take 1 tablet (81 mg total) by mouth daily.   atorvastatin 20 MG tablet Commonly known as: LIPITOR Take 1 tablet (20 mg total) by mouth daily.   cefUROXime 250 MG tablet Commonly known as: CEFTIN Take 1 tablet (250 mg total) by mouth 2 (two) times daily with a meal for 7 days. Started by: Carman Ching, PA-C   clopidogrel 75 MG tablet Commonly known as: Plavix Take 1 tablet (75 mg total) by mouth daily.   estradiol 0.1 MG/GM vaginal cream Commonly known as: ESTRACE Apply a pea size amount Monday, Wednesday, and Friday night  gabapentin 100 MG capsule Commonly known as: NEURONTIN Take 100 mg by mouth at bedtime.   losartan-hydrochlorothiazide 100-25 MG tablet Commonly known as: HYZAAR TAKE 1 TABLET BY MOUTH DAILY   mirabegron ER 25 MG Tb24 tablet Commonly known as: MYRBETRIQ Take 1 tablet (25 mg total) by mouth daily.   nitrofurantoin (macrocrystal-monohydrate) 100 MG capsule Commonly known as: MACROBID Take 1 capsule (100 mg total) by mouth every 12 (twelve) hours.   temazepam 30 MG capsule Commonly known as: RESTORIL Take 30 mg by mouth at bedtime.   Uribel 118 MG Caps Take 1 capsule (118 mg total) by mouth 4 (four) times  daily as needed.       Allergies:  Allergies  Allergen Reactions  . Sulfa Antibiotics Anaphylaxis    Family History: Family History  Problem Relation Age of Onset  . Hypertension Mother   . Heart attack Mother   . Heart attack Father   . Heart disease Brother        h/o CABG  . Diabetes Brother   . Kidney disease Brother   . Kidney cancer Neg Hx   . Bladder Cancer Neg Hx   . Breast cancer Neg Hx     Social History:   reports that she has never smoked. She has never used smokeless tobacco. She reports that she does not drink alcohol and does not use drugs.  Physical Exam: BP (!) 173/79   Pulse 72   Ht 5\' 7"  (1.702 m)   Wt 184 lb (83.5 kg)   BMI 28.82 kg/m   Constitutional:  Alert and oriented, no acute distress, nontoxic appearing HEENT: [place], AT Cardiovascular: No clubbing, cyanosis, or edema Respiratory: Normal respiratory effort, no increased work of breathing Skin: No rashes, bruises or suspicious lesions Neurologic: Grossly intact, no focal deficits, moving all 4 extremities Psychiatric: Normal mood and affect  Laboratory Data: Results for orders placed or performed in visit on 12/04/19  CULTURE, URINE COMPREHENSIVE   Specimen: Urine   UR  Result Value Ref Range   Urine Culture, Comprehensive Preliminary report (A)    Organism ID, Bacteria Escherichia coli (A)   Microscopic Examination   Urine  Result Value Ref Range   WBC, UA >30 (A) 0 - 5 /hpf   RBC 3-10 (A) 0 - 2 /hpf   Epithelial Cells (non renal) 0-10 0 - 10 /hpf   Renal Epithel, UA 0-10 (A) None seen /hpf   Bacteria, UA Many (A) None seen/Few  Urinalysis, Complete  Result Value Ref Range   Specific Gravity, UA 1.020 1.005 - 1.030   pH, UA 6.0 5.0 - 7.5   Color, UA Yellow Yellow   Appearance Ur Cloudy (A) Clear   Leukocytes,UA 1+ (A) Negative   Protein,UA 1+ (A) Negative/Trace   Glucose, UA Negative Negative   Ketones, UA Negative Negative   RBC, UA 1+ (A) Negative   Bilirubin, UA Negative  Negative   Urobilinogen, Ur 0.2 0.2 - 1.0 mg/dL   Nitrite, UA Negative Negative   Microscopic Examination See below:    Assessment & Plan:   1. Dysuria Catheterized UA today grossly infected, will send for culture for further evaluation and start empiric cefuroxime 250 mg twice daily x7 days.  No apparent risk factors for colovesical fistula.  May consider cystoscopy for further evaluation of frequent UTIs. - Urinalysis, Complete - CULTURE, URINE COMPREHENSIVE - cefUROXime (CEFTIN) 250 MG tablet; Take 1 tablet (250 mg total) by mouth 2 (two) times daily with a meal  for 7 days.  Dispense: 14 tablet; Refill: 0  Return if symptoms worsen or fail to improve.  Carman Ching, PA-C  [facility] [address] [place], [place] [address] [phone]

## 2019-12-08 LAB — CULTURE, URINE COMPREHENSIVE

## 2019-12-09 ENCOUNTER — Ambulatory Visit: Payer: Self-pay | Admitting: Urology

## 2019-12-10 NOTE — Progress Notes (Signed)
12/11/2019 4:24 PM   [name] Cooper 1941/01/03 [id number]  Referring provider: [name], MD [address] [facility] [place],  [place] [address]  Chief Complaint  Patient presents with  . Recurrent UTI    HPI: [name] Cooper is a 79 y.o. female with rUTI's, vaginal atrophy and nephrolithiasis who presents today for an UTI.    E. Coli UTI resistant to ampicillin, ciprofloxacin, levofloxacin, tetracycline and trimethoprim/sulfa on 07/15/2019 E. Coli UTI resistant to ampicillin, ciprofloxacin, gentamicin, levofloxacin, tetracycline and trimethoprim/sulfa on 08/10/2019 E. Coli UTI resistant to ampicillin, ciprofloxacin, gentamicin and levofloxacin on 11/05/2019 E. Coli UTI resistant to ampicillin, ciprofloxacin, gentamicin, levofloxacin, tetracycline and trimethoprim/sulfa on 09/23/2019 E. Coli UTI resistant to ampicillin, ciprofloxacin, gentamicin and levofloxacin on 11/05/2019 E. Coli UTI resistant to ampicillin, ciprofloxacin, levofloxacin, tetracycline and trimethoprim/sulfa on 11/18/2019 E. Coli UTI resistant to ampicillin, ciprofloxacin, levofloxacin, tetracycline and trimethoprim/sulfa on 12/04/2019  She does have a history of nephrolithiasis.  She may have had URS to remove the stones.  She is not sexually active.   She is postmenopausal.   She denies constipation and/or diarrhea.  She does engage in good perineal hygiene. She does not take tub baths. She does not have incontinence.  KUB with right renal stones and calcified uterine fibroid.     RUS 10/14/2019 bilateral renal cysts, but no hydronephrosis or stones  CATH UA benign.  Today, she feels well.  She states that she has not had any symptoms of urinary urgency, suprapubic pain or lower back pain.  Patient denies any modifying or aggravating factors.  Patient denies any gross hematuria, dysuria or suprapubic/flank pain.  Patient denies any fevers, chills, nausea or vomiting.   Her daughter, Vivien Rota, called  while we were in the room and she told her daughter that she was feeling fine and would call her when she left our office.  Her biggest concern today is her loneliness.  She is a widow and her adult children live in [place], so she is mostly alone.  She is wanting to get back into the dating world, so that she can travel which is 1 thing she enjoys.   PMH: Past Medical History:  Diagnosis Date  . Arthritis    left hip, left ankle  . Cholelithiases   . CKD (chronic kidney disease), stage II   . Coronary artery disease    a. cardiac cath 01/2011: mid LAD stent patent, mild lumenal irreg's, nl EF 55%, no AS/MR  . Fibromyalgia   . Frozen shoulder   . History of kidney stones   . HTN (hypertension)    controlled with medication;   . Hyperlipidemia    a. statin intolerant  . Iron deficiency   . Thrombocytopenia ([place])   . UTI (urinary tract infection)   . Venous stasis     Surgical History: Past Surgical History:  Procedure Laterality Date  . CARDIAC CATHETERIZATION     stents placed in mid LAD  . CERVICAL SPINE SURGERY    . FETAL BLOOD TRANSFUSION  2014  . Frozen Shoulder    . KIDNEY STONE SURGERY    . LEG SURGERY     broke her leg   . LOWER EXTREMITY ANGIOGRAPHY Right 02/17/2019   Procedure: LOWER EXTREMITY ANGIOGRAPHY;  Surgeon: Katha Cabal, MD;  Location: [facility];  Service: Cardiovascular;  Laterality: Right;  . LOWER EXTREMITY ANGIOGRAPHY Left 03/18/2019   Procedure: LOWER EXTREMITY ANGIOGRAPHY;  Surgeon: Katha Cabal, MD;  Location: [facility]  INVASIVE CV LAB;  Service: Cardiovascular;  Laterality: Left;    Home Medications:  Allergies as of 12/11/2019      Reactions   Sulfa Antibiotics Anaphylaxis      Medication List       Accurate as of December 11, 2019 11:59 PM. If you have any questions, ask your nurse or doctor.        STOP taking these medications   nitrofurantoin (macrocrystal-monohydrate) 100 MG capsule Commonly known as:  MACROBID Stopped by: Zara Council, PA-C     TAKE these medications   aspirin EC 81 MG tablet Take 1 tablet (81 mg total) by mouth daily.   atorvastatin 20 MG tablet Commonly known as: LIPITOR Take 1 tablet (20 mg total) by mouth daily.   cefUROXime 250 MG tablet Commonly known as: CEFTIN Take 1 tablet (250 mg total) by mouth 2 (two) times daily with a meal for 7 days.   clopidogrel 75 MG tablet Commonly known as: Plavix Take 1 tablet (75 mg total) by mouth daily.   estradiol 0.1 MG/GM vaginal cream Commonly known as: ESTRACE Apply a pea size amount Monday, Wednesday, and Friday night   gabapentin 100 MG capsule Commonly known as: NEURONTIN Take 100 mg by mouth at bedtime.   losartan-hydrochlorothiazide 100-25 MG tablet Commonly known as: HYZAAR TAKE 1 TABLET BY MOUTH DAILY   mirabegron ER 25 MG Tb24 tablet Commonly known as: MYRBETRIQ Take 1 tablet (25 mg total) by mouth daily.   temazepam 30 MG capsule Commonly known as: RESTORIL Take 30 mg by mouth at bedtime.   Uribel 118 MG Caps Take 1 capsule (118 mg total) by mouth 4 (four) times daily as needed.       Allergies:  Allergies  Allergen Reactions  . Sulfa Antibiotics Anaphylaxis    Family History: Family History  Problem Relation Age of Onset  . Hypertension Mother   . Heart attack Mother   . Heart attack Father   . Heart disease Brother        h/o CABG  . Diabetes Brother   . Kidney disease Brother   . Kidney cancer Neg Hx   . Bladder Cancer Neg Hx   . Breast cancer Neg Hx     Social History:  reports that she has never smoked. She has never used smokeless tobacco. She reports that she does not drink alcohol and does not use drugs.  ROS: For pertinent review of systems please refer to history of present illness  Physical Exam: BP (!) 155/75   Pulse 75   Ht 5' 7"  (1.702 m)   Wt 182 lb (82.6 kg)   BMI 28.51 kg/m   Constitutional:  Well nourished. Alert and oriented, No acute  distress. HEENT: [place] AT, mask in place.  Trachea midline Cardiovascular: No clubbing, cyanosis, or edema. Respiratory: Normal respiratory effort, no increased work of breathing. Neurologic: Grossly intact, no focal deficits, moving all 4 extremities. Psychiatric: Normal mood and affect.   Laboratory Data: Lab Results  Component Value Date   WBC 8.8 08/01/2017   HGB 9.7 (L) 08/01/2017   HCT 29.0 (L) 08/01/2017   MCV 83.9 08/01/2017   PLT 275 08/01/2017    Lab Results  Component Value Date   CREATININE 1.57 (H) 03/18/2019    Lab Results  Component Value Date   AST 26 01/31/2017   Lab Results  Component Value Date   ALT 13 (L) 01/31/2017   Urinalysis Component     Latest Ref Rng &  Units 12/11/2019  Specific Gravity, UA     1.005 - 1.030 1.025  pH, UA     5.0 - 7.5 5.5  Color, UA     Yellow Yellow  Appearance Ur     Clear Hazy (A)  Leukocytes,UA     Negative Negative  Protein,UA     Negative/Trace 1+ (A)  Glucose, UA     Negative Negative  Ketones, UA     Negative Negative  RBC, UA     Negative Negative  Bilirubin, UA     Negative Negative  Urobilinogen, Ur     0.2 - 1.0 mg/dL 0.2  Nitrite, UA     Negative Negative  Microscopic Examination      See below:   Component     Latest Ref Rng & Units 12/11/2019  WBC, UA     0 - 5 /hpf 0-5  RBC     0 - 2 /hpf 0-2  Epithelial Cells (non renal)     0 - 10 /hpf 0-10  Casts     None seen /lpf Present (A)  Cast Type     N/A Hyaline casts  Crystals     N/A Present (A)  Crystal Type     N/A Amorphous Sediment  Bacteria, UA     None seen/Few None seen   I have reviewed the labs.   Pertinent Imaging:  CLINICAL DATA:  Initial evaluation for recurrent UTIs.  EXAM: RENAL / URINARY TRACT ULTRASOUND COMPLETE  COMPARISON:  Prior renal ultrasound from 01/22/2017 as well as prior CT from 12/28/2013.  FINDINGS: Right Kidney:  Renal measurements: 7.7 x 4.0 x 4.0 cm = volume: 64.1 mL. Mild diffuse  cortical thinning with increased echogenicity within the renal parenchyma. No nephrolithiasis or hydronephrosis. 2.8 x 2.7 x 3.2 cm mildly complex cyst with a few internal septations and possible calcification seen at the interpolar region. Appearance is relatively stable from previous. Additional 3.1 x 2.5 x 2.9 cm simple cyst present at the upper pole.  Left Kidney:  Renal measurements: 9.0 x 4.7 x 5.5 cm = volume: 122.2 mL. Mild diffuse cortical thinning with increased echogenicity within the renal parenchyma. No nephrolithiasis or hydronephrosis. 4.7 x 3.8 x 3.7 cm mildly complex cyst with a few internal septations and possible calcification seen at the upper pole. While this is not well seen on prior ultrasound, this is grossly similar as compared to prior CT from 2015. Additional 3.9 x 4.6 x 3.7 cm simple cyst present at the interpolar region.  Bladder:  Appears normal for degree of bladder distention.  Other:  None.  IMPRESSION: 1. No hydronephrosis or other acute finding. 2. Mild renal atrophy with increased echogenicity within the kidneys bilaterally, suggesting chronic medical renal disease. 3. Benign appearing bilateral renal cysts as above, grossly similar to previous studies.   Electronically Signed   By: Jeannine Boga M.D.   On: 10/15/2019 05:17   Assessment & Plan:    1. Recurrent UTI's  - criteria for recurrent UTI has been met with 2 or more infections in 6 months or 3 or greater infections in one year  - patient is instructed to increase their water intake until the urine is pale yellow or clear (10 to 12 cups daily)  - patient is instructed to take probiotics (yogurt, oral pills or vaginal suppositories), take cranberry pills or drink the juice, D-Mannose and Vitamin C 1,000 mg daily to acidify the urine  - avoid soaking in tubs and  wipe front to back after urinating  - advised them to have CATH UA's for urinalysis and culture to  prevent skin, vaginal and/or rectal contamination of the specimen                                   2. Urge incontinence Resolved  3. Vaginal atrophy Continue the vaginal estrogen cream Monday, Wednesday and Friday nights She will follow up in three months for an exam.    4.  Nephrolithiasis Right lower pole stones No intervention warranted at this time                                       No follow-ups on file.  These notes generated with voice recognition software. I apologize for typographical errors.  Zara Council, PA-C  [facility] [address] [place], [place] [address] [phone]

## 2019-12-11 ENCOUNTER — Ambulatory Visit (INDEPENDENT_AMBULATORY_CARE_PROVIDER_SITE_OTHER): Payer: Medicare Other | Admitting: Urology

## 2019-12-11 ENCOUNTER — Other Ambulatory Visit: Payer: Self-pay

## 2019-12-11 ENCOUNTER — Encounter: Payer: Self-pay | Admitting: Urology

## 2019-12-11 VITALS — BP 155/75 | HR 75 | Ht 67.0 in | Wt 182.0 lb

## 2019-12-11 DIAGNOSIS — N952 Postmenopausal atrophic vaginitis: Secondary | ICD-10-CM | POA: Diagnosis not present

## 2019-12-11 DIAGNOSIS — N390 Urinary tract infection, site not specified: Secondary | ICD-10-CM

## 2019-12-11 DIAGNOSIS — N3941 Urge incontinence: Secondary | ICD-10-CM

## 2019-12-11 DIAGNOSIS — N200 Calculus of kidney: Secondary | ICD-10-CM | POA: Diagnosis not present

## 2019-12-11 NOTE — Patient Instructions (Addendum)
Probiotic, D-mannose, cranberry juice, Vitamin C with your iron supplements

## 2019-12-11 NOTE — Progress Notes (Signed)
In and Out Catheterization  Patient is present today for a I & O catheterization due to rUTI's. Patient was cleaned and prepped in a sterile fashion with betadine . A 16FR cath was inserted no complications were noted , 50 ml of urine return was noted, urine was yellow in color. A clean urine sample was collected for UA. Bladder was drained  And catheter was removed with out difficulty.    Preformed by: Gerarda Gunther, RMA  Follow up/ Additional notes:

## 2019-12-14 LAB — MICROSCOPIC EXAMINATION: Bacteria, UA: NONE SEEN

## 2019-12-14 LAB — URINALYSIS, COMPLETE
Bilirubin, UA: NEGATIVE
Glucose, UA: NEGATIVE
Ketones, UA: NEGATIVE
Leukocytes,UA: NEGATIVE
Nitrite, UA: NEGATIVE
RBC, UA: NEGATIVE
Specific Gravity, UA: 1.025 (ref 1.005–1.030)
Urobilinogen, Ur: 0.2 mg/dL (ref 0.2–1.0)
pH, UA: 5.5 (ref 5.0–7.5)

## 2019-12-23 ENCOUNTER — Encounter: Payer: Self-pay | Admitting: Urology

## 2019-12-23 ENCOUNTER — Other Ambulatory Visit: Payer: Self-pay

## 2019-12-23 ENCOUNTER — Ambulatory Visit: Payer: Medicare Other | Admitting: Urology

## 2019-12-23 VITALS — BP 170/84 | HR 66 | Ht 67.0 in | Wt 189.0 lb

## 2019-12-23 DIAGNOSIS — N390 Urinary tract infection, site not specified: Secondary | ICD-10-CM

## 2019-12-23 DIAGNOSIS — N952 Postmenopausal atrophic vaginitis: Secondary | ICD-10-CM

## 2019-12-23 MED ORDER — NITROFURANTOIN MONOHYD MACRO 100 MG PO CAPS: 100.0000 mg | ORAL_CAPSULE | Freq: Two times a day (BID) | ORAL | 0 refills | Status: DC

## 2019-12-23 NOTE — Progress Notes (Signed)
12/23/2019 4:18 PM   [name] Cooper [birth year] [id number]  Referring provider: [name], MD [address] [facility] [place],  [place] [address]  Chief Complaint  Patient presents with  . Recurrent UTI    HPI: [name] Cooper is a 79 y.o. female with rUTI's, vaginal atrophy and nephrolithiasis who presents today for an UTI.    E. Coli UTI resistant to ampicillin, ciprofloxacin, levofloxacin, tetracycline and trimethoprim/sulfa on 07/15/2019 E. Coli UTI resistant to ampicillin, ciprofloxacin, gentamicin, levofloxacin, tetracycline and trimethoprim/sulfa on 08/10/2019 E. Coli UTI resistant to ampicillin, ciprofloxacin, gentamicin and levofloxacin on 11/05/2019 E. Coli UTI resistant to ampicillin, ciprofloxacin, gentamicin, levofloxacin, tetracycline and trimethoprim/sulfa on 09/23/2019 E. Coli UTI resistant to ampicillin, ciprofloxacin, gentamicin and levofloxacin on 11/05/2019 E. Coli UTI resistant to ampicillin, ciprofloxacin, levofloxacin, tetracycline and trimethoprim/sulfa on 11/18/2019 E. Coli UTI resistant to ampicillin, ciprofloxacin, levofloxacin, tetracycline and trimethoprim/sulfa on 12/04/2019  She does have a history of nephrolithiasis.  She may have had URS to remove the stones.  She is not sexually active.   She is postmenopausal.   She denies constipation and/or diarrhea.  She does engage in good perineal hygiene. She does not take tub baths. She does not have incontinence.  KUB with right renal stones and calcified uterine fibroid.     RUS 10/14/2019 bilateral renal cysts, but no hydronephrosis or stones  CATH UA benign.  At her visit on 12/11/2019, she stated she was feeling well.    She states that on Friday she started to experience dysuria.  She was also having associated urinary frequency.  She has been drinking cranberry juice, taking d-mannose and vitamin C, but she states she has not started the probiotics at this time.  Patient denies any  modifying or aggravating factors.  Patient denies any gross hematuria or suprapubic/flank pain.  Patient denies any fevers, chills, nausea or vomiting.   CATH UA is nitrite positive, greater than 30 WBCs and many bacteria.  PVR is 70 mL.    PMH: Past Medical History:  Diagnosis Date  . Arthritis    left hip, left ankle  . Cholelithiases   . CKD (chronic kidney disease), stage II   . Coronary artery disease    a. cardiac cath 01/2011: mid LAD stent patent, mild lumenal irreg's, nl EF 55%, no AS/MR  . Fibromyalgia   . Frozen shoulder   . History of kidney stones   . HTN (hypertension)    controlled with medication;   . Hyperlipidemia    a. statin intolerant  . Iron deficiency   . Thrombocytopenia ([place])   . UTI (urinary tract infection)   . Venous stasis     Surgical History: Past Surgical History:  Procedure Laterality Date  . CARDIAC CATHETERIZATION     stents placed in mid LAD  . CERVICAL SPINE SURGERY    . FETAL BLOOD TRANSFUSION  2014  . Frozen Shoulder    . KIDNEY STONE SURGERY    . LEG SURGERY     broke her leg   . LOWER EXTREMITY ANGIOGRAPHY Right 02/17/2019   Procedure: LOWER EXTREMITY ANGIOGRAPHY;  Surgeon: Katha Cabal, MD;  Location: [facility];  Service: Cardiovascular;  Laterality: Right;  . LOWER EXTREMITY ANGIOGRAPHY Left 03/18/2019   Procedure: LOWER EXTREMITY ANGIOGRAPHY;  Surgeon: Katha Cabal, MD;  Location: [facility];  Service: Cardiovascular;  Laterality: Left;    Home Medications:  Allergies as of 12/23/2019      Reactions  Sulfa Antibiotics Anaphylaxis      Medication List       Accurate as of December 23, 2019  4:18 PM. If you have any questions, ask your nurse or doctor.        aspirin EC 81 MG tablet Take 1 tablet (81 mg total) by mouth daily.   atorvastatin 20 MG tablet Commonly known as: LIPITOR Take 1 tablet (20 mg total) by mouth daily.   clopidogrel 75 MG tablet Commonly known as:  Plavix Take 1 tablet (75 mg total) by mouth daily.   estradiol 0.1 MG/GM vaginal cream Commonly known as: ESTRACE Apply a pea size amount Monday, Wednesday, and Friday night   gabapentin 100 MG capsule Commonly known as: NEURONTIN Take 100 mg by mouth at bedtime.   losartan-hydrochlorothiazide 100-25 MG tablet Commonly known as: HYZAAR TAKE 1 TABLET BY MOUTH DAILY   mirabegron ER 25 MG Tb24 tablet Commonly known as: MYRBETRIQ Take 1 tablet (25 mg total) by mouth daily.   nitrofurantoin (macrocrystal-monohydrate) 100 MG capsule Commonly known as: MACROBID Take 1 capsule (100 mg total) by mouth every 12 (twelve) hours. Started by: Zara Council, PA-C   temazepam 30 MG capsule Commonly known as: RESTORIL Take 30 mg by mouth at bedtime.   Uribel 118 MG Caps Take 1 capsule (118 mg total) by mouth 4 (four) times daily as needed.       Allergies:  Allergies  Allergen Reactions  . Sulfa Antibiotics Anaphylaxis    Family History: Family History  Problem Relation Age of Onset  . Hypertension Mother   . Heart attack Mother   . Heart attack Father   . Heart disease Brother        h/o CABG  . Diabetes Brother   . Kidney disease Brother   . Kidney cancer Neg Hx   . Bladder Cancer Neg Hx   . Breast cancer Neg Hx     Social History:  reports that she has never smoked. She has never used smokeless tobacco. She reports that she does not drink alcohol and does not use drugs.  ROS: For pertinent review of systems please refer to history of present illness  Physical Exam: BP (!) 170/84 (BP Location: Left Arm, Patient Position: Sitting, Cuff Size: Normal)   Pulse 66   Ht _0  (1.702 m)   Wt 189 lb (85.7 kg)   BMI 29.60 kg/m   Constitutional:  Well nourished. Alert and oriented, No acute distress. HEENT: [place] AT, moist mucus membranes.  Trachea midline Cardiovascular: No clubbing, cyanosis, or edema. Respiratory: Normal respiratory effort, no increased work of  breathing. GU: No CVA tenderness.  No bladder fullness or masses.  Atrophic external genitalia, sparse pubic hair distribution, no lesions.  Normal urethral meatus, no lesions, no prolapse, no discharge.   No urethral masses, tenderness and/or tenderness. No bladder fullness, tenderness or masses. Pale vagina mucosa, Fair estrogen effect, no discharge, no lesions.  Anus and perineum are without rashes or lesions.    Neurologic: Grossly intact, no focal deficits, moving all 4 extremities. Psychiatric: Normal mood and affect.   Laboratory Data: Lab Results  Component Value Date   WBC 8.8 08/01/2017   HGB 9.7 (L) 08/01/2017   HCT 29.0 (L) 08/01/2017   MCV 83.9 08/01/2017   PLT 275 08/01/2017    Lab Results  Component Value Date   CREATININE 1.57 (H) 03/18/2019    Lab Results  Component Value Date   AST 26 01/31/2017   Lab Results  Component Value Date   ALT 13 (L) 01/31/2017   Urinalysis Component     Latest Ref Rng & Units 12/23/2019  Specific Gravity, UA     1.005 - 1.030 1.025  pH, UA     5.0 - 7.5 6.0  Color, UA     Yellow Yellow  Appearance Ur     Clear Cloudy (A)  Leukocytes,UA     Negative 1+ (A)  Protein,UA     Negative/Trace 1+ (A)  Glucose, UA     Negative Negative  Ketones, UA     Negative Negative  RBC, UA     Negative Trace (A)  Bilirubin, UA     Negative Negative  Urobilinogen, Ur     0.2 - 1.0 mg/dL 0.2  Nitrite, UA     Negative Positive (A)  Microscopic Examination      See below:   Component     Latest Ref Rng & Units 12/23/2019  WBC, UA     0 - 5 /hpf >30 (A)  RBC     0 - 2 /hpf 0-2  Epithelial Cells (non renal)     0 - 10 /hpf 0-10  Renal Epithel, UA     None seen /hpf 0-10 (A)  Bacteria, UA     None seen/Few Many (A)   I have reviewed the labs.   Pertinent Imaging:  CLINICAL DATA:  Initial evaluation for recurrent UTIs.  EXAM: RENAL / URINARY TRACT ULTRASOUND COMPLETE  COMPARISON:  Prior renal ultrasound from 01/22/2017  as well as prior CT from 12/28/2013.  FINDINGS: Right Kidney:  Renal measurements: 7.7 x 4.0 x 4.0 cm = volume: 64.1 mL. Mild diffuse cortical thinning with increased echogenicity within the renal parenchyma. No nephrolithiasis or hydronephrosis. 2.8 x 2.7 x 3.2 cm mildly complex cyst with a few internal septations and possible calcification seen at the interpolar region. Appearance is relatively stable from previous. Additional 3.1 x 2.5 x 2.9 cm simple cyst present at the upper pole.  Left Kidney:  Renal measurements: 9.0 x 4.7 x 5.5 cm = volume: 122.2 mL. Mild diffuse cortical thinning with increased echogenicity within the renal parenchyma. No nephrolithiasis or hydronephrosis. 4.7 x 3.8 x 3.7 cm mildly complex cyst with a few internal septations and possible calcification seen at the upper pole. While this is not well seen on prior ultrasound, this is grossly similar as compared to prior CT from 2015. Additional 3.9 x 4.6 x 3.7 cm simple cyst present at the interpolar region.  Bladder:  Appears normal for degree of bladder distention.  Other:  None.  IMPRESSION: 1. No hydronephrosis or other acute finding. 2. Mild renal atrophy with increased echogenicity within the kidneys bilaterally, suggesting chronic medical renal disease. 3. Benign appearing bilateral renal cysts as above, grossly similar to previous studies.   Electronically Signed   By: Jeannine Boga M.D.   On: 10/15/2019 05:17  In and Out Catheterization  Patient is present today for a I & O catheterization due to dysuria. Patient was cleaned and prepped in a sterile fashion with betadine . A 14 FR cath was inserted no complications were noted , 70 ml of urine return was noted, urine was dark yellow in color. A clean urine sample was collected for UA and culture. Bladder was drained  And catheter was removed with out difficulty.    Performed by: Zara Council, PA-C   Assessment &  Plan:    1. Recurrent UTI's  - criteria for  recurrent UTI has been met with 2 or more infections in 6 months or 3 or greater infections in one year  - patient is encouraged to continue to increase their water intake until the urine is pale yellow or clear (10 to 12 cups daily)  - patient is instructed to take probiotics (yogurt, oral pills or vaginal suppositories), - continue cranberry pills or drink the juice, D-Mannose and Vitamin C 1,000 mg daily to acidify the urine  - avoid soaking in tubs and wipe front to back after urinating  - advised them to have CATH UA's for urinalysis and culture to prevent skin, vaginal and/or rectal contamination of the specimen -UA is grossly infected, I have sent a prescription for Macrobid -Urine is sent for culture -She will follow-up as scheduled in October  I also shared this information with her daughter Vivien Rota today on the phone.                                   2. Vaginal atrophy Continue the vaginal estrogen cream Monday, Wednesday and Friday nights She will follow up in three months for an exam.    3.  Nephrolithiasis Right lower pole stones No intervention warranted at this time                                       Return for Keep follow-up in October.  These notes generated with voice recognition software. I apologize for typographical errors.  Zara Council, PA-C  [facility] [address] [place], [place] [address] [phone]  I spent 30 minutes on the day of the encounter to include pre-visit record review, face-to-face time with the patient, and post-visit ordering of tests.

## 2019-12-23 NOTE — Patient Instructions (Signed)
probiotics (yogurt, oral pills or vaginal suppositories), take cranberry pills or drink the juice, D-Mannose and Vitamin C 1,000 mg daily to acidify the urine

## 2019-12-24 LAB — URINALYSIS, COMPLETE
Bilirubin, UA: NEGATIVE
Glucose, UA: NEGATIVE
Ketones, UA: NEGATIVE
Nitrite, UA: POSITIVE — AB
Specific Gravity, UA: 1.025 (ref 1.005–1.030)
Urobilinogen, Ur: 0.2 mg/dL (ref 0.2–1.0)
pH, UA: 6 (ref 5.0–7.5)

## 2019-12-24 LAB — MICROSCOPIC EXAMINATION: WBC, UA: >30 /hpf — AB (ref 0–5)

## 2019-12-26 LAB — CULTURE, URINE COMPREHENSIVE

## 2019-12-28 ENCOUNTER — Telehealth: Payer: Self-pay | Admitting: Family Medicine

## 2019-12-28 MED ORDER — NITROFURANTOIN MONOHYD MACRO 100 MG PO CAPS
100.0000 mg | ORAL_CAPSULE | Freq: Every day | ORAL | 0 refills | Status: DC
Start: 2019-12-28 — End: 2020-02-11

## 2019-12-28 NOTE — Telephone Encounter (Signed)
Patient notified and voiced understanding. New RX was sent to the pharmacy.

## 2019-12-28 NOTE — Telephone Encounter (Signed)
-----   Message from Harle Battiest, PA-C sent at 12/26/2019  7:19 PM EDT ----- Please let [name] Cooper know that her urine culture is positive for infection and that the Macrobid is the appropriate antibiotic.  I then want her to take the Macrobid 100 mg once daily for three months.

## 2020-01-01 ENCOUNTER — Other Ambulatory Visit (INDEPENDENT_AMBULATORY_CARE_PROVIDER_SITE_OTHER): Payer: Self-pay | Admitting: Nurse Practitioner

## 2020-01-01 DIAGNOSIS — I739 Peripheral vascular disease, unspecified: Secondary | ICD-10-CM

## 2020-01-04 ENCOUNTER — Ambulatory Visit (INDEPENDENT_AMBULATORY_CARE_PROVIDER_SITE_OTHER): Payer: Medicare Other

## 2020-01-04 ENCOUNTER — Encounter (INDEPENDENT_AMBULATORY_CARE_PROVIDER_SITE_OTHER): Payer: Self-pay | Admitting: Vascular Surgery

## 2020-01-04 ENCOUNTER — Other Ambulatory Visit: Payer: Self-pay

## 2020-01-04 ENCOUNTER — Ambulatory Visit (INDEPENDENT_AMBULATORY_CARE_PROVIDER_SITE_OTHER): Payer: Medicare Other | Admitting: Vascular Surgery

## 2020-01-04 VITALS — BP 197/72 | HR 75 | Resp 16 | Wt 186.7 lb

## 2020-01-04 DIAGNOSIS — E785 Hyperlipidemia, unspecified: Secondary | ICD-10-CM

## 2020-01-04 DIAGNOSIS — I2510 Atherosclerotic heart disease of native coronary artery without angina pectoris: Secondary | ICD-10-CM

## 2020-01-04 DIAGNOSIS — I70213 Atherosclerosis of native arteries of extremities with intermittent claudication, bilateral legs: Secondary | ICD-10-CM | POA: Diagnosis not present

## 2020-01-04 DIAGNOSIS — M5136 Other intervertebral disc degeneration, lumbar region: Secondary | ICD-10-CM | POA: Diagnosis not present

## 2020-01-04 DIAGNOSIS — I10 Essential (primary) hypertension: Secondary | ICD-10-CM | POA: Diagnosis not present

## 2020-01-04 DIAGNOSIS — I739 Peripheral vascular disease, unspecified: Secondary | ICD-10-CM | POA: Diagnosis not present

## 2020-01-04 NOTE — Progress Notes (Signed)
MRN : [id number]  [name] Cooper is a 79 y.o. ([birth year]) female who presents with chief complaint of  Chief Complaint  Patient presents with  . Follow-up    ultrasound follow up  .  History of Present Illness:  The patient returns to the office for followup and review of the noninvasive studies. There have been no interval changes in lower extremity symptoms. No interval shortening of the patient's claudication distance or development of rest pain symptoms. No new ulcers or wounds have occurred since the last visit.  There have been no significant changes to the patient's overall health care.  The patient denies amaurosis fugax or recent TIA symptoms. There are no recent neurological changes noted. The patient denies history of DVT, PE or superficial thrombophlebitis. The patient denies recent episodes of angina or shortness of breath.   The patient does complain of burning in some instances of her left lower extremity.  She states that it was slightly better after the angiogram but he still continues.  Based on patient's description it is likely related to some neuropathy.  Patient also reports not taking her gabapentin.  ABI Rt=1.08 and Lt=1.06  (previous ABI's Rt=1.12 and Lt=1.08 )  Previous duplex ultrasound of the right lower extremity reveals biphasic waveforms from the femoral artery to the distal tibial arteries with patent stents.  The left lower extremity has triphasic waveforms down to the level of the distal SFA with biphasic monophasic in the distal tibial arteries.  Current Meds  Medication Sig  . aspirin EC 81 MG tablet Take 1 tablet (81 mg total) by mouth daily.  Marland Kitchen atorvastatin (LIPITOR) 20 MG tablet Take 1 tablet (20 mg total) by mouth daily.  . clopidogrel (PLAVIX) 75 MG tablet Take 1 tablet (75 mg total) by mouth daily.  Marland Kitchen estradiol (ESTRACE) 0.1 MG/GM vaginal cream Apply a pea size amount Monday, Wednesday, and Friday night  . gabapentin (NEURONTIN) 100 MG  capsule Take 100 mg by mouth at bedtime.  Marland Kitchen losartan-hydrochlorothiazide (HYZAAR) 100-25 MG tablet TAKE 1 TABLET BY MOUTH DAILY  . Meth-Hyo-M Bl-Na Phos-Ph Sal (URIBEL) 118 MG CAPS Take 1 capsule (118 mg total) by mouth 4 (four) times daily as needed.  . nitrofurantoin, macrocrystal-monohydrate, (MACROBID) 100 MG capsule Take 1 capsule (100 mg total) by mouth every 12 (twelve) hours.  . nitrofurantoin, macrocrystal-monohydrate, (MACROBID) 100 MG capsule Take 1 capsule (100 mg total) by mouth daily.  . temazepam (RESTORIL) 30 MG capsule Take 30 mg by mouth at bedtime.     Past Medical History:  Diagnosis Date  . Arthritis    left hip, left ankle  . Cholelithiases   . CKD (chronic kidney disease), stage II   . Coronary artery disease    a. cardiac cath 01/2011: mid LAD stent patent, mild lumenal irreg's, nl EF 55%, no AS/MR  . Fibromyalgia   . Frozen shoulder   . History of kidney stones   . HTN (hypertension)    controlled with medication;   . Hyperlipidemia    a. statin intolerant  . Iron deficiency   . Thrombocytopenia (HCC)   . UTI (urinary tract infection)   . Venous stasis     Past Surgical History:  Procedure Laterality Date  . CARDIAC CATHETERIZATION     stents placed in mid LAD  . CERVICAL SPINE SURGERY    . FETAL BLOOD TRANSFUSION  2014  . Frozen Shoulder    . KIDNEY STONE SURGERY    . LEG SURGERY  broke her leg   . LOWER EXTREMITY ANGIOGRAPHY Right 02/17/2019   Procedure: LOWER EXTREMITY ANGIOGRAPHY;  Surgeon: Renford Dills, MD;  Location: [facility] IN[facility]IVE CV [facility];  Service: Cardiovascular;  Laterality: Right;  . LOWER EXTREMITY ANGIOGRAPHY Left 03/18/2019   Procedure: LOWER EXTREMITY ANGIOGRAPHY;  Surgeon: Renford Dills, MD;  Location: [facility]VASIVE CV [facility];  Service: Cardiovascular;  Laterality: Left;    Social History Social History   Tobacco Use  . Smoking status: Never Smoker  . Smokeless tobacco: Never Used  Vaping Use  . Vaping Use: Never  used  Substance Use Topics  . Alcohol use: No  . Drug use: No    Family History Family History  Problem Relation Age of Onset  . Hypertension Mother   . Heart attack Mother   . Heart attack Father   . Heart disease Brother        h/o CABG  . Diabetes Brother   . Kidney disease Brother   . Kidney cancer Neg Hx   . Bladder Cancer Neg Hx   . Breast cancer Neg Hx     Allergies  Allergen Reactions  . Sulfa Antibiotics Anaphylaxis     REVIEW OF SYSTEMS (Negative unless checked)  Constitutional: [] Weight loss  [] Fever  [] Chills Cardiac: [] Chest pain   [] Chest pressure   [] Palpitations   [] Shortness of breath when laying flat   [] Shortness of breath with exertion. Vascular:  [x] Pain in legs with walking   [] Pain in legs at rest  [] History of DVT   [] Phlebitis   [] Swelling in legs   [] Varicose veins   [] Non-healing ulcers Pulmonary:   [] Uses home oxygen   [] Productive cough   [] Hemoptysis   [] Wheeze  [] COPD   [] Asthma Neurologic:  [] Dizziness   [] Seizures   [] History of stroke   [] History of TIA  [] Aphasia   [] Vissual changes   [] Weakness or numbness in arm   [] Weakness or numbness in leg Musculoskeletal:   [] Joint swelling   [] Joint pain   [] Low back pain Hematologic:  [] Easy bruising  [] Easy bleeding   [] Hypercoagulable state   [] Anemic Gastrointestinal:  [] Diarrhea   [] Vomiting  [] Gastroesophageal reflux/heartburn   [] Difficulty swallowing. Genitourinary:  [] Chronic kidney disease   [] Difficult urination  [] Frequent urination   [] Blood in urine Skin:  [] Rashes   [] Ulcers  Psychological:  [] History of anxiety   []  History of major depression.  Physical Examination  Vitals:   01/04/20 0925  BP: (!) 197/72  Pulse: 75  Resp: 16  Weight: 186 lb 11.2 oz (84.7 kg)   Body mass index is 29.24 kg/m. Gen: WD/WN, NAD Head: [place]/AT, No temporalis wasting.  Ear/Nose/Throat: Hearing grossly intact, nares w/o erythema or drainage Eyes: PER, EOMI, sclera nonicteric.  Neck: Supple, no  large masses.   Pulmonary:  Good air movement, no audible wheezing bilaterally, no use of accessory muscles.  Cardiac: RRR, no JVD Vascular:  Vessel Right Left  Radial Palpable Palpable  PT Palpable Palpable  DP Palpable Not Palpable  Gastrointestinal: Non-distended. No guarding/no peritoneal signs.  Musculoskeletal: M/S 5/5 throughout.  No deformity or atrophy.  Neurologic: CN 2-12 intact. Symmetrical.  Speech is fluent. Motor exam as listed above. Psychiatric: Judgment intact, Mood & affect appropriate for pt's clinical situation. Dermatologic: No rashes or ulcers noted.  No changes consistent with cellulitis.  CBC Lab Results  Component Value Date   WBC 8.8 08/01/2017   HGB 9.7 (L) 08/01/2017   HCT 29.0 (L) 08/01/2017   MCV 83.9  08/01/2017   PLT 275 08/01/2017    BMET    Component Value Date/Time   NA 138 01/31/2017 0921   NA 141 12/28/2013 1100   K 3.6 01/31/2017 0921   K 3.6 12/28/2013 1100   CL 106 01/31/2017 0921   CL 107 12/28/2013 1100   CO2 24 01/31/2017 0921   CO2 28 12/28/2013 1100   GLUCOSE 134 (H) 01/31/2017 0921   GLUCOSE 91 12/28/2013 1100   BUN 36 (H) 03/18/2019 0735   BUN 22 (H) 12/28/2013 1100   CREATININE 1.57 (H) 03/18/2019 0735   CREATININE 1.47 (H) 12/28/2013 1100   CALCIUM 9.1 01/31/2017 0921   CALCIUM 9.1 12/28/2013 1100   GFRNONAA 31 (L) 03/18/2019 0735   GFRNONAA 37 (L) 12/28/2013 1100   GFRNONAA 26 (L) 08/05/2012 0518   GFRAA 36 (L) 03/18/2019 0735   GFRAA 45 (L) 12/28/2013 1100   GFRAA 30 (L) 08/05/2012 0518   CrCl cannot be calculated (Patient's most recent lab result is older than the maximum 21 days allowed.).  COAG Lab Results  Component Value Date   INR 0.9 05/10/2012   INR 1.1 01/22/2011    Radiology No results found.   Assessment/Plan 1. Atherosclerosis of native artery of both lower extremities with intermittent claudication (HCC) Recommend:  The patient has evidence of atherosclerosis of the lower extremities  with claudication.  The patient does not voice lifestyle limiting changes at this point in time.  Noninvasive studies do not suggest clinically significant change.  No invasive studies, angiography or surgery at this time The patient should continue walking and begin a more formal exercise program.  The patient should continue antiplatelet therapy and aggressive treatment of the lipid abnormalities  No changes in the patient's medications at this time  The patient should continue wearing graduated compression socks 10-15 mmHg strength to control the mild edema.   - VAS [place] LOWER EXTREMITY ARTERIAL DUPLEX; Future - VAS [place] ABI WITH/WO TBI; Future  2. Essential hypertension, benign Continue antihypertensive medications as already ordered, these medications have been reviewed and there are no changes at this time.   3. Atherosclerosis of native coronary artery of native heart without angina pectoris Continue cardiac and antihypertensive medications as already ordered and reviewed, no changes at this time.  Continue statin as ordered and reviewed, no changes at this time  Nitrates PRN for chest pain   4. DDD (degenerative disc disease), lumbar Continue NSAID medications as already ordered, these medications have been reviewed and there are no changes at this time.  Continued activity and therapy was stressed.   5. Hyperlipidemia, unspecified hyperlipidemia type Continue statin as ordered and reviewed, no changes at this time    Levora Dredge, MD  01/04/2020 9:39 AM

## 2020-01-08 ENCOUNTER — Encounter (INDEPENDENT_AMBULATORY_CARE_PROVIDER_SITE_OTHER): Payer: Self-pay | Admitting: Vascular Surgery

## 2020-01-08 DIAGNOSIS — I70219 Atherosclerosis of native arteries of extremities with intermittent claudication, unspecified extremity: Secondary | ICD-10-CM | POA: Insufficient documentation

## 2020-01-14 NOTE — Progress Notes (Signed)
01/15/2020 9:29 AM   [name] Cooper 03-Jul-1940 [id number]  Referring provider: [name], MD [address] [facility] [place],  [place] [address]  Chief Complaint  Patient presents with  . Recurrent UTI    HPI: [name] Cooper is a 79 y.o. female with rUTI's, vaginal atrophy and nephrolithiasis who presents today for an UTI.    E. Coli UTI resistant to ampicillin, ciprofloxacin, levofloxacin, tetracycline and trimethoprim/sulfa on 07/15/2019 E. Coli UTI resistant to ampicillin, ciprofloxacin, gentamicin, levofloxacin, tetracycline and trimethoprim/sulfa on 08/10/2019 E. Coli UTI resistant to ampicillin, ciprofloxacin, gentamicin and levofloxacin on 11/05/2019 E. Coli UTI resistant to ampicillin, ciprofloxacin, gentamicin, levofloxacin, tetracycline and trimethoprim/sulfa on 09/23/2019 E. Coli UTI resistant to ampicillin, ciprofloxacin, gentamicin and levofloxacin on 11/05/2019 E. Coli UTI resistant to ampicillin, ciprofloxacin, levofloxacin, tetracycline and trimethoprim/sulfa on 11/18/2019 E. Coli UTI resistant to ampicillin, ciprofloxacin, levofloxacin, tetracycline and trimethoprim/sulfa on 12/04/2019 E. coli UTI resistant to ampicillin, ciprofloxacin, gentamicin, levofloxacin, tetracycline and trimethoprim/sulfa on December 23, 2019  She does have a history of nephrolithiasis.  She may have had URS to remove the stones.  She is not sexually active.   She is postmenopausal.   She denies constipation and/or diarrhea.  She does engage in good perineal hygiene. She does not take tub baths. She does not have incontinence.  KUB with right renal stones and calcified uterine fibroid.     RUS 10/14/2019 bilateral renal cysts, but no hydronephrosis or stones  Currently on suppressive Macrobid therapy  Since being on the suppressive Macrobid therapy, she states that she is no longer having urinary incontinence or urinary urgency.  She also does not have symptoms of a  urinary tract infection.  Patient denies any modifying or aggravating factors.  Patient denies any gross hematuria, dysuria or suprapubic/flank pain.  Patient denies any fevers, chills, nausea or vomiting.   She continues to engage in UTI preventative strategies.  She is taking cranberry tablets, probiotics and using the vaginal estrogen cream.  PMH: Past Medical History:  Diagnosis Date  . Arthritis    left hip, left ankle  . Cholelithiases   . CKD (chronic kidney disease), stage II   . Coronary artery disease    a. cardiac cath 01/2011: mid LAD stent patent, mild lumenal irreg's, nl EF 55%, no AS/MR  . Fibromyalgia   . Frozen shoulder   . History of kidney stones   . HTN (hypertension)    controlled with medication;   . Hyperlipidemia    a. statin intolerant  . Iron deficiency   . Thrombocytopenia (HCC)   . UTI (urinary tract infection)   . Venous stasis     Surgical History: Past Surgical History:  Procedure Laterality Date  . CARDIAC CATHETERIZATION     stents placed in mid LAD  . CERVICAL SPINE SURGERY    . FETAL BLOOD TRANSFUSION  2014  . Frozen Shoulder    . KIDNEY STONE SURGERY    . LEG SURGERY     broke her leg   . LOWER EXTREMITY ANGIOGRAPHY Right 02/17/2019   Procedure: LOWER EXTREMITY ANGIOGRAPHY;  Surgeon: Renford Dills, MD;  Location: [facility];  Service: Cardiovascular;  Laterality: Right;  . LOWER EXTREMITY ANGIOGRAPHY Left 03/18/2019   Procedure: LOWER EXTREMITY ANGIOGRAPHY;  Surgeon: Renford Dills, MD;  Location: [facility] CV [facility];  Service: Cardiovascular;  Laterality: Left;    Home Medications:  Allergies as of 01/15/2020      Reactions   Sulfa Antibiotics  Anaphylaxis      Medication List       Accurate as of January 15, 2020  9:29 AM. If you have any questions, ask your nurse or doctor.        aspirin EC 81 MG tablet Take 1 tablet (81 mg total) by mouth daily.   atorvastatin 20 MG tablet Commonly known as:  LIPITOR Take 1 tablet (20 mg total) by mouth daily.   clopidogrel 75 MG tablet Commonly known as: Plavix Take 1 tablet (75 mg total) by mouth daily.   estradiol 0.1 MG/GM vaginal cream Commonly known as: ESTRACE Apply a pea size amount Monday, Wednesday, and Friday night   gabapentin 100 MG capsule Commonly known as: NEURONTIN Take 100 mg by mouth at bedtime.   losartan-hydrochlorothiazide 100-25 MG tablet Commonly known as: HYZAAR TAKE 1 TABLET BY MOUTH DAILY   mirabegron ER 25 MG Tb24 tablet Commonly known as: MYRBETRIQ Take 1 tablet (25 mg total) by mouth daily.   nitrofurantoin (macrocrystal-monohydrate) 100 MG capsule Commonly known as: MACROBID Take 1 capsule (100 mg total) by mouth daily. What changed: Another medication with the same name was removed. Continue taking this medication, and follow the directions you see here. Changed by: Josehua Hammar, PA-C   temazepam 30 MG capsule Commonly known as: RESTORIL Take 30 mg by mouth at bedtime.   Uribel 118 MG Caps Take 1 capsule (118 mg total) by mouth 4 (four) times daily as needed.       Allergies:  Allergies  Allergen Reactions  . Sulfa Antibiotics Anaphylaxis    Family History: Family History  Problem Relation Age of Onset  . Hypertension Mother   . Heart attack Mother   . Heart attack Father   . Heart disease Brother        h/o CABG  . Diabetes Brother   . Kidney disease Brother   . Kidney cancer Neg Hx   . Bladder Cancer Neg Hx   . Breast cancer Neg Hx     Social History:  reports that she has never smoked. She has never used smokeless tobacco. She reports that she does not drink alcohol and does not use drugs.  ROS: For pertinent review of systems please refer to history of present illness  Physical Exam: BP (!) 181/75   Pulse 80   Ht 5\' 7"  (1.702 m)   Wt 192 lb (87.1 kg)   BMI 30.07 kg/m   Constitutional:  Well nourished. Alert and oriented, No acute distress. HEENT: [place] AT, mask in  place.  Trachea midline Cardiovascular: No clubbing, cyanosis, or edema. Respiratory: Normal respiratory effort, no increased work of breathing. Neurologic: Grossly intact, no focal deficits, moving all 4 extremities. Psychiatric: Normal mood and affect.   Laboratory Data: Lab Results  Component Value Date   WBC 8.8 08/01/2017   HGB 9.7 (L) 08/01/2017   HCT 29.0 (L) 08/01/2017   MCV 83.9 08/01/2017   PLT 275 08/01/2017    Lab Results  Component Value Date   CREATININE 1.57 (H) 03/18/2019    Lab Results  Component Value Date   AST 26 01/31/2017   Lab Results  Component Value Date   ALT 13 (L) 01/31/2017   Urinalysis Component     Latest Ref Rng & Units 12/23/2019  Specific Gravity, UA     1.005 - 1.030 1.025  pH, UA     5.0 - 7.5 6.0  Color, UA     Yellow Yellow  Appearance Ur  Clear Cloudy (A)  Leukocytes,UA     Negative 1+ (A)  Protein,UA     Negative/Trace 1+ (A)  Glucose, UA     Negative Negative  Ketones, UA     Negative Negative  RBC, UA     Negative Trace (A)  Bilirubin, UA     Negative Negative  Urobilinogen, Ur     0.2 - 1.0 mg/dL 0.2  Nitrite, UA     Negative Positive (A)  Microscopic Examination      See below:   Component     Latest Ref Rng & Units 12/23/2019  WBC, UA     0 - 5 /hpf >30 (A)  RBC     0 - 2 /hpf 0-2  Epithelial Cells (non renal)     0 - 10 /hpf 0-10  Renal Epithel, UA     None seen /hpf 0-10 (A)  Bacteria, UA     None seen/Few Many (A)   I have reviewed the labs.   Pertinent Imaging:  CLINICAL DATA:  Initial evaluation for recurrent UTIs.  EXAM: RENAL / URINARY TRACT ULTRASOUND COMPLETE  COMPARISON:  Prior renal ultrasound from 01/22/2017 as well as prior CT from 12/28/2013.  FINDINGS: Right Kidney:  Renal measurements: 7.7 x 4.0 x 4.0 cm = volume: 64.1 mL. Mild diffuse cortical thinning with increased echogenicity within the renal parenchyma. No nephrolithiasis or hydronephrosis. 2.8 x 2.7 x 3.2  cm mildly complex cyst with a few internal septations and possible calcification seen at the interpolar region. Appearance is relatively stable from previous. Additional 3.1 x 2.5 x 2.9 cm simple cyst present at the upper pole.  Left Kidney:  Renal measurements: 9.0 x 4.7 x 5.5 cm = volume: 122.2 mL. Mild diffuse cortical thinning with increased echogenicity within the renal parenchyma. No nephrolithiasis or hydronephrosis. 4.7 x 3.8 x 3.7 cm mildly complex cyst with a few internal septations and possible calcification seen at the upper pole. While this is not well seen on prior ultrasound, this is grossly similar as compared to prior CT from 2015. Additional 3.9 x 4.6 x 3.7 cm simple cyst present at the interpolar region.  Bladder:  Appears normal for degree of bladder distention.  Other:  None.  IMPRESSION: 1. No hydronephrosis or other acute finding. 2. Mild renal atrophy with increased echogenicity within the kidneys bilaterally, suggesting chronic medical renal disease. 3. Benign appearing bilateral renal cysts as above, grossly similar to previous studies.   Electronically Signed   By: Rise MuBenjamin  McClintock M.D.   On: 10/15/2019 05:17   Assessment & Plan:    1. Recurrent UTI's  Asymptomatic at this visit Continue preventative strategies Continue Macrobid 100 mg daily for suppressive therapy She will follow-up in 3 months for symptom recheck                                   2. Vaginal atrophy Continue the vaginal estrogen cream Monday, Wednesday and Friday nights She will follow up in three months for an exam.    3.  Nephrolithiasis Right lower pole stones No intervention warranted at this time                                       Return in about 3 months (around 04/16/2020) for exam and symptom recheck.  These notes  generated with voice recognition software. I apologize for typographical errors.  Michiel Cowboy, PA-C  [facility] [address] [place], [place] [address] [phone]  I spent 15 minutes on the day of the encounter to include pre-visit record review, face-to-face time with the patient, and post-visit ordering of tests.

## 2020-01-15 ENCOUNTER — Other Ambulatory Visit: Payer: Self-pay

## 2020-01-15 ENCOUNTER — Ambulatory Visit: Payer: Medicare Other | Admitting: Urology

## 2020-01-15 ENCOUNTER — Encounter: Payer: Self-pay | Admitting: Urology

## 2020-01-15 VITALS — BP 181/75 | HR 80 | Ht 67.0 in | Wt 192.0 lb

## 2020-01-15 DIAGNOSIS — N952 Postmenopausal atrophic vaginitis: Secondary | ICD-10-CM

## 2020-01-15 DIAGNOSIS — N390 Urinary tract infection, site not specified: Secondary | ICD-10-CM

## 2020-01-15 DIAGNOSIS — N200 Calculus of kidney: Secondary | ICD-10-CM | POA: Diagnosis not present

## 2020-01-31 NOTE — Progress Notes (Signed)
Office Visit    Patient Name: [name] Cooper Date of Encounter: 02/01/2020  Primary Care Provider:  [name], MD Primary Cardiologist:  Julien Nordmann, MD  Chief Complaint    Chief Complaint  Patient presents with  . office visit    Hospital F/U; Meds verbally reviewed with patient.    79 yo female with history of CAD s/p PCI/stenting to mLAD with repeat stenting for ISR over 10 years prior, b/l carotid arterial stenosis, PAD s/p intervention, hyperlipidemia with statin intolerance, hypertension, fibromyalgia, diffuse muscle aches, and arthritis, and who presents today for follow-up of [facility] admission for elevated BP.  Past Medical History    Past Medical History:  Diagnosis Date  . Arthritis    left hip, left ankle  . Cholelithiases   . CKD (chronic kidney disease), stage II   . Coronary artery disease    a. cardiac cath 01/2011: mid LAD stent patent, mild lumenal irreg's, nl EF 55%, no AS/MR  . Fibromyalgia   . Frozen shoulder   . History of kidney stones   . HTN (hypertension)    controlled with medication;   . Hyperlipidemia    a. statin intolerant  . Iron deficiency   . Thrombocytopenia (HCC)   . UTI (urinary tract infection)   . Venous stasis    Past Surgical History:  Procedure Laterality Date  . CARDIAC CATHETERIZATION     stents placed in mid LAD  . CERVICAL SPINE SURGERY    . FETAL BLOOD TRANSFUSION  2014  . Frozen Shoulder    . KIDNEY STONE SURGERY    . LEG SURGERY     broke her leg   . LOWER EXTREMITY ANGIOGRAPHY Right 02/17/2019   Procedure: LOWER EXTREMITY ANGIOGRAPHY;  Surgeon: Renford Dills, MD;  Location: [facility] INVASIVE CV LAB;  Service: Cardiovascular;  Laterality: Right;  . LOWER EXTREMITY ANGIOGRAPHY Left 03/18/2019   Procedure: LOWER EXTREMITY ANGIOGRAPHY;  Surgeon: Renford Dills, MD;  Location: [facility] INVASIVE CV LAB;  Service: Cardiovascular;  Laterality: Left;    Allergies  Allergies  Allergen Reactions  . Sulfa  Antibiotics Anaphylaxis    History of Present Illness    [name] Cooper is a 79 y.o. female with PMH as above. Per review of primary cardiologist notes, she works as a crossing guard.  She is originally from [place].  She and her husband moved to [place] to be closer to the children.  She is since widowed.  Her children now live in [place].  She does have a history of recurrent UTIs.  She has a history of fibromyalgia with diffuse muscle aches and arthritis.  She was previously seen by rheumatology and completed a course of prednisone without improvement in her symptoms.  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   She has history of bilateral carotid disease at 1-39% RICA, RECA >50%, LICA 40 -59% (06/08/19) with recommendation for repeat study in 12 months (~05/2020).  She has a history of CAD s/p remote h/o mLAD stent. LHC 01/2011 for CP showed patent mLAD stent without significant ISR.  Mild luminal irregularities were noted.  EF estimated 55%.  It was felt her chest pain could be 2/2 small vessel disease and medical management recommended.  She has history of PAD s/p intervention. On 01/29/19, she has been seen by vascular surgery for painful lower extremities with activity with diminished pulses.  Evidence of severe atherosclerotic changes were noted to both lower extremities  with rest pain associated with preulcerative changes and impending tissue loss on the right foot.  On 01/2019 she underwent successful recanalization of right lower extremity for limb salvage.  On 02/2020, she underwent lower extremity angiography stent placement of left superficial femoral artery artery, also for limb salvage. Most recent vascular study without significant vascular dz reported 12/2019.  She was last seen by her primary cardiologist 03/11/2018, at which time she reported some shortness of breath with overexertion. She did not have a regular exercise program but  stayed busy.  A regular walking plan was recommended, as well as weight loss through low carbohydrate diet.   She was not taking ASA on a regular basis.  BP was noted to be stable.  She declined a statin.  Myalgias were noted on Crestor and Lipitor.  She reportedly was started on PCSK9 inhibitors at that time, as notes indicate a request was placed.  She underwent an echo at [facility] 11/1/1 that showed LVEF 60 to 65%, G3DD, mild to moderate LAE, mild RAE,RAP 5 to 10 mmHg.  She returns today for hospital follow-up after she was seen at the [facility] ED 10/31 for hypertensive urgency.  On review of EMR, she presented to [facility] ED with report of 3 to 4 days of a ticking type sound in her chest without actual chest pain on 01/24/2020.  Initial vitals showed BP 175/70, HR 65 bpm, 100% on room air.   Subsequent vitals showed elevated SBP up to 230 per EMR.  IV labetalol 20 mg was started with SBP decreasing to SBP 216. Per documentation, it was suspected that her elevated SBP was 2/2 medication noncompliance (not taking her Hyzaar) and recent steroid injection.  She noted lower extremity edema for the last 2 to 3 months. She had skipped her Hyzaar 100-25 mg daily for at least a few days.  Hyzaar was restarted at [facility] with further improvement of SBP to 140-170 range.  Echo showed EF 60 to 65%, G3DD.  Labs showed BNP 416 and minimally elevated / flat trending troponin at 42, 41, 44, 41. Given elevated BP and CKD, HS Tn suspected as elevated 2/2 supply demand ischemia. She was discharged with 3 days of p.o. Lasix 20 mg with recommendation to follow-up with her primary cardiologist.  Iron deficiency anemia was noted with normocytic approaching microcytic anemia.  Hemoglobin 9.4 with baseline around 10 according to review of records.  It was discussed that she would need IV iron in the outpatient setting.    *Of note, additional documentation from this admission noted that it was discussed she values her independence and takes pride in  her home; however, some memory issues were noted.  She denied any problems with her memory on questioning; however, she did report her daughter had concerns regarding her memory and she thus underwent a memory evaluation in [place].  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  It was suspected this may be worsened by polypharmacy, given her medication list also included BZD's.  Formal advanced care planning was recommended.  It was also recommended she may benefit from in-home services or being closer to family in time.  Today, 02/01/2020, she returns to clinic and reports she is doing well from a cardiac standpoint.  She does not recall the reason for her most recent [facility] admission, which we reviewed today in great detail.  It was clarified that she did not recently have a cardiac catheterization, which she initially reported  as the reason for her visit.  We did review her h/o vascular angiography; however, she does not remember the details surrounding her PAD or workup, as well as her recent visit to [facility] as documented in EMR.   Initially, she declined BP measurement today, as she had residual bruising and TTP of the bilateral upper extremities due to IV placement and injections from her recent [facility] admision. After further discussion, and given her most recent [facility] admission for elevated BP, she was agreeable to BP measurement today with clinic BP 130/60.  She is not checking her BP at home with recommendation that she start to monitor and pt agreement.  Sodium and fluid restrictions reviewed, as well as daily weights, and with pt understanding.  No reported CP. She did note some DOE and SOB. No tachypalpitations, presyncope, or syncope. She noted a recent mechanical fall, during which time she bruised her legs but did not lose consciousness or hit her head.  She is unable to recall the exact date of this fall; however, she believes it was Thursday or Friday of  last week.  She is still working as a crossing guard.  She notes ongoing lower extremity edema.  She states her legs feel tight, which is an ongoing issue per patient.  Reds vest today = 33.  She denies any orthopnea, PND, early satiety, abdominal distention, or weight gain.  She reports medication compliance.  She denies any signs or symptoms of bleeding.  She does not have a regular workout routine but does note that she remains active around the house and with her crossing guard job.  Home Medications    Current Outpatient Medications on File Prior to Visit  Medication Sig Dispense Refill  . aspirin EC 81 MG tablet Take 1 tablet (81 mg total) by mouth daily. 150 tablet 2  . atorvastatin (LIPITOR) 20 MG tablet Take 1 tablet (20 mg total) by mouth daily. 90 tablet 3  . clopidogrel (PLAVIX) 75 MG tablet Take 1 tablet (75 mg total) by mouth daily. 30 tablet 4  . estradiol (ESTRACE) 0.1 MG/GM vaginal cream Apply a pea size amount Monday, Wednesday, and Friday night 42.5 g 12  . gabapentin (NEURONTIN) 100 MG capsule Take 100 mg by mouth at bedtime.    Marland Kitchen losartan-hydrochlorothiazide (HYZAAR) 100-25 MG tablet TAKE 1 TABLET BY MOUTH DAILY 90 tablet 0  . nitrofurantoin, macrocrystal-monohydrate, (MACROBID) 100 MG capsule Take 1 capsule (100 mg total) by mouth daily. 90 capsule 0  . temazepam (RESTORIL) 30 MG capsule Take 15 mg by mouth at bedtime.     . Meth-Hyo-M Bl-Na Phos-Ph Sal (URIBEL) 118 MG CAPS Take 1 capsule (118 mg total) by mouth 4 (four) times daily as needed. 20 capsule 1  . mirabegron ER (MYRBETRIQ) 25 MG TB24 tablet Take 1 tablet (25 mg total) by mouth daily. 28 tablet 0   No current facility-administered medications on file prior to visit.    Review of Systems    She reports shortness of breath and dyspnea. No chest pain or tachypalpitations.  No orthopnea, PND, or early satiety.  She reports tenderness of bilateral upper extremities 2/2 recent [facility] admission with IV placement/IV  injections causing ecchymosis and tenderness.  No n, v, dizziness, syncope.  She does report a recent mechanical fall with details unclear, reportedly happening Thursday or Friday of last week.  She reports ongoing edema, described as tightness.  She denies weight gain or early satiety.  All other systems reviewed and are otherwise  negative except as noted above.  Physical Exam    VS:  BP 130/60 (BP Location: Left Arm, Patient Position: Sitting, Cuff Size: Normal) Comment: refused to due pain/bruising in both arms since procedure  Pulse 60   Ht 5\' 7"  (1.702 m)   Wt 185 lb (83.9 kg)   SpO2 99%   BMI 28.98 kg/m  , BMI Body mass index is 28.98 kg/m. GEN: Elderly female.  Well nourished, well developed, in no acute distress. HEENT: normal. Neck: Supple, no JVD, harsh R carotid bruit noted (unclear if exacerbated by radiation from cardiac murmur as below), faint left carotid bruit.  No masses. Cardiac: RRR, 2/6 systolic murmur appreciated throughout the cardiac exam.  No rubs, or gallops. No clubbing, cyanosis.  Bilateral moderate to 1+ edema.  Radials/DP/PT 2+ and equal bilaterally.  Respiratory:  Respirations regular and unlabored, clear to auscultation bilaterally. GI: Soft, nontender, nondistended, BS + x 4. MS: no deformity or atrophy. Skin: warm and dry, no rash. Neuro: Baseline mental status unknown.  Some confusion noted regarding her PMH and recent events.  Strength and sensation are intact. Psych: Normal affect, pleasant.  Accessory Clinical Findings    ECG personally reviewed by me today - NSR, 60 bpm, PR interval 146, no acute changes when compared with previous- no acute changes.  VITALS Reviewed today   Temp Readings from Last 3 Encounters:  04/19/19 97.6 F (36.4 C) (Oral)  03/18/19 97.8 F (36.6 C) (Oral)  02/17/19 97.8 F (36.6 C) (Oral)   BP Readings from Last 3 Encounters:  02/01/20 130/60  01/15/20 (!) 181/75  01/04/20 (!) 197/72   Pulse Readings from Last 3  Encounters:  02/01/20 60  01/15/20 80  01/04/20 75    Wt Readings from Last 3 Encounters:  02/01/20 185 lb (83.9 kg)  01/15/20 192 lb (87.1 kg)  01/04/20 186 lb 11.2 oz (84.7 kg)     LABS  reviewed today   See CareEverywhere below  Lab Results  Component Value Date   WBC 8.8 08/01/2017   HGB 9.7 (L) 08/01/2017   HCT 29.0 (L) 08/01/2017   MCV 83.9 08/01/2017   PLT 275 08/01/2017   Lab Results  Component Value Date   CREATININE 1.57 (H) 03/18/2019   BUN 36 (H) 03/18/2019   NA 138 01/31/2017   K 3.6 01/31/2017   CL 106 01/31/2017   CO2 24 01/31/2017   Lab Results  Component Value Date   ALT 13 (L) 01/31/2017   AST 26 01/31/2017   ALKPHOS 126 01/31/2017   BILITOT 0.4 01/31/2017   Lab Results  Component Value Date   CHOL 214 (H) 12/31/2014   HDL 65 12/31/2014   LDLCALC 131 (H) 12/31/2014   TRIG 88 12/31/2014   CHOLHDL 3.3 12/31/2014    No results found for: HGBA1C No results found for: TSH   *CareEverywhere labs 01/25/2020 BMET: Sodium 141, potassium 4.0, CO2 26.7, BUN 20, creatinine 1.29, glucose 96, calcium 9.0 Magnesium 1.6 RBC 3.05, hemoglobin 8.3, hematocrit 25.2, platelets 213 Iron panel iron 26, TIBC 192.8, transferrin 153.0 High-sensitivity troponin 41 04/22/2018:  Total cholesterol 214, HDL 61.9, LDL 130 06/19/2017  TSH 2.685  STUDIES/PROCEDURES reviewed today   Echo 01/25/20 CareEverywhere Summary  1. The left ventricle is normal in size with mildly increased wall  thickness.  2. The left ventricular systolic function is normal with no obvious wall  motion abnormalities, LVEF is visually estimated at 60-65%.  3. There is grade III diastolic dysfunction (severely elevated filling  pressure).  4. The right ventricle is normal in size, with normal systolic function.  5. The left atrium is mildly to moderately dilated in size.  6. The right atrium is mildly dilated in size.  7. Pulmonary systolic pressure cannot be estimated due  to insufficient TR  jet.  8. IVC size and inspiratory change suggest mildly elevated right atrial  pressure. (5-10 mmHg).  9. Technically difficult study.  10. Echo contrast utilized to enhance endocardial border definition.  Left Ventricle  The left ventricle is normal in size with mildly increased wall thickness.  The left ventricular systolic function is normal with no obvious wall motion  abnormalities, LVEF is visually estimated at 60-65%.  There is grade III diastolic dysfunction (severely elevated filling  pressure).  No left ventricular thrombus visualized.  Right Ventricle  The right ventricle is normal in size, with normal systolic function.  Left Atrium  The left atrium is mildly to moderately dilated in size.  Right Atrium  The right atrium is mildly dilated in size.  Aortic Valve  The aortic valve is trileaflet with normal appearing leaflets with normal  excursion.  There is trivial aortic regurgitation.  There is no evidence of a significant transvalvular gradient.  Pulmonic Valve  The pulmonic valve is poorly visualized, but probably normal.  There is no significant pulmonic regurgitation.  There is no evidence of a significant transvalvular gradient.  Mitral Valve  The mitral valve leaflets are normal with normal leaflet mobility.  There is trivial mitral valve regurgitation.  Tricuspid Valve  The tricuspid valve leaflets are normal, with normal leaflet mobility.  There is trivial tricuspid regurgitation.  Pulmonary systolic pressure cannot be estimated due to insufficient TR jet.  Pulmonary Arteries  The pulmonary artery is not well visualized.  Pericardium/Pleural  There is no pericardial effusion.  Inferior Vena Cava  IVC size and inspiratory change suggest mildly elevated right atrial  pressure. (5-10 mmHg).   ABIs 12/2019 Left TBIs appear increased compared to prior study on 07/08/2019.  Summary:    Right: Resting right ankle-brachial index is within normal range. No  evidence of significant right lower extremity arterial disease. The right  toe-brachial index is normal.  Left: Resting left ankle-brachial index is within normal range. No  evidence of significant left lower extremity arterial disease. The left  toe-brachial index is normal.   LE [place] 06/2019 Summary:  Right: Mild atherosclerosis throughout. Patent SFA stent.  Left: Mild atherosclerosis throughout. Patent SFA stent.  Carotids Summary:  Right Carotid: Velocities in the right ICA are consistent with a 1-39%  stenosis.         The ECA appears >50% stenosed.  Left Carotid: Velocities in the left ICA are consistent with a 40-59%  stenosis.   Vertebrals: Bilateral vertebral arteries demonstrate antegrade flow.  Subclavians: Normal flow hemodynamics were seen in bilateral subclavian        arteries.   01/26/2020  [facility] See scan  Assessment & Plan    Recent non-STEMI with [facility] admission  Suspected as supply demand ischemia in the setting of SBP 200s H/o CAD s/p PCI/stenting to mLAD/ repeat PCI for ISR over 10 years prior --No current chest pain.  She does note ongoing dyspnea and shortness of breath at rest as reported in the past on review of EMR.  She has history of CAD s/p PCI to LAD with repeat PCI for ISR over 10 years prior.  2012 cath with mid LAD stent patent, mild luminal irregularities, normal  EF, no AR/MR. Recent [facility] admission reviewed with minimally elevated and flat trending Hs Tn attributed to supply demand ischemia in the setting of SBP into the 200s during her admission, as well as CKD/anemia. Updated [facility] echo obtained with nl EF, no WMA, and G3DD.  Also noted was mildly elevated RAP. Euvolemic and well compensated on exam today s/p 3 days of Lasix at [facility] discharge and restart of her medications, as documented in [facility] discharge note. ReDS vest obtained and 33.  BP well controlled at 130/60.   EKG without acute ST/T changes. --Repeat labs:  CMET to assess renal function and electrolytes s/p 3 days of Lasix with corresponding restart of Hyzaar. Will also ensure albumin wnl, given low albumin can contribute to LEE.   CBC given [facility] recommendation for IV iron infusions as documented above and as low Hgb can contribute to LEE     --Discussed:   Continued compression stockings, leg elevation.    Monitoring home BP and weights.    Low-salt diet under 2 g daily and total daily fluid under 2 L daily     Monitoring her sx and calling the office if any worsening or new symptoms.   --Continue ASA, Plavix, and Lipitor.   --Increase activity as tolerated. --Aggressive risk factor modification. --Reassess at RTC.  HTN --BP relatively well controlled today at 130/60.   --Continue current medications.  --Recheck labs as above.  Bilateral carotid artery stenosis PAD s/p vascular intervention --Harsh right carotid bruit appreciated today on exam.  Unclear if her R bruit may also be exacerbated by her harsh systolic murmur on exam as well. Previous 05/2019 carotid study performed with results as above and recommendation for follow-up study in 12 months.  However, given her recent documentation of memory issues, and with today's bruit and patient confusion, update a carotid study at this time to ensure stability of carotid disease.  Also considered today is concern for medication compliance, including DAPT and Lipitor as below. --Continue to follow-up with [facility] for PAD as outlined below.  On review of EMR, lower extremity arterial duplex and ABIs ordered already for the future per [facility]. --Continue cholesterol control and DAPT.  Continue Lipitor 20 mg daily.  We will recheck lipids and LFTs today with further recommendations at that time, if indicated.  Continue Plavix 75 mg daily and ASA 81 mg daily.  We will recheck a CBC.  2/6 systolic murmur --2/6 systolic murmur appreciated throughout cardiac  exam and does not appear to be documented in the past n review of EMR.  Recent [facility] echo findings not consistent with harsh murmur on exam today.  No signs of HOCM on recent [facility] echo or severe valvular disease. Discussed this with patient today, as well as possible update of recent echo by our office, especially if any worsening dyspnea or shortness of breath.   She will let us know if any worsening symptoms or repeat falls or dizziness. Reassess at RTC. BP and HR control recommended.  HLD History of statin intolerance --Receck LFTs, lipids.  LDL goal below 70.  Further recommendations, if indicated, pending repeat labs.  For now, continue atorvastatin 20 mg daily.  If LDL remains above goal on labs, recommend initiation of PCSK9 inhibitors, given she has documented statin intolerance in the past.  Recent mechanical fall --Denies Afib fall 2/2 dizziness or syncope.  No chest pain or tachypalpitations prior to fall.  As above in HPI, reports mechanical fall last week.  She did not hit  her head.  No LOC.  As outlined above, consider repeat echo to reassess murmur on today's exam, and given her most recent echo at [facility] does not show any signs of severe valvular disease or note HOCM. Denies a history of frequent mechanical falls.  Reassess at RTC. Recheck electrolytes with BMET, Hgb with CBC. Closely monitor SOB/DOE as outlined above.   ?  Memory issues --Intermittent confusion and memory issues noted during today's visit as well as [facility] admission.  She does have a history of recurrent UTIs, which could be contributing to her current mental status. Will defer any workup of recurrent UTI to PCP. Recheck CMET and CBC today.  With ongoing memory issues, consider additional assistance at home as above.  Update carotids given bruit on exam. Follow-up as scheduled with [facility]. Continue current medications. Continue to monitor home BP and reassess memory at RTC. Recommend close follow-up with PCP.  Iron deficiency  anemia --As above, she was noted to have iron deficiency anemia during her [facility] admission with [facility] recommendation for OP IV iron infusions.  We will recheck a CBC.  It is unclear if she was referred for OP IV infusions. Pending PCP, consider referral for infusions. Monitoring per PCP.   CKD --Recheck of BMET as above and given 3 days lasix and restart of Hyzaar.   Medication changes: None pending labs Labs ordered: CMET, CBC, lipids Studies / Imaging ordered: Bilateral carotid artery ultrasound Future considerations: Consider repeat echo? Defer to primary cardiologist and reassess at RTC.  If LDL not at goal on repeat labs, consider PCSK9 inhibitors. Repeat ReDS at RTC (33 today) Disposition: RTC 3 months with Dr. Mariah Milling, primary cardiologist.     Lennon Alstrom, PA-C 02/01/2020

## 2020-02-01 ENCOUNTER — Encounter: Payer: Self-pay | Admitting: Physician Assistant

## 2020-02-01 ENCOUNTER — Other Ambulatory Visit: Payer: Self-pay

## 2020-02-01 ENCOUNTER — Ambulatory Visit: Payer: Medicare Other | Admitting: Physician Assistant

## 2020-02-01 VITALS — BP 130/60 | HR 60 | Ht 67.0 in | Wt 185.0 lb

## 2020-02-01 DIAGNOSIS — I10 Essential (primary) hypertension: Secondary | ICD-10-CM

## 2020-02-01 DIAGNOSIS — R0989 Other specified symptoms and signs involving the circulatory and respiratory systems: Secondary | ICD-10-CM

## 2020-02-01 DIAGNOSIS — I25118 Atherosclerotic heart disease of native coronary artery with other forms of angina pectoris: Secondary | ICD-10-CM | POA: Diagnosis not present

## 2020-02-01 DIAGNOSIS — E785 Hyperlipidemia, unspecified: Secondary | ICD-10-CM

## 2020-02-01 DIAGNOSIS — I6523 Occlusion and stenosis of bilateral carotid arteries: Secondary | ICD-10-CM | POA: Diagnosis not present

## 2020-02-01 DIAGNOSIS — I252 Old myocardial infarction: Secondary | ICD-10-CM

## 2020-02-01 DIAGNOSIS — I6529 Occlusion and stenosis of unspecified carotid artery: Secondary | ICD-10-CM

## 2020-02-01 DIAGNOSIS — I739 Peripheral vascular disease, unspecified: Secondary | ICD-10-CM

## 2020-02-01 DIAGNOSIS — R413 Other amnesia: Secondary | ICD-10-CM

## 2020-02-01 NOTE — Patient Instructions (Signed)
Medication Instructions:  - Your physician recommends that you continue on your current medications as directed. Please refer to the Current Medication list given to you today.  *If you need a refill on your cardiac medications before your next appointment, please call your pharmacy*   Lab Work: - Your physician recommends that you have lab work today: CMET/ Lipid  If you have labs (blood work) drawn today and your tests are completely normal, you will receive your results only by: Marland Kitchen MyChart Message (if you have MyChart) OR . A paper copy in the mail If you have any lab test that is abnormal or we need to change your treatment, we will call you to review the results.   Testing/Procedures: - Your physician has requested that you have a carotid duplex. This test is an ultrasound of the carotid arteries in your neck. It looks at blood flow through these arteries that supply the brain with blood. Allow one hour for this exam. There are no restrictions or special instructions.    Follow-Up: At [facility], you and your health needs are our priority.  As part of our continuing mission to provide you with exceptional heart care, we have created designated Provider Care Teams.  These Care Teams include your primary Cardiologist (physician) and Advanced Practice Providers (APPs -  Physician Assistants and Nurse Practitioners) who all work together to provide you with the care you need, when you need it.  We recommend signing up for the patient portal called "MyChart".  Sign up information is provided on this After Visit Summary.  MyChart is used to connect with patients for Virtual Visits (Telemedicine).  Patients are able to view lab/test results, encounter notes, upcoming appointments, etc.  Non-urgent messages can be sent to your provider as well.   To learn more about what you can do with MyChart, go to [email or web address].    Your next appointment:   3 month(s)  The format for your  next appointment:   In Person  Provider:   Julien Nordmann, MD (only)   Other Instructions

## 2020-02-02 LAB — COMPREHENSIVE METABOLIC PANEL
ALT: 13 IU/L (ref 0–32)
AST: 15 IU/L (ref 0–40)
Albumin/Globulin Ratio: 1.1 — ABNORMAL LOW (ref 1.2–2.2)
Albumin: 3.8 g/dL (ref 3.7–4.7)
Alkaline Phosphatase: 152 IU/L — ABNORMAL HIGH (ref 44–121)
BUN/Creatinine Ratio: 24 (ref 12–28)
BUN: 35 mg/dL — ABNORMAL HIGH (ref 8–27)
Bilirubin Total: 0.3 mg/dL (ref 0.0–1.2)
CO2: 27 mmol/L (ref 20–29)
Calcium: 9.6 mg/dL (ref 8.7–10.3)
Chloride: 102 mmol/L (ref 96–106)
Creatinine, Ser: 1.45 mg/dL — ABNORMAL HIGH (ref 0.57–1.00)
GFR calc Af Amer: 40 mL/min/{1.73_m2} — ABNORMAL LOW (ref >59)
GFR calc non Af Amer: 34 mL/min/{1.73_m2} — ABNORMAL LOW (ref >59)
Globulin, Total: 3.5 g/dL (ref 1.5–4.5)
Glucose: 115 mg/dL — ABNORMAL HIGH (ref 65–99)
Potassium: 3.8 mmol/L (ref 3.5–5.2)
Sodium: 141 mmol/L (ref 134–144)
Total Protein: 7.3 g/dL (ref 6.0–8.5)

## 2020-02-02 LAB — LIPID PANEL
Chol/HDL Ratio: 2.8 ratio (ref 0.0–4.4)
Cholesterol, Total: 201 mg/dL — ABNORMAL HIGH (ref 100–199)
HDL: 73 mg/dL (ref >39)
LDL Chol Calc (NIH): 111 mg/dL — ABNORMAL HIGH (ref 0–99)
Triglycerides: 97 mg/dL (ref 0–149)
VLDL Cholesterol Cal: 17 mg/dL (ref 5–40)

## 2020-02-04 ENCOUNTER — Telehealth: Payer: Self-pay | Admitting: *Deleted

## 2020-02-04 DIAGNOSIS — E785 Hyperlipidemia, unspecified: Secondary | ICD-10-CM

## 2020-02-04 DIAGNOSIS — I25118 Atherosclerotic heart disease of native coronary artery with other forms of angina pectoris: Secondary | ICD-10-CM

## 2020-02-04 MED ORDER — REPATHA SURECLICK 140 MG/ML ~~LOC~~ SOAJ: 1.0000 "pen " | SUBCUTANEOUS | 11 refills | Status: DC

## 2020-02-04 NOTE — Telephone Encounter (Signed)
The patient has been notified of the result and verbalized understanding.  All questions (if any) were answered. She is willing to take the Repatha injection every 14 days. She will recruit her family to help her with the injections and ask the pharmacy as well.  She is aware to go to the [facility] in 6-8 weeks (sometime around dec 23 to January 6) for fasting lab work. Rx sent to pharmacy.

## 2020-02-04 NOTE — Telephone Encounter (Signed)
-----  Message from Arvil Chaco, PA-C sent at 02/03/2020  7:16 PM EST ----- Renal function stable from previous labs / since her recent admission at [facility].   Total cholesterol elevated at 201 with LDL 111 and not at goal.  Recommend she start Repatha with repeat lipids /  LFTs in 6-8 weeks, and given her statin intolerance precludes increased dose atorvastatin.    Alk phosphatase elevated. Will send to her PCP as FYI.

## 2020-02-05 ENCOUNTER — Telehealth: Payer: Self-pay

## 2020-02-05 NOTE — Telephone Encounter (Signed)
PA started through covermymeds - Repatha [name] Cooper (Key: [id number]) Rx #: [phone] Repatha SureClick 140MG /ML auto-injectors  Wait for Determination Please wait for [employer] 2017 NCPDP to return a determination.

## 2020-02-08 ENCOUNTER — Other Ambulatory Visit: Payer: Self-pay | Admitting: Cardiovascular Disease

## 2020-02-08 ENCOUNTER — Other Ambulatory Visit: Payer: Self-pay

## 2020-02-08 ENCOUNTER — Ambulatory Visit (INDEPENDENT_AMBULATORY_CARE_PROVIDER_SITE_OTHER): Payer: Medicare Other

## 2020-02-08 DIAGNOSIS — I6523 Occlusion and stenosis of bilateral carotid arteries: Secondary | ICD-10-CM

## 2020-02-08 DIAGNOSIS — R0989 Other specified symptoms and signs involving the circulatory and respiratory systems: Secondary | ICD-10-CM | POA: Diagnosis not present

## 2020-02-08 NOTE — Telephone Encounter (Signed)
PA for Repatha was approved.  Approved through 03/25/2021

## 2020-02-08 NOTE — Progress Notes (Signed)
Cardiology Office Note  Date:  02/09/2020   ID:  [name] Cooper, DOB 10-01-1940, MRN [id number]  PCP:  [name] Mina, MD   Chief Complaint  Patient presents with   Other    Bruising in arms and legs - Meds reviewed verbally with patient.     HPI:  [name] Cooper is a very pleasant 79 year old woman with history of  coronary artery disease,  stent placed to her mid LAD with repeat stenting for in-stent restenosis Over 10 years ago,  hyperlipidemia,  hypertension,  fibromyalgia and diffuse muscle aches and arthritis  who presents for Followup of her coronary artery disease. Moderate bilateral carotid arterial disease   Today she has medication sheets and discharge summaries from [facility] [place] and [facility] Difficulty getting it all straight, long time spent discussing medication list  01/24/2020 went to [facility] 3-4 days of ticking sound in the chest  without chest pain BNP on admission was 416.  SBP up to 230 that came down to 216 after IV labetalol 20 mg.   troponin  flat (42 --> 41 --> 44 --> 41) restarted on her home Hyzaar 100-25 mg daily and BP initially improved to SBPs in the 140-170 range. Patient continued to be asymptomatic without any signs of end organ damage.  Echo demonstrated EF 60-65% and grade III diastolic dysfunction. one dose of IV lasix 20 mg on 10/31  Discharged on Lasix for 3 days  works as a crossing guard, sits in her car if they do not need her Takes asa as needed Plavix daily  Anemia, HGB 8, drop in blood count over the past 2 years Scheduled to see oncology in 2 days time She denies any dark stools  Reports blood pressure well controlled Denies significant leg swelling, wears compression hose  No shortness of breath or chest pain  EKG personally reviewed by myself on todays visit Shows normal sinus rhythm with rate 74 bpm PACs  Other past medical history reviewed  several procedures with Dr. Lorretta Harp 01/2019 left lower extremity for  limb salvage 02/2019 left leg angioplasty and stent placement the SFA  Carotid ultrasound reviewed with her Right Carotid: Velocities in the right ICA are consistent with a 1-39%  stenosis. The ECA appears >50% stenosed.  Left Carotid: Velocities in the left ICA are consistent with a 40-59%  stenosis.  ------She has bilateral 40-59% carotid arterial disease, bruit on the right Last documented 2015  Discussed high cholesterol, Previously Did not want a statin "Had myalgias on crestor and lipitor" Now is taking Lipitor with no symptoms, stopped her Zetia for some reason  Cardiac catheter at [facility] for chest discomfort earlier in the year showed patent LAD stent with no other significant stenoses.    PMH:   has a past medical history of Arthritis, Cholelithiases, CKD (chronic kidney disease), stage II, Coronary artery disease, Fibromyalgia, Frozen shoulder, History of kidney stones, HTN (hypertension), Hyperlipidemia, Iron deficiency, Thrombocytopenia (HCC), UTI (urinary tract infection), and Venous stasis.  PSH:    Past Surgical History:  Procedure Laterality Date   CARDIAC CATHETERIZATION     stents placed in mid LAD   CERVICAL SPINE SURGERY     FETAL BLOOD TRANSFUSION  2014   Frozen Shoulder     KIDNEY STONE SURGERY     LEG SURGERY     broke her leg    LOWER EXTREMITY ANGIOGRAPHY Right 02/17/2019   Procedure: LOWER EXTREMITY ANGIOGRAPHY;  Surgeon: Renford Dills, MD;  Location: [facility] INVASIVE CV LAB;  Service: Cardiovascular;  Laterality: Right;   LOWER EXTREMITY ANGIOGRAPHY Left 03/18/2019   Procedure: LOWER EXTREMITY ANGIOGRAPHY;  Surgeon: Renford Dills, MD;  Location: [facility] INVASIVE CV LAB;  Service: Cardiovascular;  Laterality: Left;    Current Outpatient Medications  Medication Sig Dispense Refill   aspirin EC 81 MG tablet Take 1 tablet (81 mg total) by mouth daily. 150 tablet 2   atorvastatin (LIPITOR) 20 MG tablet Take 1 tablet (20 mg total) by mouth  daily. 90 tablet 3   clopidogrel (PLAVIX) 75 MG tablet Take 1 tablet (75 mg total) by mouth daily. 30 tablet 4   estradiol (ESTRACE) 0.1 MG/GM vaginal cream Apply a pea size amount Monday, Wednesday, and Friday night 42.5 g 12   losartan-hydrochlorothiazide (HYZAAR) 100-25 MG tablet TAKE 1 TABLET BY MOUTH DAILY 90 tablet 0   temazepam (RESTORIL) 30 MG capsule Take 30 mg by mouth at bedtime.      nitrofurantoin, macrocrystal-monohydrate, (MACROBID) 100 MG capsule Take 1 capsule (100 mg total) by mouth daily. (Patient not taking: Reported on 02/09/2020) 90 capsule 0   No current facility-administered medications for this visit.    Allergies:   Sulfa antibiotics   Social History:  The patient  reports that she has never smoked. She has never used smokeless tobacco. She reports that she does not drink alcohol and does not use drugs.   Family History:   family history includes Diabetes in her brother; Heart attack in her father and mother; Heart disease in her brother; Hypertension in her mother; Kidney disease in her brother.    Review of Systems: Review of Systems  Constitutional: Negative.   Respiratory: Positive for shortness of breath.   Cardiovascular: Negative.   Gastrointestinal: Negative.   Musculoskeletal: Negative.        Leg weakness, gait instability, muscle pain in the legs  Neurological: Negative.   Psychiatric/Behavioral: Negative.   All other systems reviewed and are negative.    PHYSICAL EXAM: VS:  BP 124/62 (BP Location: Right Arm, Patient Position: Sitting, Cuff Size: Normal)    Pulse 74    Ht 5\' 7"  (1.702 m)    Wt 183 lb (83 kg)    SpO2 99%    BMI 28.66 kg/m  , BMI Body mass index is 28.66 kg/m. Constitutional:  oriented to person, place, and time. No distress.  HENT:  Head: Grossly normal Eyes:  no discharge. No scleral icterus.  Neck: No JVD, no carotid bruits  Cardiovascular: Regular rate and rhythm, no murmurs appreciated Pulmonary/Chest: Clear to  auscultation bilaterally, no wheezes or rails Abdominal: Soft.  no distension.  no tenderness.  Musculoskeletal: Normal range of motion Neurological:  normal muscle tone. Coordination normal. No atrophy Skin: Skin warm and dry Psychiatric: normal affect, pleasant   Recent Labs: 02/01/2020: ALT 13; BUN 35; Creatinine, Ser 1.45; Potassium 3.8; Sodium 141    Lipid Panel Lab Results  Component Value Date   CHOL 201 (H) 02/01/2020   HDL 73 02/01/2020   LDLCALC 111 (H) 02/01/2020   TRIG 97 02/01/2020      Wt Readings from Last 3 Encounters:  02/09/20 183 lb (83 kg)  02/01/20 185 lb (83.9 kg)  01/15/20 192 lb (87.1 kg)     ASSESSMENT AND PLAN:  Coronary artery disease of native artery of native heart with stable angina pectoris (HCC) - Plan: EKG 12-Lead Currently with no symptoms of angina. No further workup at this time. Continue current medication regimen.  Anemia Etiology unclear, declined in  the past 2 years Scheduled to see oncology/hematology  Bilateral carotid artery stenosis Moderate disease,  Tolerating Lipitor Stressed importance of compliance with her statin, may need to add Zetia  Essential hypertension, benign Blood pressure is well controlled on today's visit. No changes made to the medications.  Etiology of recent blood pressure spike is unclear, possibly medication noncompliance or confusion Well controlled on today's visit  Mixed hyperlipidemia Continue atorvastatin to 20 mg daily Goal LDL less than 70 We have previously recommended Zetia, this is not on her list today  PAD  followed by vascular On Plavix, statin   Total encounter time more than 25 minutes  Greater than 50% was spent in counseling and coordination of care with the patient    Orders Placed This Encounter  Procedures   EKG 12-Lead     Signed, Dossie Arbour, M.D., Ph.D. 02/09/2020  [facility] [place], [place] [phone]

## 2020-02-09 ENCOUNTER — Ambulatory Visit: Payer: Medicare Other | Admitting: Cardiovascular Disease

## 2020-02-09 ENCOUNTER — Encounter: Payer: Self-pay | Admitting: Cardiovascular Disease

## 2020-02-09 VITALS — BP 124/62 | HR 74 | Ht 67.0 in | Wt 183.0 lb

## 2020-02-09 DIAGNOSIS — I70229 Atherosclerosis of native arteries of extremities with rest pain, unspecified extremity: Secondary | ICD-10-CM

## 2020-02-09 DIAGNOSIS — I10 Essential (primary) hypertension: Secondary | ICD-10-CM

## 2020-02-09 DIAGNOSIS — I739 Peripheral vascular disease, unspecified: Secondary | ICD-10-CM | POA: Diagnosis not present

## 2020-02-09 DIAGNOSIS — I25118 Atherosclerotic heart disease of native coronary artery with other forms of angina pectoris: Secondary | ICD-10-CM | POA: Diagnosis not present

## 2020-02-09 DIAGNOSIS — I6523 Occlusion and stenosis of bilateral carotid arteries: Secondary | ICD-10-CM | POA: Diagnosis not present

## 2020-02-09 DIAGNOSIS — E785 Hyperlipidemia, unspecified: Secondary | ICD-10-CM | POA: Diagnosis not present

## 2020-02-09 MED ORDER — LOSARTAN POTASSIUM-HCTZ 100-25 MG PO TABS
1.0000 | ORAL_TABLET | Freq: Every day | ORAL | 3 refills | Status: DC
Start: 2020-02-09 — End: 2021-07-12

## 2020-02-09 MED ORDER — NITROGLYCERIN 0.4 MG SL SUBL: 0.4000 mg | SUBLINGUAL_TABLET | SUBLINGUAL | 3 refills | Status: AC | PRN

## 2020-02-09 NOTE — Patient Instructions (Addendum)
Medication Instructions:  Please take nitro SL as needed for pressure >170  If you need a refill on your cardiac medications before your next appointment, please call your pharmacy.    Lab work: No new labs needed   If you have labs (blood work) drawn today and your tests are completely normal, you will receive your results only by: Marland Kitchen MyChart Message (if you have MyChart) OR . A paper copy in the mail If you have any lab test that is abnormal or we need to change your treatment, we will call you to review the results.   Testing/Procedures: No new testing needed   Follow-Up: At [facility], you and your health needs are our priority.  As part of our continuing mission to provide you with exceptional heart care, we have created designated Provider Care Teams.  These Care Teams include your primary Cardiologist (physician) and Advanced Practice Providers (APPs -  Physician Assistants and Nurse Practitioners) who all work together to provide you with the care you need, when you need it.  You will need a follow up appointment in 6 months with APP/Gunter Conde  . Providers on your designated Care Team:   . Nicolasa Ducking, NP . Eula Listen, PA-C . Marisue Ivan, PA-C  Any Other Special Instructions Will Be Listed Below (If Applicable).  COVID-19 Vaccine Information can be found at: [email or web address].[email or web address] For questions related to vaccine distribution or appointments, please email [email or web address] or call [phone].

## 2020-02-10 ENCOUNTER — Telehealth: Payer: Self-pay | Admitting: Cardiovascular Disease

## 2020-02-10 ENCOUNTER — Telehealth: Payer: Self-pay

## 2020-02-10 DIAGNOSIS — I6529 Occlusion and stenosis of unspecified carotid artery: Secondary | ICD-10-CM

## 2020-02-10 NOTE — Telephone Encounter (Signed)
REFERENCE NUMBER [id number] BATCH [id number]. Please call to discuss denial of PA for Repatha.

## 2020-02-10 NOTE — Telephone Encounter (Signed)
-----   Message from Lennon Alstrom, PA-C sent at 02/09/2020  4:21 PM EST ----- Updated carotid study shows a mild increase in the carotid disease of her right internal carotid artery, which is likely the reason for the bruit (or the sound heard during her clinic visit when listening on the right side of her neck).  The remainder of her carotid study was unchanged from that of her previous 06/08/2019 study.    No medication changes.   We will plan to repeat this carotid study in 1 year.   Continue to recommend ASA, clopidogrel, and statin for risk factor modification.

## 2020-02-10 NOTE — Telephone Encounter (Signed)
Was able to get in contact with pt and review her results of her carotid scan. Pt has no concerns or questions at this time.   Pt understands that she will have another carotid scan in one year.

## 2020-02-11 ENCOUNTER — Inpatient Hospital Stay: Payer: Medicare Other

## 2020-02-11 ENCOUNTER — Inpatient Hospital Stay: Payer: Medicare Other | Attending: Oncology | Admitting: Oncology

## 2020-02-11 VITALS — BP 175/57 | HR 69 | Temp 97.1°F | Resp 16 | Wt 185.5 lb

## 2020-02-11 DIAGNOSIS — D649 Anemia, unspecified: Secondary | ICD-10-CM | POA: Diagnosis not present

## 2020-02-11 DIAGNOSIS — N182 Chronic kidney disease, stage 2 (mild): Secondary | ICD-10-CM | POA: Diagnosis not present

## 2020-02-11 DIAGNOSIS — I2510 Atherosclerotic heart disease of native coronary artery without angina pectoris: Secondary | ICD-10-CM | POA: Insufficient documentation

## 2020-02-11 DIAGNOSIS — R1012 Left upper quadrant pain: Secondary | ICD-10-CM | POA: Diagnosis not present

## 2020-02-11 DIAGNOSIS — M797 Fibromyalgia: Secondary | ICD-10-CM | POA: Diagnosis not present

## 2020-02-11 DIAGNOSIS — I129 Hypertensive chronic kidney disease with stage 1 through stage 4 chronic kidney disease, or unspecified chronic kidney disease: Secondary | ICD-10-CM | POA: Insufficient documentation

## 2020-02-11 LAB — COMPREHENSIVE METABOLIC PANEL
ALT: 21 U/L (ref 0–44)
AST: 25 U/L (ref 15–41)
Albumin: 3.8 g/dL (ref 3.5–5.0)
Alkaline Phosphatase: 125 U/L (ref 38–126)
Anion gap: 11 (ref 5–15)
BUN: 40 mg/dL — ABNORMAL HIGH (ref 8–23)
CO2: 25 mmol/L (ref 22–32)
Calcium: 9 mg/dL (ref 8.9–10.3)
Chloride: 103 mmol/L (ref 98–111)
Creatinine, Ser: 1.6 mg/dL — ABNORMAL HIGH (ref 0.44–1.00)
GFR, Estimated: 33 mL/min — ABNORMAL LOW (ref >60)
Glucose, Bld: 109 mg/dL — ABNORMAL HIGH (ref 70–99)
Potassium: 3.4 mmol/L — ABNORMAL LOW (ref 3.5–5.1)
Sodium: 139 mmol/L (ref 135–145)
Total Bilirubin: 0.5 mg/dL (ref 0.3–1.2)
Total Protein: 8 g/dL (ref 6.5–8.1)

## 2020-02-11 LAB — CBC WITH DIFFERENTIAL/PLATELET
Abs Immature Granulocytes: 0.18 10*3/uL — ABNORMAL HIGH (ref 0.00–0.07)
Basophils Absolute: 0 10*3/uL (ref 0.0–0.1)
Basophils Relative: 0 %
Eosinophils Absolute: 0.1 10*3/uL (ref 0.0–0.5)
Eosinophils Relative: 1 %
HCT: 35.2 % — ABNORMAL LOW (ref 36.0–46.0)
Hemoglobin: 11 g/dL — ABNORMAL LOW (ref 12.0–15.0)
Immature Granulocytes: 1 %
Lymphocytes Relative: 21 %
Lymphs Abs: 2.6 10*3/uL (ref 0.7–4.0)
MCH: 27 pg (ref 26.0–34.0)
MCHC: 31.3 g/dL (ref 30.0–36.0)
MCV: 86.3 fL (ref 80.0–100.0)
Monocytes Absolute: 0.8 10*3/uL (ref 0.1–1.0)
Monocytes Relative: 7 %
Neutro Abs: 8.9 10*3/uL — ABNORMAL HIGH (ref 1.7–7.7)
Neutrophils Relative %: 70 %
Platelets: 279 10*3/uL (ref 150–400)
RBC: 4.08 MIL/uL (ref 3.87–5.11)
RDW: 13.1 % (ref 11.5–15.5)
WBC: 12.6 10*3/uL — ABNORMAL HIGH (ref 4.0–10.5)
nRBC: 0 % (ref 0.0–0.2)

## 2020-02-11 LAB — PROTIME-INR
INR: 1 (ref 0.8–1.2)
Prothrombin Time: 13 seconds (ref 11.4–15.2)

## 2020-02-11 LAB — RETICULOCYTES
Immature Retic Fract: 3.1 % (ref 2.3–15.9)
RBC.: 4.13 MIL/uL (ref 3.87–5.11)
Retic Count, Absolute: 33.5 10*3/uL (ref 19.0–186.0)
Retic Ct Pct: 0.8 % (ref 0.4–3.1)

## 2020-02-11 LAB — SEDIMENTATION RATE: Sed Rate: 80 mm/hr — ABNORMAL HIGH (ref 0–30)

## 2020-02-11 LAB — APTT: aPTT: 32 seconds (ref 24–36)

## 2020-02-11 LAB — FIBRINOGEN: Fibrinogen: 675 mg/dL — ABNORMAL HIGH (ref 210–475)

## 2020-02-11 LAB — TSH: TSH: 2.19 u[IU]/mL (ref 0.350–4.500)

## 2020-02-11 LAB — LACTATE DEHYDROGENASE: LDH: 178 U/L (ref 98–192)

## 2020-02-11 NOTE — Telephone Encounter (Signed)
Spoke to [employer]. They received initial denial. I called [employer] to clarify the fax we received stating Repatha was approved. [employer] stated that two prior authorizations were sent. The first was reopened and updated as it was denied in error. The second one has been approved and that new reference # is [id number]. Called [facility] back to update w/ new reference #, and they state that this has been run through again and now approved and that the pt will be notified.

## 2020-02-12 LAB — ANA COMPREHENSIVE PANEL
Anti JO-1: <0.2 AI (ref 0.0–0.9)
Centromere Ab Screen: <0.2 AI (ref 0.0–0.9)
Chromatin Ab SerPl-aCnc: <0.2 AI (ref 0.0–0.9)
ENA SM Ab Ser-aCnc: <0.2 AI (ref 0.0–0.9)
Ribonucleic Protein: 0.2 AI (ref 0.0–0.9)
SSA (Ro) (ENA) Antibody, IgG: <0.2 AI (ref 0.0–0.9)
SSB (La) (ENA) Antibody, IgG: 1.7 AI — ABNORMAL HIGH (ref 0.0–0.9)
Scleroderma (Scl-70) (ENA) Antibody, IgG: <0.2 AI (ref 0.0–0.9)
ds DNA Ab: 1 IU/mL (ref 0–9)

## 2020-02-12 LAB — HAPTOGLOBIN: Haptoglobin: 181 mg/dL (ref 42–346)

## 2020-02-12 LAB — KAPPA/LAMBDA LIGHT CHAINS
Kappa free light chain: 82.8 mg/L — ABNORMAL HIGH (ref 3.3–19.4)
Kappa, lambda light chain ratio: 1.92 — ABNORMAL HIGH (ref 0.26–1.65)
Lambda free light chains: 43.2 mg/L — ABNORMAL HIGH (ref 5.7–26.3)

## 2020-02-14 NOTE — Progress Notes (Signed)
Hematology/Oncology Consult note [facility]  [phone] [phone]  Patient Care Team: [name], MD as PCP - General (Family Medicine) [name], MD as PCP - Cardiology (Cardiology) Diona Browner Paulino Door, MD as Referring Physician (Orthopedic Surgery) Minna Merritts, MD as Consulting Physician (Cardiology)   Name of the patient: [name] Cooper  [id number]  Aug 15, 1940   Date of visit: 02/14/20  Diagnosis-normocytic anemia likely secondary to chronic kidney disease  Chief complaint/ Reason for visit-routine follow-up of anemia lost to follow-up since 2019  Heme/Onc history: Patient is a 79 year old female who was seen by Dr. Sherrine Maples in the past for evaluation of anemia. Her hemoglobin has been ranging between 9.9-10.8 over the last couple of years. She is also noted to have chronic kidney disease with a creatinine ranging from 1.5-2. 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   Review of blood work from October 2017 was as follows: methylmalonic acid was elevated at 778. TIBC was low and ferritin was elevated at 399. spep showed polyclonal gammopathy. Cbc showed white count of 9.2, H&H of 10.1/30.6 and a platelet count of 297. Reticulocyte count was low at 0.8 in appropriate for the degree of anemia. Normal immunofixation pattern was noted in urine  Patient was started on monthly B12 injections given elevated MMA   Interval history- Patient reports increasing fatigue over the last 1 month.  She was recently found to have significant anemia with a hemoglobin of 8.5.  She had evidence of leg edema and also received Lasix through cardiology.  She reports LUQ abdominal pain which has been ongoing for the last 1 week.  ECOG PS- 1 Pain scale- 3  Review of systems- Review of Systems  Constitutional: Positive for malaise/fatigue. Negative for chills,  fever and weight loss.  HENT: Negative for congestion, ear discharge and nosebleeds.   Eyes: Negative for blurred vision.  Respiratory: Negative for cough, hemoptysis, sputum production, shortness of breath and wheezing.   Cardiovascular: Positive for leg swelling. Negative for chest pain, palpitations, orthopnea and claudication.  Gastrointestinal: Positive for abdominal pain. Negative for blood in stool, constipation, diarrhea, heartburn, melena, nausea and vomiting.  Genitourinary: Negative for dysuria, flank pain, frequency, hematuria and urgency.  Musculoskeletal: Negative for back pain, joint pain and myalgias.  Skin: Negative for rash.  Neurological: Negative for dizziness, tingling, focal weakness, seizures, weakness and headaches.  Endo/Heme/Allergies: Does not bruise/bleed easily.  Psychiatric/Behavioral: Negative for depression and suicidal ideas. The patient does not have insomnia.       Allergies  Allergen Reactions  . Sulfa Antibiotics Anaphylaxis     Past Medical History:  Diagnosis Date  . Arthritis    left hip, left ankle  . Cholelithiases   . CKD (chronic kidney disease), stage II   . Coronary artery disease    a. cardiac cath 01/2011: mid LAD stent patent, mild lumenal irreg's, nl EF 55%, no AS/MR  . Fibromyalgia   . Frozen shoulder   . History of kidney stones   . HTN (hypertension)    controlled with medication;   . Hyperlipidemia    a. statin intolerant  . Iron deficiency   . Thrombocytopenia ([place])   . UTI (urinary tract infection)   . Venous stasis      Past Surgical History:  Procedure Laterality Date  . CARDIAC CATHETERIZATION     stents placed in mid LAD  . CERVICAL  SPINE SURGERY    . FETAL BLOOD TRANSFUSION  2014  . Frozen Shoulder    . KIDNEY STONE SURGERY    . LEG SURGERY     broke her leg   . LOWER EXTREMITY ANGIOGRAPHY Right 02/17/2019   Procedure: LOWER EXTREMITY ANGIOGRAPHY;  Surgeon: Katha Cabal, MD;  Location: [place] [facility];  Service: Cardiovascular;  Laterality: Right;  . LOWER EXTREMITY ANGIOGRAPHY Left 03/18/2019   Procedure: LOWER EXTREMITY ANGIOGRAPHY;  Surgeon: Katha Cabal, MD;  Location: [place] [facility];  Service: Cardiovascular;  Laterality: Left;    Social History   Socioeconomic History  . Marital status: Widowed    Spouse name: Not on file  . Number of children: 3  . Years of education: Not on file  . Highest education level: Bachelor's degree (e.g., BA, AB, BS)  Occupational History  . Not on file  Tobacco Use  . Smoking status: Never Smoker  . Smokeless tobacco: Never Used  Vaping Use  . Vaping Use: Never used  Substance and Sexual Activity  . Alcohol use: No  . Drug use: No  . Sexual activity: Not on file  Other Topics Concern  . Not on file  Social History Narrative  . Not on file   Social Determinants of Health   Financial Resource Strain:   . Difficulty of Paying Living Expenses: Not on file  Food Insecurity:   . Worried About Charity fundraiser in the Last Year: Not on file  . Ran Out of Food in the Last Year: Not on file  Transportation Needs:   . Lack of Transportation (Medical): Not on file  . Lack of Transportation (Non-Medical): Not on file  Physical Activity:   . Days of Exercise per Week: Not on file  . Minutes of Exercise per Session: Not on file  Stress:   . Feeling of Stress : Not on file  Social Connections:   . Frequency of Communication with Friends and Family: Not on file  . Frequency of Social Gatherings with Friends and Family: Not on file  . Attends Religious Services: Not on file  . Active Member of Clubs or Organizations: Not on file  . Attends Archivist Meetings: Not on file  . Marital Status: Not on file  Intimate Partner Violence:   . Fear of Current or Ex-Partner: Not on file  . Emotionally Abused: Not on file  . Physically Abused: Not on file  . Sexually Abused: Not on file    Family History    Problem Relation Age of Onset  . Hypertension Mother   . Heart attack Mother   . Heart attack Father   . Heart disease Brother        h/o CABG  . Diabetes Brother   . Kidney disease Brother   . Kidney cancer Neg Hx   . Bladder Cancer Neg Hx   . Breast cancer Neg Hx      Current Outpatient Medications:  .  aspirin EC 81 MG tablet, Take 1 tablet (81 mg total) by mouth daily as needed (h/a)., Disp: 150 tablet, Rfl: 2 .  atorvastatin (LIPITOR) 20 MG tablet, Take 1 tablet (20 mg total) by mouth daily., Disp: 90 tablet, Rfl: 3 .  clopidogrel (PLAVIX) 75 MG tablet, Take 1 tablet (75 mg total) by mouth daily., Disp: 30 tablet, Rfl: 4 .  estradiol (ESTRACE) 0.1 MG/GM vaginal cream, Apply a pea size amount Monday, Wednesday, and Friday night, Disp:  42.5 g, Rfl: 12 .  losartan-hydrochlorothiazide (HYZAAR) 100-25 MG tablet, Take 1 tablet by mouth daily., Disp: 90 tablet, Rfl: 3 .  nitroGLYCERIN (NITROSTAT) 0.4 MG SL tablet, Place 1 tablet (0.4 mg total) under the tongue every 5 (five) minutes as needed for chest pain., Disp: 25 tablet, Rfl: 3 .  temazepam (RESTORIL) 30 MG capsule, Take 30 mg by mouth at bedtime. , Disp: , Rfl:   Physical exam:  Vitals:   02/11/20 0947  BP: (!) 175/57  Pulse: 69  Resp: 16  Temp: (!) 97.1 F (36.2 C)  TempSrc: Tympanic  SpO2: 100%  Weight: 185 lb 8 oz (84.1 kg)   Physical Exam Constitutional:      Comments: Appears fatigued  HENT:     Head: Normocephalic and atraumatic.  Eyes:     Pupils: Pupils are equal, round, and reactive to light.  Cardiovascular:     Rate and Rhythm: Normal rate and regular rhythm.     Heart sounds: Normal heart sounds.  Pulmonary:     Effort: Pulmonary effort is normal.     Breath sounds: Normal breath sounds.  Abdominal:     General: Bowel sounds are normal.     Palpations: Abdomen is soft.     Comments: Tenderness to palpation in the left upper quadrant.  No palpable splenomegaly  Musculoskeletal:     Cervical back:  Normal range of motion.     Comments: B/l +1 edema  Lymphadenopathy:     Comments: No palpable cervical, supraclavicular, axillary or inguinal adenopathy   Skin:    General: Skin is warm and dry.  Neurological:     Mental Status: She is alert and oriented to person, place, and time.      CMP Latest Ref Rng & Units 02/11/2020  Glucose 70 - 99 mg/dL 109(H)  BUN 8 - 23 mg/dL 40(H)  Creatinine 0.44 - 1.00 mg/dL 1.60(H)  Sodium 135 - 145 mmol/L 139  Potassium 3.5 - 5.1 mmol/L 3.4(L)  Chloride 98 - 111 mmol/L 103  CO2 22 - 32 mmol/L 25  Calcium 8.9 - 10.3 mg/dL 9.0  Total Protein 6.5 - 8.1 g/dL 8.0  Total Bilirubin 0.3 - 1.2 mg/dL 0.5  Alkaline Phos 38 - 126 U/L 125  AST 15 - 41 U/L 25  ALT 0 - 44 U/L 21   CBC Latest Ref Rng & Units 02/11/2020  WBC 4.0 - 10.5 K/uL 12.6(H)  Hemoglobin 12.0 - 15.0 g/dL 11.0(L)  Hematocrit 36 - 46 % 35.2(L)  Platelets 150 - 400 K/uL 279    No images are attached to the encounter.  VAS US CAROTID  Result Date: 02/10/2020 Carotid Arterial Duplex Study Indications:       Carotid artery disease. Risk Factors:      Hypertension, hyperlipidemia, coronary artery disease. Comparison Study:  In 05/2019, a carotid duplex showed a RICA velocity of 150/35                    cm/s and a LICA velocity of 569/79 cm/s. Performing Technologist: Wilkie Aye RVT  Examination Guidelines: A complete evaluation includes B-mode imaging, spectral Doppler, color Doppler, and power Doppler as needed of all accessible portions of each vessel. Bilateral testing is considered an integral part of a complete examination. Limited examinations for reoccurring indications may be performed as noted.  Right Carotid Findings: +----------+--------+--------+--------+------------------+--------+           PSV cm/sEDV cm/sStenosisPlaque DescriptionComments +----------+--------+--------+--------+------------------+--------+ CCA Prox  144  30                                          +----------+--------+--------+--------+------------------+--------+ CCA Distal93      20                                         +----------+--------+--------+--------+------------------+--------+ ICA Prox  154     29                                         +----------+--------+--------+--------+------------------+--------+ ICA Mid   192     52      40-59%  smooth                     +----------+--------+--------+--------+------------------+--------+ ICA Distal154     40                                         +----------+--------+--------+--------+------------------+--------+ ECA       204                                                +----------+--------+--------+--------+------------------+--------+ +----------+--------+-------+----------------+-------------------+           PSV cm/sEDV cmsDescribe        Arm Pressure (mmHG) +----------+--------+-------+----------------+-------------------+ Subclavian219            Multiphasic, [id number]                 +----------+--------+-------+----------------+-------------------+ +---------+--------+--+--------+--+---------+ VertebralPSV cm/s86EDV cm/s15Antegrade +---------+--------+--+--------+--+---------+  Left Carotid Findings: +----------+--------+--------+--------+------------------+--------+           PSV cm/sEDV cm/sStenosisPlaque DescriptionComments +----------+--------+--------+--------+------------------+--------+ CCA Prox  117     27                                         +----------+--------+--------+--------+------------------+--------+ CCA Distal91      17                                         +----------+--------+--------+--------+------------------+--------+ ICA Prox  114     28                                         +----------+--------+--------+--------+------------------+--------+ ICA Mid   180     45      40-59%  smooth                      +----------+--------+--------+--------+------------------+--------+ ICA Distal124     32                                         +----------+--------+--------+--------+------------------+--------+  ECA       91                                                 +----------+--------+--------+--------+------------------+--------+ +----------+--------+--------+----------------+-------------------+           PSV cm/sEDV cm/sDescribe        Arm Pressure (mmHG) +----------+--------+--------+----------------+-------------------+ Subclavian210             Multiphasic, VVO160                 +----------+--------+--------+----------------+-------------------+ +---------+--------+--+--------+--+---------+ VertebralPSV cm/s75EDV cm/s19Antegrade +---------+--------+--+--------+--+---------+   Summary: Right Carotid: Velocities in the right ICA are consistent with a 40-59%                stenosis. The ECA appears >50% stenosed. Left Carotid: Velocities in the left ICA are consistent with a 40-59% stenosis. Vertebrals:  Bilateral vertebral arteries demonstrate antegrade flow. Subclavians: Normal flow hemodynamics were seen in bilateral subclavian              arteries. *See table(s) above for measurements and observations. Suggest follow up study in 12 months. Electronically signed by Larae Grooms MD on 02/10/2020 at 8:16:14 AM.    Final      Assessment and plan- Patient is a 79 y.o. female referred for anemia    Patient's hemoglobin was 11.3 back in February 2019 and drifted down to 8.3 recently on her CBC on 01/25/2020.  Iron studies showed low iron saturation of 13%.  Ferritin was normal at 236 in October 2021.  TIBC was low.  B12 normal at 870 and folate was normal.  Today I will do a complete anemia work-up including a CBC with differential, reticulocyte count, LDH, haptoglobin, myeloma panel, serum free light chains, TSH, ANA comprehensive panel and ESR.  Given her ongoing the left  upper quadrant abdominal pain I will also obtain a abdomen ultrasound.  I will see her back in 2 weeks time to discuss the results of blood work and ultrasound Visit Diagnosis 1. Normocytic anemia   2. LUQ abdominal pain      Dr. Randa Evens, MD, MPH [facility] [phone] 02/14/2020 10:48 AM

## 2020-02-15 ENCOUNTER — Telehealth: Payer: Self-pay

## 2020-02-15 ENCOUNTER — Telehealth: Payer: Self-pay | Admitting: *Deleted

## 2020-02-15 DIAGNOSIS — E785 Hyperlipidemia, unspecified: Secondary | ICD-10-CM

## 2020-02-15 LAB — MULTIPLE MYELOMA PANEL, SERUM
Albumin SerPl Elph-Mcnc: 3.4 g/dL (ref 2.9–4.4)
Albumin/Glob SerPl: 0.9 (ref 0.7–1.7)
Alpha 1: 0.3 g/dL (ref 0.0–0.4)
Alpha2 Glob SerPl Elph-Mcnc: 0.9 g/dL (ref 0.4–1.0)
B-Globulin SerPl Elph-Mcnc: 1.2 g/dL (ref 0.7–1.3)
Gamma Glob SerPl Elph-Mcnc: 1.5 g/dL (ref 0.4–1.8)
Globulin, Total: 3.9 g/dL (ref 2.2–3.9)
IgA: 494 mg/dL — ABNORMAL HIGH (ref 64–422)
IgG (Immunoglobin G), Serum: 1492 mg/dL (ref 586–1602)
IgM (Immunoglobulin M), Srm: 92 mg/dL (ref 26–217)
Total Protein ELP: 7.3 g/dL (ref 6.0–8.5)

## 2020-02-15 MED ORDER — EZETIMIBE 10 MG PO TABS: 10.0000 mg | ORAL_TABLET | Freq: Every day | ORAL | 3 refills | Status: DC

## 2020-02-15 NOTE — Telephone Encounter (Signed)
-----   Message from Lennon Alstrom, PA-C sent at 02/15/2020  1:14 PM EST ----- Can we check to be certain this patient was able to get Repatha?  If not, can we try Praluent?   I feel like Repatha has not been covered lately for a lot of our patients.  Thank you so much! JV

## 2020-02-15 NOTE — Telephone Encounter (Signed)
Spoke to pt, notified of results and Dr. Windell Hummingbird recc below. Pt verbalized understanding and states she will restart Zetia 10mg  once daily as she was taking before. Rx sent to her pharmacy.

## 2020-02-15 NOTE — Telephone Encounter (Signed)
Called the patient. [email or web address]. 

## 2020-02-15 NOTE — Telephone Encounter (Signed)
-----   Message from Antonieta Iba, MD sent at 02/14/2020 12:20 PM EST ----- Carotid u/s Carotid disease is moderate bilaterally Cholesterol is too high, can we add zetia back on her med list. It was there before, now gone. [email or web address] it is inexpensive

## 2020-02-16 ENCOUNTER — Ambulatory Visit: Payer: Medicare Other | Attending: Oncology

## 2020-02-17 ENCOUNTER — Ambulatory Visit: Payer: Medicare Other | Admitting: Family

## 2020-02-20 ENCOUNTER — Ambulatory Visit
Admission: EM | Admit: 2020-02-20 | Discharge: 2020-02-20 | Disposition: A | Discharge status: Home / Self Care | Payer: Medicare Other | Attending: Internal Medicine | Admitting: Internal Medicine

## 2020-02-20 ENCOUNTER — Other Ambulatory Visit: Payer: Self-pay

## 2020-02-20 ENCOUNTER — Encounter: Payer: Self-pay | Admitting: Emergency Medicine

## 2020-02-20 DIAGNOSIS — L03116 Cellulitis of left lower limb: Secondary | ICD-10-CM | POA: Diagnosis not present

## 2020-02-20 DIAGNOSIS — S8012XA Contusion of left lower leg, initial encounter: Secondary | ICD-10-CM

## 2020-02-20 MED ORDER — CEPHALEXIN 500 MG PO CAPS: 500.0000 mg | ORAL_CAPSULE | Freq: Three times a day (TID) | ORAL | 0 refills | Status: AC

## 2020-02-20 NOTE — ED Provider Notes (Signed)
[facility]    CSN: [id number] Arrival date & time: 02/20/20  1307      History   Chief Complaint Chief Complaint  Patient presents with  . Fall    HPI [name] Cooper is a 79 y.o. female who fell when she tried to get into a chair 4 days ago and injured her L shin on the bed rail. She ice it and elevated it , and noticed the blooddy blister she has on top of it right after the injury. The area is painful to touch, but not to walk. She is here with a friend. Her son and friend are concerned because the area looks red and is a hot and worry about an infection.   Past Medical History:  Diagnosis Date  . Arthritis    left hip, left ankle  . Cholelithiases   . CKD (chronic kidney disease), stage II   . Coronary artery disease    a. cardiac cath 01/2011: mid LAD stent patent, mild lumenal irreg's, nl EF 55%, no AS/MR  . Fibromyalgia   . Frozen shoulder   . History of kidney stones   . HTN (hypertension)    controlled with medication;   . Hyperlipidemia    a. statin intolerant  . Iron deficiency   . Thrombocytopenia (H[facility])   . UTI (urinary tract infection)   . Venous stasis     Patient Active Problem List   Diagnosis Date Noted  . Atherosclerosis of native arteries of extremity with intermittent claudication (HCC) 01/08/2020  . Memory loss or impairment 05/11/2019  . Atherosclerosis of native arteries of extremity with rest pain (HCC) 02/01/2019  . Laceration of right lower leg 11/11/2015  . Osteoarthritis of knee 03/23/2015  . Muscle tenderness 12/31/2014  . Atrophic vaginitis 09/16/2014  . Recurrent UTI 09/16/2014  . Microscopic hematuria 09/16/2014  . Degenerative arthritis of hip 05/03/2014  . Bursitis, trochanteric 05/03/2014  . Achilles tendinitis 04/14/2014  . 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 DDD (degenerative disc disease), lumbar 04/14/2014  . L-S radiculopathy 04/14/2014  . Anemia 10/19/2013  . Coronary artery disease  of native artery of native heart with stable angina pectoris (HCC) 10/19/2013  . BP (high blood pressure) 10/19/2013  . Calculus of kidney 10/19/2013  . 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 Impaired renal function 10/19/2013  . Change in blood platelet count 10/19/2013  . Stasis, venous 10/19/2013  . Thrombocytopenia (HCC) 10/19/2013  . Angina pectoris, nocturnal (HCC) 04/24/2013  . 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 Carotid artery stenosis 01/22/2011  . Chest pain 07/24/2010  . Hyperlipidemia 05/17/2009  . Essential hypertension, benign 05/17/2009  . Coronary atherosclerosis 05/17/2009  . CAROTID BRUIT 05/17/2009  . 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    Past Surgical History:  Procedure Laterality Date  . CARDIAC CATHETERIZATION     stents placed in mid LAD  . CERVICAL SPINE SURGERY    . FETAL BLOOD TRANSFUSION  2014  . Frozen Shoulder    . KIDNEY STONE SURGERY    . LEG SURGERY     broke her leg   . LOWER EXTREMITY ANGIOGRAPHY Right 02/17/2019   Procedure: LOWER EXTREMITY ANGIOGRAPHY;  Surgeon: Renford Dills, MD;  Location: [facility];  Service: Cardiovascular;  Laterality: Right;  . LOWER EXTREMITY ANGIOGRAPHY Left 03/18/2019   Procedure: LOWER EXTREMITY ANGIOGRAPHY;  Surgeon: Renford Dills, MD;  Location: [facility];  Service: Cardiovascular;  Laterality: Left;    OB History   No obstetric history on file.      Home Medications    Prior to Admission medications   Medication Sig Start Date End Date Taking? Authorizing Provider  aspirin EC 81 MG tablet Take 1 tablet (81 mg total) by mouth daily as needed (h/a). 02/09/20  Yes Gollan, Tollie Pizza, MD  atorvastatin (LIPITOR) 20 MG tablet Take 1 tablet (20 mg total) by mouth daily. 06/09/19  Yes Antonieta Iba, MD  clopidogrel (PLAVIX) 75 MG tablet Take 1 tablet (75 mg total) by mouth daily. 02/18/19   Yes Schnier, [name], MD  estradiol (ESTRACE) 0.1 MG/GM vaginal cream Apply a pea size amount Monday, Wednesday, and Friday night 09/29/19  Yes McGowan, Carollee Herter A, PA-C  ezetimibe (ZETIA) 10 MG tablet Take 1 tablet (10 mg total) by mouth daily. 02/15/20 05/15/20 Yes Gollan, Tollie Pizza, MD  losartan-hydrochlorothiazide (HYZAAR) 100-25 MG tablet Take 1 tablet by mouth daily. 02/09/20  Yes Gollan, Tollie Pizza, MD  nitroGLYCERIN (NITROSTAT) 0.4 MG SL tablet Place 1 tablet (0.4 mg total) under the tongue every 5 (five) minutes as needed for chest pain. 02/09/20  Yes Gollan, Tollie Pizza, MD  temazepam (RESTORIL) 30 MG capsule Take 30 mg by mouth at bedtime.  11/28/16  Yes [provider]    Family History Family History  Problem Relation Age of Onset  . Hypertension Mother   . Heart attack Mother   . Heart attack Father   . Heart disease Brother        h/o CABG  . Diabetes Brother   . Kidney disease Brother   . Kidney cancer Neg Hx   . Bladder Cancer Neg Hx   . Breast cancer Neg Hx     Social History Social History   Tobacco Use  . Smoking status: Never Smoker  . Smokeless tobacco: Never Used  Vaping Use  . Vaping Use: Never used  Substance Use Topics  . Alcohol use: No  . Drug use: No     Allergies   Sulfa antibiotics   Review of Systems Review of Systems  Musculoskeletal: Positive for arthralgias.  Skin: Positive for color change. Negative for wound.  Neurological: Negative for dizziness.  Hematological: Negative for adenopathy.     Physical Exam Triage Vital Signs ED Triage Vitals  Enc Vitals Group     BP 02/20/20 1317 (!) 185/54     Pulse Rate 02/20/20 1317 61     Resp --      Temp 02/20/20 1317 97.7 F (36.5 C)     Temp Source 02/20/20 1317 Oral     SpO2 02/20/20 1317 98 %     Weight --      Height --      Head Circumference --      Peak Flow --      Pain Score 02/20/20 1314 8     Pain Loc --      Pain Edu? --      Excl. in GC? --    No data  found.  Updated Vital Signs BP (!) 185/54 (BP Location: Right Arm)   Pulse 61   Temp 97.7 F (36.5 C) (Oral)   SpO2 98%   Visual Acuity Right Eye Distance:   Left Eye Distance:   Bilateral Distance:    Right Eye Near:   Left Eye Near:    Bilateral Near:     Physical Exam Vitals and nursing  note reviewed.  Constitutional:      General: She is not in acute distress.    Appearance: She is obese. She is not toxic-appearing.  HENT:     Right Ear: External ear normal.     Left Ear: External ear normal.  Eyes:     General: No scleral icterus.    Conjunctiva/sclera: Conjunctivae normal.  Pulmonary:     Effort: Pulmonary effort is normal.  Musculoskeletal:        General: Signs of injury present. Normal range of motion.     Cervical back: Neck supple.     Right lower leg: No edema.     Left lower leg: No edema.     Comments: L LOWER LEG- with ecchymotic area on anterior lower shin with a couple of blisters over the skin. Has mild warmth on this area. There is moderate erythema around the ecchymotic area which is very swollen, seems she has a hematoma. No streaking noted.  She does not have pain when squeezing her tibia/fibula bones  Skin:    General: Skin is warm and dry.     Findings: Bruising and erythema present. No rash.  Neurological:     Mental Status: She is alert.  Psychiatric:        Mood and Affect: Mood normal.        Behavior: Behavior normal.        Thought Content: Thought content normal.        Judgment: Judgment normal.      UC Treatments / Results  Labs (all labs ordered are listed, but only abnormal results are displayed) Labs Reviewed - No data to display  EKG   Radiology No results found.  Procedures Procedures (including critical care time)  Medications Ordered in UC Medications - No data to display  Initial Impression / Assessment and Plan / UC Course  I have reviewed the triage vital signs and the nursing notes. Contusion lower leg  with hematoma and mild cellulitis. I placed a sterile 4x4 gauze and ace bandage over it in case the bloody blister bursts. Needs to elevate leg and Fu with PCP next week.  Final Clinical Impressions(s) / UC Diagnoses   Final diagnoses:  None   Discharge Instructions   None    ED Prescriptions    None     PDMP not reviewed this encounter.   Garey Ham, PA-C 02/20/20 1657

## 2020-02-20 NOTE — Discharge Instructions (Addendum)
You have a large area of blood accumulation where you injured yourself with possible early infection. The keflex should help get rid of the redness and the rest will take up to 1 month to improve Please follow with your family Dr Next week.  Keep the gauze and ace bandage on daily, and change the gauze daily specially if it starts draining.  Elevate your left leg above your heart when you are resting.

## 2020-02-20 NOTE — ED Triage Notes (Signed)
Pt reports fall on Tuesday with left leg injury. Pt states she fell trying to get into a chair. Leg is swollen with dried blood.

## 2020-02-23 NOTE — Telephone Encounter (Signed)
Spoke with patient and she has tried getting this done previously and was not successful. Cost was not attainable for her at that time. Will send back to provider.

## 2020-02-23 NOTE — Telephone Encounter (Signed)
Please refer to the lipid clinic, if agreeable.

## 2020-02-23 NOTE — Telephone Encounter (Signed)
Spoke to pt. She is agreeable to lipid clinic. Referral placed. Notified pt they will contact her to set up appointment.

## 2020-02-29 ENCOUNTER — Encounter: Payer: Self-pay | Admitting: Oncology

## 2020-02-29 ENCOUNTER — Inpatient Hospital Stay: Payer: Medicare Other | Attending: Oncology | Admitting: Oncology

## 2020-02-29 ENCOUNTER — Other Ambulatory Visit: Payer: Self-pay

## 2020-02-29 VITALS — BP 160/59 | HR 73 | Temp 96.9°F | Resp 18 | Wt 183.4 lb

## 2020-02-29 DIAGNOSIS — E785 Hyperlipidemia, unspecified: Secondary | ICD-10-CM | POA: Insufficient documentation

## 2020-02-29 DIAGNOSIS — M797 Fibromyalgia: Secondary | ICD-10-CM | POA: Diagnosis not present

## 2020-02-29 DIAGNOSIS — N182 Chronic kidney disease, stage 2 (mild): Secondary | ICD-10-CM | POA: Diagnosis not present

## 2020-02-29 DIAGNOSIS — Z7982 Long term (current) use of aspirin: Secondary | ICD-10-CM | POA: Insufficient documentation

## 2020-02-29 DIAGNOSIS — Z79899 Other long term (current) drug therapy: Secondary | ICD-10-CM | POA: Insufficient documentation

## 2020-02-29 DIAGNOSIS — N189 Chronic kidney disease, unspecified: Secondary | ICD-10-CM | POA: Diagnosis not present

## 2020-02-29 DIAGNOSIS — D631 Anemia in chronic kidney disease: Secondary | ICD-10-CM | POA: Insufficient documentation

## 2020-02-29 DIAGNOSIS — R700 Elevated erythrocyte sedimentation rate: Secondary | ICD-10-CM | POA: Insufficient documentation

## 2020-02-29 DIAGNOSIS — D649 Anemia, unspecified: Secondary | ICD-10-CM

## 2020-02-29 DIAGNOSIS — M1990 Unspecified osteoarthritis, unspecified site: Secondary | ICD-10-CM | POA: Diagnosis not present

## 2020-02-29 DIAGNOSIS — D890 Polyclonal hypergammaglobulinemia: Secondary | ICD-10-CM | POA: Diagnosis not present

## 2020-02-29 DIAGNOSIS — Z955 Presence of coronary angioplasty implant and graft: Secondary | ICD-10-CM | POA: Insufficient documentation

## 2020-02-29 DIAGNOSIS — I129 Hypertensive chronic kidney disease with stage 1 through stage 4 chronic kidney disease, or unspecified chronic kidney disease: Secondary | ICD-10-CM | POA: Insufficient documentation

## 2020-02-29 DIAGNOSIS — I2510 Atherosclerotic heart disease of native coronary artery without angina pectoris: Secondary | ICD-10-CM | POA: Insufficient documentation

## 2020-02-29 NOTE — Progress Notes (Signed)
Hematology/Oncology Consult note [facility]  [phone] [phone]  Patient Care Team: [name], MD as PCP - General (Family Medicine) Minna Merritts, MD as PCP - Cardiology (Cardiology) Diona Browner Paulino Door, MD as Referring Physician (Orthopedic Surgery) Minna Merritts, MD as Consulting Physician (Cardiology)   Name of the patient: [name] Cooper  [id number]  Feb 20, 1941   Date of visit: 02/29/20  Diagnosis- normocytic anemia likely secondary to chronic kidney disease  Chief complaint/ Reason for visit-discuss results of blood work  Heme/Onc history: Patient is a 79 year old female who was seen by Dr. Sherrine Maples in the past for evaluation of anemia. Her hemoglobin has been ranging between 9.9-10.8 over the last couple of years. She is also noted to have chronic kidney disease with a creatinine ranging from 1.5-2. 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   Review of blood work from October 2017 was as follows: methylmalonic acid was elevated at 778. TIBC was low and ferritin was elevated at 399. spep showed polyclonal gammopathy. Cbc showed white count of 9.2, H&H of 10.1/30.6 and a platelet count of 297. Reticulocyte count was low at 0.8 in appropriate for the degree of anemia. Normal immunofixation pattern was noted in urine  Patient was started on monthly B12 injections given elevated MMA  Results of blood work from 02/11/2020 were as follows: CBC showed white count of 12.6, H&H of 11/35.2 with an MCV of 86 and a platelet count of 279.  Reticulocyte count was mildly low for the degree of anemia at 0.8.  Haptoglobin elevated TSH was normal.  Myeloma panel showed polyclonal increase in immunoglobulins.  Serum free light chain ratio was mildly abnormal at 1.9.  TSH was normal at 2.1.  ESR was elevated at 80.  CMP showed creatinine of 1.6.  ANA  comprehensive panel showed mildly elevated ENA IgG antibody of 1.7.   Interval history-patient had a fall around 02/16/2020 and hit her left leg against the railing of her bed which caused a large hematoma.  Reports that her abdominal pain is presently resolved  ECOG PS- 1-2 Pain scale- 3   Review of systems- Review of Systems  Constitutional: Negative for chills, fever, malaise/fatigue and weight loss.  HENT: Negative for congestion, ear discharge and nosebleeds.   Eyes: Negative for blurred vision.  Respiratory: Negative for cough, hemoptysis, sputum production, shortness of breath and wheezing.   Cardiovascular: Negative for chest pain, palpitations, orthopnea and claudication.  Gastrointestinal: Negative for abdominal pain, blood in stool, constipation, diarrhea, heartburn, melena, nausea and vomiting.  Genitourinary: Negative for dysuria, flank pain, frequency, hematuria and urgency.  Musculoskeletal: Negative for back pain, joint pain and myalgias.       Left leg pain  Skin: Negative for rash.  Neurological: Negative for dizziness, tingling, focal weakness, seizures, weakness and headaches.  Endo/Heme/Allergies: Does not bruise/bleed easily.  Psychiatric/Behavioral: Negative for depression and suicidal ideas. The patient does not have insomnia.      Allergies  Allergen Reactions  . Sulfa Antibiotics Anaphylaxis     Past Medical History:  Diagnosis Date  . Arthritis    left hip, left ankle  . Cholelithiases   . CKD (chronic kidney disease), stage II   . Coronary artery disease    a. cardiac cath 01/2011: mid LAD stent patent, mild lumenal irreg's, nl EF 55%, no AS/MR  . Fibromyalgia   . Frozen shoulder   .  History of kidney stones   . HTN (hypertension)    controlled with medication;   . Hyperlipidemia    a. statin intolerant  . Iron deficiency   . Thrombocytopenia ([place])   . UTI (urinary tract infection)   . Venous stasis      Past Surgical History:    Procedure Laterality Date  . CARDIAC CATHETERIZATION     stents placed in mid LAD  . CERVICAL SPINE SURGERY    . FETAL BLOOD TRANSFUSION  2014  . Frozen Shoulder    . KIDNEY STONE SURGERY    . LEG SURGERY     broke her leg   . LOWER EXTREMITY ANGIOGRAPHY Right 02/17/2019   Procedure: LOWER EXTREMITY ANGIOGRAPHY;  Surgeon: Katha Cabal, MD;  Location: [facility];  Service: Cardiovascular;  Laterality: Right;  . LOWER EXTREMITY ANGIOGRAPHY Left 03/18/2019   Procedure: LOWER EXTREMITY ANGIOGRAPHY;  Surgeon: Katha Cabal, MD;  Location: [facility];  Service: Cardiovascular;  Laterality: Left;    Social History   Socioeconomic History  . Marital status: Widowed    Spouse name: Not on file  . Number of children: 3  . Years of education: Not on file  . Highest education level: Bachelor's degree (e.g., BA, AB, BS)  Occupational History  . Not on file  Tobacco Use  . Smoking status: Never Smoker  . Smokeless tobacco: Never Used  Vaping Use  . Vaping Use: Never used  Substance and Sexual Activity  . Alcohol use: No  . Drug use: No  . Sexual activity: Not on file  Other Topics Concern  . Not on file  Social History Narrative  . Not on file   Social Determinants of Health   Financial Resource Strain:   . Difficulty of Paying Living Expenses: Not on file  Food Insecurity:   . Worried About Charity fundraiser in the Last Year: Not on file  . Ran Out of Food in the Last Year: Not on file  Transportation Needs:   . Lack of Transportation (Medical): Not on file  . Lack of Transportation (Non-Medical): Not on file  Physical Activity:   . Days of Exercise per Week: Not on file  . Minutes of Exercise per Session: Not on file  Stress:   . Feeling of Stress : Not on file  Social Connections:   . Frequency of Communication with Friends and Family: Not on file  . Frequency of Social Gatherings with Friends and Family: Not on file  . Attends  Religious Services: Not on file  . Active Member of Clubs or Organizations: Not on file  . Attends Archivist Meetings: Not on file  . Marital Status: Not on file  Intimate Partner Violence:   . Fear of Current or Ex-Partner: Not on file  . Emotionally Abused: Not on file  . Physically Abused: Not on file  . Sexually Abused: Not on file    Family History  Problem Relation Age of Onset  . Hypertension Mother   . Heart attack Mother   . Heart attack Father   . Heart disease Brother        h/o CABG  . Diabetes Brother   . Kidney disease Brother   . Kidney cancer Neg Hx   . Bladder Cancer Neg Hx   . Breast cancer Neg Hx      Current Outpatient Medications:  .  aspirin EC 81 MG tablet, Take 1 tablet (81  mg total) by mouth daily as needed (h/a)., Disp: 150 tablet, Rfl: 2 .  atorvastatin (LIPITOR) 20 MG tablet, Take 1 tablet (20 mg total) by mouth daily., Disp: 90 tablet, Rfl: 3 .  cephALEXin (KEFLEX) 500 MG capsule, Take 1 capsule (500 mg total) by mouth 3 (three) times daily for 10 days., Disp: 30 capsule, Rfl: 0 .  clopidogrel (PLAVIX) 75 MG tablet, Take 1 tablet (75 mg total) by mouth daily., Disp: 30 tablet, Rfl: 4 .  estradiol (ESTRACE) 0.1 MG/GM vaginal cream, Apply a pea size amount Monday, Wednesday, and Friday night, Disp: 42.5 g, Rfl: 12 .  ezetimibe (ZETIA) 10 MG tablet, Take 1 tablet (10 mg total) by mouth daily., Disp: 90 tablet, Rfl: 3 .  losartan-hydrochlorothiazide (HYZAAR) 100-25 MG tablet, Take 1 tablet by mouth daily., Disp: 90 tablet, Rfl: 3 .  nitroGLYCERIN (NITROSTAT) 0.4 MG SL tablet, Place 1 tablet (0.4 mg total) under the tongue every 5 (five) minutes as needed for chest pain., Disp: 25 tablet, Rfl: 3 .  temazepam (RESTORIL) 30 MG capsule, Take 30 mg by mouth at bedtime. , Disp: , Rfl:   Physical exam:  Vitals:   02/29/20 1034  BP: (!) 160/59  Pulse: 73  Resp: 18  Temp: (!) 96.9 F (36.1 C)  TempSrc: Tympanic  SpO2: 100%  Weight: 183 lb 6.4  oz (83.2 kg)   Physical Exam HENT:     Head: Normocephalic and atraumatic.  Eyes:     Pupils: Pupils are equal, round, and reactive to light.  Cardiovascular:     Rate and Rhythm: Normal rate and regular rhythm.     Heart sounds: Normal heart sounds.  Pulmonary:     Effort: Pulmonary effort is normal.     Breath sounds: Normal breath sounds.  Abdominal:     General: Bowel sounds are normal.     Palpations: Abdomen is soft.  Musculoskeletal:     Cervical back: Normal range of motion.     Comments: There is a large hematoma and soft tissue swelling noted over the left lower extremity from recent fall  Skin:    General: Skin is warm and dry.  Neurological:     Mental Status: She is alert and oriented to person, place, and time.      CMP Latest Ref Rng & Units 02/11/2020  Glucose 70 - 99 mg/dL 109(H)  BUN 8 - 23 mg/dL 40(H)  Creatinine 0.44 - 1.00 mg/dL 1.60(H)  Sodium 135 - 145 mmol/L 139  Potassium 3.5 - 5.1 mmol/L 3.4(L)  Chloride 98 - 111 mmol/L 103  CO2 22 - 32 mmol/L 25  Calcium 8.9 - 10.3 mg/dL 9.0  Total Protein 6.5 - 8.1 g/dL 8.0  Total Bilirubin 0.3 - 1.2 mg/dL 0.5  Alkaline Phos 38 - 126 U/L 125  AST 15 - 41 U/L 25  ALT 0 - 44 U/L 21   CBC Latest Ref Rng & Units 02/11/2020  WBC 4.0 - 10.5 K/uL 12.6(H)  Hemoglobin 12.0 - 15.0 g/dL 11.0(L)  Hematocrit 36 - 46 % 35.2(L)  Platelets 150 - 400 K/uL 279    No images are attached to the encounter.  VAS US CAROTID  Result Date: 02/10/2020 Carotid Arterial Duplex Study Indications:       Carotid artery disease. Risk Factors:      Hypertension, hyperlipidemia, coronary artery disease. Comparison Study:  In 05/2019, a carotid duplex showed a RICA velocity of 150/35  cm/s and a LICA velocity of 812/75 cm/s. Performing Technologist: Wilkie Aye RVT  Examination Guidelines: A complete evaluation includes B-mode imaging, spectral Doppler, color Doppler, and power Doppler as needed of all accessible  portions of each vessel. Bilateral testing is considered an integral part of a complete examination. Limited examinations for reoccurring indications may be performed as noted.  Right Carotid Findings: +----------+--------+--------+--------+------------------+--------+           PSV cm/sEDV cm/sStenosisPlaque DescriptionComments +----------+--------+--------+--------+------------------+--------+ CCA Prox  144     30                                         +----------+--------+--------+--------+------------------+--------+ CCA Distal93      20                                         +----------+--------+--------+--------+------------------+--------+ ICA Prox  154     29                                         +----------+--------+--------+--------+------------------+--------+ ICA Mid   192     52      40-59%  smooth                     +----------+--------+--------+--------+------------------+--------+ ICA Distal154     40                                         +----------+--------+--------+--------+------------------+--------+ ECA       204                                                +----------+--------+--------+--------+------------------+--------+ +----------+--------+-------+----------------+-------------------+           PSV cm/sEDV cmsDescribe        Arm Pressure (mmHG) +----------+--------+-------+----------------+-------------------+ Subclavian219            Multiphasic, [id number]                 +----------+--------+-------+----------------+-------------------+ +---------+--------+--+--------+--+---------+ VertebralPSV cm/s86EDV cm/s15Antegrade +---------+--------+--+--------+--+---------+  Left Carotid Findings: +----------+--------+--------+--------+------------------+--------+           PSV cm/sEDV cm/sStenosisPlaque DescriptionComments +----------+--------+--------+--------+------------------+--------+ CCA Prox  117     27                                          +----------+--------+--------+--------+------------------+--------+ CCA Distal91      17                                         +----------+--------+--------+--------+------------------+--------+ ICA Prox  114     28                                         +----------+--------+--------+--------+------------------+--------+  ICA Mid   180     45      40-59%  smooth                     +----------+--------+--------+--------+------------------+--------+ ICA Distal124     32                                         +----------+--------+--------+--------+------------------+--------+ ECA       91                                                 +----------+--------+--------+--------+------------------+--------+ +----------+--------+--------+----------------+-------------------+           PSV cm/sEDV cm/sDescribe        Arm Pressure (mmHG) +----------+--------+--------+----------------+-------------------+ Subclavian210             Multiphasic, [id number]                 +----------+--------+--------+----------------+-------------------+ +---------+--------+--+--------+--+---------+ VertebralPSV cm/s75EDV cm/s19Antegrade +---------+--------+--+--------+--+---------+   Summary: Right Carotid: Velocities in the right ICA are consistent with a 40-59%                stenosis. The ECA appears >50% stenosed. Left Carotid: Velocities in the left ICA are consistent with a 40-59% stenosis. Vertebrals:  Bilateral vertebral arteries demonstrate antegrade flow. Subclavians: Normal flow hemodynamics were seen in bilateral subclavian              arteries. *See table(s) above for measurements and observations. Suggest follow up study in 12 months. Electronically signed by Larae Grooms MD on 02/10/2020 at 8:16:14 AM.    Final      Assessment and plan- Patient is a 79 y.o. female referred for normocytic anemia  On repeat blood work  patient's hemoglobin has improved to 11 and there is no reversible cause of anemia seen.  TSH, myeloma panel, haptoglobin, LDH are normal.  She does have an elevated ESR of 80 possibly suggestive of anemia of chronic disease.  She also has a component of chronic kidney disease.  Overall her anemia has remained stable around 11 and she does not require any iron or EPO at this time.  I am inclined to monitor her anemia conservatively with a repeat CBC ferritin and iron studies in 3 months   Visit Diagnosis 1. Normocytic anemia   2. Anemia of chronic renal failure, unspecified CKD stage      Dr. Randa Evens, MD, MPH [facility] [phone] 02/29/2020 4:47 PM

## 2020-03-01 ENCOUNTER — Ambulatory Visit: Payer: Medicare Other

## 2020-04-21 NOTE — Progress Notes (Deleted)
04/22/2020 12:02 PM   [name] Cooper 1940-08-07 [id number]  Referring provider: [name], MD [address] [facility] [place],  [place] [address]  No chief complaint on file.  Urological history 1. rUTI's -Contributing factors of age and postmenopausal status - Positive documented urine cultures  E. Coli UTI resistant to ampicillin, ciprofloxacin, levofloxacin, tetracycline and trimethoprim/sulfa on 07/15/2019  E. Coli UTI resistant to ampicillin, ciprofloxacin, gentamicin, levofloxacin, tetracycline and trimethoprim/sulfa on 08/10/2019  E. Coli UTI resistant to ampicillin, ciprofloxacin, gentamicin and levofloxacin on 11/05/2019  E. Coli UTI resistant to ampicillin, ciprofloxacin, gentamicin, levofloxacin, tetracycline and trimethoprim/sulfa on 09/23/2019  E. Coli UTI resistant to ampicillin, ciprofloxacin, gentamicin and levofloxacin on 11/05/2019  E. Coli UTI resistant to ampicillin, ciprofloxacin, levofloxacin, tetracycline and trimethoprim/sulfa on 11/18/2019  E. Coli UTI resistant to ampicillin, ciprofloxacin, levofloxacin, tetracycline and trimethoprim/sulfa on 12/04/2019  E. coli UTI resistant to ampicillin, ciprofloxacin, gentamicin, levofloxacin, tetracycline and trimethoprim/sulfa on December 23, 2019 - RUS 10/14/2019 bilateral renal cysts, but no hydronephrosis or stones - engaged in UTI preventative strategies - managed with suppressive Macrobid  2. Vaginal atrophy - managed with vaginal estrogen cream Monday, Wednesday and Friday   3. Nephrolithiasis - Noncontrast CT performed in October 2015 noted right renal calculi  4. Bilateral bilateral renal cysts - RUS 09/2019 benign appearing bilateral renal cysts as above, grossly similar to previous studies.   HPI: [name] Cooper is a 80 y.o. female who presents today for a three month follow up.      PMH: Past Medical History:  Diagnosis Date  . Arthritis    left hip, left ankle  .  Cholelithiases   . CKD (chronic kidney disease), stage II   . Coronary artery disease    a. cardiac cath 01/2011: mid LAD stent patent, mild lumenal irreg's, nl EF 55%, no AS/MR  . Fibromyalgia   . Frozen shoulder   . History of kidney stones   . HTN (hypertension)    controlled with medication;   . Hyperlipidemia    a. statin intolerant  . Iron deficiency   . Thrombocytopenia ([place])   . UTI (urinary tract infection)   . Venous stasis     Surgical History: Past Surgical History:  Procedure Laterality Date  . CARDIAC CATHETERIZATION     stents placed in mid LAD  . CERVICAL SPINE SURGERY    . FETAL BLOOD TRANSFUSION  2014  . Frozen Shoulder    . KIDNEY STONE SURGERY    . LEG SURGERY     broke her leg   . LOWER EXTREMITY ANGIOGRAPHY Right 02/17/2019   Procedure: LOWER EXTREMITY ANGIOGRAPHY;  Surgeon: Katha Cabal, MD;  Location: [facility];  Service: Cardiovascular;  Laterality: Right;  . LOWER EXTREMITY ANGIOGRAPHY Left 03/18/2019   Procedure: LOWER EXTREMITY ANGIOGRAPHY;  Surgeon: Katha Cabal, MD;  Location: [facility];  Service: Cardiovascular;  Laterality: Left;    Home Medications:  Allergies as of 04/22/2020      Reactions   Sulfa Antibiotics Anaphylaxis      Medication List       Accurate as of April 21, 2020 12:02 PM. If you have any questions, ask your nurse or doctor.        aspirin EC 81 MG tablet Take 1 tablet (81 mg total) by mouth daily as needed (h/a).   atorvastatin 20 MG tablet Commonly known as: LIPITOR Take 1 tablet (20 mg total) by mouth daily.   clopidogrel 75  MG tablet Commonly known as: Plavix Take 1 tablet (75 mg total) by mouth daily.   estradiol 0.1 MG/GM vaginal cream Commonly known as: ESTRACE Apply a pea size amount Monday, Wednesday, and Friday night   ezetimibe 10 MG tablet Commonly known as: ZETIA Take 1 tablet (10 mg total) by mouth daily.   losartan-hydrochlorothiazide 100-25 MG  tablet Commonly known as: HYZAAR Take 1 tablet by mouth daily.   nitroGLYCERIN 0.4 MG SL tablet Commonly known as: NITROSTAT Place 1 tablet (0.4 mg total) under the tongue every 5 (five) minutes as needed for chest pain.   temazepam 30 MG capsule Commonly known as: RESTORIL Take 30 mg by mouth at bedtime.       Allergies:  Allergies  Allergen Reactions  . Sulfa Antibiotics Anaphylaxis    Family History: Family History  Problem Relation Age of Onset  . Hypertension Mother   . Heart attack Mother   . Heart attack Father   . Heart disease Brother        h/o CABG  . Diabetes Brother   . Kidney disease Brother   . Kidney cancer Neg Hx   . Bladder Cancer Neg Hx   . Breast cancer Neg Hx     Social History:  reports that she has never smoked. She has never used smokeless tobacco. She reports that she does not drink alcohol and does not use drugs.  ROS: For pertinent review of systems please refer to history of present illness  Physical Exam: There were no vitals taken for this visit.  Constitutional:  Well nourished. Alert and oriented, No acute distress. HEENT: [place] AT, moist mucus membranes.  Trachea midline, no masses. Cardiovascular: No clubbing, cyanosis, or edema. Respiratory: Normal respiratory effort, no increased work of breathing. GI: Abdomen is soft, non tender, non distended, no abdominal masses. Liver and spleen not palpable.  No hernias appreciated.  Stool sample for occult testing is not indicated.   GU: No CVA tenderness.  No bladder fullness or masses.  *** external genitalia, *** pubic hair distribution, no lesions.  Normal urethral meatus, no lesions, no prolapse, no discharge.   No urethral masses, tenderness and/or tenderness. No bladder fullness, tenderness or masses. *** vagina mucosa, *** estrogen effect, no discharge, no lesions, *** pelvic support, *** cystocele and *** rectocele noted.  No cervical motion tenderness.  Uterus is freely mobile and  non-fixed.  No adnexal/parametria masses or tenderness noted.  Anus and perineum are without rashes or lesions.   ***  Skin: No rashes, bruises or suspicious lesions. Lymph: No cervical or inguinal adenopathy. Neurologic: Grossly intact, no focal deficits, moving all 4 extremities. Psychiatric: Normal mood and affect.   Laboratory Data: Lab Results  Component Value Date   WBC 12.6 (H) 02/11/2020   HGB 11.0 (L) 02/11/2020   HCT 35.2 (L) 02/11/2020   MCV 86.3 02/11/2020   PLT 279 02/11/2020    Lab Results  Component Value Date   CREATININE 1.60 (H) 02/11/2020    Lab Results  Component Value Date   AST 25 02/11/2020   Lab Results  Component Value Date   ALT 21 02/11/2020   Specimen:  Blood  Ref Range & Units 2 wk ago Comments  Sodium 135 - 145 mmol/L 140    Potassium 3.5 - 5.0 mmol/L 3.6    Chloride 98 - 108 mmol/L 108    Carbon Dioxide (CO2) 21 - 30 mmol/L 21    Urea Nitrogen (BUN) 7 - 20 mg/dL 20  Creatinine 0.4 - 1.0 mg/dL 1.5High    Calcium 8.7 - 10.2 mg/dL 8.7    Phosphorus 2.3 - 4.5 mg/dL 1.6Low    Albumin 3.5 - 4.8 g/dL 2.4Low    Glomerular Filtration Rate (eGFR)  mL/min/1.73sq m 33     Specimen:  Blood  Ref Range & Units 2 wk ago  WBC (White Blood Cell Count) 3.2 - 9.8 x10^9/L 11.6High   Hemoglobin 12.0 - 15.5 g/dL 8.4Low   Hematocrit 35.0 - 45.0 % 28.5Low   Platelets 150 - 450 x10^9/L 220   MCV (Mean Corpuscular Volume) 80 - 98 fL 89   MCH (Mean Corpuscular Hemoglobin) 26.5 - 34.0 pg 26.1Low   MCHC (Mean Corpuscular Hemoglobin Concentration) 31.4 - 36.0 % 29.5Low   RBC (Red Blood Cell Count) 3.77 - 5.16 x10^12/L 3.22Low   RDW-CV (Red Cell Distribution Width) 11.5 - 14.5 % 14.6High   NRBC (Nucleated Red Blood Cell Count) 0 x10^9/L 0.00   NRBC % (Nucleated Red Blood Cell %) % 0.0   MPV (Mean Platelet Volume) 7.2 - 11.7 fL 9.6   Resulting Agency  [facility]  Specimen Collected: 04/02/20 12:19 PM Last Resulted:  04/02/20 12:32 PM  Received From: [place]  Result Received: 04/08/20 11:49 AM   Specimen:  Urine  Ref Range & Units 2 wk ago Comments  Color Colorless, Straw, Light Yellow, Yellow, Dark Yellow Yellow    Clarity Clear SL CloudyAbnormal    Specific Gravity 1.005 - 1.030 1.019    pH, Urine 5.0 - 8.0 5.5    Protein, Urinalysis Negative 1+Abnormal    Glucose, Urinalysis Negative Negative    Ketones, Urinalysis Negative TraceAbnormal    Blood, Urinalysis Negative 1+Abnormal    Nitrite, Urinalysis Negative Negative    Leukocytes, Urinalysis Negative 2+Abnormal    Bilirubin, Urinalysis Negative Negative    Urobilinogen, Urinalysis 0.2 - 1.0 mg/dL 0.2    Red Blood Cells, Urinalysis <=3 /hpf 3    WBC, UA <=5 /hpf 20High    Squamous Epithelial Cells, Urinalysis /hpf 7    Hyaline Casts /lpf 1    I have reviewed the labs.   Pertinent Imaging:  ***  Assessment & Plan:    1. Recurrent UTI's  Asymptomatic at this visit Continue preventative strategies Continue Macrobid 100 mg daily for suppressive therapy She will follow-up in 3 months for symptom recheck                                   2. Vaginal atrophy Continue the vaginal estrogen cream Monday, Wednesday and Friday nights She will follow up in three months for an exam.    3.  Nephrolithiasis Right lower pole stones No intervention warranted at this time                                       No follow-ups on file.  These notes generated with voice recognition software. I apologize for typographical errors.  Zara Council, PA-C  [facility] [address] [place], [place] [address] [phone]

## 2020-04-22 ENCOUNTER — Ambulatory Visit: Payer: Self-pay | Admitting: Urology

## 2020-04-22 ENCOUNTER — Encounter: Payer: Self-pay | Admitting: Urology

## 2020-04-22 DIAGNOSIS — N200 Calculus of kidney: Secondary | ICD-10-CM

## 2020-04-22 DIAGNOSIS — N952 Postmenopausal atrophic vaginitis: Secondary | ICD-10-CM

## 2020-04-22 DIAGNOSIS — N390 Urinary tract infection, site not specified: Secondary | ICD-10-CM
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
## 2020-05-03 ENCOUNTER — Ambulatory Visit: Payer: Medicare Other | Admitting: Cardiovascular Disease

## 2020-06-02 ENCOUNTER — Ambulatory Visit: Payer: Medicare Other | Admitting: Oncology

## 2020-06-02 ENCOUNTER — Other Ambulatory Visit: Payer: Medicare Other

## 2020-06-06 ENCOUNTER — Inpatient Hospital Stay: Payer: Medicare Other | Attending: Oncology

## 2020-06-06 ENCOUNTER — Inpatient Hospital Stay (HOSPITAL_BASED_OUTPATIENT_CLINIC_OR_DEPARTMENT_OTHER): Payer: Medicare Other | Admitting: Oncology

## 2020-06-06 ENCOUNTER — Encounter: Payer: Self-pay | Admitting: Oncology

## 2020-06-06 ENCOUNTER — Other Ambulatory Visit: Payer: Self-pay

## 2020-06-06 VITALS — BP 202/73 | HR 87 | Temp 97.7°F | Resp 16 | Wt 186.6 lb

## 2020-06-06 DIAGNOSIS — D649 Anemia, unspecified: Secondary | ICD-10-CM

## 2020-06-06 DIAGNOSIS — D631 Anemia in chronic kidney disease: Secondary | ICD-10-CM | POA: Insufficient documentation

## 2020-06-06 DIAGNOSIS — Z79899 Other long term (current) drug therapy: Secondary | ICD-10-CM | POA: Diagnosis not present

## 2020-06-06 DIAGNOSIS — N189 Chronic kidney disease, unspecified: Secondary | ICD-10-CM

## 2020-06-06 LAB — CBC WITH DIFFERENTIAL/PLATELET
Abs Immature Granulocytes: 0.03 K/uL (ref 0.00–0.07)
Basophils Absolute: 0 K/uL (ref 0.0–0.1)
Basophils Relative: 0 %
Eosinophils Absolute: 0.2 K/uL (ref 0.0–0.5)
Eosinophils Relative: 2 %
HCT: 29.5 % — ABNORMAL LOW (ref 36.0–46.0)
Hemoglobin: 9.2 g/dL — ABNORMAL LOW (ref 12.0–15.0)
Immature Granulocytes: 0 %
Lymphocytes Relative: 16 %
Lymphs Abs: 1.7 K/uL (ref 0.7–4.0)
MCH: 27.2 pg (ref 26.0–34.0)
MCHC: 31.2 g/dL (ref 30.0–36.0)
MCV: 87.3 fL (ref 80.0–100.0)
Monocytes Absolute: 0.8 K/uL (ref 0.1–1.0)
Monocytes Relative: 8 %
Neutro Abs: 7.7 K/uL (ref 1.7–7.7)
Neutrophils Relative %: 74 %
Platelets: 270 K/uL (ref 150–400)
RBC: 3.38 MIL/uL — ABNORMAL LOW (ref 3.87–5.11)
RDW: 13.8 % (ref 11.5–15.5)
WBC: 10.4 K/uL (ref 4.0–10.5)
nRBC: 0 % (ref 0.0–0.2)

## 2020-06-06 LAB — IRON AND TIBC
Iron: 39 ug/dL (ref 28–170)
Saturation Ratios: 18 % (ref 10.4–31.8)
TIBC: 213 ug/dL — ABNORMAL LOW (ref 250–450)
UIBC: 174 ug/dL

## 2020-06-06 LAB — FERRITIN: Ferritin: 291 ng/mL (ref 11–307)

## 2020-06-06 NOTE — Progress Notes (Signed)
Pt eating and drinking good. Having good BM's. Pt has ulcer on left leg and she had went to [facility] and they did a big work up on pt and she has appt later this month to check on progress of the ulcer. She is still doing cross walk at school every am and every pm

## 2020-06-06 NOTE — Progress Notes (Signed)
Hematology/Oncology Consult note [facility]  [phone] [phone]  Patient Care Team: [name], MD as PCP - General (Family Medicine) Minna Merritts, MD as PCP - Cardiology (Cardiology) Diona Browner Paulino Door, MD as Referring Physician (Orthopedic Surgery) Minna Merritts, MD as Consulting Physician (Cardiology)   Name of the patient: [name] Cooper  [id number]  10/19/1940   Date of visit: 06/06/20  Diagnosis-normocytic anemia likely secondary to chronic kidney disease  Chief complaint/ Reason for visit-routine follow-up of anemia  Heme/Onc history: Patient is a 80 year old female who was seen by Dr. Sherrine Maples in the past for evaluation of anemia. Her hemoglobin has been ranging between 9.9-10.8 over the last couple of years. She is also noted to have chronic kidney disease with a creatinine ranging from 1.5-2. 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   Review of blood work from October 2017 was as follows: methylmalonic acid was elevated at 778. TIBC was low and ferritin was elevated at 399. spep showed polyclonal gammopathy. Cbc showed white count of 9.2, H&H of 10.1/30.6 and a platelet count of 297. Reticulocyte count was low at 0.8 in appropriate for the degree of anemia. Normal immunofixation pattern was noted in urine  Patient was started on monthly B12 injections given elevated MMA  Results of blood work from 02/11/2020 were as follows: CBC showed white count of 12.6, H&H of 11/35.2 with an MCV of 86 and a platelet count of 279.  Reticulocyte count was mildly low for the degree of anemia at 0.8.  Haptoglobin elevated TSH was normal.  Myeloma panel showed polyclonal increase in immunoglobulins.  Serum free light chain ratio was mildly abnormal at 1.9.  TSH was normal at 2.1.  ESR was elevated at 80.  CMP showed creatinine of 1.6.  ANA  comprehensive panel showed mildly elevated ENA IgG antibody of 1.7.  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  She has baseline fatigue.  ECOG PS- 2 Pain scale- 2   Review of systems- Review of Systems  Constitutional: Positive for malaise/fatigue. Negative for chills, fever and weight loss.  HENT: Negative for congestion, ear discharge and nosebleeds.   Eyes: Negative for blurred vision.  Respiratory: Negative for cough, hemoptysis, sputum production, shortness of breath and wheezing.   Cardiovascular: Negative for chest pain, palpitations, orthopnea and claudication.  Gastrointestinal: Negative for abdominal pain, blood in stool, constipation, diarrhea, heartburn, melena, nausea and vomiting.  Genitourinary: Negative for dysuria, flank pain, frequency, hematuria and urgency.  Musculoskeletal: Negative for back pain, joint pain and myalgias.  Skin: Negative for rash.  Neurological: Negative for dizziness, tingling, focal weakness, seizures, weakness and headaches.  Endo/Heme/Allergies: Does not bruise/bleed easily.  Psychiatric/Behavioral: Negative for depression and suicidal ideas. The patient does not have insomnia.       Allergies  Allergen Reactions  . Sulfa Antibiotics Anaphylaxis     Past Medical History:  Diagnosis Date  . Anemia   . Arthritis    left hip, left ankle  . Cholelithiases   . CKD (chronic kidney disease), stage II   . Coronary artery disease    a. cardiac cath 01/2011: mid LAD stent patent, mild lumenal irreg's, nl EF 55%, no AS/MR  . Fibromyalgia   . Frozen shoulder   . History of kidney stones   . HTN (hypertension)  controlled with medication;   . Hyperlipidemia    a. statin intolerant  . Iron deficiency   . Thrombocytopenia ([place])   . Ulcer of left lower extremity, limited to breakdown of skin ([place])   . UTI (urinary tract infection)   . Venous stasis       Past Surgical History:  Procedure Laterality Date  . CARDIAC CATHETERIZATION     stents placed in mid LAD  . CERVICAL SPINE SURGERY    . FETAL BLOOD TRANSFUSION  2014  . Frozen Shoulder    . KIDNEY STONE SURGERY    . LEG SURGERY     broke her leg   . LOWER EXTREMITY ANGIOGRAPHY Right 02/17/2019   Procedure: LOWER EXTREMITY ANGIOGRAPHY;  Surgeon: Katha Cabal, MD;  Location: [place] [facility];  Service: Cardiovascular;  Laterality: Right;  . LOWER EXTREMITY ANGIOGRAPHY Left 03/18/2019   Procedure: LOWER EXTREMITY ANGIOGRAPHY;  Surgeon: Katha Cabal, MD;  Location: [place] [facility];  Service: Cardiovascular;  Laterality: Left;    Social History   Socioeconomic History  . Marital status: Widowed    Spouse name: Not on file  . Number of children: 3  . Years of education: Not on file  . Highest education level: Bachelor's degree (e.g., BA, AB, BS)  Occupational History  . Not on file  Tobacco Use  . Smoking status: Never Smoker  . Smokeless tobacco: Never Used  Vaping Use  . Vaping Use: Never used  Substance and Sexual Activity  . Alcohol use: No  . Drug use: No  . Sexual activity: Not Currently  Other Topics Concern  . Not on file  Social History Narrative  . Not on file   Social Determinants of Health   Financial Resource Strain: Not on file  Food Insecurity: Not on file  Transportation Needs: Not on file  Physical Activity: Not on file  Stress: Not on file  Social Connections: Not on file  Intimate Partner Violence: Not on file    Family History  Problem Relation Age of Onset  . Hypertension Mother   . Heart attack Mother   . Heart attack Father   . Heart disease Brother        h/o CABG  . Diabetes Brother   . Kidney disease Brother   . Kidney cancer Neg Hx   . Bladder Cancer Neg Hx   . Breast cancer Neg Hx      Current Outpatient Medications:  .  aspirin EC 81 MG tablet, Take 1 tablet (81 mg total) by mouth daily as needed  (h/a)., Disp: 150 tablet, Rfl: 2 .  atorvastatin (LIPITOR) 20 MG tablet, Take 1 tablet (20 mg total) by mouth daily., Disp: 90 tablet, Rfl: 3 .  clopidogrel (PLAVIX) 75 MG tablet, Take 1 tablet (75 mg total) by mouth daily., Disp: 30 tablet, Rfl: 4 .  estradiol (ESTRACE) 0.1 MG/GM vaginal cream, Apply a pea size amount Monday, Wednesday, and Friday night, Disp: 42.5 g, Rfl: 12 .  ezetimibe (ZETIA) 10 MG tablet, Take 1 tablet (10 mg total) by mouth daily., Disp: 90 tablet, Rfl: 3 .  losartan-hydrochlorothiazide (HYZAAR) 100-25 MG tablet, Take 1 tablet by mouth daily., Disp: 90 tablet, Rfl: 3 .  nitroGLYCERIN (NITROSTAT) 0.4 MG SL tablet, Place 1 tablet (0.4 mg total) under the tongue every 5 (five) minutes as needed for chest pain., Disp: 25 tablet, Rfl: 3 .  temazepam (RESTORIL) 30 MG capsule, Take 30 mg by mouth at bedtime. ,  Disp: , Rfl:   Physical exam:  Vitals:   06/06/20 1023  BP: (!) 202/73  Pulse: 87  Resp: 16  Temp: 97.7 F (36.5 C)  TempSrc: Tympanic  SpO2: 100%  Weight: 186 lb 9.6 oz (84.6 kg)   Physical Exam Constitutional:      Comments: Appears fatigued  Cardiovascular:     Rate and Rhythm: Normal rate and regular rhythm.     Heart sounds: Normal heart sounds.  Pulmonary:     Effort: Pulmonary effort is normal.     Breath sounds: Normal breath sounds.  Musculoskeletal:     Cervical back: Normal range of motion.     Right lower leg: Edema present.     Left lower leg: Edema present.     Comments: The new superficial ulceration noted over her left lower extremity which appears to be healing well  Skin:    General: Skin is warm and dry.  Neurological:     Mental Status: She is alert and oriented to person, place, and time.      CMP Latest Ref Rng & Units 02/11/2020  Glucose 70 - 99 mg/dL 109(H)  BUN 8 - 23 mg/dL 40(H)  Creatinine 0.44 - 1.00 mg/dL 1.60(H)  Sodium 135 - 145 mmol/L 139  Potassium 3.5 - 5.1 mmol/L 3.4(L)  Chloride 98 - 111 mmol/L 103  CO2 22 -  32 mmol/L 25  Calcium 8.9 - 10.3 mg/dL 9.0  Total Protein 6.5 - 8.1 g/dL 8.0  Total Bilirubin 0.3 - 1.2 mg/dL 0.5  Alkaline Phos 38 - 126 U/L 125  AST 15 - 41 U/L 25  ALT 0 - 44 U/L 21   CBC Latest Ref Rng & Units 06/06/2020  WBC 4.0 - 10.5 K/uL 10.4  Hemoglobin 12.0 - 15.0 g/dL 9.2(L)  Hematocrit 36.0 - 46.0 % 29.5(L)  Platelets 150 - 400 K/uL 270       Assessment and plan- Patient is a 80 y.o. female with normocytic anemia here for routine follow-up  Patient's hemoglobin is down to 9.2 today.  Iron studies show ferritin levels more than 200.She is therefore not clearly iron deficient.  She does have some chronic kidney disease with a creatinine that has remained around 1.6 for the last few years.  She has had a complete peripheral blood anemia work-up done which was essentially unremarkable.  Therefore inclined to treat her anemia as if she has anemia of chronic kidney disease with Retacrit every 3 weeks to keep her hemoglobin between 10 and 11.  Discussed risks and benefits of Retacrit including all but not limited to possible risk of thromboembolic events.  Patient understands and agrees to proceed as planned.  I will see her back in 3 months with CBC ferritin and iron studies and CMP  If hemoglobin fails to improve despite Retacrit we could consider a bone marrow biopsy down the line   Visit Diagnosis 1. Anemia of chronic kidney failure, unspecified stage   2. Erythropoietin (EPO) stimulating agent anemia management patient      Dr. Randa Evens, MD, MPH [facility] at [facility] [phone] 06/06/2020 1:09 PM

## 2020-06-08 ENCOUNTER — Ambulatory Visit: Payer: Self-pay | Admitting: Urology

## 2020-06-09 ENCOUNTER — Other Ambulatory Visit: Payer: Self-pay | Admitting: *Deleted

## 2020-06-09 DIAGNOSIS — D631 Anemia in chronic kidney disease: Secondary | ICD-10-CM

## 2020-06-13 ENCOUNTER — Inpatient Hospital Stay: Payer: Medicare Other

## 2020-06-13 VITALS — BP 168/74 | HR 73

## 2020-06-13 DIAGNOSIS — N189 Chronic kidney disease, unspecified: Secondary | ICD-10-CM

## 2020-06-13 DIAGNOSIS — D631 Anemia in chronic kidney disease: Secondary | ICD-10-CM

## 2020-06-13 LAB — HEMOGLOBIN AND HEMATOCRIT, BLOOD
HCT: 29.3 % — ABNORMAL LOW (ref 36.0–46.0)
Hemoglobin: 9.1 g/dL — ABNORMAL LOW (ref 12.0–15.0)

## 2020-06-13 MED ORDER — EPOETIN ALFA-EPBX 40000 UNIT/ML IJ SOLN
40000.0000 [IU] | INTRAMUSCULAR | Status: DC
  Administered 2020-06-13: 40000 [IU] via SUBCUTANEOUS
  Filled 2020-06-13: qty 1

## 2020-06-14 NOTE — Progress Notes (Signed)
06/15/2020 8:21 AM   [name] Cooper 24-Jun-1940 [id number]  Referring provider: [name], MD [address] [facility] [place],  [place] [address]  Chief Complaint  Patient presents with  . Recurrent UTI   Urological history: 1. rUTI's -contributing factors of age and vaginal atrophy -RUS 10/14/2019 bilateral renal cysts, but no hydronephrosis or stones -managed with nitrofurantoin 100 mg QD -documented positive urine cultures over the last year   E. Coli UTI resistant to ampicillin, ciprofloxacin, levofloxacin, tetracycline and trimethoprim/sulfa on 07/15/2019  E. Coli UTI resistant to ampicillin, ciprofloxacin, gentamicin, levofloxacin, tetracycline and trimethoprim/sulfa on 08/10/2019  E. Coli UTI resistant to ampicillin, ciprofloxacin, gentamicin and levofloxacin on 11/05/2019  E. Coli UTI resistant to ampicillin, ciprofloxacin, gentamicin, levofloxacin, tetracycline and trimethoprim/sulfa on 09/23/2019  E. Coli UTI resistant to ampicillin, ciprofloxacin, gentamicin and levofloxacin on 11/05/2019  E. Coli UTI resistant to ampicillin, ciprofloxacin, levofloxacin, tetracycline and trimethoprim/sulfa on 11/18/2019  E. Coli UTI resistant to ampicillin, ciprofloxacin, levofloxacin, tetracycline and trimethoprim/sulfa on 12/04/2019  E. coli UTI resistant to ampicillin, ciprofloxacin, gentamicin, levofloxacin, tetracycline and trimethoprim/sulfa on December 23, 2019  2. Vaginal atrophy -managed with vaginal estrogen cream three nights weekly  3. Nephrolithiasis -KUB 08/2017 right renal stones  HPI: [name] Cooper is a 80 y.o. female who presents today for a three month follow up.    She states she stopped the suppressive antibiotic and vaginal estrogen cream after she was admitted for lower leg cellulitis in 03/2020.  She has been having dysuria for the last three weeks associated with urgency.  Patient denies any modifying or aggravating factors.  Patient  denies any gross hematuria, dysuria or suprapubic/flank pain.  Patient denies any fevers, chills, nausea or vomiting.   UA 11-30 WBC's and few bacteria.      PVR 0 mL.    She is taking cranberry supplements, but she is not taking pro-biotics or D-mannose.    PMH: Past Medical History:  Diagnosis Date  . Anemia   . Arthritis    left hip, left ankle  . Cholelithiases   . CKD (chronic kidney disease), stage II   . Coronary artery disease    a. cardiac cath 01/2011: mid LAD stent patent, mild lumenal irreg's, nl EF 55%, no AS/MR  . Fibromyalgia   . Frozen shoulder   . History of kidney stones   . HTN (hypertension)    controlled with medication;   . Hyperlipidemia    a. statin intolerant  . Iron deficiency   . Thrombocytopenia ([facility])   . Ulcer of left lower extremity, limited to breakdown of skin ([facility])   . UTI (urinary tract infection)   . Venous stasis     Surgical History: Past Surgical History:  Procedure Laterality Date  . CARDIAC CATHETERIZATION     stents placed in mid LAD  . CERVICAL SPINE SURGERY    . FETAL BLOOD TRANSFUSION  2014  . Frozen Shoulder    . KIDNEY STONE SURGERY    . LEG SURGERY     broke her leg   . LOWER EXTREMITY ANGIOGRAPHY Right 02/17/2019   Procedure: LOWER EXTREMITY ANGIOGRAPHY;  Surgeon: Renford Dills, MD;  Location: [facility] IN[facility];  Service: Cardiovascular;  Laterality: Right;  . LOWER EXTREMITY ANGIOGRAPHY Left 03/18/2019   Procedure: LOWER EXTREMITY ANGIOGRAPHY;  Surgeon: Renford Dills, MD;  Location: [facility];  Service: Cardiovascular;  Laterality: Left;    Home Medications:  Allergies as of 06/15/2020  Reactions   Sulfa Antibiotics Anaphylaxis      Medication List       Accurate as of June 15, 2020 11:59 PM. If you have any questions, ask your nurse or doctor.        aspirin EC 81 MG tablet Take 1 tablet (81 mg total) by mouth daily as needed (h/a).   atorvastatin 20 MG tablet Commonly known  as: LIPITOR Take 1 tablet (20 mg total) by mouth daily.   clopidogrel 75 MG tablet Commonly known as: Plavix Take 1 tablet (75 mg total) by mouth daily.   estradiol 0.1 MG/GM vaginal cream Commonly known as: ESTRACE Apply a pea size amount Monday, Wednesday, and Friday night   ezetimibe 10 MG tablet Commonly known as: ZETIA Take 1 tablet (10 mg total) by mouth daily.   losartan-hydrochlorothiazide 100-25 MG tablet Commonly known as: HYZAAR Take 1 tablet by mouth daily.   nitroGLYCERIN 0.4 MG SL tablet Commonly known as: NITROSTAT Place 1 tablet (0.4 mg total) under the tongue every 5 (five) minutes as needed for chest pain.   temazepam 30 MG capsule Commonly known as: RESTORIL Take 30 mg by mouth at bedtime.       Allergies:  Allergies  Allergen Reactions  . Sulfa Antibiotics Anaphylaxis    Family History: Family History  Problem Relation Age of Onset  . Hypertension Mother   . Heart attack Mother   . Heart attack Father   . Heart disease Brother        h/o CABG  . Diabetes Brother   . Kidney disease Brother   . Kidney cancer Neg Hx   . Bladder Cancer Neg Hx   . Breast cancer Neg Hx     Social History:  reports that she has never smoked. She has never used smokeless tobacco. She reports that she does not drink alcohol and does not use drugs.  ROS: For pertinent review of systems please refer to history of present illness  Physical Exam: BP (!) 192/72   Pulse 86   Ht 5\' 7"  (1.702 m)   Wt 186 lb (84.4 kg)   BMI 29.13 kg/m   Constitutional:  Well nourished. Alert and oriented, No acute distress. HEENT: [place] AT, mask in place.  Trachea midline Cardiovascular: No clubbing, cyanosis, or edema. Respiratory: Normal respiratory effort, no increased work of breathing. Neurologic: Grossly intact, no focal deficits, moving all 4 extremities. Psychiatric: Normal mood and affect.   Laboratory Data: Lab Results  Component Value Date   WBC 10.4 06/06/2020   HGB  9.1 (L) 06/13/2020   HCT 29.3 (L) 06/13/2020   MCV 87.3 06/06/2020   PLT 270 06/06/2020    Lab Results  Component Value Date   CREATININE 1.60 (H) 02/11/2020    Lab Results  Component Value Date   AST 25 02/11/2020   Lab Results  Component Value Date   ALT 21 02/11/2020   Urinalysis Component     Latest Ref Rng & Units 06/15/2020  Specific Gravity, UA     1.005 - 1.030 1.020  pH, UA     5.0 - 7.5 6.5  Color, UA     Yellow Yellow  Appearance Ur     Clear Cloudy (A)  Leukocytes,UA     Negative 1+ (A)  Protein,UA     Negative/Trace 2+ (A)  Glucose, UA     Negative Negative  Ketones, UA     Negative Negative  RBC, UA     Negative  Trace (A)  Bilirubin, UA     Negative Negative  Urobilinogen, Ur     0.2 - 1.0 mg/dL 0.2  Nitrite, UA     Negative Negative  Microscopic Examination      See below:   Component     Latest Ref Rng & Units 06/15/2020  WBC, UA     0 - 5 /hpf 11-30 (A)  RBC     0 - 2 /hpf 0-2  Epithelial Cells (non renal)     0 - 10 /hpf 0-10  Renal Epithel, UA     None seen /hpf 0-10 (A)  Bacteria, UA     None seen/Few Few   I have reviewed the labs.   Pertinent Imaging:  Results for JAHIRA, SWISS (MRN [id number]) as of 06/15/2020 10:37  Ref. Range 06/15/2020 10:19  Scan Result Unknown 82mL    Assessment & Plan:    1. Recurrent UTI's  -Symptomatic at this visit -Restart preventative strategies -UA with pyuria -urine sent for culture - will hold on prescribing antibiotic until the culture is back                                   2. Vaginal atrophy -restart the vaginal estrogen cream Monday, Wednesday and Friday nights  3.  Nephrolithiasis Right lower pole stones No intervention warranted at this time                                       Return for pending urine culture results .  These notes generated with voice recognition software. I apologize for typographical errors.  Michiel Cowboy, PA-C  [facility] [address] [place], [place] [address] [phone]

## 2020-06-15 ENCOUNTER — Ambulatory Visit: Payer: Medicare Other | Admitting: Urology

## 2020-06-15 ENCOUNTER — Encounter: Payer: Self-pay | Admitting: Urology

## 2020-06-15 ENCOUNTER — Other Ambulatory Visit: Payer: Self-pay

## 2020-06-15 VITALS — BP 192/72 | HR 86 | Ht 67.0 in | Wt 186.0 lb

## 2020-06-15 DIAGNOSIS — N390 Urinary tract infection, site not specified: Secondary | ICD-10-CM | POA: Diagnosis not present

## 2020-06-15 DIAGNOSIS — N952 Postmenopausal atrophic vaginitis: Secondary | ICD-10-CM

## 2020-06-15 LAB — BLADDER SCAN AMB NON-IMAGING

## 2020-06-15 NOTE — Patient Instructions (Signed)
Please start taking pro-biotics, D-mannose, increase your water intake, restart vaginal estrogen cream

## 2020-06-16 LAB — URINALYSIS, COMPLETE
Bilirubin, UA: NEGATIVE
Glucose, UA: NEGATIVE
Ketones, UA: NEGATIVE
Nitrite, UA: NEGATIVE
Specific Gravity, UA: 1.02 (ref 1.005–1.030)
Urobilinogen, Ur: 0.2 mg/dL (ref 0.2–1.0)
pH, UA: 6.5 (ref 5.0–7.5)

## 2020-06-16 LAB — MICROSCOPIC EXAMINATION

## 2020-06-17 ENCOUNTER — Other Ambulatory Visit: Payer: Medicare Other

## 2020-06-17 ENCOUNTER — Other Ambulatory Visit: Payer: Self-pay

## 2020-06-17 ENCOUNTER — Other Ambulatory Visit: Payer: Self-pay | Admitting: *Deleted

## 2020-06-17 DIAGNOSIS — N390 Urinary tract infection, site not specified: Secondary | ICD-10-CM

## 2020-06-17 NOTE — Addendum Note (Signed)
Addended by: Sueanne Margarita on: 06/17/2020 09:18 AM   Modules accepted: Orders

## 2020-06-20 ENCOUNTER — Telehealth: Payer: Self-pay | Admitting: Urology

## 2020-06-20 NOTE — Telephone Encounter (Signed)
Pt calling asking about her results from her appt on Friday.  Pt states she isn't feeling well and really would like to know what her results are. Please advise.

## 2020-06-20 NOTE — Telephone Encounter (Signed)
Called to let pt know that her urine culture results are not back yet.

## 2020-06-22 ENCOUNTER — Other Ambulatory Visit: Payer: Self-pay | Admitting: Urology

## 2020-06-22 MED ORDER — NITROFURANTOIN MONOHYD MACRO 100 MG PO CAPS: 100.0000 mg | ORAL_CAPSULE | Freq: Two times a day (BID) | ORAL | 0 refills | Status: DC

## 2020-06-22 NOTE — Telephone Encounter (Signed)
Would you let Mrs. [name] Cooper know that her culture results are likely [place] to be due either Friday or Monday? I've sent Macrobid to her pharmacy in the interim.

## 2020-06-22 NOTE — Telephone Encounter (Signed)
Pt aware.

## 2020-06-22 NOTE — Telephone Encounter (Signed)
Called and spoke with [facility] to check on status of culture which was sent on 06-17-20, status still prelim. Per [facility] they are having trouble identifying the organism and then will need to run sensitivities. They expect the final result to be either Friday or Monday.

## 2020-06-22 NOTE — Telephone Encounter (Signed)
Pt calling again asking for results and medication for her uti symptoms. Pt c/o frequency, burning, hurts really bad, pt states she's going several times every hour, denies fever/chills/nausea/vomiting.

## 2020-06-22 NOTE — Telephone Encounter (Signed)
.  left message to have patient return my call.  

## 2020-06-24 ENCOUNTER — Telehealth: Payer: Self-pay | Admitting: Family Medicine

## 2020-06-24 LAB — CULTURE, URINE COMPREHENSIVE

## 2020-06-24 NOTE — Telephone Encounter (Signed)
Patient notified and voiced understanding.

## 2020-06-24 NOTE — Telephone Encounter (Signed)
-----   Message from Harle Battiest, PA-C sent at 06/24/2020 12:01 PM EDT ----- Please let Mrs. [name] Cooper know that we finally received her final urine culture results.  She does have an infection and it is sensitive to the Macrobid that I had called in for her.  Once she completes her antibiotic, please have her call the office so that we can restart her suppressive therapy.

## 2020-06-29 ENCOUNTER — Other Ambulatory Visit (HOSPITAL_COMMUNITY): Payer: Self-pay | Admitting: Physician Assistant

## 2020-06-29 ENCOUNTER — Other Ambulatory Visit (HOSPITAL_COMMUNITY): Payer: Self-pay

## 2020-06-29 ENCOUNTER — Other Ambulatory Visit: Payer: Self-pay | Admitting: Physician Assistant

## 2020-06-29 DIAGNOSIS — R600 Localized edema: Secondary | ICD-10-CM

## 2020-06-30 ENCOUNTER — Other Ambulatory Visit: Payer: Self-pay

## 2020-06-30 ENCOUNTER — Ambulatory Visit
Admission: RE | Admit: 2020-06-30 | Discharge: 2020-06-30 | Disposition: A | Discharge status: Home / Self Care | Payer: Medicare Other | Source: Ambulatory Visit | Attending: Physician Assistant | Admitting: Physician Assistant

## 2020-06-30 DIAGNOSIS — R600 Localized edema: Secondary | ICD-10-CM | POA: Diagnosis not present

## 2020-07-04 ENCOUNTER — Telehealth (INDEPENDENT_AMBULATORY_CARE_PROVIDER_SITE_OTHER): Payer: Self-pay | Admitting: Vascular Surgery

## 2020-07-04 ENCOUNTER — Ambulatory Visit (INDEPENDENT_AMBULATORY_CARE_PROVIDER_SITE_OTHER): Payer: Medicare Other

## 2020-07-04 ENCOUNTER — Ambulatory Visit (INDEPENDENT_AMBULATORY_CARE_PROVIDER_SITE_OTHER): Payer: Medicare Other | Admitting: Vascular Surgery

## 2020-07-04 NOTE — Telephone Encounter (Signed)
Patient's daughter called stating that her mother is in the early stages of dementia and the cause of her missing her appointment this morning with Dr. Gilda Crease.  Patient is to have LE Art, ABI and a f/u.  Patient called back and was offered an appointment with Sheppard Plumber.  Patient declined and decided to original appointment.

## 2020-07-05 ENCOUNTER — Inpatient Hospital Stay: Payer: Medicare Other | Attending: Oncology

## 2020-07-05 ENCOUNTER — Inpatient Hospital Stay: Payer: Medicare Other

## 2020-07-12 ENCOUNTER — Emergency Department
Admission: EM | Admit: 2020-07-12 | Discharge: 2020-07-12 | Disposition: A | Discharge status: Home / Self Care | Payer: Medicare Other | Attending: Emergency Medicine | Admitting: Emergency Medicine

## 2020-07-12 ENCOUNTER — Other Ambulatory Visit: Payer: Self-pay

## 2020-07-12 ENCOUNTER — Emergency Department: Payer: Medicare Other

## 2020-07-12 DIAGNOSIS — I1310 Hypertensive heart and chronic kidney disease without heart failure, with stage 1 through stage 4 chronic kidney disease, or unspecified chronic kidney disease: Secondary | ICD-10-CM | POA: Diagnosis not present

## 2020-07-12 DIAGNOSIS — W19XXXA Unspecified fall, initial encounter: Secondary | ICD-10-CM

## 2020-07-12 DIAGNOSIS — R519 Headache, unspecified: Secondary | ICD-10-CM | POA: Insufficient documentation

## 2020-07-12 DIAGNOSIS — S92425A Nondisplaced fracture of distal phalanx of left great toe, initial encounter for closed fracture: Secondary | ICD-10-CM | POA: Diagnosis not present

## 2020-07-12 DIAGNOSIS — W010XXA Fall on same level from slipping, tripping and stumbling without subsequent striking against object, initial encounter: Secondary | ICD-10-CM | POA: Insufficient documentation

## 2020-07-12 DIAGNOSIS — Z7982 Long term (current) use of aspirin: Secondary | ICD-10-CM | POA: Insufficient documentation

## 2020-07-12 DIAGNOSIS — I2510 Atherosclerotic heart disease of native coronary artery without angina pectoris: Secondary | ICD-10-CM | POA: Diagnosis not present

## 2020-07-12 DIAGNOSIS — Z7902 Long term (current) use of antithrombotics/antiplatelets: Secondary | ICD-10-CM | POA: Insufficient documentation

## 2020-07-12 DIAGNOSIS — S99922A Unspecified injury of left foot, initial encounter: Secondary | ICD-10-CM | POA: Diagnosis present

## 2020-07-12 DIAGNOSIS — D631 Anemia in chronic kidney disease: Secondary | ICD-10-CM | POA: Insufficient documentation

## 2020-07-12 DIAGNOSIS — R0789 Other chest pain: Secondary | ICD-10-CM

## 2020-07-12 DIAGNOSIS — S2020XA Contusion of thorax, unspecified, initial encounter: Secondary | ICD-10-CM | POA: Diagnosis not present

## 2020-07-12 DIAGNOSIS — Z79899 Other long term (current) drug therapy: Secondary | ICD-10-CM | POA: Insufficient documentation

## 2020-07-12 DIAGNOSIS — N182 Chronic kidney disease, stage 2 (mild): Secondary | ICD-10-CM | POA: Diagnosis not present

## 2020-07-12 LAB — CBC
HCT: 32.9 % — ABNORMAL LOW (ref 36.0–46.0)
Hemoglobin: 10 g/dL — ABNORMAL LOW (ref 12.0–15.0)
MCH: 25 pg — ABNORMAL LOW (ref 26.0–34.0)
MCHC: 30.4 g/dL (ref 30.0–36.0)
MCV: 82.3 fL (ref 80.0–100.0)
Platelets: 303 10*3/uL (ref 150–400)
RBC: 4 MIL/uL (ref 3.87–5.11)
RDW: 14.4 % (ref 11.5–15.5)
WBC: 9.1 10*3/uL (ref 4.0–10.5)
nRBC: 0 % (ref 0.0–0.2)

## 2020-07-12 LAB — BASIC METABOLIC PANEL
Anion gap: 9 (ref 5–15)
BUN: 17 mg/dL (ref 8–23)
CO2: 25 mmol/L (ref 22–32)
Calcium: 8.9 mg/dL (ref 8.9–10.3)
Chloride: 106 mmol/L (ref 98–111)
Creatinine, Ser: 1.19 mg/dL — ABNORMAL HIGH (ref 0.44–1.00)
GFR, Estimated: 47 mL/min — ABNORMAL LOW (ref >60)
Glucose, Bld: 102 mg/dL — ABNORMAL HIGH (ref 70–99)
Potassium: 3.5 mmol/L (ref 3.5–5.1)
Sodium: 140 mmol/L (ref 135–145)

## 2020-07-12 LAB — COOXEMETRY PANEL
Carboxyhemoglobin: 0.6 % (ref 0.5–1.5)
Methemoglobin: 0.4 % (ref 0.0–1.5)
O2 Saturation: 35.8 %
Total hemoglobin: 10.6 g/dL — ABNORMAL LOW (ref 12.0–16.0)
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
## 2020-07-12 NOTE — ED Notes (Addendum)
NAD noted at time of D/C. Pt denies comments/concerns regarding D/C instructions at this time. Verbal consent for D/C obtained at this time.   EDP aware of patient's BP at this time.

## 2020-07-12 NOTE — Discharge Instructions (Addendum)
You have been seen in the emergency department after a fall.  Your work-up shows a fracture of your left great toe.  As we discussed please wear a hard soled shoe.  Please follow-up with your doctor regarding this. Your CT scan does not show any acute traumatic injuries but does show a very slight amount of pulmonary edema which could be related to blood pressure.  There is also a 7 mm right lower lobe lung nodule that we will need a repeat CT scan in 3 to 6 months to ensure that it is not enlarging.  There is also a cyst on the left kidney although this is stable since 2015 it should be followed up with your doctor to ensure no further imaging is required. Return to the emergency department for any shortness of breath worsening chest pain or any other symptom personally concerning to yourself.

## 2020-07-12 NOTE — ED Triage Notes (Signed)
Pt states she tripped and fell a week ago and is having rib/chest pain and left great toe pain.

## 2020-07-12 NOTE — ED Notes (Signed)
Pt c/o fall 1 week ago. C/o pain across bilateral breasts at this time and L great toe. Pt also c/o HA at this time. Pt otherwise A&O x4, NAD noted at this time. Offered for patient to get into bed, pt refused stating she would rather stay in wheelchair.

## 2020-07-14 ENCOUNTER — Other Ambulatory Visit: Payer: Self-pay | Admitting: Family Medicine

## 2020-07-14 ENCOUNTER — Other Ambulatory Visit (HOSPITAL_COMMUNITY): Payer: Self-pay | Admitting: Family Medicine

## 2020-07-14 DIAGNOSIS — R911 Solitary pulmonary nodule: Secondary | ICD-10-CM

## 2020-07-21 ENCOUNTER — Ambulatory Visit (INDEPENDENT_AMBULATORY_CARE_PROVIDER_SITE_OTHER): Payer: Medicare Other | Admitting: Vascular Surgery

## 2020-07-21 ENCOUNTER — Encounter (INDEPENDENT_AMBULATORY_CARE_PROVIDER_SITE_OTHER): Payer: Medicare Other

## 2020-07-25 ENCOUNTER — Other Ambulatory Visit: Payer: Self-pay

## 2020-07-25 ENCOUNTER — Inpatient Hospital Stay: Payer: Medicare Other

## 2020-07-25 ENCOUNTER — Inpatient Hospital Stay: Payer: Medicare Other | Attending: Oncology

## 2020-07-25 VITALS — BP 152/69

## 2020-07-25 DIAGNOSIS — D631 Anemia in chronic kidney disease: Secondary | ICD-10-CM

## 2020-07-25 DIAGNOSIS — N189 Chronic kidney disease, unspecified: Secondary | ICD-10-CM

## 2020-07-25 LAB — HEMOGLOBIN AND HEMATOCRIT, BLOOD
HCT: 32.3 % — ABNORMAL LOW (ref 36.0–46.0)
Hemoglobin: 10.1 g/dL — ABNORMAL LOW (ref 12.0–15.0)

## 2020-07-25 MED ORDER — EPOETIN ALFA-EPBX 40000 UNIT/ML IJ SOLN
40000.0000 [IU] | INTRAMUSCULAR | Status: AC
  Administered 2020-07-25: 40000 [IU] via SUBCUTANEOUS
  Filled 2020-07-25: qty 1

## 2020-08-01 ENCOUNTER — Ambulatory Visit: Payer: Medicare Other | Admitting: Urology

## 2020-08-01 ENCOUNTER — Encounter: Payer: Self-pay | Admitting: Urology

## 2020-08-01 ENCOUNTER — Other Ambulatory Visit: Payer: Self-pay

## 2020-08-01 VITALS — BP 159/69 | HR 69 | Temp 98.3°F | Ht 67.0 in | Wt 186.0 lb

## 2020-08-01 DIAGNOSIS — N952 Postmenopausal atrophic vaginitis: Secondary | ICD-10-CM | POA: Diagnosis not present

## 2020-08-01 DIAGNOSIS — N390 Urinary tract infection, site not specified: Secondary | ICD-10-CM

## 2020-08-01 DIAGNOSIS — R413 Other amnesia: Secondary | ICD-10-CM | POA: Diagnosis not present

## 2020-08-01 MED ORDER — CEFUROXIME AXETIL 500 MG PO TABS
500.0000 mg | ORAL_TABLET | Freq: Two times a day (BID) | ORAL | 0 refills | Status: DC
Start: 2020-08-01 — End: 2020-08-24

## 2020-08-01 NOTE — Progress Notes (Signed)
08/01/2020 2:55 PM   [name] Cooper 1940/09/19 [id number]  Referring provider: [name], MD [address] [facility] [place],  [place] [address]  Chief Complaint  Patient presents with  . Recurrent UTI   Urological history: 1. rUTI's -contributing factors of age and vaginal atrophy -RUS 10/14/2019 bilateral renal cysts, but no hydronephrosis or stones -managed with nitrofurantoin 100 mg QD -documented positive urine cultures over the last year   E. Coli UTI resistant to ampicillin, ciprofloxacin, gentamicin, levofloxacin, tetracycline and trimethoprim/sulfa on 08/10/2019  E. Coli UTI resistant to ampicillin, ciprofloxacin, gentamicin and levofloxacin on 11/05/2019  E. Coli UTI resistant to ampicillin, ciprofloxacin, gentamicin, levofloxacin, tetracycline and trimethoprim/sulfa on 09/23/2019  E. Coli UTI resistant to ampicillin, ciprofloxacin, gentamicin and levofloxacin on 11/05/2019  E. Coli UTI resistant to ampicillin, ciprofloxacin, levofloxacin, tetracycline and trimethoprim/sulfa on 11/18/2019  E. Coli UTI resistant to ampicillin, ciprofloxacin, levofloxacin, tetracycline and trimethoprim/sulfa on 12/04/2019  E. coli UTI resistant to ampicillin, ciprofloxacin, gentamicin, levofloxacin, tetracycline and trimethoprim/sulfa on December 23, 2019  Klebsiella pneumoniae resistant to ampicillin on June 17, 2020  2. Vaginal atrophy -managed with vaginal estrogen cream three nights weekly  3. Nephrolithiasis -KUB 08/2017 right renal stones  4. Bilateral renal cysts -RUS 09/2019 -complex cysts bilaterally but unchanged since 2015  HPI: [name] Cooper is a 80 y.o. female who presents today for an urgent appointment for painful urination.  She is having a difficult time giving her history at today's visit.  She states she was taking a pill, but she ran out of it and her UTI symptoms came back.  She states she has been out of the medication for about one week and  her symptoms came back.    Her symptoms consist of suprapubic pain when urinating and for several minutes after she completes her stream.    She is also experiencing urinary frequency.    Patient denies any modifying or aggravating factors.  Patient denies any gross hematuria, dysuria or suprapubic/flank pain.  Patient denies any fevers, chills, nausea or vomiting.    Her UA > 30 WBC's.  She is taking the cranberry tablets.  She is not taking the probiotics and D-mannose.    PMH: Past Medical History:  Diagnosis Date  . Anemia   . Arthritis    left hip, left ankle  . Cholelithiases   . CKD (chronic kidney disease), stage II   . Coronary artery disease    a. cardiac cath 01/2011: mid LAD stent patent, mild lumenal irreg's, nl EF 55%, no AS/MR  . Fibromyalgia   . Frozen shoulder   . History of kidney stones   . HTN (hypertension)    controlled with medication;   . Hyperlipidemia    a. statin intolerant  . Iron deficiency   . Thrombocytopenia (HCC)   . Ulcer of left lower extremity, limited to breakdown of skin (HCC)   . UTI (urinary tract infection)   . Venous stasis     Surgical History: Past Surgical History:  Procedure Laterality Date  . CARDIAC CATHETERIZATION     stents placed in mid LAD  . CERVICAL SPINE SURGERY    . FETAL BLOOD TRANSFUSION  2014  . Frozen Shoulder    . KIDNEY STONE SURGERY    . LEG SURGERY     broke her leg   . LOWER EXTREMITY ANGIOGRAPHY Right 02/17/2019   Procedure: LOWER EXTREMITY ANGIOGRAPHY;  Surgeon: Renford Dills, MD;  Location: [facility] INVASIVE CV LAB;  Service:  Cardiovascular;  Laterality: Right;  . LOWER EXTREMITY ANGIOGRAPHY Left 03/18/2019   Procedure: LOWER EXTREMITY ANGIOGRAPHY;  Surgeon: Renford Dills, MD;  Location: [facility] INVASIVE CV LAB;  Service: Cardiovascular;  Laterality: Left;    Home Medications:  Allergies as of 08/01/2020      Reactions   Sulfa Antibiotics Anaphylaxis      Medication List       Accurate  as of Aug 01, 2020  2:55 PM. If you have any questions, ask your nurse or doctor.        STOP taking these medications   aspirin EC 81 MG tablet Stopped by: Caedin Mogan, PA-C   nitrofurantoin (macrocrystal-monohydrate) 100 MG capsule Commonly known as: MACROBID Stopped by: Mitsy Owen, PA-C     TAKE these medications   atorvastatin 20 MG tablet Commonly known as: LIPITOR Take 1 tablet (20 mg total) by mouth daily.   cefUROXime 500 MG tablet Commonly known as: CEFTIN Take 1 tablet (500 mg total) by mouth 2 (two) times daily with a meal. Started by: Michiel Cowboy, PA-C   clopidogrel 75 MG tablet Commonly known as: Plavix Take 1 tablet (75 mg total) by mouth daily.   estradiol 0.1 MG/GM vaginal cream Commonly known as: ESTRACE Apply a pea size amount Monday, Wednesday, and Friday night   ezetimibe 10 MG tablet Commonly known as: ZETIA Take 1 tablet (10 mg total) by mouth daily.   losartan-hydrochlorothiazide 100-25 MG tablet Commonly known as: HYZAAR Take 1 tablet by mouth daily.   nitroGLYCERIN 0.4 MG SL tablet Commonly known as: NITROSTAT Place 1 tablet (0.4 mg total) under the tongue every 5 (five) minutes as needed for chest pain.   temazepam 30 MG capsule Commonly known as: RESTORIL Take 30 mg by mouth at bedtime.       Allergies:  Allergies  Allergen Reactions  . Sulfa Antibiotics Anaphylaxis    Family History: Family History  Problem Relation Age of Onset  . Hypertension Mother   . Heart attack Mother   . Heart attack Father   . Heart disease Brother        h/o CABG  . Diabetes Brother   . Kidney disease Brother   . Kidney cancer Neg Hx   . Bladder Cancer Neg Hx   . Breast cancer Neg Hx     Social History:  reports that she has never smoked. She has never used smokeless tobacco. She reports that she does not drink alcohol and does not use drugs.  ROS: For pertinent review of systems please refer to history of present  illness  Physical Exam: BP (!) 159/69   Pulse 69   Temp 98.3 F (36.8 C)   Ht 5\' 7"  (1.702 m)   Wt 186 lb (84.4 kg)   BMI 29.13 kg/m   Constitutional:  Well nourished. Alert and oriented, No acute distress. HEENT: [place] AT, mask in place.  Trachea midline Cardiovascular: No clubbing, cyanosis, or edema. Respiratory: Normal respiratory effort, no increased work of breathing. Neurologic: Grossly intact, no focal deficits, moving all 4 extremities. Psychiatric: Normal mood and affect.   Laboratory Data: Lab Results  Component Value Date   WBC 9.1 07/12/2020   HGB 10.1 (L) 07/25/2020   HCT 32.3 (L) 07/25/2020   MCV 82.3 07/12/2020   PLT 303 07/12/2020    Lab Results  Component Value Date   CREATININE 1.19 (H) 07/12/2020    Lab Results  Component Value Date   AST 25 02/11/2020   Lab  Results  Component Value Date   ALT 21 02/11/2020   Urinalysis Component     Latest Ref Rng & Units 08/01/2020  Specific Gravity, UA     1.005 - 1.030 1.015  pH, UA     5.0 - 7.5 5.5  Color, UA     Yellow Yellow  Appearance Ur     Clear Cloudy (A)  Leukocytes,UA     Negative 1+ (A)  Protein,UA     Negative/Trace Trace (A)  Glucose, UA     Negative Negative  Ketones, UA     Negative Negative  RBC, UA     Negative Trace (A)  Bilirubin, UA     Negative Negative  Urobilinogen, Ur     0.2 - 1.0 mg/dL 0.2  Nitrite, UA     Negative Negative  Microscopic Examination      See below:   Component     Latest Ref Rng & Units 08/01/2020  WBC, UA     0 - 5 /hpf >30 (A)  RBC     0 - 2 /hpf 0-2  Epithelial Cells (non renal)     0 - 10 /hpf 0-10  Renal Epithel, UA     None seen /hpf 0-10 (A)  Bacteria, UA     None seen/Few None seen  I have reviewed the labs.   Pertinent Imaging:  Results for DIORA, BELLIZZI (MRN [id number]) as of 06/15/2020 10:37  Ref. Range 06/15/2020 10:19  Scan Result Unknown 91mL    Assessment & Plan:    1. Recurrent UTI's  -Symptomatic at this  visit -Restart preventative strategies -UA with pyuria -urine sent for culture - will hold on prescribing antibiotic until the culture is back                                   2. Vaginal atrophy -continue the vaginal estrogen cream Monday, Wednesday and Friday nights  3.  Nephrolithiasis Right lower pole stones No intervention warranted at this time                                      4. Memory loss -she is having difficulty remembering what medication she has been taking daily -reviewing her records, all her antibiotics should have been finished -she states her family has also made comments regarding her memory loss and want her to take a test, but she refuses  -she said she took the test one month ago and didn't pass  -I have spoken with her daughter, Sheralyn Boatman, and she has been referred onto neurology -but patient refused  -Sheralyn Boatman is going to call Dr. Webb Silversmith office again for another referral   Return for cysto with any provider for rUTI's .  These notes generated with voice recognition software. I apologize for typographical errors.  Michiel Cowboy, PA-C  [facility] [address] [place], [place] [address] [phone]

## 2020-08-02 LAB — MICROSCOPIC EXAMINATION
Bacteria, UA: NONE SEEN
WBC, UA: >30 /hpf — AB (ref 0–5)

## 2020-08-02 LAB — URINALYSIS, COMPLETE
Bilirubin, UA: NEGATIVE
Glucose, UA: NEGATIVE
Ketones, UA: NEGATIVE
Nitrite, UA: NEGATIVE
Specific Gravity, UA: 1.015 (ref 1.005–1.030)
Urobilinogen, Ur: 0.2 mg/dL (ref 0.2–1.0)
pH, UA: 5.5 (ref 5.0–7.5)

## 2020-08-06 LAB — CULTURE, URINE COMPREHENSIVE

## 2020-08-08 ENCOUNTER — Telehealth: Payer: Self-pay

## 2020-08-08 NOTE — Telephone Encounter (Signed)
Spoke with patient and notified her of message. She states she was started on Ceftin last Monday when culture was sent.l Patient picked up from pharmacy and has been taking 1 tablet twice daily. She states she has a lot of pills left and wants to know how long she should be taking the medication. Reviewing her chart medication was sent in on 08/01/20 Ceftin 500mg  1 tablet 2 times daily #14 no refills. Patient should be completing the course today with this direction. Patient was asked to get the pill bottle and read the label to clarify, she states that the label reads Cefuroxime 500mg  take two tablets twice daily #28. Patient confirms she has only taken 1 tablet twice daily but did miss one day. She was instructed to take one more day of medication and we would contact the pharmacy to notify them of the error in the fill of the script. was notified and spoke with Carollee Herter (pharmacist) to clarify the error on script fill. [facility] states the script was filled as Ceftin 250mg  2 tablets

## 2020-08-08 NOTE — Telephone Encounter (Signed)
Contacted [facility] and spoke with Lurena Joiner (pharmacist) to clarify the error on script fill. Lurena Joiner states the script was filled as Ceftin 250mg  2 tablets twice daily for 14 days # 28. She states that they did not have any 500mg  tablets in stock and had to make a substitution. states that the patient was notified of this and explanation was given that she would need to take two tablets twice daily to equal the 500mg .  This information was relayed to , PAC. She states that patient should take one tablet daily of the Ceftin she has remaining to prevent infection and keep her cysto apt on 08/24/20. Called patient to notify her of this change, no answer left vmail to call back. Contacted patient's daughter per Michiel Cowboy and notified her of the situation and she verbalized understanding. She states that she will notify her mother of this change

## 2020-08-08 NOTE — Telephone Encounter (Signed)
-----   Message from Harle Battiest, PA-C sent at 08/08/2020  8:06 AM EDT ----- Please let [name] Cooper know that her urine culture was positive for infection and she needs to start Ceftin 500 mg, BID x 7 days.

## 2020-08-09 ENCOUNTER — Ambulatory Visit: Payer: Medicare Other | Admitting: Cardiovascular Disease

## 2020-08-10 ENCOUNTER — Encounter (INDEPENDENT_AMBULATORY_CARE_PROVIDER_SITE_OTHER): Payer: Medicare Other

## 2020-08-10 ENCOUNTER — Encounter (INDEPENDENT_AMBULATORY_CARE_PROVIDER_SITE_OTHER): Payer: Self-pay

## 2020-08-10 ENCOUNTER — Ambulatory Visit (INDEPENDENT_AMBULATORY_CARE_PROVIDER_SITE_OTHER): Payer: Medicare Other | Admitting: Nurse Practitioner

## 2020-08-15 ENCOUNTER — Inpatient Hospital Stay: Payer: Medicare Other

## 2020-08-15 DIAGNOSIS — D631 Anemia in chronic kidney disease: Secondary | ICD-10-CM

## 2020-08-15 DIAGNOSIS — N189 Chronic kidney disease, unspecified: Secondary | ICD-10-CM | POA: Diagnosis not present

## 2020-08-15 LAB — HEMOGLOBIN AND HEMATOCRIT, BLOOD
HCT: 36.3 % (ref 36.0–46.0)
Hemoglobin: 11.1 g/dL — ABNORMAL LOW (ref 12.0–15.0)

## 2020-08-18 ENCOUNTER — Other Ambulatory Visit: Payer: Medicare Other | Admitting: Urology

## 2020-08-18 ENCOUNTER — Other Ambulatory Visit: Payer: Self-pay | Admitting: Cardiovascular Disease

## 2020-08-24 ENCOUNTER — Encounter: Payer: Self-pay | Admitting: Urology

## 2020-08-24 ENCOUNTER — Ambulatory Visit: Payer: Medicare Other | Admitting: Urology

## 2020-08-24 ENCOUNTER — Other Ambulatory Visit: Payer: Self-pay

## 2020-08-24 VITALS — BP 165/82 | HR 74 | Ht 67.0 in | Wt 176.0 lb

## 2020-08-24 DIAGNOSIS — N390 Urinary tract infection, site not specified: Secondary | ICD-10-CM

## 2020-08-24 MED ORDER — LIDOCAINE HCL URETHRAL/MUCOSAL 2 % EX GEL
1.0000 "application " | Freq: Once | CUTANEOUS | Status: AC
  Administered 2020-08-24: 1 via URETHRAL

## 2020-08-24 NOTE — Patient Instructions (Signed)
Continue to use estrogen cream 3 times weekly. Begin taking cranberry tablets twice daily.

## 2020-08-24 NOTE — Progress Notes (Signed)
Cystoscopy Procedure Note:  Indication: Recurrent UTIs  After informed consent and discussion of the procedure and its risks, Yilia Defrain was positioned and prepped in the standard fashion. Cystoscopy was performed with a flexible cystoscope.  Atrophic and tight urethra, able to pass scope without dilators.  The urethra, bladder neck and entire bladder was visualized in a standard fashion. The bladder mucosa was grossly normal throughout, no abnormalities on retroflexion.  The ureteral orifices were visualized in their normal location and orientation.  No abnormalities on careful pullback ureteroscopy  Imaging: Renal ultrasound last year with no hydronephrosis or masses  Findings: Normal cystoscopy, tight urethra  Assessment and Plan: Agree with ongoing topical estrogen cream, encouraged to add cranberry tablets twice daily Consider low-dose antibiotic prophylaxis in the future if recurrent infections despite above RTC with Carollee Herter in 3-6 months  Legrand Rams, MD 08/24/2020

## 2020-08-25 NOTE — Progress Notes (Signed)
Cardiology Office Note  Date:  08/26/2020   ID:  [name] Cooper, DOB 1940-06-23, MRN [id number]  PCP:  [name], MD   Chief Complaint  Patient presents with  . 6 month follow up     "doing well." Patient c/o memory loss. Medications reviewed by the patient verbally.     HPI:  [name] Cooper is a very pleasant 80 year old woman with history of  coronary artery disease,  stent placed to her mid LAD with repeat stenting for in-stent restenosis >10 years ago,  hyperlipidemia,  hypertension,  fibromyalgia and diffuse muscle aches and arthritis  who presents for Followup of her coronary artery disease. Moderate bilateral carotid arterial disease   LOV 01/2020  blister when she hit her leg on the side of the bed. Patient has had worsening erythema, pain, swelling, and drainage from the ulcer on her left shin. cellulitis, in [facility] hospital 11 days, hospital records reviewed Angiogram left leg without intervention. Wound debrided and wound vac applied. seen by PCP multiple times for this ulcer, and has completed a course of keflex with some improvement.  Barium swallow showing esophageal dysmotility. EGD showing esophageal candidiasis-prescribed fluconazole for 21 days. hiatal hernia.  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 Lorretta Harp 01/2019 left lower extremity for limb salvage 02/2019 left leg angioplasty and stent placement the SFA  Carotid  ultrasound reviewed with her Right Carotid: Velocities in the right ICA are consistent with a 1-39%  stenosis. The ECA appears >50% stenosed.  Left Carotid: Velocities in the left ICA are consistent with a 40-59%  stenosis.  ------She has bilateral 40-59% carotid arterial disease, bruit on the right Last documented 2015  Discussed high cholesterol, Previously Did not want a statin "Had myalgias on crestor and lipitor" Now is taking Lipitor with no symptoms, stopped her Zetia for some reason  Cardiac catheter at [facility] for chest discomfort earlier in the year showed patent LAD stent with no other significant stenoses.    PMH:   has a past medical history of Anemia, Arthritis, Cholelithiases, CKD (chronic kidney disease), stage II, Coronary artery disease, Fibromyalgia, Frozen shoulder, History of kidney stones, HTN (hypertension), Hyperlipidemia, Iron deficiency, Thrombocytopenia (HCC), Ulcer of left lower extremity, limited to breakdown of skin (HCC), UTI (urinary tract infection), and Venous stasis.  PSH:    Past Surgical History:  Procedure Laterality Date  . CARDIAC CATHETERIZATION     stents placed in mid LAD  . CERVICAL SPINE SURGERY    . FETAL BLOOD TRANSFUSION  2014  . Frozen Shoulder    . KIDNEY STONE SURGERY    . LEG SURGERY     broke her leg   . LOWER EXTREMITY ANGIOGRAPHY Right 02/17/2019   Procedure: LOWER EXTREMITY ANGIOGRAPHY;  Surgeon: Renford Dills, MD;  Location: [facility] INVASIVE CV LAB;  Service: Cardiovascular;  Laterality: Right;  . LOWER EXTREMITY ANGIOGRAPHY Left 03/18/2019   Procedure: LOWER EXTREMITY ANGIOGRAPHY;  Surgeon: Renford Dills, MD;  Location:  [facility] INVASIVE CV LAB;  Service: Cardiovascular;  Laterality: Left;    Current Outpatient Medications  Medication Sig Dispense Refill  . atorvastatin (LIPITOR) 20 MG tablet TAKE 1 TABLET(20 MG) BY MOUTH DAILY 90 tablet 0  . clopidogrel (PLAVIX) 75 MG tablet Take 1 tablet (75 mg total) by mouth daily.  30 tablet 4  . estradiol (ESTRACE) 0.1 MG/GM vaginal cream Apply a pea size amount Monday, Wednesday, and Friday night 42.5 g 12  . ezetimibe (ZETIA) 10 MG tablet Take 1 tablet (10 mg total) by mouth daily. 90 tablet 3  . losartan-hydrochlorothiazide (HYZAAR) 100-25 MG tablet Take 1 tablet by mouth daily. 90 tablet 3  . nitroGLYCERIN (NITROSTAT) 0.4 MG SL tablet Place 1 tablet (0.4 mg total) under the tongue every 5 (five) minutes as needed for chest pain. 25 tablet 3  . temazepam (RESTORIL) 30 MG capsule Take 30 mg by mouth at bedtime.      No current facility-administered medications for this visit.   Facility-Administered Medications Ordered in Other Visits  Medication Dose Route Frequency Provider Last Rate Last Admin  . epoetin alfa-epbx (RETACRIT) injection 40,000 Units  40,000 Units Subcutaneous Q90 days Creig Hines, MD   40,000 Units at 07/25/20 1113    Allergies:   Sulfa antibiotics   Social History:  The patient  reports that she has never smoked. She has never used smokeless tobacco. She reports that she does not drink alcohol and does not use drugs.   Family History:   family history includes Diabetes in her brother; Heart attack in her father and mother; Heart disease in her brother; Hypertension in her mother; Kidney disease in her brother.    Review of Systems: Review of Systems  Constitutional: Negative.   HENT: Negative.   Respiratory: Negative.   Cardiovascular: Negative.   Gastrointestinal: Negative.   Musculoskeletal: Negative.   Neurological: Negative.   Psychiatric/Behavioral: Negative.   All other systems reviewed and are negative.   PHYSICAL EXAM: VS:  BP 140/60 (BP Location: Left Arm, Patient Position: Sitting, Cuff Size: Normal)   Pulse (!) 56   Ht 5\' 6"  (1.676 m)   Wt 178 lb 2 oz (80.8 kg)   SpO2 97%   BMI 28.75 kg/m  , BMI Body mass index is 28.75 kg/m. Constitutional:  oriented to person, place, and time. No distress.  HENT:  Head: Grossly  normal Eyes:  no discharge. No scleral icterus.  Neck: No JVD, no carotid bruits  Cardiovascular: Regular rate and rhythm, no murmurs appreciated Pulmonary/Chest: Clear to auscultation bilaterally, no wheezes or rails Abdominal: Soft.  no distension.  no tenderness.  Musculoskeletal: Normal range of motion Neurological:  normal muscle tone. Coordination normal. No atrophy Skin: Skin warm and dry Psychiatric: normal affect, pleasant   Recent Labs: 02/11/2020: ALT 21; TSH 2.190 07/12/2020: BUN 17; Creatinine, Ser 1.19; Platelets 303; Potassium 3.5; Sodium 140 08/15/2020: Hemoglobin 11.1    Lipid Panel Lab Results  Component Value Date   CHOL 201 (H) 02/01/2020   HDL 73 02/01/2020   LDLCALC 111 (H) 02/01/2020   TRIG 97 02/01/2020      Wt Readings from Last 3 Encounters:  08/26/20 178 lb 2 oz (80.8 kg)  08/24/20 176 lb (79.8 kg)  08/01/20 186 lb (84.4 kg)     ASSESSMENT AND PLAN:  Coronary artery disease of native artery of native heart with stable angina pectoris (HCC) - Plan: EKG 12-Lead Currently with no symptoms of angina. No further workup at this time.  Continue current medication regimen.  Anemia Stable but mildly depressed Followed by hematology oncology  Bilateral carotid artery stenosis Moderate disease,  Tolerating Lipitor and zetia Goal LDL less than 70, levels primary care  Essential hypertension, benign Blood pressure is well controlled on today's visit. No changes made to the medications.  Mixed hyperlipidemia Continue atorvastatin and zetia Goal LDL less than 70  PAD  followed by vascular On Plavix, statin  Leg swelling Venous insufficiency, Recommend compression hose   Total encounter time more than 25 minutes  Greater than 50% was spent in counseling and coordination of care with the patient    No orders of the defined types were placed in this encounter.    Signed, Dossie Arbour, M.D., Ph.D. 08/26/2020  [facility]  [place], [place] [phone]

## 2020-08-26 ENCOUNTER — Other Ambulatory Visit: Payer: Self-pay

## 2020-08-26 ENCOUNTER — Ambulatory Visit: Payer: Medicare Other | Admitting: Cardiovascular Disease

## 2020-08-26 ENCOUNTER — Encounter: Payer: Self-pay | Admitting: Cardiovascular Disease

## 2020-08-26 VITALS — BP 140/60 | HR 56 | Ht 66.0 in | Wt 178.1 lb

## 2020-08-26 DIAGNOSIS — I70229 Atherosclerosis of native arteries of extremities with rest pain, unspecified extremity: Secondary | ICD-10-CM | POA: Diagnosis not present

## 2020-08-26 DIAGNOSIS — I6523 Occlusion and stenosis of bilateral carotid arteries: Secondary | ICD-10-CM

## 2020-08-26 DIAGNOSIS — I25118 Atherosclerotic heart disease of native coronary artery with other forms of angina pectoris: Secondary | ICD-10-CM

## 2020-08-26 DIAGNOSIS — E785 Hyperlipidemia, unspecified: Secondary | ICD-10-CM

## 2020-08-26 DIAGNOSIS — I739 Peripheral vascular disease, unspecified: Secondary | ICD-10-CM | POA: Diagnosis not present

## 2020-08-26 DIAGNOSIS — I10 Essential (primary) hypertension: Secondary | ICD-10-CM

## 2020-08-26 MED ORDER — CLOPIDOGREL BISULFATE 75 MG PO TABS: 75.0000 mg | ORAL_TABLET | Freq: Every day | ORAL | 4 refills | Status: AC

## 2020-08-26 MED ORDER — ATORVASTATIN CALCIUM 20 MG PO TABS: ORAL_TABLET | ORAL | 3 refills | Status: DC

## 2020-08-26 NOTE — Patient Instructions (Signed)
Medication Instructions:  No changes  If you need a refill on your cardiac medications before your next appointment, please call your pharmacy.    Lab work: No new labs needed   If you have labs (blood work) drawn today and your tests are completely normal, you will receive your results only by: . MyChart Message (if you have MyChart) OR . A paper copy in the mail If you have any lab test that is abnormal or we need to change your treatment, we will call you to review the results.   Testing/Procedures: No new testing needed   Follow-Up: At [facility], you and your health needs are our priority.  As part of our continuing mission to provide you with exceptional heart care, we have created designated Provider Care Teams.  These Care Teams include your primary Cardiologist (physician) and Advanced Practice Providers (APPs -  Physician Assistants and Nurse Practitioners) who all work together to provide you with the care you need, when you need it.  . You will need a follow up appointment in 12 months  . Providers on your designated Care Team:   . Christopher Berge, NP . Ryan Dunn, PA-C . Jacquelyn Visser, PA-C  Any Other Special Instructions Will Be Listed Below (If Applicable).  COVID-19 Vaccine Information can be found at: [email or web address] For questions related to vaccine distribution or appointments, please email [email or web address] or call [phone].     

## 2020-09-02 ENCOUNTER — Other Ambulatory Visit: Payer: Self-pay | Admitting: Physician Assistant

## 2020-09-02 ENCOUNTER — Other Ambulatory Visit: Payer: Self-pay | Admitting: Urology

## 2020-09-02 ENCOUNTER — Ambulatory Visit (INDEPENDENT_AMBULATORY_CARE_PROVIDER_SITE_OTHER): Payer: Medicare Other | Admitting: Physician Assistant

## 2020-09-02 ENCOUNTER — Encounter: Payer: Self-pay | Admitting: Physician Assistant

## 2020-09-02 ENCOUNTER — Other Ambulatory Visit: Payer: Self-pay

## 2020-09-02 VITALS — BP 174/73 | HR 69 | Ht 67.0 in | Wt 177.0 lb

## 2020-09-02 DIAGNOSIS — N390 Urinary tract infection, site not specified: Secondary | ICD-10-CM

## 2020-09-02 DIAGNOSIS — R300 Dysuria: Secondary | ICD-10-CM

## 2020-09-02 LAB — URINALYSIS, COMPLETE
Bilirubin, UA: NEGATIVE
Glucose, UA: NEGATIVE
Ketones, UA: NEGATIVE
Nitrite, UA: NEGATIVE
Specific Gravity, UA: 1.02 (ref 1.005–1.030)
Urobilinogen, Ur: 0.2 mg/dL (ref 0.2–1.0)
pH, UA: 5.5 (ref 5.0–7.5)

## 2020-09-02 LAB — MICROSCOPIC EXAMINATION: WBC, UA: >30 /hpf — AB (ref 0–5)

## 2020-09-02 MED ORDER — AMOXICILLIN-POT CLAVULANATE 875-125 MG PO TABS: 1.0000 | ORAL_TABLET | Freq: Two times a day (BID) | ORAL | 0 refills | Status: DC

## 2020-09-02 NOTE — Progress Notes (Signed)
09/02/2020 4:13 PM   [name] Cooper [birth year] [id number]  CC: Chief Complaint  Patient presents with   Dysuria    HPI: [name] Cooper is a 80 y.o. female with PMH recurrent UTI with benign cystoscopy 9 days ago, vaginal atrophy on topical vaginal estrogen cream, and nephrolithiasis who presents today for evaluation of possible UTI.   Today she reports a 1 to 2-day history of dysuria and urinary frequency.  She denies fever, chills, nausea, vomiting, and flank pain.  She continues to use topical vaginal estrogen cream approximately 3 times weekly.  In-office UA today positive for trace intact blood, trace protein, and 3+ leukocyte esterase; urine microscopy with >30 WBCs/HPF, granular casts, and many bacteria.  PMH: Past Medical History:  Diagnosis Date   Anemia    Arthritis    left hip, left ankle   Cholelithiases    CKD (chronic kidney disease), stage II    Coronary artery disease    a. cardiac cath 01/2011: mid LAD stent patent, mild lumenal irreg's, nl EF 55%, no AS/MR   Fibromyalgia    Frozen shoulder    History of kidney stones    HTN (hypertension)    controlled with medication;    Hyperlipidemia    a. statin intolerant   Iron deficiency    Thrombocytopenia (HCC)    Ulcer of left lower extremity, limited to breakdown of skin (HCC)    UTI (urinary tract infection)    Venous stasis     Surgical History: Past Surgical History:  Procedure Laterality Date   CARDIAC CATHETERIZATION     stents placed in mid LAD   CERVICAL SPINE SURGERY     FETAL BLOOD TRANSFUSION  2014   Frozen Shoulder     KIDNEY STONE SURGERY     LEG SURGERY     broke her leg    LOWER EXTREMITY ANGIOGRAPHY Right 02/17/2019   Procedure: LOWER EXTREMITY ANGIOGRAPHY;  Surgeon: Renford Dills, MD;  Location: [facility] INVASIVE CV LAB;  Service: Cardiovascular;  Laterality: Right;   LOWER EXTREMITY ANGIOGRAPHY Left 03/18/2019   Procedure: LOWER EXTREMITY ANGIOGRAPHY;  Surgeon: Renford Dills, MD;  Location: [facility] INVASIVE CV LAB;  Service: Cardiovascular;  Laterality: Left;    Home Medications:  Allergies as of 09/02/2020       Reactions   Sulfa Antibiotics Anaphylaxis        Medication List        Accurate as of September 02, 2020  4:13 PM. If you have any questions, ask your nurse or doctor.          amoxicillin-clavulanate 875-125 MG tablet Commonly known as: AUGMENTIN Take 1 tablet by mouth 2 (two) times daily for 5 days. Started by: Carman Ching, PA-C   atorvastatin 20 MG tablet Commonly known as: LIPITOR TAKE 1 TABLET(20 MG) BY MOUTH DAILY   clopidogrel 75 MG tablet Commonly known as: Plavix Take 1 tablet (75 mg total) by mouth daily.   estradiol 0.1 MG/GM vaginal cream Commonly known as: ESTRACE Apply a pea size amount Monday, Wednesday, and Friday night   ezetimibe 10 MG tablet Commonly known as: ZETIA Take 1 tablet (10 mg total) by mouth daily.   losartan-hydrochlorothiazide 100-25 MG tablet Commonly known as: HYZAAR Take 1 tablet by mouth daily.   nitroGLYCERIN 0.4 MG SL tablet Commonly known as: NITROSTAT Place 1 tablet (0.4 mg total) under the tongue every 5 (five) minutes as needed for chest pain.   temazepam 30 MG capsule Commonly known  as: RESTORIL Take 30 mg by mouth at bedtime.        Allergies:  Allergies  Allergen Reactions   Sulfa Antibiotics Anaphylaxis    Family History: Family History  Problem Relation Age of Onset   Hypertension Mother    Heart attack Mother    Heart attack Father    Heart disease Brother        h/o CABG   Diabetes Brother    Kidney disease Brother    Kidney cancer Neg Hx    Bladder Cancer Neg Hx    Breast cancer Neg Hx     Social History:   reports that she has never smoked. She has never used smokeless tobacco. She reports that she does not drink alcohol and does not use drugs.  Physical Exam: BP (!) 174/73   Pulse 69   Ht 5\' 7"  (1.702 m)   Wt 177 lb (80.3 kg)    BMI 27.72 kg/m   Constitutional:  Alert and oriented, no acute distress, nontoxic appearing HEENT: [place], AT Cardiovascular: No clubbing, cyanosis, or edema Respiratory: Normal respiratory effort, no increased work of breathing Skin: No rashes, bruises or suspicious lesions Neurologic: Grossly intact, no focal deficits, moving all 4 extremities Psychiatric: Normal mood and affect  Laboratory Data: Results for orders placed or performed in visit on 09/02/20  Microscopic Examination   Urine  Result Value Ref Range   WBC, UA >30 (A) 0 - 5 /hpf   RBC 0-2 0 - 2 /hpf   Epithelial Cells (non renal) 0-10 0 - 10 /hpf   Casts Present (A) None seen /lpf   Cast Type Granular casts (A) N/A   Bacteria, UA Many (A) None seen/Few  Urinalysis, Complete  Result Value Ref Range   Specific Gravity, UA 1.020 1.005 - 1.030   pH, UA 5.5 5.0 - 7.5   Color, UA Yellow Yellow   Appearance Ur Cloudy (A) Clear   Leukocytes,UA 3+ (A) Negative   Protein,UA Trace (A) Negative/Trace   Glucose, UA Negative Negative   Ketones, UA Negative Negative   RBC, UA Trace (A) Negative   Bilirubin, UA Negative Negative   Urobilinogen, Ur 0.2 0.2 - 1.0 mg/dL   Nitrite, UA Negative Negative   Microscopic Examination See below:    Assessment & Plan:   1. Dysuria UA grossly infected today, will start empiric Augmentin and send for culture for further evaluation.  We will not start prophylactic antibiotics given recent instrumentation, though may consider this in the future if she continues to have recurrent infections.  Reinforced regular use of topical vaginal estrogen cream for UTI prevention.  Patient expressed understanding. - CULTURE, URINE COMPREHENSIVE - Urinalysis, Complete - amoxicillin-clavulanate (AUGMENTIN) 875-125 MG tablet; Take 1 tablet by mouth 2 (two) times daily for 5 days.  Dispense: 10 tablet; Refill: 0   Return if symptoms worsen or fail to improve.  11/02/20, PA-C  [employer]  [facility] [address], [address] [place], [place] [phone]

## 2020-09-05 ENCOUNTER — Inpatient Hospital Stay: Payer: Medicare Other | Attending: Oncology

## 2020-09-05 ENCOUNTER — Inpatient Hospital Stay: Payer: Medicare Other

## 2020-09-05 ENCOUNTER — Inpatient Hospital Stay: Payer: Medicare Other | Admitting: Oncology

## 2020-09-05 ENCOUNTER — Encounter: Payer: Self-pay | Admitting: Oncology

## 2020-09-05 ENCOUNTER — Other Ambulatory Visit: Payer: Self-pay

## 2020-09-05 VITALS — BP 146/71 | HR 82 | Temp 97.4°F | Resp 17 | Ht 67.0 in | Wt 175.0 lb

## 2020-09-05 DIAGNOSIS — Z79899 Other long term (current) drug therapy: Secondary | ICD-10-CM | POA: Diagnosis not present

## 2020-09-05 DIAGNOSIS — D649 Anemia, unspecified: Secondary | ICD-10-CM | POA: Diagnosis not present

## 2020-09-05 DIAGNOSIS — D631 Anemia in chronic kidney disease: Secondary | ICD-10-CM

## 2020-09-05 DIAGNOSIS — N183 Chronic kidney disease, stage 3 unspecified: Secondary | ICD-10-CM | POA: Insufficient documentation

## 2020-09-05 DIAGNOSIS — N189 Chronic kidney disease, unspecified: Secondary | ICD-10-CM

## 2020-09-05 LAB — HEMOGLOBIN AND HEMATOCRIT, BLOOD
HCT: 34.4 % — ABNORMAL LOW (ref 36.0–46.0)
Hemoglobin: 10.8 g/dL — ABNORMAL LOW (ref 12.0–15.0)

## 2020-09-05 MED ORDER — EPOETIN ALFA-EPBX 40000 UNIT/ML IJ SOLN
40000.0000 [IU] | Freq: Once | INTRAMUSCULAR | Status: AC
Start: 2020-09-05 — End: 2020-09-05
  Administered 2020-09-05: 40000 [IU] via SUBCUTANEOUS
  Filled 2020-09-05: qty 1

## 2020-09-05 NOTE — Progress Notes (Signed)
Hematology/Oncology Consult note [facility]  [phone] [phone]  Patient Care Team: [name], MD as PCP - General (Family Medicine) Minna Merritts, MD as PCP - Cardiology (Cardiology) Diona Browner Paulino Door, MD as Referring Physician (Orthopedic Surgery) Minna Merritts, MD as Consulting Physician (Cardiology)   Name of the patient: [name] Cooper  [id number]  05-23-1940   Date of visit: 09/05/20  Diagnosis- normocytic anemia likely secondary to chronic kidney disease  Chief complaint/ Reason for visit-routine follow-up of anemia of kidney disease  Heme/Onc history: Patient is a 80 year old female who was seen by Dr. Sherrine Maples in the past for evaluation of anemia. Her hemoglobin has been ranging between 9.9-10.8 over the last couple of years. She is also noted to have chronic kidney disease with a creatinine ranging from 1.5-2. 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   Review of blood work from October 2017 was as follows:  methylmalonic acid was elevated at 778. TIBC was low and ferritin was elevated at 399. spep showed polyclonal gammopathy. Cbc showed white count of 9.2, H&H of 10.1/30.6 and a platelet count of 297. Reticulocyte count was low at 0.8 in appropriate for the degree of anemia. Normal immunofixation pattern was noted in urine   Patient was started on monthly B12 injections given elevated MMA   Results of blood work from 02/11/2020 were as follows: CBC showed white count of 12.6, H&H of 11/35.2 with an MCV of 86 and a platelet count of 279.  Reticulocyte count was mildly low for the degree of anemia at 0.8.  Haptoglobin elevated TSH was normal.  Myeloma panel showed polyclonal increase in immunoglobulins.  Serum free light chain ratio was mildly abnormal at 1.9.  TSH was normal at 2.1.  ESR was elevated at 80.  CMP showed creatinine of 1.6.   ANA comprehensive panel showed mildly elevated ENA IgG antibody of 1.7.   Patient is presently receiving Retacrit every 3 weeks  Interval history-currently patient reports doing well.  Appetite and weight have remained stable.  Has been mild baseline fatigue  ECOG PS- 1 Pain scale- 0   Review of systems- Review of Systems  Constitutional:  Positive for malaise/fatigue. Negative for chills, fever and weight loss.  HENT:  Negative for congestion, ear discharge and nosebleeds.   Eyes:  Negative for blurred vision.  Respiratory:  Negative for cough, hemoptysis, sputum production, shortness of breath and wheezing.   Cardiovascular:  Negative for chest pain, palpitations, orthopnea and claudication.  Gastrointestinal:  Negative for abdominal pain, blood in stool, constipation, diarrhea, heartburn, melena, nausea and vomiting.  Genitourinary:  Negative for dysuria, flank pain, frequency, hematuria and urgency.  Musculoskeletal:  Negative for back pain, joint pain and myalgias.  Skin:  Negative for rash.  Neurological:  Negative for dizziness, tingling, focal weakness, seizures, weakness and headaches.  Endo/Heme/Allergies:  Does not bruise/bleed easily.  Psychiatric/Behavioral:  Negative for depression and suicidal ideas. The patient does not have insomnia.      Allergies  Allergen Reactions   Sulfa Antibiotics Anaphylaxis     Past Medical History:  Diagnosis Date   Anemia    Arthritis    left hip, left ankle   Cholelithiases    CKD (chronic kidney disease), stage II    Coronary artery disease    a. cardiac cath 01/2011: mid LAD stent patent, mild lumenal irreg's, nl EF 55%, no  AS/MR   Fibromyalgia    Frozen shoulder    History of kidney stones    HTN (hypertension)    controlled with medication;    Hyperlipidemia    a. statin intolerant   Iron deficiency    Thrombocytopenia (HCC)    Ulcer of left lower extremity, limited to breakdown of skin (HCC)    UTI (urinary tract  infection)    Venous stasis      Past Surgical History:  Procedure Laterality Date   CARDIAC CATHETERIZATION     stents placed in mid LAD   CERVICAL SPINE SURGERY     FETAL BLOOD TRANSFUSION  2014   Frozen Shoulder     KIDNEY STONE SURGERY     LEG SURGERY     broke her leg    LOWER EXTREMITY ANGIOGRAPHY Right 02/17/2019   Procedure: LOWER EXTREMITY ANGIOGRAPHY;  Surgeon: Katha Cabal, MD;  Location: [place];  Service: Cardiovascular;  Laterality: Right;   LOWER EXTREMITY ANGIOGRAPHY Left 03/18/2019   Procedure: LOWER EXTREMITY ANGIOGRAPHY;  Surgeon: Katha Cabal, MD;  Location: [facility];  Service: Cardiovascular;  Laterality: Left;    Social History   Socioeconomic History   Marital status: Widowed    Spouse name: Not on file   Number of children: 3   Years of education: Not on file   Highest education level: Bachelor's degree (e.g., BA, AB, BS)  Occupational History   Not on file  Tobacco Use   Smoking status: Never   Smokeless tobacco: Never  Vaping Use   Vaping Use: Never used  Substance and Sexual Activity   Alcohol use: No   Drug use: No   Sexual activity: Not Currently  Other Topics Concern   Not on file  Social History Narrative   Not on file   Social Determinants of Health   Financial Resource Strain: Not on file  Food Insecurity: Not on file  Transportation Needs: Not on file  Physical Activity: Not on file  Stress: Not on file  Social Connections: Not on file  Intimate Partner Violence: Not on file    Family History  Problem Relation Age of Onset   Hypertension Mother    Heart attack Mother    Heart attack Father    Heart disease Brother        h/o CABG   Diabetes Brother    Kidney disease Brother    Kidney cancer Neg Hx    Bladder Cancer Neg Hx    Breast cancer Neg Hx      Current Outpatient Medications:    atorvastatin (LIPITOR) 20 MG tablet, TAKE 1 TABLET(20 MG) BY MOUTH DAILY, Disp: 90 tablet,  Rfl: 3   clopidogrel (PLAVIX) 75 MG tablet, Take 1 tablet (75 mg total) by mouth daily., Disp: 90 tablet, Rfl: 4   estradiol (ESTRACE) 0.1 MG/GM vaginal cream, Apply a pea size amount Monday, Wednesday, and Friday night, Disp: 42.5 g, Rfl: 12   losartan-hydrochlorothiazide (HYZAAR) 100-25 MG tablet, Take 1 tablet by mouth daily., Disp: 90 tablet, Rfl: 3   nitroGLYCERIN (NITROSTAT) 0.4 MG SL tablet, Place 1 tablet (0.4 mg total) under the tongue every 5 (five) minutes as needed for chest pain., Disp: 25 tablet, Rfl: 3   temazepam (RESTORIL) 30 MG capsule, Take 30 mg by mouth at bedtime. , Disp: , Rfl:    ezetimibe (ZETIA) 10 MG tablet, Take 1 tablet (10 mg total) by mouth daily., Disp: 90 tablet, Rfl: 3  No current facility-administered medications for this visit.  Facility-Administered Medications Ordered in Other Visits:    epoetin alfa-epbx (RETACRIT) injection 40,000 Units, 40,000 Units, Subcutaneous, Q90 days, Sindy Guadeloupe, MD, 40,000 Units at 07/25/20 1113  Physical exam:  Vitals:   09/05/20 1005  BP: (!) 146/71  Pulse: 82  Resp: 17  Temp: (!) 97.4 F (36.3 C)  SpO2: 100%  Weight: 175 lb (79.4 kg)  Height: 5' 7"  (1.702 m)   Physical Exam Constitutional:      General: She is not in acute distress. Cardiovascular:     Rate and Rhythm: Normal rate and regular rhythm.     Heart sounds: Normal heart sounds.  Pulmonary:     Effort: Pulmonary effort is normal.     Breath sounds: Normal breath sounds.  Skin:    General: Skin is warm and dry.  Neurological:     Mental Status: She is alert and oriented to person, place, and time.     CMP Latest Ref Rng & Units 07/12/2020  Glucose 70 - 99 mg/dL 102(H)  BUN 8 - 23 mg/dL 17  Creatinine 0.44 - 1.00 mg/dL 1.19(H)  Sodium 135 - 145 mmol/L 140  Potassium 3.5 - 5.1 mmol/L 3.5  Chloride 98 - 111 mmol/L 106  CO2 22 - 32 mmol/L 25  Calcium 8.9 - 10.3 mg/dL 8.9  Total Protein 6.5 - 8.1 g/dL -  Total Bilirubin 0.3 - 1.2 mg/dL -   Alkaline Phos 38 - 126 U/L -  AST 15 - 41 U/L -  ALT 0 - 44 U/L -   CBC Latest Ref Rng & Units 09/05/2020  WBC 4.0 - 10.5 K/uL -  Hemoglobin 12.0 - 15.0 g/dL 10.8(L)  Hematocrit 36.0 - 46.0 % 34.4(L)  Platelets 150 - 400 K/uL -     Assessment and plan- Patient is a 80 y.o. female with anemia of chronic kidney disease here for routine follow-up  After starting Retacrit patient's hemoglobin is improved and now remaining between 10-11.  Plan is to continue Retacrit every 3 weeks if hemoglobin is less than 11.  Hold Retacrit if hemoglobin greater than 11.  She will see covering NP in 3 months with CBC ferritin and iron studies and I will see her back in 6 months   Visit Diagnosis 1. Anemia of chronic kidney failure, stage 3 (moderate) ([place])   2. Erythropoietin (EPO) stimulating agent anemia management patient      Dr. Randa Evens, MD, MPH [facility] at [facility] [phone] 09/05/2020 4:45 PM

## 2020-09-08 LAB — CULTURE, URINE COMPREHENSIVE

## 2020-09-09 ENCOUNTER — Encounter: Payer: Self-pay | Admitting: Emergency Medicine

## 2020-09-09 ENCOUNTER — Emergency Department: Payer: Medicare Other

## 2020-09-09 ENCOUNTER — Other Ambulatory Visit: Payer: Self-pay

## 2020-09-09 ENCOUNTER — Emergency Department
Admission: EM | Admit: 2020-09-09 | Discharge: 2020-09-09 | Disposition: A | Discharge status: Home / Self Care | Payer: Medicare Other | Attending: Emergency Medicine | Admitting: Emergency Medicine

## 2020-09-09 DIAGNOSIS — Z79899 Other long term (current) drug therapy: Secondary | ICD-10-CM | POA: Insufficient documentation

## 2020-09-09 DIAGNOSIS — I25118 Atherosclerotic heart disease of native coronary artery with other forms of angina pectoris: Secondary | ICD-10-CM | POA: Diagnosis not present

## 2020-09-09 DIAGNOSIS — N182 Chronic kidney disease, stage 2 (mild): Secondary | ICD-10-CM | POA: Insufficient documentation

## 2020-09-09 DIAGNOSIS — M79604 Pain in right leg: Secondary | ICD-10-CM | POA: Insufficient documentation

## 2020-09-09 DIAGNOSIS — Z7902 Long term (current) use of antithrombotics/antiplatelets: Secondary | ICD-10-CM | POA: Diagnosis not present

## 2020-09-09 DIAGNOSIS — I129 Hypertensive chronic kidney disease with stage 1 through stage 4 chronic kidney disease, or unspecified chronic kidney disease: Secondary | ICD-10-CM | POA: Insufficient documentation

## 2020-09-09 MED ORDER — MELOXICAM 7.5 MG PO TABS: 7.5000 mg | ORAL_TABLET | Freq: Every day | ORAL | 0 refills | Status: AC

## 2020-09-09 NOTE — ED Triage Notes (Signed)
Pt to ED from [facility], pt is having pain and swelling in her left leg and they wanted her evaluated for blood clot. Pt denies hx/o blood clots. Pt cut her leg last month but that has healed.  Pt is in NAD.
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
## 2020-09-22 ENCOUNTER — Other Ambulatory Visit: Payer: Self-pay

## 2020-09-22 ENCOUNTER — Encounter: Payer: Self-pay | Admitting: Physician Assistant

## 2020-09-22 ENCOUNTER — Ambulatory Visit: Payer: Medicare Other | Admitting: Physician Assistant

## 2020-09-22 VITALS — BP 150/72 | HR 64 | Ht 67.0 in | Wt 178.0 lb

## 2020-09-22 DIAGNOSIS — N390 Urinary tract infection, site not specified: Secondary | ICD-10-CM | POA: Diagnosis not present

## 2020-09-22 LAB — URINALYSIS, COMPLETE
Bilirubin, UA: NEGATIVE
Glucose, UA: NEGATIVE
Ketones, UA: NEGATIVE
Nitrite, UA: NEGATIVE
Specific Gravity, UA: 1.025 (ref 1.005–1.030)
Urobilinogen, Ur: 0.2 mg/dL (ref 0.2–1.0)
pH, UA: 5 (ref 5.0–7.5)

## 2020-09-22 LAB — MICROSCOPIC EXAMINATION: WBC, UA: >30 /hpf — AB (ref 0–5)

## 2020-09-22 MED ORDER — CEFUROXIME AXETIL 250 MG PO TABS: 250.0000 mg | ORAL_TABLET | Freq: Two times a day (BID) | ORAL | 0 refills | Status: AC

## 2020-09-22 NOTE — Patient Instructions (Addendum)
Go pick up your prescription for the antibiotic Ceftin (also known as cefuroxime) on your way home today. You will take this two times daily for 5 days. If you get to the pharmacy and they offer you another antibiotic named Cipro or ciprofloxacin, tell them you do not wish to fill that prescription and you want ONLY the cefuroxime. I want to recheck your urine in clinic next week after you complete your antibiotics to make sure the blood in your urine has gone away. I will call you early next week to discuss starting you on a daily low-dose antibiotic to prevent UTIs based on your urine culture results.

## 2020-09-22 NOTE — Progress Notes (Signed)
09/22/2020 9:10 AM   [name] Cooper 1940-05-10 [id number]  CC: Chief Complaint  Patient presents with   Dysuria   HPI: [name] Cooper is a 80 y.o. female with PMH recurrent UTI on topical vaginal estrogen cream, atrophic vaginitis, and nephrolithiasis who presents today for evaluation of possible UTI.    I saw her in clinic most recently 20 days ago for the same, at which point she was 9 days s/p cystoscopy for further evaluation of her recurrent UTIs.  No significant findings on cystoscopy.  Urine culture at that time grew multidrug-resistant E. coli and I treated her with culture appropriate Augmentin.  Patient was seen by her PCP yesterday for recurrent dysuria.  Urinalysis revealed pyuria, microscopic hematuria, and bacteriuria and she was prescribed Cipro, however she states she was never seen by a provider and did not receive this prescription.  Today she reports return of dysuria, urgency, and frequency 3 days ago.  She denies fever, chills, nausea, vomiting, and flank pain.  She underwent renal ultrasound on 10/14/2019 with no evidence of urolithiasis.  In-office UA today positive for 2+ blood, 2+ protein, and 2+ leukocyte esterase; urine microscopy with >30 WBCs/HPF, 11-30 RBCs/HPF, and many bacteria.  PMH: Past Medical History:  Diagnosis Date   Anemia    Arthritis    left hip, left ankle   Cholelithiases    CKD (chronic kidney disease), stage II    Coronary artery disease    a. cardiac cath 01/2011: mid LAD stent patent, mild lumenal irreg's, nl EF 55%, no AS/MR   Fibromyalgia    Frozen shoulder    History of kidney stones    HTN (hypertension)    controlled with medication;    Hyperlipidemia    a. statin intolerant   Iron deficiency    Thrombocytopenia (HCC)    Ulcer of left lower extremity, limited to breakdown of skin (HCC)    UTI (urinary tract infection)    Venous stasis     Surgical History: Past Surgical History:  Procedure Laterality Date    CARDIAC CATHETERIZATION     stents placed in mid LAD   CERVICAL SPINE SURGERY     FETAL BLOOD TRANSFUSION  2014   Frozen Shoulder     KIDNEY STONE SURGERY     LEG SURGERY     broke her leg    LOWER EXTREMITY ANGIOGRAPHY Right 02/17/2019   Procedure: LOWER EXTREMITY ANGIOGRAPHY;  Surgeon: Renford Dills, MD;  Location: [facility];  Service: Cardiovascular;  Laterality: Right;   LOWER EXTREMITY ANGIOGRAPHY Left 03/18/2019   Procedure: LOWER EXTREMITY ANGIOGRAPHY;  Surgeon: Renford Dills, MD;  Location: [facility];  Service: Cardiovascular;  Laterality: Left;    Home Medications:  Allergies as of 09/22/2020       Reactions   Sulfa Antibiotics Anaphylaxis        Medication List        Accurate as of September 22, 2020  9:10 AM. If you have any questions, ask your nurse or doctor.          atorvastatin 20 MG tablet Commonly known as: LIPITOR TAKE 1 TABLET(20 MG) BY MOUTH DAILY   clopidogrel 75 MG tablet Commonly known as: Plavix Take 1 tablet (75 mg total) by mouth daily.   estradiol 0.1 MG/GM vaginal cream Commonly known as: ESTRACE Apply a pea size amount Monday, Wednesday, and Friday night   ezetimibe 10 MG tablet Commonly known as: ZETIA Take 1 tablet (10  mg total) by mouth daily.   losartan-hydrochlorothiazide 100-25 MG tablet Commonly known as: HYZAAR Take 1 tablet by mouth daily.   meloxicam 7.5 MG tablet Commonly known as: Mobic Take 1 tablet (7.5 mg total) by mouth daily.   nitroGLYCERIN 0.4 MG SL tablet Commonly known as: NITROSTAT Place 1 tablet (0.4 mg total) under the tongue every 5 (five) minutes as needed for chest pain.   temazepam 30 MG capsule Commonly known as: RESTORIL Take 30 mg by mouth at bedtime.        Allergies:  Allergies  Allergen Reactions   Sulfa Antibiotics Anaphylaxis    Family History: Family History  Problem Relation Age of Onset   Hypertension Mother    Heart attack Mother    Heart  attack Father    Heart disease Brother        h/o CABG   Diabetes Brother    Kidney disease Brother    Kidney cancer Neg Hx    Bladder Cancer Neg Hx    Breast cancer Neg Hx     Social History:   reports that she has never smoked. She has never used smokeless tobacco. She reports that she does not drink alcohol and does not use drugs.  Physical Exam: BP (!) 150/72   Pulse 64   Ht 5\' 7"  (1.702 m)   Wt 178 lb (80.7 kg)   BMI 27.88 kg/m   Constitutional:  Alert and oriented, no acute distress, nontoxic appearing HEENT: [place], AT Cardiovascular: No clubbing, cyanosis, or edema Respiratory: Normal respiratory effort, no increased work of breathing GU: No CVA tenderness Skin: No rashes, bruises or suspicious lesions Neurologic: Grossly intact, no focal deficits, moving all 4 extremities Psychiatric: Normal mood and affect  Laboratory Data: Results for orders placed or performed in visit on 09/22/20  CULTURE, URINE COMPREHENSIVE   Specimen: Urine   UR  Result Value Ref Range   Urine Culture, Comprehensive Final report (A)    Organism ID, Bacteria Escherichia coli (A)    Organism ID, Bacteria Klebsiella pneumoniae (A)    ANTIMICROBIAL SUSCEPTIBILITY Comment   Microscopic Examination   Urine  Result Value Ref Range   WBC, UA >30 (A) 0 - 5 /hpf   RBC 11-30 (A) 0 - 2 /hpf   Epithelial Cells (non renal) 0-10 0 - 10 /hpf   Bacteria, UA Many (A) None seen/Few  Urinalysis, Complete  Result Value Ref Range   Specific Gravity, UA 1.025 1.005 - 1.030   pH, UA 5.0 5.0 - 7.5   Color, UA Yellow Yellow   Appearance Ur Cloudy (A) Clear   Leukocytes,UA 2+ (A) Negative   Protein,UA 2+ (A) Negative/Trace   Glucose, UA Negative Negative   Ketones, UA Negative Negative   RBC, UA 2+ (A) Negative   Bilirubin, UA Negative Negative   Urobilinogen, Ur 0.2 0.2 - 1.0 mg/dL   Nitrite, UA Negative Negative   Microscopic Examination See below:    Assessment & Plan:   1. Recurrent UTI UA grossly  infected today.  Will prescribe empiric cefuroxime per her most recent culture data.  Counseled her to not fill or take previously prescribed Cipro, as her most recent culture was Cipro resistant.  We will plan for repeat UA in 1 week to prove resolution of MH.  If persistent, will obtain CT urogram for further evaluation as there is no cross-sectional imaging available.  At this point, I recommend starting her on prophylactic antibiotics for UTI prevention.  We will  call her early next week to discuss this per culture results. - Urinalysis, Complete - CULTURE, URINE COMPREHENSIVE - cefUROXime (CEFTIN) 250 MG tablet; Take 1 tablet (250 mg total) by mouth 2 (two) times daily with a meal for 5 days.  Dispense: 10 tablet; Refill: 0   Return in about 1 week (around 09/29/2020) for Lab visit for UA.  Carman Ching, PA-C  [facility] [address], [address] [place], [place] [address] [phone]

## 2020-09-25 LAB — CULTURE, URINE COMPREHENSIVE

## 2020-09-27 ENCOUNTER — Inpatient Hospital Stay: Payer: Medicare Other

## 2020-09-27 ENCOUNTER — Other Ambulatory Visit: Payer: Self-pay

## 2020-09-27 ENCOUNTER — Inpatient Hospital Stay: Payer: Medicare Other | Attending: Oncology

## 2020-09-27 ENCOUNTER — Ambulatory Visit (INDEPENDENT_AMBULATORY_CARE_PROVIDER_SITE_OTHER): Payer: Medicare Other | Admitting: Urology

## 2020-09-27 ENCOUNTER — Encounter: Payer: Self-pay | Admitting: Urology

## 2020-09-27 VITALS — BP 130/74 | HR 72 | Ht 68.0 in | Wt 178.0 lb

## 2020-09-27 VITALS — BP 170/74 | HR 53

## 2020-09-27 DIAGNOSIS — N189 Chronic kidney disease, unspecified: Secondary | ICD-10-CM

## 2020-09-27 DIAGNOSIS — R413 Other amnesia: Secondary | ICD-10-CM

## 2020-09-27 DIAGNOSIS — N183 Chronic kidney disease, stage 3 unspecified: Secondary | ICD-10-CM | POA: Diagnosis not present

## 2020-09-27 DIAGNOSIS — D631 Anemia in chronic kidney disease: Secondary | ICD-10-CM

## 2020-09-27 DIAGNOSIS — R310 Gross hematuria: Secondary | ICD-10-CM

## 2020-09-27 DIAGNOSIS — N390 Urinary tract infection, site not specified: Secondary | ICD-10-CM

## 2020-09-27 LAB — CBC WITH DIFFERENTIAL/PLATELET
Abs Immature Granulocytes: 0.02 10*3/uL (ref 0.00–0.07)
Basophils Absolute: 0 10*3/uL (ref 0.0–0.1)
Basophils Relative: 1 %
Eosinophils Absolute: 0.3 10*3/uL (ref 0.0–0.5)
Eosinophils Relative: 5 %
HCT: 35.6 % — ABNORMAL LOW (ref 36.0–46.0)
Hemoglobin: 10.8 g/dL — ABNORMAL LOW (ref 12.0–15.0)
Immature Granulocytes: 0 %
Lymphocytes Relative: 30 %
Lymphs Abs: 2.1 10*3/uL (ref 0.7–4.0)
MCH: 25 pg — ABNORMAL LOW (ref 26.0–34.0)
MCHC: 30.3 g/dL (ref 30.0–36.0)
MCV: 82.4 fL (ref 80.0–100.0)
Monocytes Absolute: 0.5 10*3/uL (ref 0.1–1.0)
Monocytes Relative: 8 %
Neutro Abs: 3.9 10*3/uL (ref 1.7–7.7)
Neutrophils Relative %: 56 %
Platelets: 254 10*3/uL (ref 150–400)
RBC: 4.32 MIL/uL (ref 3.87–5.11)
RDW: 17.4 % — ABNORMAL HIGH (ref 11.5–15.5)
WBC: 6.9 10*3/uL (ref 4.0–10.5)
nRBC: 0 % (ref 0.0–0.2)

## 2020-09-27 MED ORDER — AMOXICILLIN-POT CLAVULANATE 875-125 MG PO TABS: 1.0000 | ORAL_TABLET | Freq: Two times a day (BID) | ORAL | 0 refills | Status: DC

## 2020-09-27 MED ORDER — EPOETIN ALFA-EPBX 40000 UNIT/ML IJ SOLN: 40000.0000 [IU] | Freq: Once | INTRAMUSCULAR | Status: AC

## 2020-09-27 NOTE — Progress Notes (Signed)
09/27/2020 9:35 AM   [name] Cooper Dec 11, 1940 [id number]  Referring provider: [name], MD [address] [place],  [place] [address]  Urological history: 1. rUTI's -contributing factors of age and vaginal atrophy -RUS 10/14/2019 bilateral renal cysts, but no hydronephrosis or stones -cysto 08/2020 NED -managed with vaginal estrogen cream, cranberry tablets -documented positive urine cultures over the last year   E. Coli UTI resistant to ampicillin, ciprofloxacin, gentamicin and levofloxacin on 11/05/2019  E. Coli UTI resistant to ampicillin, ciprofloxacin, gentamicin, levofloxacin, tetracycline and trimethoprim/sulfa on 09/23/2019  E. Coli UTI resistant to ampicillin, ciprofloxacin, gentamicin and levofloxacin on 11/05/2019  E. Coli UTI resistant to ampicillin, ciprofloxacin, levofloxacin, tetracycline and trimethoprim/sulfa on 11/18/2019  E. Coli UTI resistant to ampicillin, ciprofloxacin, levofloxacin, tetracycline and trimethoprim/sulfa on 12/04/2019  E. coli UTI resistant to ampicillin, ciprofloxacin, gentamicin, levofloxacin, tetracycline and trimethoprim/sulfa on December 23, 2019  Klebsiella pneumoniae resistant to ampicillin on June 17, 2020  Streptococcus anginosus group on Aug 01, 2020  E.coli resistant to ampicillin, ciprofloxacin, levofloxacin, tetracycline and trimethoprim/sulfa on 09/02/2020   E.coli resistant to ampicillin, ciprofloxacin, levofloxacin, tetracycline and trimethoprim/sulfa and Klebsiella pneumoniae resistant ampicillin and nitrofurantoin 09/22/2020    2. Vaginal atrophy -managed with vaginal estrogen cream three nights weekly  3. Nephrolithiasis -KUB 08/2017 right renal stones  4. Bilateral renal cysts -RUS 09/2019 -complex cysts bilaterally but unchanged since 2015  Chief Complaint  Patient presents with   Recurrent UTI     HPI: [name] Cooper is a 80 y.o. female who presents today for an urgent appointment  for UTI symptoms after completing an antibiotic.   She was recently seen on 09/22/2020 for dysuria.  Her urine culture was positive for E.coli and Klebsiella pneumoniae and she was on 10 days of Ceftin.   She has been experiencing urgency, frequency (every 2 hours), dysuria and gross hematuria.  Patient denies any modifying or aggravating factors.  Patient denies any suprapubic/flank pain.  Patient denies any fevers, chills, nausea or vomiting.    She is applying the vaginal estrogen cream when she can remember.  She is also taking cranberry tablets when she can remember.     She is not taking probiotics or D-mannose.  UA > 30 WBC's, > 30 RBC's and few bacteria.  PVR is 0 mL.    PMH: Past Medical History:  Diagnosis Date   Anemia    Arthritis    left hip, left ankle   Cholelithiases    CKD (chronic kidney disease), stage II    Coronary artery disease    a. cardiac cath 01/2011: mid LAD stent patent, mild lumenal irreg's, nl EF 55%, no AS/MR   Fibromyalgia    Frozen shoulder    History of kidney stones    HTN (hypertension)    controlled with medication;    Hyperlipidemia    a. statin intolerant   Iron deficiency    Thrombocytopenia (HCC)    Ulcer of left lower extremity, limited to breakdown of skin (HCC)    UTI (urinary tract infection)    Venous stasis     Surgical History: Past Surgical History:  Procedure Laterality Date   CARDIAC CATHETERIZATION     stents placed in mid LAD   CERVICAL SPINE SURGERY     FETAL BLOOD TRANSFUSION  2014   Frozen Shoulder     KIDNEY STONE SURGERY     LEG SURGERY     broke her leg    LOWER EXTREMITY ANGIOGRAPHY Right  02/17/2019   Procedure: LOWER EXTREMITY ANGIOGRAPHY;  Surgeon: Renford Dills, MD;  Location: [facility];  Service: Cardiovascular;  Laterality: Right;   LOWER EXTREMITY ANGIOGRAPHY Left 03/18/2019   Procedure: LOWER EXTREMITY ANGIOGRAPHY;  Surgeon: Renford Dills, MD;  Location: [facility];   Service: Cardiovascular;  Laterality: Left;    Home Medications:  Allergies as of 09/27/2020       Reactions   Sulfa Antibiotics Anaphylaxis        Medication List        Accurate as of September 27, 2020  9:35 AM. If you have any questions, ask your nurse or doctor.          amoxicillin-clavulanate 875-125 MG tablet Commonly known as: AUGMENTIN Take 1 tablet by mouth every 12 (twelve) hours. Started by: Michiel Cowboy, PA-C   atorvastatin 20 MG tablet Commonly known as: LIPITOR TAKE 1 TABLET(20 MG) BY MOUTH DAILY   cefUROXime 250 MG tablet Commonly known as: CEFTIN Take 1 tablet (250 mg total) by mouth 2 (two) times daily with a meal for 5 days.   clopidogrel 75 MG tablet Commonly known as: Plavix Take 1 tablet (75 mg total) by mouth daily.   estradiol 0.1 MG/GM vaginal cream Commonly known as: ESTRACE Apply a pea size amount Monday, Wednesday, and Friday night   ezetimibe 10 MG tablet Commonly known as: ZETIA Take 1 tablet (10 mg total) by mouth daily.   losartan-hydrochlorothiazide 100-25 MG tablet Commonly known as: HYZAAR Take 1 tablet by mouth daily.   meloxicam 7.5 MG tablet Commonly known as: Mobic Take 1 tablet (7.5 mg total) by mouth daily.   nitroGLYCERIN 0.4 MG SL tablet Commonly known as: NITROSTAT Place 1 tablet (0.4 mg total) under the tongue every 5 (five) minutes as needed for chest pain.   temazepam 30 MG capsule Commonly known as: RESTORIL Take 30 mg by mouth at bedtime.        Allergies:  Allergies  Allergen Reactions   Sulfa Antibiotics Anaphylaxis    Family History: Family History  Problem Relation Age of Onset   Hypertension Mother    Heart attack Mother    Heart attack Father    Heart disease Brother        h/o CABG   Diabetes Brother    Kidney disease Brother    Kidney cancer Neg Hx    Bladder Cancer Neg Hx    Breast cancer Neg Hx     Social History:  reports that she has never smoked. She has never used  smokeless tobacco. She reports that she does not drink alcohol and does not use drugs.  ROS: For pertinent review of systems please refer to history of present illness  Physical Exam: BP 130/74   Pulse 72   Ht 5\' 8"  (1.727 m)   Wt 178 lb (80.7 kg)   BMI 27.06 kg/m   Constitutional:  Well nourished. Alert and oriented, No acute distress. HEENT: [place] AT, mask in place.  Trachea midline Cardiovascular: No clubbing, cyanosis, or edema. Respiratory: Normal respiratory effort, no increased work of breathing. Neurologic: Grossly intact, no focal deficits, moving all 4 extremities. Psychiatric: Normal mood and affect.    Laboratory Data: Urinalysis Component     Latest Ref Rng & Units 09/27/2020  Specific Gravity, UA     1.005 - 1.030 1.020  pH, UA     5.0 - 7.5 5.5  Color, UA     Yellow Yellow  Appearance  Ur     Clear Cloudy (A)  Leukocytes,UA     Negative 1+ (A)  Protein,UA     Negative/Trace 1+ (A)  Glucose, UA     Negative Negative  Ketones, UA     Negative Negative  RBC, UA     Negative 2+ (A)  Bilirubin, UA     Negative Negative  Urobilinogen, Ur     0.2 - 1.0 mg/dL 0.2  Nitrite, UA     Negative Negative  Microscopic Examination      See below:   Component     Latest Ref Rng & Units 09/27/2020         9:02 AM  WBC, UA     0 - 5 /hpf >30 (H)  RBC     3.87 - 5.11 MIL/uL >30 (H)  Epithelial Cells (non renal)     0 - 10 /hpf 0-10  Bacteria, UA     None seen/Few Few (A)   I have reviewed the labs.   Pertinent Imaging:  Results for [name] Cooper, [name] Cooper (MRN [id number]) as of 09/27/2020 09:31  Ref. Range 09/27/2020 09:02  Scan Result Unknown 0     Assessment & Plan:    1. Recurrent UTI's  -UA with pyuria -urine sent for culture -start Augmentin 875/125, BID x 7 days -I have a suspicion that her memory loss is contributing to her recurrent UTIs as she cannot remember to engage in prophylactic strategies on a consistent basis and I am also concerned that she may  not be taking the antibiotics as prescribed -There also may be a situation where she is actually colonized versus having recurrent UTIs in the dysuria is more due to vaginal atrophy versus an actual infection                                   2. Vaginal atrophy -continue the vaginal estrogen cream Monday, Wednesday and Friday nights  3.  Nephrolithiasis -Right lower pole stones -No intervention warranted at this time                                      4. Memory loss -Has upcoming appointments with neurologist  5.  Gross hematuria -We will schedule CT urogram at this time as she is not had any cross-sectional imaging   Return for CT urogram report.  These notes generated with voice recognition software. I apologize for typographical errors.  Michiel Cowboy, PA-C  [facility] [address] [place], [place] [address] [phone]

## 2020-09-28 LAB — MICROSCOPIC EXAMINATION
RBC, Urine: >30 /hpf — ABNORMAL HIGH (ref 0–2)
WBC, UA: >30 /hpf — ABNORMAL HIGH (ref 0–5)

## 2020-09-28 LAB — URINALYSIS, COMPLETE
Bilirubin, UA: NEGATIVE
Glucose, UA: NEGATIVE
Ketones, UA: NEGATIVE
Nitrite, UA: NEGATIVE
Specific Gravity, UA: 1.02 (ref 1.005–1.030)
Urobilinogen, Ur: 0.2 mg/dL (ref 0.2–1.0)
pH, UA: 5.5 (ref 5.0–7.5)

## 2020-09-29 ENCOUNTER — Other Ambulatory Visit: Payer: Self-pay

## 2020-09-30 ENCOUNTER — Encounter: Payer: Self-pay | Admitting: Urology

## 2020-10-03 ENCOUNTER — Telehealth: Payer: Self-pay | Admitting: Urology

## 2020-10-03 ENCOUNTER — Ambulatory Visit: Admission: RE | Admit: 2020-10-03 | Payer: Medicare Other | Source: Ambulatory Visit

## 2020-10-03 NOTE — Telephone Encounter (Signed)
[name] Cooper did not show for her CT hematuria today.  I spoke with her daughter, [name] Cooper, and notified her of her no showing today.  Please call [name] Cooper and get her rescheduled.

## 2020-10-03 NOTE — Telephone Encounter (Signed)
Spoke with patient and gave her the number to [facility] scheduling, pt states she has a bad cold and forgot.   ** the appt for CT today for CT of the chest not hematuria, CT hematuria have not been scheduled yet

## 2020-10-04 NOTE — Telephone Encounter (Signed)
Michiel Cowboy A, PA-C  You 12 minutes ago (4:19 PM)     We need to get the CT scan completed prior to prescribing any further antibiotics

## 2020-10-04 NOTE — Telephone Encounter (Signed)
Spoke with patient and advised

## 2020-10-04 NOTE — Telephone Encounter (Signed)
Patient called the office today requesting another prescription for amoxicillin.  She says that she completed the course, but is still experiencing burning during urination.   I advised her that she still needs to get the CT of her abdomen scheduled.  She agreed and we called centralized radiology scheduling together.    Please advise on the request for antibiotic and route to the clinical staff to contact patient.

## 2020-10-13 ENCOUNTER — Other Ambulatory Visit: Payer: Self-pay

## 2020-10-13 ENCOUNTER — Ambulatory Visit
Admission: RE | Admit: 2020-10-13 | Discharge: 2020-10-13 | Disposition: A | Discharge status: Home / Self Care | Payer: Medicare Other | Source: Ambulatory Visit | Attending: Urology | Admitting: Urology

## 2020-10-13 DIAGNOSIS — N390 Urinary tract infection, site not specified: Secondary | ICD-10-CM | POA: Insufficient documentation

## 2020-10-13 DIAGNOSIS — R310 Gross hematuria: Secondary | ICD-10-CM | POA: Diagnosis present

## 2020-10-13 LAB — POCT I-STAT CREATININE: Creatinine, Ser: 1.5 mg/dL — ABNORMAL HIGH (ref 0.44–1.00)

## 2020-10-13 MED ORDER — IOHEXOL 350 MG/ML SOLN
75.0000 mL | Freq: Once | INTRAVENOUS | Status: AC | PRN
  Administered 2020-10-13: 75 mL via INTRAVENOUS

## 2020-10-18 ENCOUNTER — Inpatient Hospital Stay: Payer: Medicare Other

## 2020-10-18 ENCOUNTER — Other Ambulatory Visit: Payer: Self-pay | Admitting: *Deleted

## 2020-10-18 VITALS — BP 165/77 | HR 62

## 2020-10-18 DIAGNOSIS — D631 Anemia in chronic kidney disease: Secondary | ICD-10-CM

## 2020-10-18 DIAGNOSIS — N189 Chronic kidney disease, unspecified: Secondary | ICD-10-CM

## 2020-10-18 DIAGNOSIS — N183 Chronic kidney disease, stage 3 unspecified: Secondary | ICD-10-CM | POA: Diagnosis not present

## 2020-10-18 LAB — FERRITIN: Ferritin: 308 ng/mL — ABNORMAL HIGH (ref 11–307)

## 2020-10-18 LAB — IRON AND TIBC
Iron: 95 ug/dL (ref 28–170)
Saturation Ratios: 38 % — ABNORMAL HIGH (ref 10.4–31.8)
TIBC: 252 ug/dL (ref 250–450)
UIBC: 157 ug/dL

## 2020-10-18 LAB — CBC
HCT: 32.5 % — ABNORMAL LOW (ref 36.0–46.0)
Hemoglobin: 10.1 g/dL — ABNORMAL LOW (ref 12.0–15.0)
MCH: 26 pg (ref 26.0–34.0)
MCHC: 31.1 g/dL (ref 30.0–36.0)
MCV: 83.5 fL (ref 80.0–100.0)
Platelets: 213 10*3/uL (ref 150–400)
RBC: 3.89 MIL/uL (ref 3.87–5.11)
RDW: 16.5 % — ABNORMAL HIGH (ref 11.5–15.5)
WBC: 7.2 10*3/uL (ref 4.0–10.5)
nRBC: 0 % (ref 0.0–0.2)

## 2020-10-18 MED ORDER — EPOETIN ALFA-EPBX 40000 UNIT/ML IJ SOLN
40000.0000 [IU] | Freq: Once | INTRAMUSCULAR | Status: AC
  Administered 2020-10-18: 40000 [IU] via SUBCUTANEOUS
  Filled 2020-10-18: qty 1

## 2020-10-21 ENCOUNTER — Telehealth: Payer: Self-pay | Admitting: Urology

## 2020-10-21 ENCOUNTER — Other Ambulatory Visit: Payer: Self-pay

## 2020-10-21 ENCOUNTER — Other Ambulatory Visit: Payer: Self-pay | Admitting: Urology

## 2020-10-21 ENCOUNTER — Other Ambulatory Visit: Payer: Medicare Other

## 2020-10-21 DIAGNOSIS — N390 Urinary tract infection, site not specified: Secondary | ICD-10-CM

## 2020-10-21 LAB — URINALYSIS, COMPLETE
Bilirubin, UA: NEGATIVE
Glucose, UA: NEGATIVE
Nitrite, UA: NEGATIVE
Specific Gravity, UA: 1.02 (ref 1.005–1.030)
Urobilinogen, Ur: 0.2 mg/dL (ref 0.2–1.0)
pH, UA: 5.5 (ref 5.0–7.5)

## 2020-10-21 LAB — MICROSCOPIC EXAMINATION: WBC, UA: >30 /hpf — AB (ref 0–5)

## 2020-10-21 MED ORDER — AMOXICILLIN-POT CLAVULANATE 875-125 MG PO TABS: 1.0000 | ORAL_TABLET | Freq: Two times a day (BID) | ORAL | 0 refills | Status: DC

## 2020-10-21 NOTE — Telephone Encounter (Signed)
Do you want her to come in for an office visit or would a urine drop off be appropriate for her situation? Please advise.

## 2020-10-21 NOTE — Telephone Encounter (Signed)
Incoming call from pt who states that she has not heard back about her medication, she is requesting a call back.

## 2020-10-21 NOTE — Telephone Encounter (Signed)
Spoke with patient and she will come today to drop off urine.

## 2020-10-21 NOTE — Telephone Encounter (Signed)
Ok to fill 

## 2020-10-21 NOTE — Telephone Encounter (Signed)
Incoming call on triage line from pt who states that she is hurting with urination and would like a refill on her antibiotics as she has completed the CT scan.

## 2020-10-21 NOTE — Telephone Encounter (Signed)
Would you see if [name] Cooper is applying her vaginal estrogen cream 3 nights weekly?

## 2020-10-21 NOTE — Telephone Encounter (Signed)
Spoke with patient and she states some days she do forget but she does at least 2-3 times a week.

## 2020-10-21 NOTE — Telephone Encounter (Signed)
Per Shannon\:  Will you go ahead and send in a refill on her Augmentin?  Sam says her UA looks bad?

## 2020-10-21 NOTE — Telephone Encounter (Signed)
Called and left detailed message on VM that rx has been called into the pharmacy, advised pt to call if any questions.

## 2020-10-25 LAB — CULTURE, URINE COMPREHENSIVE

## 2020-11-04 ENCOUNTER — Other Ambulatory Visit: Payer: Self-pay

## 2020-11-04 DIAGNOSIS — D631 Anemia in chronic kidney disease: Secondary | ICD-10-CM

## 2020-11-08 ENCOUNTER — Inpatient Hospital Stay: Payer: Medicare Other

## 2020-11-08 ENCOUNTER — Inpatient Hospital Stay: Payer: Medicare Other | Attending: Oncology

## 2020-11-08 DIAGNOSIS — N183 Chronic kidney disease, stage 3 unspecified: Secondary | ICD-10-CM | POA: Insufficient documentation

## 2020-11-08 DIAGNOSIS — N189 Chronic kidney disease, unspecified: Secondary | ICD-10-CM

## 2020-11-08 DIAGNOSIS — D631 Anemia in chronic kidney disease: Secondary | ICD-10-CM | POA: Insufficient documentation

## 2020-11-08 LAB — CBC WITH DIFFERENTIAL/PLATELET
Abs Immature Granulocytes: 0.03 10*3/uL (ref 0.00–0.07)
Basophils Absolute: 0 10*3/uL (ref 0.0–0.1)
Basophils Relative: 0 %
Eosinophils Absolute: 0.1 10*3/uL (ref 0.0–0.5)
Eosinophils Relative: 1 %
HCT: 35.7 % — ABNORMAL LOW (ref 36.0–46.0)
Hemoglobin: 11 g/dL — ABNORMAL LOW (ref 12.0–15.0)
Immature Granulocytes: 0 %
Lymphocytes Relative: 22 %
Lymphs Abs: 2.2 10*3/uL (ref 0.7–4.0)
MCH: 26.3 pg (ref 26.0–34.0)
MCHC: 30.8 g/dL (ref 30.0–36.0)
MCV: 85.2 fL (ref 80.0–100.0)
Monocytes Absolute: 0.7 10*3/uL (ref 0.1–1.0)
Monocytes Relative: 7 %
Neutro Abs: 6.7 10*3/uL (ref 1.7–7.7)
Neutrophils Relative %: 70 %
Platelets: 275 10*3/uL (ref 150–400)
RBC: 4.19 MIL/uL (ref 3.87–5.11)
RDW: 15.8 % — ABNORMAL HIGH (ref 11.5–15.5)
WBC: 9.8 10*3/uL (ref 4.0–10.5)
nRBC: 0 % (ref 0.0–0.2)

## 2020-11-08 LAB — IRON AND TIBC
Iron: 80 ug/dL (ref 28–170)
Saturation Ratios: 29 % (ref 10.4–31.8)
TIBC: 277 ug/dL (ref 250–450)
UIBC: 197 ug/dL

## 2020-11-08 LAB — FERRITIN: Ferritin: 276 ng/mL (ref 11–307)

## 2020-11-14 NOTE — Progress Notes (Signed)
11/15/2020 10:41 AM   [name] Cooper [birth year] [id number]  Referring provider: [name], MD [address] [place],  [place] [address]  Urological history: 1. rUTI's -contributing factors of age and vaginal atrophy -RUS 10/14/2019 bilateral renal cysts, but no hydronephrosis or stones -cysto 08/2020 NED -no etiology on CT urogram in 09/2020  -managed with vaginal estrogen cream, cranberry tablets -documented positive urine cultures over the last year   E. Coli UTI resistant to ampicillin, ciprofloxacin, gentamicin and levofloxacin on 11/05/2019  E. Coli UTI resistant to ampicillin, ciprofloxacin, levofloxacin, tetracycline and trimethoprim/sulfa on 11/18/2019  E. Coli UTI resistant to ampicillin, ciprofloxacin, levofloxacin, tetracycline and trimethoprim/sulfa on 12/04/2019  E. coli UTI resistant to ampicillin, ciprofloxacin, gentamicin, levofloxacin, tetracycline and trimethoprim/sulfa on December 23, 2019  Klebsiella pneumoniae resistant to ampicillin on June 17, 2020  Streptococcus anginosus group on Aug 01, 2020  E.coli resistant to ampicillin, ciprofloxacin, levofloxacin, tetracycline and trimethoprim/sulfa on 09/02/2020   E.coli resistant to ampicillin, ciprofloxacin, levofloxacin, tetracycline and trimethoprim/sulfa and Klebsiella pneumoniae resistant ampicillin and nitrofurantoin 09/22/2020  E.coli resistant to ampicillin, ciprofloxacin, gentamicin, levofloxacin, tobramycin and trimethoprim/sulfa on October 21, 2020   2. Vaginal atrophy -managed with vaginal estrogen cream three nights weekly  3. Nephrolithiasis -CT urogram 09/2020 stable nonobstructing calculi in the interpolar region of the right kidney.  4. Bilateral renal cysts -CT urogram 09/2020 -complex cysts bilaterally slightly enlarged, but unchanged since 2015  Chief Complaint  Patient presents with   Follow-up    HPI: [name] Cooper is a 80 y.o. female who presents today for  CT scan results   CT urogram 09/2020 - Nonobstructing right renal calculi. No evidence of ureteral calculus or hydronephrosis.  No suspicious renal mass or urothelial lesion. Simple and mildly complex (Bosniak 1 and 2) cystic lesions bilaterally, some mildly enlarged from 2015.  She has no urinary complaints at this visit.  Patient denies any modifying or aggravating factors.  Patient denies any gross hematuria or flank pain.  Patient denies any fevers, chills, nausea or vomiting.    She continues to have issues with memory.   She also continues to experience dysuria pain.    She states she is out of her vaginal estrogen cream.    UA greater than 30 WBCs, 3-10 RBCs and many bacteria  PMH: Past Medical History:  Diagnosis Date   Anemia    Arthritis    left hip, left ankle   Cholelithiases    CKD (chronic kidney disease), stage II    Coronary artery disease    a. cardiac cath 01/2011: mid LAD stent patent, mild lumenal irreg's, nl EF 55%, no AS/MR   Fibromyalgia    Frozen shoulder    History of kidney stones    HTN (hypertension)    controlled with medication;    Hyperlipidemia    a. statin intolerant   Iron deficiency    Thrombocytopenia (HCC)    Ulcer of left lower extremity, limited to breakdown of skin (HCC)    UTI (urinary tract infection)    Venous stasis     Surgical History: Past Surgical History:  Procedure Laterality Date   CARDIAC CATHETERIZATION     stents placed in mid LAD   CERVICAL SPINE SURGERY     FETAL BLOOD TRANSFUSION  2014   Frozen Shoulder     KIDNEY STONE SURGERY     LEG SURGERY     broke her leg    LOWER EXTREMITY ANGIOGRAPHY Right 02/17/2019  Procedure: LOWER EXTREMITY ANGIOGRAPHY;  Surgeon: Renford DillsSchnier, Gregory G, MD;  Location: [facility];  Service: Cardiovascular;  Laterality: Right;   LOWER EXTREMITY ANGIOGRAPHY Left 03/18/2019   Procedure: LOWER EXTREMITY ANGIOGRAPHY;  Surgeon: Renford DillsSchnier, Gregory G, MD;  Location: [facility]  [facility];  Service: Cardiovascular;  Laterality: Left;    Home Medications:  Allergies as of 11/15/2020       Reactions   Sulfa Antibiotics Anaphylaxis        Medication List        Accurate as of November 15, 2020 10:41 AM. If you have any questions, ask your nurse or doctor.          STOP taking these medications    amoxicillin-clavulanate 875-125 MG tablet Commonly known as: AUGMENTIN Stopped by: Kiasha Bellin, PA-C       TAKE these medications    atorvastatin 20 MG tablet Commonly known as: LIPITOR TAKE 1 TABLET(20 MG) BY MOUTH DAILY   clopidogrel 75 MG tablet Commonly known as: Plavix Take 1 tablet (75 mg total) by mouth daily.   estradiol 0.1 MG/GM vaginal cream Commonly known as: ESTRACE Apply a pea size amount Monday, Wednesday, and Friday night   ezetimibe 10 MG tablet Commonly known as: ZETIA Take 1 tablet (10 mg total) by mouth daily.   losartan-hydrochlorothiazide 100-25 MG tablet Commonly known as: HYZAAR Take 1 tablet by mouth daily.   meloxicam 7.5 MG tablet Commonly known as: Mobic Take 1 tablet (7.5 mg total) by mouth daily.   nitroGLYCERIN 0.4 MG SL tablet Commonly known as: NITROSTAT Place 1 tablet (0.4 mg total) under the tongue every 5 (five) minutes as needed for chest pain.   temazepam 30 MG capsule Commonly known as: RESTORIL Take 30 mg by mouth at bedtime.        Allergies:  Allergies  Allergen Reactions   Sulfa Antibiotics Anaphylaxis    Family History: Family History  Problem Relation Age of Onset   Hypertension Mother    Heart attack Mother    Heart attack Father    Heart disease Brother        h/o CABG   Diabetes Brother    Kidney disease Brother    Kidney cancer Neg Hx    Bladder Cancer Neg Hx    Breast cancer Neg Hx     Social History:  reports that she has never smoked. She has never used smokeless tobacco. She reports that she does not drink alcohol and does not use drugs.  ROS: For pertinent  review of systems please refer to history of present illness  Physical Exam: BP (!) 163/76   Pulse 62   Ht 5\' 8"  (1.727 m)   Wt 176 lb 4.8 oz (80 kg)   BMI 26.81 kg/m   Constitutional:  Well nourished. Alert and oriented, No acute distress. HEENT: [place] AT, moist mucus membranes.  Trachea midline, no masses. Cardiovascular: No clubbing, cyanosis, or edema. Respiratory: Normal respiratory effort, no increased work of breathing. Neurologic: Grossly intact, no focal deficits, moving all 4 extremities. Psychiatric: Normal mood and affect.     Laboratory Data: Component     Latest Ref Rng & Units 11/08/2020          WBC     4.0 - 10.5 K/uL 9.8  RBC     3.87 - 5.11 MIL/uL 4.19  Hemoglobin     12.0 - 15.0 g/dL 16.111.0 (L)  HCT     09.636.0 - 46.0 % 35.7 (  L)  MCV     80.0 - 100.0 fL 85.2  MCH     26.0 - 34.0 pg 26.3  MCHC     30.0 - 36.0 g/dL 16.1  RDW     09.6 - 04.5 % 15.8 (H)  Platelets     150 - 400 K/uL 275  nRBC     0.0 - 0.2 % 0.0  Neutrophils     % 70  NEUT#     1.7 - 7.7 K/uL 6.7  Lymphocytes     % 22  Lymphocyte #     0.7 - 4.0 K/uL 2.2  Monocytes Relative     % 7  Monocyte #     0.1 - 1.0 K/uL 0.7  Eosinophil     % 1  Eosinophils Absolute     0.0 - 0.5 K/uL 0.1  Basophil     % 0  Basophils Absolute     0.0 - 0.1 K/uL 0.0  Immature Granulocytes     % 0  Abs Immature Granulocytes     0.00 - 0.07 K/uL 0.03  WBC, UA     0 - 5 /hpf   Epithelial Cells (non renal)     0 - 10 /hpf   Casts     None seen /lpf   Cast Type     N/A   Crystals     N/A   Crystal Type     N/A   Bacteria, UA     None seen/Few   Renal Epithel, UA     None seen /hpf    Urinalysis Component     Latest Ref Rng & Units 11/15/2020  Specific Gravity, UA     1.005 - 1.030 1.020  pH, UA     5.0 - 7.5 6.5  Color, UA     Yellow Yellow  Appearance Ur     Clear Cloudy (A)  Leukocytes,UA     Negative 2+ (A)  Protein,UA     Negative/Trace 1+ (A)  Glucose, UA     Negative  Negative  Ketones, UA     Negative Negative  RBC, UA     Negative 1+ (A)  Bilirubin, UA     Negative Negative  Urobilinogen, Ur     0.2 - 1.0 mg/dL 0.2  Nitrite, UA     Negative Negative  Microscopic Examination      See below:   Component     Latest Ref Rng & Units 11/15/2020          WBC, UA     0 - 5 /hpf >30 (A)  RBC     0 - 2 /hpf 3-10 (A)  Epithelial Cells (non renal)     0 - 10 /hpf 0-10  Bacteria, UA     None seen/Few Many (A)   I have reviewed the labs.   Pertinent Imaging:  CLINICAL DATA:  Recurrent urinary tract infections with gross hematuria. Recent antibiotic therapy. History of vaginal atrophy and nephrolithiasis.   EXAM: CT ABDOMEN AND PELVIS WITHOUT AND WITH CONTRAST   TECHNIQUE: Multidetector CT imaging of the abdomen and pelvis was performed following the standard protocol before and following the bolus administration of intravenous contrast.   CONTRAST:  43mL OMNIPAQUE IOHEXOL 350 MG/ML SOLN   COMPARISON:  Renal ultrasound 10/14/2019. Noncontrast CT 12/28/2013.   FINDINGS: Lower chest: Stable mild linear scarring at the left lung base. The lung bases are otherwise clear. No significant pleural or  pericardial effusion.   Hepatobiliary: The liver is normal in density without suspicious focal abnormality. No significant biliary dilatation post cholecystectomy.   Pancreas: Unremarkable. No pancreatic ductal dilatation or surrounding inflammatory changes.   Spleen: Normal in size without focal abnormality.   Adrenals/Urinary Tract: Both adrenal glands appear normal. Pre contrast images demonstrate stable nonobstructing calculi in the interpolar region of the right kidney. No evidence of ureteral or bladder calculus. Post-contrast, both kidneys enhance normally. There is no evidence of enhancing renal mass. There are multiple simple and complex cystic renal lesions bilaterally. A lesion in the anterior interpolar region of the right  kidney demonstrates thin partially calcified septations and measures 2.9 x 2.4 cm on image 25/4. This demonstrates no solid components or significant change from the previous study. A similarly complex lesion in the upper pole of the left kidney demonstrates thin partially calcified septations and measures 4.5 x 4.0 cm on image 18/4, also similar to the previous study. Some of the simple cysts have mildly enlarged. Delayed images result in segmental visualization of the ureters. No focal upper tract urothelial abnormalities are identified. The bladder appears unremarkable.   Stomach/Bowel: No enteric contrast administered. The stomach appears unremarkable for its degree of distension. No evidence of bowel wall thickening, distention or surrounding inflammatory change.   Vascular/Lymphatic: There are no enlarged abdominal or pelvic lymph nodes. Aortic and branch vessel atherosclerosis without aneurysm or large vessel occlusion.   Reproductive: Grossly stable calcified and noncalcified uterine fibroids. No suspicious adnexal findings.   Other: No evidence of abdominal wall mass or hernia. No ascites.   Musculoskeletal: No acute or significant osseous findings. Stable mild lumbar spine facet arthropathy.   IMPRESSION: 1. Nonobstructing right renal calculi. No evidence of ureteral calculus or hydronephrosis. 2. No suspicious renal mass or urothelial lesion. Simple and mildly complex (Bosniak 1 and 2) cystic lesions bilaterally, some mildly enlarged from 2015. 3. Grossly stable calcified and noncalcified uterine fibroids. 4.  Aortic Atherosclerosis (ICD10-I70.0).     Electronically Signed   By: Carey Bullocks M.D.   On: 10/14/2020 11:09 I have independently reviewed the films.  See HPI.    Assessment & Plan:    1. Recurrent UTI's  -continues to have issues with dysuria -UA with pyuria and many bacteria -Urine sent for culture -We will wait to prescribe antibiotic once  urine culture results are available -I am hesitant to start a prophylactic antibiotic at this time as she has memory issues and will likely not take it correctly                                   2. Vaginal atrophy -continue the vaginal estrogen cream Monday, Wednesday and Friday nights -Difficulty is consistently due to memory loss  3.  Nephrolithiasis -Right lower pole stones -No intervention warranted at this time                                      4. Memory loss -may also be contributing to patient's rUTI's as she may be forgetful concerning perineal hygiene, applying the vaginal estrogen cream, engaging in strategic UTI prevention behaviors and/or not taking antibiotics as prescribed -Has upcoming appointments with neurologist  5.  Gross hematuria -resolved -CT urogram and cysto completed - findings for bilaterally complex cysts Bosniak 2  6. Complex renal cysts -  RUS in 6 months     Return in about 6 months (around 05/18/2021) for RUS .  These notes generated with voice recognition software. I apologize for typographical errors.  Michiel Cowboy, PA-C  [facility] [address] [place], [place] [address] [phone]

## 2020-11-15 ENCOUNTER — Ambulatory Visit: Payer: Medicare Other | Admitting: Urology

## 2020-11-15 ENCOUNTER — Other Ambulatory Visit: Payer: Self-pay

## 2020-11-15 ENCOUNTER — Encounter: Payer: Self-pay | Admitting: Urology

## 2020-11-15 VITALS — BP 163/76 | HR 62 | Ht 68.0 in | Wt 176.3 lb

## 2020-11-15 DIAGNOSIS — R413 Other amnesia: Secondary | ICD-10-CM | POA: Diagnosis not present

## 2020-11-15 DIAGNOSIS — N200 Calculus of kidney: Secondary | ICD-10-CM | POA: Diagnosis not present

## 2020-11-15 DIAGNOSIS — R310 Gross hematuria: Secondary | ICD-10-CM

## 2020-11-15 DIAGNOSIS — N390 Urinary tract infection, site not specified: Secondary | ICD-10-CM | POA: Diagnosis not present

## 2020-11-15 DIAGNOSIS — N952 Postmenopausal atrophic vaginitis: Secondary | ICD-10-CM | POA: Diagnosis not present

## 2020-11-15 DIAGNOSIS — Q6102 Congenital multiple renal cysts: Secondary | ICD-10-CM

## 2020-11-15 LAB — URINALYSIS, COMPLETE
Bilirubin, UA: NEGATIVE
Glucose, UA: NEGATIVE
Ketones, UA: NEGATIVE
Nitrite, UA: NEGATIVE
Specific Gravity, UA: 1.02 (ref 1.005–1.030)
Urobilinogen, Ur: 0.2 mg/dL (ref 0.2–1.0)
pH, UA: 6.5 (ref 5.0–7.5)

## 2020-11-15 LAB — MICROSCOPIC EXAMINATION: WBC, UA: >30 /hpf — AB (ref 0–5)

## 2020-11-15 MED ORDER — ESTRADIOL 0.1 MG/GM VA CREA: TOPICAL_CREAM | VAGINAL | 12 refills | Status: DC

## 2020-11-17 ENCOUNTER — Other Ambulatory Visit: Payer: Self-pay | Admitting: Neurology

## 2020-11-17 DIAGNOSIS — F03A0 Unspecified dementia, mild, without behavioral disturbance, psychotic disturbance, mood disturbance, and anxiety: Secondary | ICD-10-CM

## 2020-11-17 DIAGNOSIS — F0390 Unspecified dementia without behavioral disturbance: Secondary | ICD-10-CM

## 2020-11-18 ENCOUNTER — Telehealth: Payer: Self-pay | Admitting: Urology

## 2020-11-18 LAB — CULTURE, URINE COMPREHENSIVE

## 2020-11-18 NOTE — Telephone Encounter (Signed)
She can take AZO for three days.

## 2020-11-18 NOTE — Telephone Encounter (Signed)
Patient called and asked what she can do to help with the painful urination until her culture results come back.   [name] Cooper

## 2020-11-18 NOTE — Telephone Encounter (Signed)
Spoke with patient and advised results   

## 2020-11-21 ENCOUNTER — Other Ambulatory Visit: Payer: Self-pay | Admitting: *Deleted

## 2020-11-21 MED ORDER — CEFUROXIME AXETIL 500 MG PO TABS: 500.0000 mg | ORAL_TABLET | Freq: Two times a day (BID) | ORAL | 0 refills | Status: AC

## 2020-11-24 ENCOUNTER — Ambulatory Visit: Payer: Self-pay | Admitting: Urology

## 2020-11-25 ENCOUNTER — Other Ambulatory Visit: Payer: Self-pay

## 2020-11-25 DIAGNOSIS — D631 Anemia in chronic kidney disease: Secondary | ICD-10-CM

## 2020-11-29 ENCOUNTER — Inpatient Hospital Stay: Payer: Medicare Other

## 2020-11-29 ENCOUNTER — Inpatient Hospital Stay (HOSPITAL_BASED_OUTPATIENT_CLINIC_OR_DEPARTMENT_OTHER): Payer: Medicare Other | Admitting: Oncology

## 2020-11-29 ENCOUNTER — Encounter: Payer: Self-pay | Admitting: Oncology

## 2020-11-29 ENCOUNTER — Other Ambulatory Visit: Payer: Self-pay | Admitting: *Deleted

## 2020-11-29 ENCOUNTER — Inpatient Hospital Stay: Payer: Medicare Other | Attending: Oncology

## 2020-11-29 ENCOUNTER — Other Ambulatory Visit: Payer: Self-pay

## 2020-11-29 VITALS — BP 161/51 | HR 62 | Temp 98.7°F | Wt 174.4 lb

## 2020-11-29 DIAGNOSIS — Z87440 Personal history of urinary (tract) infections: Secondary | ICD-10-CM | POA: Insufficient documentation

## 2020-11-29 DIAGNOSIS — N189 Chronic kidney disease, unspecified: Secondary | ICD-10-CM

## 2020-11-29 DIAGNOSIS — R300 Dysuria: Secondary | ICD-10-CM | POA: Insufficient documentation

## 2020-11-29 DIAGNOSIS — N182 Chronic kidney disease, stage 2 (mild): Secondary | ICD-10-CM | POA: Diagnosis not present

## 2020-11-29 DIAGNOSIS — N183 Chronic kidney disease, stage 3 unspecified: Secondary | ICD-10-CM | POA: Insufficient documentation

## 2020-11-29 DIAGNOSIS — D631 Anemia in chronic kidney disease: Secondary | ICD-10-CM | POA: Diagnosis not present

## 2020-11-29 LAB — CBC WITH DIFFERENTIAL/PLATELET
Abs Immature Granulocytes: 0.04 10*3/uL (ref 0.00–0.07)
Basophils Absolute: 0 10*3/uL (ref 0.0–0.1)
Basophils Relative: 0 %
Eosinophils Absolute: 0.1 10*3/uL (ref 0.0–0.5)
Eosinophils Relative: 2 %
HCT: 34.9 % — ABNORMAL LOW (ref 36.0–46.0)
Hemoglobin: 11 g/dL — ABNORMAL LOW (ref 12.0–15.0)
Immature Granulocytes: 1 %
Lymphocytes Relative: 22 %
Lymphs Abs: 1.9 10*3/uL (ref 0.7–4.0)
MCH: 27 pg (ref 26.0–34.0)
MCHC: 31.5 g/dL (ref 30.0–36.0)
MCV: 85.5 fL (ref 80.0–100.0)
Monocytes Absolute: 0.6 10*3/uL (ref 0.1–1.0)
Monocytes Relative: 7 %
Neutro Abs: 6 10*3/uL (ref 1.7–7.7)
Neutrophils Relative %: 68 %
Platelets: 235 10*3/uL (ref 150–400)
RBC: 4.08 MIL/uL (ref 3.87–5.11)
RDW: 14.3 % (ref 11.5–15.5)
WBC: 8.7 10*3/uL (ref 4.0–10.5)
nRBC: 0 % (ref 0.0–0.2)

## 2020-11-29 LAB — URINALYSIS, COMPLETE (UACMP) WITH MICROSCOPIC
Bilirubin Urine: NEGATIVE
Glucose, UA: NEGATIVE mg/dL
Ketones, ur: NEGATIVE mg/dL
Nitrite: NEGATIVE
Protein, ur: 30 mg/dL — AB
Specific Gravity, Urine: 1.015 (ref 1.005–1.030)
pH: 5 (ref 5.0–8.0)

## 2020-11-29 LAB — IRON AND TIBC
Iron: 77 ug/dL (ref 28–170)
Saturation Ratios: 30 % (ref 10.4–31.8)
TIBC: 253 ug/dL (ref 250–450)
UIBC: 176 ug/dL

## 2020-11-29 LAB — FERRITIN: Ferritin: 378 ng/mL — ABNORMAL HIGH (ref 11–307)

## 2020-11-29 NOTE — Progress Notes (Signed)
Hematology/Oncology Consult note [facility]  Telephone:([phone] [phone]  Patient Care Team: [name], MD as PCP - General (Family Medicine) Minna Merritts, MD as PCP - Cardiology (Cardiology) Diona Browner Paulino Door, MD as Referring Physician (Orthopedic Surgery) Minna Merritts, MD as Consulting Physician (Cardiology)   Name of the patient: [name] Cooper  [id number]  February 28, 1941   Date of visit: 11/29/20  Diagnosis- normocytic anemia likely secondary to chronic kidney disease  Chief complaint/ Reason for visit-routine follow-up of anemia of kidney disease  Heme/Onc history:  Patient is a 79 year old female who was seen by Dr. Sherrine Maples in the past for evaluation of anemia. Her hemoglobin has been ranging between 9.9-10.8 over the last couple of years. She is also noted to have chronic kidney disease with a creatinine ranging from 1.5-2. 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   Review of blood work from October 2017 was as follows:  methylmalonic acid was elevated at 778. TIBC was low and ferritin was elevated at 399. spep showed polyclonal gammopathy. Cbc showed white count of 9.2, H&H of 10.1/30.6 and a platelet count of 297. Reticulocyte count was low at 0.8 in appropriate for the degree of anemia. Normal immunofixation pattern was noted in urine   Patient was started on monthly B12 injections given elevated MMA   Results of blood work from 02/11/2020 were as follows: CBC showed white count of 12.6, H&H of 11/35.2 with an MCV of 86 and a platelet count of 279.  Reticulocyte count was mildly low for the degree of anemia at 0.8.  Haptoglobin elevated TSH was normal.  Myeloma panel showed polyclonal increase in immunoglobulins.  Serum free light chain ratio was mildly abnormal at 1.9.  TSH was normal at 2.1.  ESR was elevated at 80.  CMP showed creatinine of  1.6.  ANA comprehensive panel showed mildly elevated ENA IgG antibody of 1.7.   Patient is presently receiving Retacrit every 3 weeks  Interval history-she was recently diagnosed with an e-coli UTI and is unsure if she was given an antibiotic.  She was seen by urology on 11/15/2020 for cystoscopy which appeared normal.  She was having some dysuria and had a UA and urine culture performed.  Urine culture grew greater than 100,000 colonies of E. Coli.  S and he was also asked to take AZO for dysuria and a prescription was sent for Ceftin 500 mg twice daily for 7 days.  She remembers taking the medication that starts with an A but only once a day.  Reports persistent dysuria and increased urinary frequency.  Otherwise, she is doing well.  ECOG PS- 1 Pain scale- 0   Review of systems- Review of Systems  Genitourinary:  Positive for dysuria, frequency and urgency.  Psychiatric/Behavioral:  Positive for memory loss.      Allergies  Allergen Reactions   Sulfa Antibiotics Anaphylaxis     Past Medical History:  Diagnosis Date   Anemia    Arthritis    left hip, left ankle   Cholelithiases    CKD (chronic kidney disease), stage II    Coronary artery disease    a. cardiac cath 01/2011: mid LAD stent patent, mild lumenal irreg's, nl EF 55%, no AS/MR   Fibromyalgia    Frozen shoulder    History of kidney stones    HTN (hypertension)    controlled with medication;  Hyperlipidemia    a. statin intolerant   Iron deficiency    Thrombocytopenia (HCC)    Ulcer of left lower extremity, limited to breakdown of skin (HCC)    UTI (urinary tract infection)    Venous stasis      Past Surgical History:  Procedure Laterality Date   CARDIAC CATHETERIZATION     stents placed in mid LAD   CERVICAL SPINE SURGERY     FETAL BLOOD TRANSFUSION  2014   Frozen Shoulder     KIDNEY STONE SURGERY     LEG SURGERY     broke her leg    LOWER EXTREMITY ANGIOGRAPHY Right 02/17/2019   Procedure: LOWER  EXTREMITY ANGIOGRAPHY;  Surgeon: Katha Cabal, MD;  Location: [facility];  Service: Cardiovascular;  Laterality: Right;   LOWER EXTREMITY ANGIOGRAPHY Left 03/18/2019   Procedure: LOWER EXTREMITY ANGIOGRAPHY;  Surgeon: Katha Cabal, MD;  Location: [place] [facility];  Service: Cardiovascular;  Laterality: Left;    Social History   Socioeconomic History   Marital status: Widowed    Spouse name: Not on file   Number of children: 3   Years of education: Not on file   Highest education level: Bachelor's degree (e.g., BA, AB, BS)  Occupational History   Not on file  Tobacco Use   Smoking status: Never   Smokeless tobacco: Never  Vaping Use   Vaping Use: Never used  Substance and Sexual Activity   Alcohol use: No   Drug use: No   Sexual activity: Not Currently  Other Topics Concern   Not on file  Social History Narrative   Not on file   Social Determinants of Health   Financial Resource Strain: Not on file  Food Insecurity: Not on file  Transportation Needs: Not on file  Physical Activity: Not on file  Stress: Not on file  Social Connections: Not on file  Intimate Partner Violence: Not on file    Family History  Problem Relation Age of Onset   Hypertension Mother    Heart attack Mother    Heart attack Father    Heart disease Brother        h/o CABG   Diabetes Brother    Kidney disease Brother    Kidney cancer Neg Hx    Bladder Cancer Neg Hx    Breast cancer Neg Hx      Current Outpatient Medications:    atorvastatin (LIPITOR) 20 MG tablet, TAKE 1 TABLET(20 MG) BY MOUTH DAILY, Disp: 90 tablet, Rfl: 3   clopidogrel (PLAVIX) 75 MG tablet, Take 1 tablet (75 mg total) by mouth daily., Disp: 90 tablet, Rfl: 4   estradiol (ESTRACE) 0.1 MG/GM vaginal cream, Apply a pea size amount Monday, Wednesday, and Friday night, Disp: 42.5 g, Rfl: 12   losartan-hydrochlorothiazide (HYZAAR) 100-25 MG tablet, Take 1 tablet by mouth daily., Disp: 90 tablet, Rfl:  3   meloxicam (MOBIC) 7.5 MG tablet, Take 1 tablet (7.5 mg total) by mouth daily., Disp: 30 tablet, Rfl: 0   nitroGLYCERIN (NITROSTAT) 0.4 MG SL tablet, Place 1 tablet (0.4 mg total) under the tongue every 5 (five) minutes as needed for chest pain., Disp: 25 tablet, Rfl: 3   temazepam (RESTORIL) 30 MG capsule, Take 30 mg by mouth at bedtime. , Disp: , Rfl:    ezetimibe (ZETIA) 10 MG tablet, Take 1 tablet (10 mg total) by mouth daily., Disp: 90 tablet, Rfl: 3 No current facility-administered medications for this visit.  Facility-Administered  Medications Ordered in Other Visits:    epoetin alfa-epbx (RETACRIT) injection 40,000 Units, 40,000 Units, Subcutaneous, Q90 days, Sindy Guadeloupe, MD, 40,000 Units at 07/25/20 1113   epoetin alfa-epbx (RETACRIT) injection 40,000 Units, 40,000 Units, Subcutaneous, Once, Sindy Guadeloupe, MD  Physical exam:  Vitals:   11/29/20 1016  BP: (!) 161/51  Pulse: 62  Temp: 98.7 F (37.1 C)  Weight: 174 lb 6.4 oz (79.1 kg)   Physical Exam Constitutional:      Appearance: Normal appearance.  HENT:     Head: Normocephalic and atraumatic.  Eyes:     Pupils: Pupils are equal, round, and reactive to light.  Cardiovascular:     Rate and Rhythm: Normal rate and regular rhythm.     Heart sounds: Normal heart sounds. No murmur heard. Pulmonary:     Effort: Pulmonary effort is normal.     Breath sounds: Normal breath sounds. No wheezing.  Abdominal:     General: Bowel sounds are normal. There is no distension.     Palpations: Abdomen is soft.     Tenderness: There is no abdominal tenderness.  Musculoskeletal:        General: Normal range of motion.     Cervical back: Normal range of motion.  Skin:    General: Skin is warm and dry.     Findings: No rash.  Neurological:     Mental Status: She is alert and oriented to person, place, and time.  Psychiatric:        Judgment: Judgment normal.     CMP Latest Ref Rng & Units 10/13/2020  Glucose 70 - 99 mg/dL -   BUN 8 - 23 mg/dL -  Creatinine 0.44 - 1.00 mg/dL 1.50(H)  Sodium 135 - 145 mmol/L -  Potassium 3.5 - 5.1 mmol/L -  Chloride 98 - 111 mmol/L -  CO2 22 - 32 mmol/L -  Calcium 8.9 - 10.3 mg/dL -  Total Protein 6.5 - 8.1 g/dL -  Total Bilirubin 0.3 - 1.2 mg/dL -  Alkaline Phos 38 - 126 U/L -  AST 15 - 41 U/L -  ALT 0 - 44 U/L -   CBC Latest Ref Rng & Units 11/29/2020  WBC 4.0 - 10.5 K/uL 8.7  Hemoglobin 12.0 - 15.0 g/dL 11.0(L)  Hematocrit 36.0 - 46.0 % 34.9(L)  Platelets 150 - 400 K/uL 235     Assessment and plan- Patient is a 80 y.o. female with anemia of chronic kidney disease here for routine follow-up.  Anemia due to CKD- She last received 40,000 units Retacrit on 10/18/2020.  Her hemoglobin has been maintaining around 11 for the past several months.  Labs from today show hemoglobin of 11.  She does not need additional Retacrit today.  RTC as scheduled every 3 weeks for lab work and possible Retacrit and will see Dr. Janese Banks back in 3 months.  E. coli UTI- She is followed by urology and was recently prescribed Ceftin 500 mg twice daily for 7 days.  It is unclear if she has actually taken this medication.  States she is taking a medication that begins with an A.  This is probably AZO and not Ceftin.  Reports persistent symptoms including frequency and dysuria.  Patient will call clinic when she gets home to let us know what medication she has been taking.  Regardless, she has likely not been taking the antibiotic correctly if at all given this was more than 12 days ago and she was given a  7-day supply.  We will recheck UA and urine culture today in clinic for to let us know her medications.  I spent 25 minutes dedicated to the care of this patient (face-to-face and non-face-to-face) on the date of the encounter to include what is described in the assessment and plan.  Visit Diagnosis 1. Anemia of chronic kidney failure, unspecified stage   2. Dysuria    Faythe Casa, NP 11/29/2020  12:13 PM

## 2020-11-30 LAB — URINE CULTURE

## 2020-12-02 ENCOUNTER — Ambulatory Visit: Payer: Medicare Other

## 2020-12-20 ENCOUNTER — Inpatient Hospital Stay: Payer: Medicare Other

## 2020-12-20 ENCOUNTER — Telehealth: Payer: Self-pay | Admitting: Urology

## 2020-12-20 VITALS — BP 144/83 | HR 61

## 2020-12-20 DIAGNOSIS — D631 Anemia in chronic kidney disease: Secondary | ICD-10-CM

## 2020-12-20 DIAGNOSIS — N183 Chronic kidney disease, stage 3 unspecified: Secondary | ICD-10-CM | POA: Diagnosis not present

## 2020-12-20 DIAGNOSIS — N189 Chronic kidney disease, unspecified: Secondary | ICD-10-CM

## 2020-12-20 LAB — CBC WITH DIFFERENTIAL/PLATELET
Abs Immature Granulocytes: 0.04 10*3/uL (ref 0.00–0.07)
Basophils Absolute: 0 10*3/uL (ref 0.0–0.1)
Basophils Relative: 0 %
Eosinophils Absolute: 0.2 10*3/uL (ref 0.0–0.5)
Eosinophils Relative: 2 %
HCT: 34.1 % — ABNORMAL LOW (ref 36.0–46.0)
Hemoglobin: 10.4 g/dL — ABNORMAL LOW (ref 12.0–15.0)
Immature Granulocytes: 0 %
Lymphocytes Relative: 21 %
Lymphs Abs: 1.9 10*3/uL (ref 0.7–4.0)
MCH: 26.6 pg (ref 26.0–34.0)
MCHC: 30.5 g/dL (ref 30.0–36.0)
MCV: 87.2 fL (ref 80.0–100.0)
Monocytes Absolute: 0.6 10*3/uL (ref 0.1–1.0)
Monocytes Relative: 7 %
Neutro Abs: 6.5 10*3/uL (ref 1.7–7.7)
Neutrophils Relative %: 70 %
Platelets: 279 10*3/uL (ref 150–400)
RBC: 3.91 MIL/uL (ref 3.87–5.11)
RDW: 14.3 % (ref 11.5–15.5)
WBC: 9.3 10*3/uL (ref 4.0–10.5)
nRBC: 0 % (ref 0.0–0.2)

## 2020-12-20 MED ORDER — EPOETIN ALFA-EPBX 40000 UNIT/ML IJ SOLN
40000.0000 [IU] | Freq: Once | INTRAMUSCULAR | Status: AC
  Administered 2020-12-20: 40000 [IU] via SUBCUTANEOUS
  Filled 2020-12-20: qty 1

## 2020-12-20 NOTE — Telephone Encounter (Signed)
We make an make .

## 2020-12-20 NOTE — Telephone Encounter (Signed)
Pt LMOM that she's in a lot of pain from UTI.  She wants Carollee Herter to call her back ASAP.

## 2020-12-21 ENCOUNTER — Other Ambulatory Visit: Payer: Self-pay | Admitting: Cardiovascular Disease

## 2020-12-21 NOTE — Progress Notes (Signed)
01/09/21 3:19 PM   [name] Cooper [birth year] [id number]  Referring provider:  [name], MD [address] [place],  [place] [address]  Chief Complaint  Patient presents with   Recurrent UTI    Urological history  1. rUTI's -contributing factors of age and vaginal atrophy -RUS 10/14/2019 bilateral renal cysts, but no hydronephrosis or stones -cysto 08/2020 NED -managed with vaginal estrogen cream, cranberry tablets -documented positive urine cultures over the last year              E. coli UTI resistant to ampicillin, ciprofloxacin, gentamicin, levofloxacin, tetracycline and trimethoprim/sulfa on December 23, 2019             Klebsiella pneumoniae resistant to ampicillin on June 17, 2020             Streptococcus anginosus group on Aug 01, 2020             E.coli resistant to ampicillin, ciprofloxacin, levofloxacin, tetracycline and trimethoprim/sulfa on 09/02/2020              E.coli resistant to ampicillin, ciprofloxacin, levofloxacin, tetracycline and trimethoprim/sulfa and Klebsiella pneumoniae resistant ampicillin and nitrofurantoin 09/22/2020  E.coli resistant to ampicillin, ciprofloxacin, gentamicin, levofloxacin, tobramycin and trimethoprim/sulfa on October 21, 2020  E.coli resistant to ampicillin, ciprofloxacin, levofloxacin, tetracycline and trimethoprim/sulfa on November 15, 2020        2. Vaginal atrophy -managed with vaginal estrogen cream three nights weekly   3. Nephrolithiasis -KUB 08/2017 right renal stones   4. Bilateral renal cysts -CTU 09/2020 - There are multiple simple and complex cystic renal lesions bilaterally. A lesion in the anterior interpolar region of the right kidney demonstrates thin partially calcified septations and measures 2.9 x 2.4 cm on image 25/4. This demonstrates no solid components or significant change from the previous study. A similarly complex lesion in the upper pole of the left kidney demonstrates thin partially  calcified septations and measures 4.5 x 4.0 cm on image 18/4, also similar to the previous study. Some of the simple cysts have mildly enlarged.   HPI: [name] Cooper is a 80 y.o.female who presents today for further evaluation of a UTI.   Recent urinalysis on 11/29/2020 showed trace blood, moderate leukocytes, and rare bacteria. Urine culture showed multiple species present.   She has lower back pain as well as pain during urination for several days.  Patient denies any modifying or aggravating factors.  Patient denies any gross hematuria or suprapubic/flank pain.  Patient denies any fevers, chills, nausea or vomiting.    CATH UA  >30 WBCs and many bacteria.   In and Out Catheterization  Patient is present today for a I & O catheterization due to presumed UTI. Patient was cleaned and prepped in a sterile fashion with betadine . A 14 FR cath was inserted no complications were noted , 50 ml of urine return was noted, urine was yellow in color. A clean urine sample was collected for urinalysis and urine culture . Bladder was drained  And catheter was removed with out difficulty.    PMH: Past Medical History:  Diagnosis Date   Anemia    Arthritis    left hip, left ankle   Cholelithiases    CKD (chronic kidney disease), stage II    Coronary artery disease    a. cardiac cath 01/2011: mid LAD stent patent, mild lumenal irreg's, nl EF 55%, no AS/MR   Fibromyalgia    Frozen shoulder    History of  kidney stones    HTN (hypertension)    controlled with medication;    Hyperlipidemia    a. statin intolerant   Iron deficiency    Thrombocytopenia (HCC)    Ulcer of left lower extremity, limited to breakdown of skin (HCC)    UTI (urinary tract infection)    Venous stasis     Surgical History: Past Surgical History:  Procedure Laterality Date   CARDIAC CATHETERIZATION     stents placed in mid LAD   CERVICAL SPINE SURGERY     FETAL BLOOD TRANSFUSION  2014   Frozen Shoulder     KIDNEY  STONE SURGERY     LEG SURGERY     broke her leg    LOWER EXTREMITY ANGIOGRAPHY Right 02/17/2019   Procedure: LOWER EXTREMITY ANGIOGRAPHY;  Surgeon: Renford Dills, MD;  Location: [facility]IVE [facility];  Service: Cardiovascular;  Laterality: Right;   LOWER EXTREMITY ANGIOGRAPHY Left 03/18/2019   Procedure: LOWER EXTREMITY ANGIOGRAPHY;  Surgeon: Renford Dills, MD;  Location: [facility];  Service: Cardiovascular;  Laterality: Left;    Home Medications:  Allergies as of 12/22/2020       Reactions   Sulfa Antibiotics Anaphylaxis        Medication List        Accurate as of December 22, 2020 11:59 PM. If you have any questions, ask your nurse or doctor.          atorvastatin 20 MG tablet Commonly known as: LIPITOR TAKE 1 TABLET(20 MG) BY MOUTH DAILY   clopidogrel 75 MG tablet Commonly known as: Plavix Take 1 tablet (75 mg total) by mouth daily.   estradiol 0.1 MG/GM vaginal cream Commonly known as: ESTRACE Apply a pea size amount Monday, Wednesday, and Friday night   ezetimibe 10 MG tablet Commonly known as: ZETIA Take 1 tablet (10 mg total) by mouth daily.   losartan-hydrochlorothiazide 100-25 MG tablet Commonly known as: HYZAAR Take 1 tablet by mouth daily.   meloxicam 7.5 MG tablet Commonly known as: Mobic Take 1 tablet (7.5 mg total) by mouth daily.   nitroGLYCERIN 0.4 MG SL tablet Commonly known as: NITROSTAT Place 1 tablet (0.4 mg total) under the tongue every 5 (five) minutes as needed for chest pain.   temazepam 30 MG capsule Commonly known as: RESTORIL Take 30 mg by mouth at bedtime.        Allergies:  Allergies  Allergen Reactions   Sulfa Antibiotics Anaphylaxis    Family History: Family History  Problem Relation Age of Onset   Hypertension Mother    Heart attack Mother    Heart attack Father    Heart disease Brother        h/o CABG   Diabetes Brother    Kidney disease Brother    Kidney cancer Neg Hx    Bladder  Cancer Neg Hx    Breast cancer Neg Hx     Social History:  reports that she has never smoked. She has never used smokeless tobacco. She reports that she does not drink alcohol and does not use drugs.   Physical Exam: BP (!) 142/84   Pulse 82   Ht 5\' 8"  (1.727 m)   Wt 174 lb (78.9 kg)   BMI 26.46 kg/m   Constitutional:  Alert and oriented, No acute distress. HEENT: [place] AT, mask in place.  Trachea midline Cardiovascular: No clubbing, cyanosis, or edema. Respiratory: Normal respiratory effort, no increased work of breathing. Neurologic: Grossly intact, no  focal deficits, moving all 4 extremities. Psychiatric: Normal mood and affect.  Laboratory Data:  Ref Range & Units 1 mo ago  Folate (Folic Acid) >4.0 ng/mL >22.3   Resulting Agency  [facility] - LAB  Narrative Performed by [facility] - LAB Method: [place], [place] Standard 03/178 Specimen Collected: 11/16/20 16:53 Last Resulted: 11/16/20 18:25  Received From: [facility]  Result Received: 11/17/20 15:48   Component     Latest Ref Rng & Units 12/20/2020          WBC     4.0 - 10.5 K/uL 9.3  RBC     3.87 - 5.11 MIL/uL 3.91  Hemoglobin     12.0 - 15.0 g/dL 84.1 (L)  HCT     66.0 - 46.0 % 34.1 (L)  MCV     80.0 - 100.0 fL 87.2  MCH     26.0 - 34.0 pg 26.6  MCHC     30.0 - 36.0 g/dL 63.0  RDW     16.0 - 10.9 % 14.3  Platelets     150 - 400 K/uL 279  nRBC     0.0 - 0.2 % 0.0  Neutrophils     % 70  NEUT#     1.7 - 7.7 K/uL 6.5  Lymphocytes     % 21  Lymphocyte #     0.7 - 4.0 K/uL 1.9  Monocytes Relative     % 7  Monocyte #     0.1 - 1.0 K/uL 0.6  Eosinophil     % 2  Eosinophils Absolute     0.0 - 0.5 K/uL 0.2  Basophil     % 0  Basophils Absolute     0.0 - 0.1 K/uL 0.0  Immature Granulocytes     % 0  Abs Immature Granulocytes     0.00 - 0.07 K/uL 0.04  WBC, UA     0 - 5 /hpf   Epithelial Cells (non renal)     0 - 10 /hpf   Casts     None seen /lpf    Cast Type     N/A   Crystals     N/A   Crystal Type     N/A   Bacteria, UA     None seen/Few   Renal Epithel, UA     None seen /hpf    Component     Latest Ref Rng & Units 12/22/2020  Specific Gravity, UA     1.005 - 1.030 1.020  pH, UA     5.0 - 7.5 6.0  Color, UA     Yellow Yellow  Appearance Ur     Clear Cloudy (A)  Leukocytes,UA     Negative 2+ (A)  Protein,UA     Negative/Trace 1+ (A)  Glucose, UA     Negative Negative  Ketones, UA     Negative Negative  RBC, UA     Negative Trace (A)  Bilirubin, UA     Negative Negative  Urobilinogen, Ur     0.2 - 1.0 mg/dL 0.2  Nitrite, UA     Negative Negative  Microscopic Examination      See below:   Component     Latest Ref Rng & Units 12/22/2020          WBC, UA     0 - 5 /hpf >30 (A)  RBC     0 - 2 /hpf 0-2  Epithelial Cells (non renal)     0 - 10 /hpf 0-10  Bacteria, UA     None seen/Few Many (A)  I have reviewed the labs.   Pertinent imaging N/A    Assessment & Plan:    Recurrent UTI's  - Urinalysis positive today for >30 WBCs and many bacteria.  - Urine sent for a culture  - Offered  Macrobid; she would like to wait until her results are back to consider antibiotics.  - Counseled her in UTI prevention supplements such as cranberry tablets, probiotics, yogurt that has active lactobacillus culture, and d-mannose   Vaginal atrophy  - Continue vaginal estrogen cream  - Difficulty in consistency due to memory loss   Nephrolithiasis  - Right lower pole stones  - Asymptomatic  - no intervention warranted at this time   4. Renal cysts -bilateral Bosniak 1 & 2 cysts on 09/2020 CT urogram -RUS in 6 months   Return in about 6 months (around 06/21/2021) for RUS .  [facility] [address], [address] [place], [place] [address] [phone]  [facility] as a scribe for [facility], PA-C.,have documented all relevant documentation on the behalf of  Pia Jedlicka, PA-C,as directed by  [facility], PA-C while in the presence of Romie Keeble, PA-C.  I have reviewed the above documentation for accuracy and completeness, and I agree with the above.    Michiel Cowboy, PA-C

## 2020-12-22 ENCOUNTER — Other Ambulatory Visit: Payer: Self-pay

## 2020-12-22 ENCOUNTER — Ambulatory Visit (INDEPENDENT_AMBULATORY_CARE_PROVIDER_SITE_OTHER): Payer: Medicare Other | Admitting: Urology

## 2020-12-22 ENCOUNTER — Encounter: Payer: Self-pay | Admitting: Urology

## 2020-12-22 VITALS — BP 142/84 | HR 82 | Ht 68.0 in | Wt 174.0 lb

## 2020-12-22 DIAGNOSIS — N390 Urinary tract infection, site not specified: Secondary | ICD-10-CM | POA: Diagnosis not present

## 2020-12-22 DIAGNOSIS — Q6102 Congenital multiple renal cysts: Secondary | ICD-10-CM | POA: Diagnosis not present

## 2020-12-22 DIAGNOSIS — N952 Postmenopausal atrophic vaginitis: Secondary | ICD-10-CM

## 2020-12-22 DIAGNOSIS — N200 Calculus of kidney: Secondary | ICD-10-CM

## 2020-12-23 LAB — URINALYSIS, COMPLETE
Bilirubin, UA: NEGATIVE
Glucose, UA: NEGATIVE
Ketones, UA: NEGATIVE
Nitrite, UA: NEGATIVE
Specific Gravity, UA: 1.02 (ref 1.005–1.030)
Urobilinogen, Ur: 0.2 mg/dL (ref 0.2–1.0)
pH, UA: 6 (ref 5.0–7.5)

## 2020-12-23 LAB — MICROSCOPIC EXAMINATION: WBC, UA: >30 /hpf — AB (ref 0–5)

## 2020-12-28 ENCOUNTER — Other Ambulatory Visit: Payer: Self-pay | Admitting: *Deleted

## 2020-12-28 LAB — CULTURE, URINE COMPREHENSIVE

## 2020-12-28 MED ORDER — NITROFURANTOIN MONOHYD MACRO 100 MG PO CAPS: 100.0000 mg | ORAL_CAPSULE | Freq: Two times a day (BID) | ORAL | 0 refills | Status: AC

## 2020-12-29 ENCOUNTER — Other Ambulatory Visit: Payer: Self-pay | Admitting: *Deleted

## 2020-12-29 DIAGNOSIS — N952 Postmenopausal atrophic vaginitis: Secondary | ICD-10-CM

## 2020-12-29 MED ORDER — ESTRADIOL 0.1 MG/GM VA CREA: TOPICAL_CREAM | VAGINAL | 12 refills | Status: DC

## 2020-12-30 ENCOUNTER — Other Ambulatory Visit: Payer: Self-pay | Admitting: Urology

## 2020-12-30 DIAGNOSIS — N952 Postmenopausal atrophic vaginitis: Secondary | ICD-10-CM

## 2021-01-10 ENCOUNTER — Other Ambulatory Visit: Payer: Self-pay

## 2021-01-10 ENCOUNTER — Inpatient Hospital Stay: Payer: Medicare Other | Attending: Oncology

## 2021-01-10 ENCOUNTER — Inpatient Hospital Stay: Payer: Medicare Other

## 2021-01-10 DIAGNOSIS — D631 Anemia in chronic kidney disease: Secondary | ICD-10-CM

## 2021-01-10 DIAGNOSIS — N183 Chronic kidney disease, stage 3 unspecified: Secondary | ICD-10-CM | POA: Insufficient documentation

## 2021-01-10 LAB — HEMOGLOBIN AND HEMATOCRIT, BLOOD
HCT: 34.9 % — ABNORMAL LOW (ref 36.0–46.0)
Hemoglobin: 10.6 g/dL — ABNORMAL LOW (ref 12.0–15.0)

## 2021-01-24 ENCOUNTER — Telehealth: Payer: Self-pay | Admitting: Oncology

## 2021-01-24 NOTE — Telephone Encounter (Signed)
Pt called in to reschedule her appt. On 11-8. Please call he at [phone]

## 2021-01-31 ENCOUNTER — Inpatient Hospital Stay: Payer: Medicare Other

## 2021-02-01 ENCOUNTER — Inpatient Hospital Stay: Payer: Medicare Other

## 2021-02-01 ENCOUNTER — Inpatient Hospital Stay: Payer: Medicare Other | Attending: Oncology

## 2021-02-01 ENCOUNTER — Other Ambulatory Visit: Payer: Self-pay

## 2021-02-01 VITALS — BP 160/73 | HR 67

## 2021-02-01 DIAGNOSIS — D631 Anemia in chronic kidney disease: Secondary | ICD-10-CM

## 2021-02-01 DIAGNOSIS — N189 Chronic kidney disease, unspecified: Secondary | ICD-10-CM

## 2021-02-01 DIAGNOSIS — N183 Chronic kidney disease, stage 3 unspecified: Secondary | ICD-10-CM | POA: Diagnosis present

## 2021-02-01 LAB — CBC WITH DIFFERENTIAL/PLATELET
Abs Immature Granulocytes: 0.02 10*3/uL (ref 0.00–0.07)
Basophils Absolute: 0 10*3/uL (ref 0.0–0.1)
Basophils Relative: 0 %
Eosinophils Absolute: 0.3 10*3/uL (ref 0.0–0.5)
Eosinophils Relative: 3 %
HCT: 33.7 % — ABNORMAL LOW (ref 36.0–46.0)
Hemoglobin: 10.6 g/dL — ABNORMAL LOW (ref 12.0–15.0)
Immature Granulocytes: 0 %
Lymphocytes Relative: 18 %
Lymphs Abs: 1.6 10*3/uL (ref 0.7–4.0)
MCH: 27.2 pg (ref 26.0–34.0)
MCHC: 31.5 g/dL (ref 30.0–36.0)
MCV: 86.6 fL (ref 80.0–100.0)
Monocytes Absolute: 0.6 10*3/uL (ref 0.1–1.0)
Monocytes Relative: 7 %
Neutro Abs: 6.2 10*3/uL (ref 1.7–7.7)
Neutrophils Relative %: 72 %
Platelets: 222 10*3/uL (ref 150–400)
RBC: 3.89 MIL/uL (ref 3.87–5.11)
RDW: 14.3 % (ref 11.5–15.5)
WBC: 8.7 10*3/uL (ref 4.0–10.5)
nRBC: 0 % (ref 0.0–0.2)

## 2021-02-01 LAB — IRON AND TIBC
Iron: 69 ug/dL (ref 28–170)
Saturation Ratios: 25 % (ref 10.4–31.8)
TIBC: 277 ug/dL (ref 250–450)
UIBC: 208 ug/dL

## 2021-02-01 LAB — FERRITIN: Ferritin: 340 ng/mL — ABNORMAL HIGH (ref 11–307)

## 2021-02-01 MED ORDER — EPOETIN ALFA-EPBX 40000 UNIT/ML IJ SOLN
40000.0000 [IU] | Freq: Once | INTRAMUSCULAR | Status: AC
  Administered 2021-02-01: 40000 [IU] via SUBCUTANEOUS
  Filled 2021-02-01: qty 1

## 2021-02-20 ENCOUNTER — Other Ambulatory Visit: Payer: Self-pay | Admitting: *Deleted

## 2021-02-20 DIAGNOSIS — D631 Anemia in chronic kidney disease: Secondary | ICD-10-CM

## 2021-02-21 ENCOUNTER — Other Ambulatory Visit: Payer: Self-pay

## 2021-02-21 ENCOUNTER — Inpatient Hospital Stay: Payer: Medicare Other

## 2021-02-21 ENCOUNTER — Encounter: Payer: Self-pay | Admitting: Physician Assistant

## 2021-02-21 ENCOUNTER — Ambulatory Visit: Payer: Medicare Other | Admitting: Physician Assistant

## 2021-02-21 VITALS — BP 174/72 | HR 68 | Ht 67.0 in | Wt 182.0 lb

## 2021-02-21 DIAGNOSIS — M5417 Radiculopathy, lumbosacral region: Secondary | ICD-10-CM | POA: Diagnosis not present

## 2021-02-21 DIAGNOSIS — R413 Other amnesia: Secondary | ICD-10-CM | POA: Diagnosis not present

## 2021-02-21 DIAGNOSIS — R3915 Urgency of urination: Secondary | ICD-10-CM | POA: Diagnosis not present

## 2021-02-21 DIAGNOSIS — R8271 Bacteriuria: Secondary | ICD-10-CM | POA: Insufficient documentation

## 2021-02-21 LAB — MICROSCOPIC EXAMINATION: WBC, UA: >30 /hpf — AB (ref 0–5)

## 2021-02-21 LAB — URINALYSIS, COMPLETE
Bilirubin, UA: NEGATIVE
Glucose, UA: NEGATIVE
Ketones, UA: NEGATIVE
Nitrite, UA: NEGATIVE
Specific Gravity, UA: 1.02 (ref 1.005–1.030)
Urobilinogen, Ur: 0.2 mg/dL (ref 0.2–1.0)
pH, UA: 6 (ref 5.0–7.5)

## 2021-02-21 MED ORDER — MIRABEGRON ER 25 MG PO TB24: 25.0000 mg | ORAL_TABLET | Freq: Every day | ORAL | 0 refills | Status: DC

## 2021-02-21 NOTE — Progress Notes (Signed)
02/21/2021 12:03 PM   [name] Cooper 1940/11/11 [id number]  CC: Chief Complaint  Patient presents with   Dysuria   HPI: [name] Cooper is a 80 y.o. female with PMH recurrent UTI, vaginal atrophy on vaginal estrogen cream, nephrolithiasis, bilateral renal cysts, and mild dementia who presents today for evaluation of possible UTI.   Today she reports an approximate 1 week history of left low back pain that she attributes to UTI.  She denies dysuria.  She states she has urinary frequency every 3 hours during the day and night and some discomfort associated with severe urinary urgency as well as urge incontinence.  She thinks that her urinary urgency is indicative of UTI.  She states that her urinary urgency and frequency have been stable to slowly worsening and do not resolve with antibiotics.  On further questioning, she states that her back pain is associated with sensation of burning and tingling in her legs.  She is certain that this is due to UTI.  On chart review, she has a history of lumbar degenerative disc disease and lumbosacral radiculopathy.  Per chart review, she saw her PCP 1 week ago with reports of acute left-sided low back pain.  They checked a UA on her given her reports of worsening urgency and frequency and concern that UTI could be contributing to her discomfort.  Her urine culture grew E. coli and Klebsiella pneumoniae and she was treated with Macrobid 100 mg twice daily x5 days.  Per chart review, she was on Myrbetriq 25 mg daily in 2021, but it appears this was discontinued when she stopped taking it for unknown reasons.  She does not recall having ever taken medication for overactive bladder.  In-office UA today positive for trace intact blood, trace protein, and 3+ leukocyte esterase; urine microscopy with >30 WBCs/HPF and many bacteria.   PMH: Past Medical History:  Diagnosis Date   Anemia    Arthritis    left hip, left ankle   Cholelithiases    CKD  (chronic kidney disease), stage II    Coronary artery disease    a. cardiac cath 01/2011: mid LAD stent patent, mild lumenal irreg's, nl EF 55%, no AS/MR   Fibromyalgia    Frozen shoulder    History of kidney stones    HTN (hypertension)    controlled with medication;    Hyperlipidemia    a. statin intolerant   Iron deficiency    Thrombocytopenia (HCC)    Ulcer of left lower extremity, limited to breakdown of skin (HCC)    UTI (urinary tract infection)    Venous stasis     Surgical History: Past Surgical History:  Procedure Laterality Date   CARDIAC CATHETERIZATION     stents placed in mid LAD   CERVICAL SPINE SURGERY     FETAL BLOOD TRANSFUSION  2014   Frozen Shoulder     KIDNEY STONE SURGERY     LEG SURGERY     broke her leg    LOWER EXTREMITY ANGIOGRAPHY Right 02/17/2019   Procedure: LOWER EXTREMITY ANGIOGRAPHY;  Surgeon: Renford Dills, MD;  Location: [facility];  Service: Cardiovascular;  Laterality: Right;   LOWER EXTREMITY ANGIOGRAPHY Left 03/18/2019   Procedure: LOWER EXTREMITY ANGIOGRAPHY;  Surgeon: Renford Dills, MD;  Location: [facility];  Service: Cardiovascular;  Laterality: Left;    Home Medications:  Allergies as of 02/21/2021       Reactions   Sulfa Antibiotics Anaphylaxis  Medication List        Accurate as of February 21, 2021 12:03 PM. If you have any questions, ask your nurse or doctor.          atorvastatin 20 MG tablet Commonly known as: LIPITOR TAKE 1 TABLET(20 MG) BY MOUTH DAILY   clopidogrel 75 MG tablet Commonly known as: Plavix Take 1 tablet (75 mg total) by mouth daily.   estradiol 0.1 MG/GM vaginal cream Commonly known as: ESTRACE APPLY A PEA SIZE AMOUNT MONDAY, WEDNESDAY, AND FRIDAY NIGHT   ezetimibe 10 MG tablet Commonly known as: ZETIA Take 1 tablet (10 mg total) by mouth daily.   losartan-hydrochlorothiazide 100-25 MG tablet Commonly known as: HYZAAR Take 1 tablet by mouth  daily.   meloxicam 7.5 MG tablet Commonly known as: Mobic Take 1 tablet (7.5 mg total) by mouth daily.   mirabegron ER 25 MG Tb24 tablet Commonly known as: MYRBETRIQ Take 1 tablet (25 mg total) by mouth daily. Started by: Debroah Loop, PA-C   nitroGLYCERIN 0.4 MG SL tablet Commonly known as: NITROSTAT Place 1 tablet (0.4 mg total) under the tongue every 5 (five) minutes as needed for chest pain.   temazepam 30 MG capsule Commonly known as: RESTORIL Take 30 mg by mouth at bedtime.        Allergies:  Allergies  Allergen Reactions   Sulfa Antibiotics Anaphylaxis    Family History: Family History  Problem Relation Age of Onset   Hypertension Mother    Heart attack Mother    Heart attack Father    Heart disease Brother        h/o CABG   Diabetes Brother    Kidney disease Brother    Kidney cancer Neg Hx    Bladder Cancer Neg Hx    Breast cancer Neg Hx     Social History:   reports that she has never smoked. She has never used smokeless tobacco. She reports that she does not drink alcohol and does not use drugs.  Physical Exam: BP (!) 174/72   Pulse 68   Ht 5\' 7"  (1.702 m)   Wt 182 lb (82.6 kg)   BMI 28.51 kg/m   Constitutional:  Alertd, no acute distress, nontoxic appearing HEENT: [place], AT Cardiovascular: No clubbing, cyanosis, or edema Respiratory: Normal respiratory effort, no increased work of breathing Skin: No rashes, bruises or suspicious lesions Neurologic: Grossly intact, no focal deficits, moving all 4 extremities Psychiatric: Normal mood and affect  Laboratory Data: Results for orders placed or performed in visit on 02/21/21  Microscopic Examination   Urine  Result Value Ref Range   WBC, UA >30 (A) 0 - 5 /hpf   RBC 0-2 0 - 2 /hpf   Epithelial Cells (non renal) 0-10 0 - 10 /hpf   Crystals Present (A) N/A   Crystal Type Amorphous Sediment N/A   Bacteria, UA Many (A) None seen/Few  Urinalysis, Complete  Result Value Ref Range    Specific Gravity, UA 1.020 1.005 - 1.030   pH, UA 6.0 5.0 - 7.5   Color, UA Yellow Yellow   Appearance Ur Cloudy (A) Clear   Leukocytes,UA 3+ (A) Negative   Protein,UA Trace (A) Negative/Trace   Glucose, UA Negative Negative   Ketones, UA Negative Negative   RBC, UA Trace (A) Negative   Bilirubin, UA Negative Negative   Urobilinogen, Ur 0.2 0.2 - 1.0 mg/dL   Nitrite, UA Negative Negative   Microscopic Examination See below:    Assessment & Plan:  80 year old female with a history of recurrent UTI, atrophic vaginitis, and mild dementia presents with concerns for acute cystitis, however on questioning her primary symptoms include chronic urgency and frequency as well as acute left low back pain with radiation to the BLEs. 1. Urinary urgency I explained the patient that while severe urinary urgency may be uncomfortable, it does not represent true dysuria and I do not believe that her symptoms today are consistent with acute cystitis.  I think far more likely she is chronically colonized with bacteria with superimposed OAB wet.  I explained that I would like to treat the root of her symptoms by getting her back on OAB medication.  We will send her urine for culture today, though it is rather stable compared to prior.  I do not recommend treating with more antibiotics in the absence of dysuria given increasing resistance pattern on culture and my low suspicion for clinical infection. - Urinalysis, Complete - CULTURE, URINE COMPREHENSIVE - mirabegron ER (MYRBETRIQ) 25 MG TB24 tablet; Take 1 tablet (25 mg total) by mouth daily.  Dispense: 28 tablet; Refill: 0  2. Asymptomatic bacteriuria We discussed the difference between acute cystitis and asymptomatic bacteriuria.  I explained that in the absence of acute infective symptoms, there is no need to treat bacteria in the urine unless she is undergoing urologic procedures.  She seemed hesitant about this.  3. L-S radiculopathy We discussed that low  back pain radiating to the lower extremities is classically a musculoskeletal problem and is not associated with acute cystitis.  I encouraged her to follow-up again with her PCP for further assistance with this symptom.  4. Memory loss I am concerned that her dementia may be playing a role here and she may have poor insight into the chronicity of her symptoms.  She has historically had difficulty with medication adherence due to this, however I feel overall that she would benefit more from appropriate OAB medications than continued antibiotics when there is little evidence of acute infection today.  Return in about 4 weeks (around 03/21/2021) for Symptom recheck with PVR.  Debroah Loop, PA-C  [facility] [address] [place], [place] [address] [phone]

## 2021-02-21 NOTE — Patient Instructions (Addendum)
We discussed today that you are having back pain that is moving into your legs as well as frequent urination and discomfort due to the urge to urinate. You shared with me that this frequent and urgent urination does not really get better when you take antibiotics for UTI. I am concerned that you have an overactive bladder, which can cause frequent and urgent urination. Sometimes that urgency can cause uncomfortable sensations in your bladder. This would explain why your symptoms don't get significantly better on antibiotics: your problem may not be caused by infection.  I'm starting you on an overactive bladder medication today called Myrbetriq. I have given you 4 weeks of samples. Please take this once daily and I will see you back in clinic next month to check on your symptoms on this medication.  I will send your urine for a culture today and we will call you when we get your results, but I recommend that we do not start antibiotics today as I do not think this is the source of your symptoms.  Lastly, I think that your back pain that is moving into your legs is actually caused by back problems and not from a UTI. UTIs do not cause pain in the legs. I would like you to follow up with Dr. Burnett Sheng to discuss this more.

## 2021-02-22 ENCOUNTER — Inpatient Hospital Stay: Payer: Medicare Other

## 2021-02-23 ENCOUNTER — Telehealth: Payer: Self-pay | Admitting: Physician Assistant

## 2021-02-23 NOTE — Telephone Encounter (Signed)
Pt was seen this past Tuesday.  She said the samples are helping at all.  Her back is hurting so bad she can barely walk. She is still having UTI symptoms.  Pt would like for Shannon to follow up with her.

## 2021-02-23 NOTE — Telephone Encounter (Signed)
Advised pt she needs to allow at least 2 weeks to achieve therapeutic response from medication. Patient states she is NOT experiencing burning painful urination, she is experiencing back pain.  Advised patient she should see PCP for back pain. Patient agrees to wait 2 weeks for medication response and has an appt with Carollee Herter on 12/21.

## 2021-02-28 ENCOUNTER — Other Ambulatory Visit: Payer: Self-pay

## 2021-02-28 ENCOUNTER — Encounter: Payer: Self-pay | Admitting: Urology

## 2021-02-28 ENCOUNTER — Ambulatory Visit: Payer: Medicare Other | Admitting: Urology

## 2021-02-28 VITALS — BP 174/82 | HR 92 | Ht 67.0 in | Wt 186.0 lb

## 2021-02-28 DIAGNOSIS — R8271 Bacteriuria: Secondary | ICD-10-CM | POA: Diagnosis not present

## 2021-02-28 DIAGNOSIS — N390 Urinary tract infection, site not specified: Secondary | ICD-10-CM

## 2021-02-28 DIAGNOSIS — R413 Other amnesia: Secondary | ICD-10-CM

## 2021-02-28 DIAGNOSIS — N3281 Overactive bladder: Secondary | ICD-10-CM

## 2021-02-28 DIAGNOSIS — M5417 Radiculopathy, lumbosacral region: Secondary | ICD-10-CM | POA: Diagnosis not present

## 2021-02-28 LAB — CULTURE, URINE COMPREHENSIVE

## 2021-02-28 NOTE — Patient Instructions (Signed)
[facility]   [address]   [place], [place] [address]   [phone]   Sandie Ano, MD   [address] [place], [place] [address]   [phone] (Work)   [phone])     [place], [place], [place]   [address] [place], [place] [address]   [phone] (Work)   [phone] (Fax)     1208/2022 at 1:30 pm

## 2021-03-09 NOTE — Progress Notes (Signed)
03/09/21 10:33 PM   [name] Cooper 12/02/1940 [id number]  Referring provider:  Jerl MinaHedrick, James, MD [address] [facility] [place] [id number]  Chief Complaint  Patient presents with   Recurrent UTI   Urological history  1. rUTI's -contributing factors of age and vaginal atrophy -RUS 10/14/2019 bilateral renal cysts, but no hydronephrosis or stones -cysto 08/2020 NED -managed with vaginal estrogen cream, cranberry tablets -documented positive urine cultures over the last year              E. coli UTI resistant to ampicillin, ciprofloxacin, gentamicin, levofloxacin, tetracycline and trimethoprim/sulfa on December 23, 2019             Klebsiella pneumoniae resistant to ampicillin on June 17, 2020             Streptococcus anginosus group on Aug 01, 2020             E.coli resistant to ampicillin, ciprofloxacin, levofloxacin, tetracycline and trimethoprim/sulfa on 09/02/2020              E.coli resistant to ampicillin, ciprofloxacin, levofloxacin, tetracycline and trimethoprim/sulfa and Klebsiella pneumoniae resistant ampicillin and nitrofurantoin 09/22/2020  E.coli resistant to ampicillin, ciprofloxacin, gentamicin, levofloxacin, tobramycin and trimethoprim/sulfa on October 21, 2020  E.coli resistant to ampicillin, ciprofloxacin, levofloxacin, tetracycline and trimethoprim/sulfa on November 15, 2020  E.coli resistant to ampicillin, ciprofloxacin, levofloxacin, tetracycline and trimethoprim/sulfa on December 22, 2020  Klebsiella pneumoniae resistant to ampicillin and nitrofurantoin on February 21, 2021         2. Vaginal atrophy -managed with vaginal estrogen cream three nights weekly   3. Nephrolithiasis -KUB 08/2017 right renal stones   4. Bilateral renal cysts -CTU 09/2020 - There are multiple simple and complex cystic renal lesions bilaterally. A lesion in the anterior interpolar region of the right kidney demonstrates thin partially calcified septations and  measures 2.9 x 2.4 cm on image 25/4. This demonstrates no solid components or significant change from the previous study. A similarly complex lesion in the upper pole of the left kidney demonstrates thin partially calcified septations and measures 4.5 x 4.0 cm on image 18/4, also similar to the previous study. Some of the simple cysts have mildly enlarged.   HPI: [name] Cooper is a 80 y.o.female who presents today for further discussion regarding her symptoms with her daughter, [name] Cooper.  At her last visit on November 20's ninth 2022 with Sam, her symptoms at that time were more representative of overactive bladder and lumbosacral radiculopathy.  She was started on Myrbetriq 25 mg daily and encouraged to reach out to her PCP regarding her back pain.  Unfortunately, she is also having continual issues with memory loss making it difficult for her to make reasonable medical decisions and perseverates on the fact that her symptoms are due to recurrent UTIs versus other etiologies.  She presents today with the same complaints but with her daughter.  Her daughter, [name] Moneyony, would like to review of the last visit as she was not with her mother at that time.  She states that she has been having lower back pain that radiates down the outside of her legs bilaterally.  She also experiences frequency, urgency and burning in the vaginal area.  Patient denies any modifying or aggravating factors.  Patient denies any gross hematuria, dysuria or suprapubic/flank pain.  Patient denies any fevers, chills, nausea or vomiting.    PMH: Past Medical History:  Diagnosis Date   Anemia    Arthritis  left hip, left ankle   Cholelithiases    CKD (chronic kidney disease), stage II    Coronary artery disease    a. cardiac cath 01/2011: mid LAD stent patent, mild lumenal irreg's, nl EF 55%, no AS/MR   Fibromyalgia    Frozen shoulder    History of kidney stones    HTN (hypertension)    controlled with medication;     Hyperlipidemia    a. statin intolerant   Iron deficiency    Thrombocytopenia (HCC)    Ulcer of left lower extremity, limited to breakdown of skin (HCC)    UTI (urinary tract infection)    Venous stasis     Surgical History: Past Surgical History:  Procedure Laterality Date   CARDIAC CATHETERIZATION     stents placed in mid LAD   CERVICAL SPINE SURGERY     FETAL BLOOD TRANSFUSION  2014   Frozen Shoulder     KIDNEY STONE SURGERY     LEG SURGERY     broke her leg    LOWER EXTREMITY ANGIOGRAPHY Right 02/17/2019   Procedure: LOWER EXTREMITY ANGIOGRAPHY;  Surgeon: Renford Dills, MD;  Location: [facility];  Service: Cardiovascular;  Laterality: Right;   LOWER EXTREMITY ANGIOGRAPHY Left 03/18/2019   Procedure: LOWER EXTREMITY ANGIOGRAPHY;  Surgeon: Renford Dills, MD;  Location: [facility];  Service: Cardiovascular;  Laterality: Left;    Home Medications:  Allergies as of 02/28/2021       Reactions   Sulfa Antibiotics Anaphylaxis        Medication List        Accurate as of February 28, 2021 11:59 PM. If you have any questions, ask your nurse or doctor.          atorvastatin 20 MG tablet Commonly known as: LIPITOR TAKE 1 TABLET(20 MG) BY MOUTH DAILY   clopidogrel 75 MG tablet Commonly known as: Plavix Take 1 tablet (75 mg total) by mouth daily.   donepezil 5 MG tablet Commonly known as: ARICEPT Take 5 mg by mouth daily.   estradiol 0.1 MG/GM vaginal cream Commonly known as: ESTRACE APPLY A PEA SIZE AMOUNT MONDAY, WEDNESDAY, AND FRIDAY NIGHT   ezetimibe 10 MG tablet Commonly known as: ZETIA Take 1 tablet (10 mg total) by mouth daily.   losartan-hydrochlorothiazide 100-25 MG tablet Commonly known as: HYZAAR Take 1 tablet by mouth daily.   meloxicam 7.5 MG tablet Commonly known as: Mobic Take 1 tablet (7.5 mg total) by mouth daily.   mirabegron ER 25 MG Tb24 tablet Commonly known as: MYRBETRIQ Take 1 tablet (25 mg total) by  mouth daily.   nitroGLYCERIN 0.4 MG SL tablet Commonly known as: NITROSTAT Place 1 tablet (0.4 mg total) under the tongue every 5 (five) minutes as needed for chest pain.   temazepam 30 MG capsule Commonly known as: RESTORIL Take 30 mg by mouth at bedtime.        Allergies:  Allergies  Allergen Reactions   Sulfa Antibiotics Anaphylaxis    Family History: Family History  Problem Relation Age of Onset   Hypertension Mother    Heart attack Mother    Heart attack Father    Heart disease Brother        h/o CABG   Diabetes Brother    Kidney disease Brother    Kidney cancer Neg Hx    Bladder Cancer Neg Hx    Breast cancer Neg Hx     Social History:  reports that  she has never smoked. She has never used smokeless tobacco. She reports that she does not drink alcohol and does not use drugs.   Physical Exam: BP (!) 174/82    Pulse 92    Ht 5\' 7"  (1.702 m)    Wt 186 lb (84.4 kg)    BMI 29.13 kg/m   Constitutional:  Well nourished. Alert and oriented, No acute distress. HEENT: [place] AT, mask in place.  Trachea midline Cardiovascular: No clubbing, cyanosis, or edema. Respiratory: Normal respiratory effort, no increased work of breathing. Neurologic: Grossly intact, no focal deficits, moving all 4 extremities. Psychiatric: Normal mood and affect.    Laboratory Data: N/A   Pertinent imaging N/A    Assessment & Plan:    1. Urgency -Explained to the patient and her daughter that she was given Myrbetriq 25 mg daily to her address her overactive bladder symptoms and that she had not been on this medication long enough to see if it is effective in controlling her urgency and to give it more time  2.  Asymptomatic bacteriuria -Reviewed the difference between acute cystitis and asymptomatic bacteriuria with the patient and her daughter  3. L-S radiculopathy -Explained to the patient and her daughter that the pain that she is experiencing is likely a muscle skeletal problem as it  radiates down the outside of the lower extremities whereas urological issues with back pain typically radiate to the inside of the thighs  4.  Memory loss -Explained that due to her increasing dementia, it is difficult for her to remember previous conversations and therefore has limited insight into her medical issues.  She also seems to perseverate on her symptoms as due to her memory issues she does not get an adequate explanation as to why she experiences the symptoms that she is.  Her daughter expresses her understanding of this and will follow up with PCP regarding her back pain continue appointments with neurology  Return for keep follow up .  [facility] [address] [place], [place] [address] [phone]  Zara Council, PA-C   I spent 25 minutes on the day of the encounter to include pre-visit record review, face-to-face time with the patient, and post-visit ordering of tests.

## 2021-03-14 ENCOUNTER — Inpatient Hospital Stay: Payer: Medicare Other

## 2021-03-14 ENCOUNTER — Inpatient Hospital Stay: Payer: Medicare Other | Admitting: Oncology

## 2021-03-14 NOTE — Progress Notes (Signed)
03/15/21 10:02 AM   [name] Cooper Mar 20, 1941 [id number]  Referring provider:  [name], MD [address] [facility] [place],  [place] [address]  Chief Complaint  Patient presents with   Over Active Bladder   Urological history  1. rUTI's -contributing factors of age and vaginal atrophy -RUS 10/14/2019 bilateral renal cysts, but no hydronephrosis or stones -cysto 08/2020 NED -managed with vaginal estrogen cream, cranberry tablets -documented positive urine cultures over the last year              E. coli UTI resistant to ampicillin, ciprofloxacin, gentamicin, levofloxacin, tetracycline and trimethoprim/sulfa on December 23, 2019             Klebsiella pneumoniae resistant to ampicillin on June 17, 2020             Streptococcus anginosus group on Aug 01, 2020             E.coli resistant to ampicillin, ciprofloxacin, levofloxacin, tetracycline and trimethoprim/sulfa on 09/02/2020              E.coli resistant to ampicillin, ciprofloxacin, levofloxacin, tetracycline and trimethoprim/sulfa and Klebsiella pneumoniae resistant ampicillin and nitrofurantoin 09/22/2020  E.coli resistant to ampicillin, ciprofloxacin, gentamicin, levofloxacin, tobramycin and trimethoprim/sulfa on October 21, 2020  E.coli resistant to ampicillin, ciprofloxacin, levofloxacin, tetracycline and trimethoprim/sulfa on November 15, 2020  E.coli resistant to ampicillin, ciprofloxacin, levofloxacin, tetracycline and trimethoprim/sulfa on December 22, 2020  Klebsiella pneumoniae resistant to ampicillin and nitrofurantoin on February 21, 2021         2. Vaginal atrophy -managed with vaginal estrogen cream three nights weekly   3. Nephrolithiasis -KUB 08/2017 right renal stones   4. Bilateral renal cysts -CTU 09/2020 - There are multiple simple and complex cystic renal lesions bilaterally. A lesion in the anterior interpolar region of the right kidney demonstrates thin partially calcified septations and  measures 2.9 x 2.4 cm on image 25/4. This demonstrates no solid components or significant change from the previous study. A similarly complex lesion in the upper pole of the left kidney demonstrates thin partially calcified septations and measures 4.5 x 4.0 cm on image 18/4, also similar to the previous study. Some of the simple cysts have mildly enlarged.   HPI: [name] Cooper is an 80 y.o.female who presents today for follow up.  At her visit on February 28, 2021, she presented with her daughter and we reviewed the happenings in the previous visit on February 21, 2021 with Sam.  She was advised to follow-up with her PCP regarding muscle skeletal issues and placed on Myrbetriq for her overactive bladder issues.  It was conveyed to her that her symptoms of lower back pain were not consistent with the UTI and the urinary frequency was more consistent with OAB.  We also explained that she is likely colonized versus UTI's resulting in bacterial growth on cultures.  Unfortunately, she has been seen twice since December 6 and diagnosed with a UTI with similar complaints of lower back pain and urinary frequency.  PVR 122 mL  She has been experiencing urinary urgency, painful urination and back pain.  She is having to take 1-7 trips during the day to urinate.  Nocturia x3.  She has had a strong urge to urinate.  She loses urine when she cannot make it to the restroom on time.  She is leaking 3 or more times daily.  She is wearing absorbent pads daily.  And she does engage in toilet mapping.  When I  asked her to elaborate on her urinary symptoms, she becomes confused and denies previous complaints within the same office visit.    Patient denies any modifying or aggravating factors.  Patient denies any gross hematuria, dysuria or suprapubic/flank pain.  Patient denies any fevers, chills, nausea or vomiting.     PMH: Past Medical History:  Diagnosis Date   Anemia    Arthritis    left hip, left ankle    Cholelithiases    CKD (chronic kidney disease), stage II    Coronary artery disease    a. cardiac cath 01/2011: mid LAD stent patent, mild lumenal irreg's, nl EF 55%, no AS/MR   Fibromyalgia    Frozen shoulder    History of kidney stones    HTN (hypertension)    controlled with medication;    Hyperlipidemia    a. statin intolerant   Iron deficiency    Thrombocytopenia (HCC)    Ulcer of left lower extremity, limited to breakdown of skin (HCC)    UTI (urinary tract infection)    Venous stasis     Surgical History: Past Surgical History:  Procedure Laterality Date   CARDIAC CATHETERIZATION     stents placed in mid LAD   CERVICAL SPINE SURGERY     FETAL BLOOD TRANSFUSION  2014   Frozen Shoulder     KIDNEY STONE SURGERY     LEG SURGERY     broke her leg    LOWER EXTREMITY ANGIOGRAPHY Right 02/17/2019   Procedure: LOWER EXTREMITY ANGIOGRAPHY;  Surgeon: Katha Cabal, MD;  Location: [facility];  Service: Cardiovascular;  Laterality: Right;   LOWER EXTREMITY ANGIOGRAPHY Left 03/18/2019   Procedure: LOWER EXTREMITY ANGIOGRAPHY;  Surgeon: Katha Cabal, MD;  Location: [facility];  Service: Cardiovascular;  Laterality: Left;    Home Medications:  Allergies as of 03/15/2021       Reactions   Sulfa Antibiotics Anaphylaxis        Medication List        Accurate as of March 15, 2021 10:02 AM. If you have any questions, ask your nurse or doctor.          STOP taking these medications    mirabegron ER 25 MG Tb24 tablet Commonly known as: MYRBETRIQ Stopped by: Zara Council, PA-C       TAKE these medications    atorvastatin 20 MG tablet Commonly known as: LIPITOR TAKE 1 TABLET(20 MG) BY MOUTH DAILY   clopidogrel 75 MG tablet Commonly known as: Plavix Take 1 tablet (75 mg total) by mouth daily.   donepezil 5 MG tablet Commonly known as: ARICEPT Take 5 mg by mouth daily.   estradiol 0.1 MG/GM vaginal cream Commonly known as:  ESTRACE APPLY A PEA SIZE AMOUNT MONDAY, WEDNESDAY, AND FRIDAY NIGHT   ezetimibe 10 MG tablet Commonly known as: ZETIA Take 1 tablet (10 mg total) by mouth daily.   Gemtesa 75 MG Tabs Generic drug: Vibegron Take 75 mg by mouth daily. Started by: Zara Council, PA-C   losartan-hydrochlorothiazide 100-25 MG tablet Commonly known as: HYZAAR Take 1 tablet by mouth daily.   meloxicam 7.5 MG tablet Commonly known as: Mobic Take 1 tablet (7.5 mg total) by mouth daily.   nitroGLYCERIN 0.4 MG SL tablet Commonly known as: NITROSTAT Place 1 tablet (0.4 mg total) under the tongue every 5 (five) minutes as needed for chest pain.   temazepam 30 MG capsule Commonly known as: RESTORIL Take 30 mg by mouth at  bedtime.        Allergies:  Allergies  Allergen Reactions   Sulfa Antibiotics Anaphylaxis    Family History: Family History  Problem Relation Age of Onset   Hypertension Mother    Heart attack Mother    Heart attack Father    Heart disease Brother        h/o CABG   Diabetes Brother    Kidney disease Brother    Kidney cancer Neg Hx    Bladder Cancer Neg Hx    Breast cancer Neg Hx     Social History:  reports that she has never smoked. She has never used smokeless tobacco. She reports that she does not drink alcohol and does not use drugs.   Physical Exam: BP (!) 180/79    Pulse 71    Ht 5\' 5"  ([id number] m)    Wt 186 lb (84.4 kg)    BMI 30.95 kg/m   Constitutional:  Well nourished. Alert and oriented, No acute distress. HEENT: [place] AT, mask in plaece.  Trachea midline Cardiovascular: No clubbing, cyanosis, or edema. Respiratory: Normal respiratory effort, no increased work of breathing. Neurologic: Grossly intact, no focal deficits, moving all 4 extremities. Psychiatric: Normal mood and affect.    Laboratory Data: N/A   Pertinent imaging  Most Recent  Scan Result 181mL 03/15/21 09:30     Assessment & Plan:    1. Urgency -We will switch to Gemtesa 75 mg  daily, samples are given as she states the Myrbetriq was not effective  2.  Asymptomatic bacteriuria -Reviewed the difference between acute cystitis and asymptomatic bacteriuria with the patient  3. L-S radiculopathy -Patient continues to seek care with providers concerning her back pain and since her urine is colonized, she continues to receive antibiotics which is not appropriate -I have referred her back to [facility] for her back pain as she has seen them in the past for shoulder issues  4.  Memory loss -Continues to worsen making obtaining a history, explaining medical issues and compliance with medical treatments challenging -Daughter is aware of the situation   Return in about 1 month (around 04/15/2021) for PVR and OAB questionnaire.  [facility] [address], [place] [employer], [place] [address] [phone]  Zara Council, PA-C

## 2021-03-15 ENCOUNTER — Other Ambulatory Visit: Payer: Self-pay

## 2021-03-15 ENCOUNTER — Encounter: Payer: Self-pay | Admitting: Urology

## 2021-03-15 ENCOUNTER — Ambulatory Visit (INDEPENDENT_AMBULATORY_CARE_PROVIDER_SITE_OTHER): Payer: Medicare Other | Admitting: Urology

## 2021-03-15 VITALS — BP 180/79 | HR 71 | Ht 65.0 in | Wt 186.0 lb

## 2021-03-15 DIAGNOSIS — M5417 Radiculopathy, lumbosacral region: Secondary | ICD-10-CM

## 2021-03-15 DIAGNOSIS — R3915 Urgency of urination: Secondary | ICD-10-CM

## 2021-03-15 DIAGNOSIS — R413 Other amnesia: Secondary | ICD-10-CM | POA: Diagnosis not present

## 2021-03-15 DIAGNOSIS — R8271 Bacteriuria: Secondary | ICD-10-CM | POA: Diagnosis not present

## 2021-03-15 LAB — BLADDER SCAN AMB NON-IMAGING

## 2021-03-15 MED ORDER — GEMTESA 75 MG PO TABS: 75.0000 mg | ORAL_TABLET | Freq: Every day | ORAL | 0 refills | Status: DC

## 2021-03-21 ENCOUNTER — Other Ambulatory Visit: Payer: Self-pay

## 2021-03-21 ENCOUNTER — Inpatient Hospital Stay: Payer: Medicare Other

## 2021-03-21 ENCOUNTER — Inpatient Hospital Stay (HOSPITAL_BASED_OUTPATIENT_CLINIC_OR_DEPARTMENT_OTHER): Payer: Medicare Other | Admitting: Oncology

## 2021-03-21 ENCOUNTER — Inpatient Hospital Stay: Payer: Medicare Other | Attending: Nurse Practitioner

## 2021-03-21 ENCOUNTER — Encounter: Payer: Self-pay | Admitting: Oncology

## 2021-03-21 VITALS — BP 161/60 | HR 67 | Temp 97.8°F | Resp 16 | Ht 65.0 in | Wt 185.0 lb

## 2021-03-21 DIAGNOSIS — N189 Chronic kidney disease, unspecified: Secondary | ICD-10-CM

## 2021-03-21 DIAGNOSIS — N390 Urinary tract infection, site not specified: Secondary | ICD-10-CM | POA: Insufficient documentation

## 2021-03-21 DIAGNOSIS — N182 Chronic kidney disease, stage 2 (mild): Secondary | ICD-10-CM | POA: Insufficient documentation

## 2021-03-21 DIAGNOSIS — D631 Anemia in chronic kidney disease: Secondary | ICD-10-CM | POA: Insufficient documentation

## 2021-03-21 DIAGNOSIS — M545 Low back pain, unspecified: Secondary | ICD-10-CM | POA: Insufficient documentation

## 2021-03-21 DIAGNOSIS — D890 Polyclonal hypergammaglobulinemia: Secondary | ICD-10-CM | POA: Diagnosis not present

## 2021-03-21 DIAGNOSIS — Z79899 Other long term (current) drug therapy: Secondary | ICD-10-CM | POA: Insufficient documentation

## 2021-03-21 DIAGNOSIS — Z87440 Personal history of urinary (tract) infections: Secondary | ICD-10-CM | POA: Diagnosis not present

## 2021-03-21 LAB — CBC WITH DIFFERENTIAL/PLATELET
Abs Immature Granulocytes: 0.01 10*3/uL (ref 0.00–0.07)
Basophils Absolute: 0 10*3/uL (ref 0.0–0.1)
Basophils Relative: 0 %
Eosinophils Absolute: 0.4 10*3/uL (ref 0.0–0.5)
Eosinophils Relative: 5 %
HCT: 33.4 % — ABNORMAL LOW (ref 36.0–46.0)
Hemoglobin: 10.3 g/dL — ABNORMAL LOW (ref 12.0–15.0)
Immature Granulocytes: 0 %
Lymphocytes Relative: 25 %
Lymphs Abs: 2 10*3/uL (ref 0.7–4.0)
MCH: 27.1 pg (ref 26.0–34.0)
MCHC: 30.8 g/dL (ref 30.0–36.0)
MCV: 87.9 fL (ref 80.0–100.0)
Monocytes Absolute: 0.7 10*3/uL (ref 0.1–1.0)
Monocytes Relative: 9 %
Neutro Abs: 4.7 10*3/uL (ref 1.7–7.7)
Neutrophils Relative %: 61 %
Platelets: 234 10*3/uL (ref 150–400)
RBC: 3.8 MIL/uL — ABNORMAL LOW (ref 3.87–5.11)
RDW: 13.8 % (ref 11.5–15.5)
WBC: 7.8 10*3/uL (ref 4.0–10.5)
nRBC: 0 % (ref 0.0–0.2)

## 2021-03-21 LAB — IRON AND TIBC
Iron: 58 ug/dL (ref 28–170)
Saturation Ratios: 24 % (ref 10.4–31.8)
TIBC: 241 ug/dL — ABNORMAL LOW (ref 250–450)
UIBC: 183 ug/dL

## 2021-03-21 LAB — VITAMIN B12: Vitamin B-12: 446 pg/mL (ref 180–914)

## 2021-03-21 LAB — FERRITIN: Ferritin: 308 ng/mL — ABNORMAL HIGH (ref 11–307)

## 2021-03-21 LAB — FOLATE: Folate: 31 ng/mL (ref >5.9)

## 2021-03-21 MED ORDER — EPOETIN ALFA-EPBX 40000 UNIT/ML IJ SOLN
40000.0000 [IU] | Freq: Once | INTRAMUSCULAR | Status: AC
  Administered 2021-03-21: 10:00:00 40000 [IU] via SUBCUTANEOUS
  Filled 2021-03-21: qty 1

## 2021-03-21 NOTE — Progress Notes (Signed)
Pt having UTI and bladder leakage and has been seeing urology.

## 2021-03-21 NOTE — Progress Notes (Signed)
Hematology/Oncology Consult note [facility]  Telephone:([phone] [phone]  Patient Care Team: [name], MD as PCP - General (Family Medicine) Minna Merritts, MD as PCP - Cardiology (Cardiology) Diona Browner Paulino Door, MD as Referring Physician (Orthopedic Surgery) Minna Merritts, MD as Consulting Physician (Cardiology)   Name of the patient: [name] Cooper  [id number]  [birth year]   Date of visit: 03/21/21  Diagnosis- normocytic anemia likely secondary to chronic kidney disease  Chief complaint/ Reason for visit-routine follow-up of anemia of kidney disease  Heme/Onc history:  Patient is a 80 year old female who was seen by Dr. Sherrine Maples in the past for evaluation of anemia. Her hemoglobin has been ranging between 9.9-10.8 over the last couple of years. She is also noted to have chronic kidney disease with a creatinine ranging from 1.5-2. 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   Review of blood work from October 2017 was as follows:  methylmalonic acid was elevated at 778. TIBC was low and ferritin was elevated at 399. spep showed polyclonal gammopathy. Cbc showed white count of 9.2, H&H of 10.1/30.6 and a platelet count of 297. Reticulocyte count was low at 0.8 in appropriate for the degree of anemia. Normal immunofixation pattern was noted in urine   Patient was started on monthly B12 injections given elevated MMA   Results of blood work from 02/11/2020 were as follows: CBC showed white count of 12.6, H&H of 11/35.2 with an MCV of 86 and a platelet count of 279.  Reticulocyte count was mildly low for the degree of anemia at 0.8.  Haptoglobin elevated TSH was normal.  Myeloma panel showed polyclonal increase in immunoglobulins.  Serum free light chain ratio was mildly abnormal at 1.9.  TSH was normal at 2.1.  ESR was elevated at 80.  CMP showed creatinine of  1.6.  ANA comprehensive panel showed mildly elevated ENA IgG antibody of 1.7.   Patient is presently receiving Retacrit every 3 weeks  Interval history-she is followed by urology for overactive bladder and was recently switched from Myrbetriq to [place] which appears to be helping some.  Still having some nocturia around 2-3 episodes nightly.  Reports significant left-sided low back pain over the past week or so.  Has been treated for UTIs over the past few months.  Currently not on any antibiotics.  She also has left-sided radiculopathy and was recently referred back to [facility] for low back pain.  Has been using over-the-counter lidocaine patches but states they do not stay on for very long.  ECOG PS- 1 Pain scale- 0   Review of systems- Review of Systems  Constitutional: Negative.  Negative for chills, fever, malaise/fatigue and weight loss.  HENT:  Negative for congestion, ear pain and tinnitus.   Eyes: Negative.  Negative for blurred vision and double vision.  Respiratory: Negative.  Negative for cough, sputum production and shortness of breath.   Cardiovascular: Negative.  Negative for chest pain, palpitations and leg swelling.  Gastrointestinal: Negative.  Negative for abdominal pain, constipation, diarrhea, nausea and vomiting.  Genitourinary:  Positive for flank pain, frequency and urgency. Negative for dysuria.  Musculoskeletal:  Positive for back pain. Negative for falls.  Skin: Negative.  Negative for rash.  Neurological: Negative.  Negative for weakness and headaches.  Endo/Heme/Allergies: Negative.  Does not bruise/bleed easily.  Psychiatric/Behavioral: Negative.  Negative for depression. The patient is not nervous/anxious and does  not have insomnia.      Allergies  Allergen Reactions   Sulfa Antibiotics Anaphylaxis     Past Medical History:  Diagnosis Date   Anemia    Arthritis    left hip, left ankle   Cholelithiases    CKD (chronic kidney disease), stage II     Coronary artery disease    a. cardiac cath 01/2011: mid LAD stent patent, mild lumenal irreg's, nl EF 55%, no AS/MR   Fibromyalgia    Frozen shoulder    History of kidney stones    HTN (hypertension)    controlled with medication;    Hyperlipidemia    a. statin intolerant   Iron deficiency    Thrombocytopenia (HCC)    Ulcer of left lower extremity, limited to breakdown of skin (HCC)    UTI (urinary tract infection)    Venous stasis      Past Surgical History:  Procedure Laterality Date   CARDIAC CATHETERIZATION     stents placed in mid LAD   CERVICAL SPINE SURGERY     FETAL BLOOD TRANSFUSION  2014   Frozen Shoulder     KIDNEY STONE SURGERY     LEG SURGERY     broke her leg    LOWER EXTREMITY ANGIOGRAPHY Right 02/17/2019   Procedure: LOWER EXTREMITY ANGIOGRAPHY;  Surgeon: Katha Cabal, MD;  Location: [place] [facility];  Service: Cardiovascular;  Laterality: Right;   LOWER EXTREMITY ANGIOGRAPHY Left 03/18/2019   Procedure: LOWER EXTREMITY ANGIOGRAPHY;  Surgeon: Katha Cabal, MD;  Location: [place] [facility];  Service: Cardiovascular;  Laterality: Left;    Social History   Socioeconomic History   Marital status: Widowed    Spouse name: Not on file   Number of children: 3   Years of education: Not on file   Highest education level: Bachelor's degree (e.g., BA, AB, BS)  Occupational History   Not on file  Tobacco Use   Smoking status: Never   Smokeless tobacco: Never  Vaping Use   Vaping Use: Never used  Substance and Sexual Activity   Alcohol use: No   Drug use: No   Sexual activity: Not Currently  Other Topics Concern   Not on file  Social History Narrative   Not on file   Social Determinants of Health   Financial Resource Strain: Not on file  Food Insecurity: Not on file  Transportation Needs: Not on file  Physical Activity: Not on file  Stress: Not on file  Social Connections: Not on file  Intimate Partner Violence: Not on file     Family History  Problem Relation Age of Onset   Hypertension Mother    Heart attack Mother    Heart attack Father    Heart disease Brother        h/o CABG   Diabetes Brother    Kidney disease Brother    Kidney cancer Neg Hx    Bladder Cancer Neg Hx    Breast cancer Neg Hx      Current Outpatient Medications:    atorvastatin (LIPITOR) 20 MG tablet, TAKE 1 TABLET(20 MG) BY MOUTH DAILY, Disp: 90 tablet, Rfl: 3   clopidogrel (PLAVIX) 75 MG tablet, Take 1 tablet (75 mg total) by mouth daily., Disp: 90 tablet, Rfl: 4   donepezil (ARICEPT) 5 MG tablet, Take 5 mg by mouth daily., Disp: , Rfl:    estradiol (ESTRACE) 0.1 MG/GM vaginal cream, APPLY A PEA SIZE AMOUNT MONDAY, WEDNESDAY, AND FRIDAY NIGHT,  Disp: 42.5 g, Rfl: 12   ezetimibe (ZETIA) 10 MG tablet, Take 1 tablet (10 mg total) by mouth daily., Disp: 90 tablet, Rfl: 3   losartan-hydrochlorothiazide (HYZAAR) 100-25 MG tablet, Take 1 tablet by mouth daily., Disp: 90 tablet, Rfl: 3   meloxicam (MOBIC) 7.5 MG tablet, Take 1 tablet (7.5 mg total) by mouth daily., Disp: 30 tablet, Rfl: 0   nitroGLYCERIN (NITROSTAT) 0.4 MG SL tablet, Place 1 tablet (0.4 mg total) under the tongue every 5 (five) minutes as needed for chest pain., Disp: 25 tablet, Rfl: 3   temazepam (RESTORIL) 30 MG capsule, Take 30 mg by mouth at bedtime. , Disp: , Rfl:    Vibegron (GEMTESA) 75 MG TABS, Take 75 mg by mouth daily., Disp: 28 tablet, Rfl: 0 No current facility-administered medications for this visit.  Facility-Administered Medications Ordered in Other Visits:    epoetin alfa-epbx (RETACRIT) injection 40,000 Units, 40,000 Units, Subcutaneous, Q90 days, Sindy Guadeloupe, MD, 40,000 Units at 07/25/20 1113   epoetin alfa-epbx (RETACRIT) injection 40,000 Units, 40,000 Units, Subcutaneous, Once, Sindy Guadeloupe, MD  Physical exam:  There were no vitals filed for this visit.  Physical Exam Genitourinary:    Comments: Bil Flank pain Musculoskeletal:     Thoracic  back: Tenderness present.     Lumbar back: Tenderness present.     CMP Latest Ref Rng & Units 10/13/2020  Glucose 70 - 99 mg/dL -  BUN 8 - 23 mg/dL -  Creatinine 0.44 - 1.00 mg/dL 1.50(H)  Sodium 135 - 145 mmol/L -  Potassium 3.5 - 5.1 mmol/L -  Chloride 98 - 111 mmol/L -  CO2 22 - 32 mmol/L -  Calcium 8.9 - 10.3 mg/dL -  Total Protein 6.5 - 8.1 g/dL -  Total Bilirubin 0.3 - 1.2 mg/dL -  Alkaline Phos 38 - 126 U/L -  AST 15 - 41 U/L -  ALT 0 - 44 U/L -   CBC Latest Ref Rng & Units 02/01/2021  WBC 4.0 - 10.5 K/uL 8.7  Hemoglobin 12.0 - 15.0 g/dL 10.6(L)  Hematocrit 36.0 - 46.0 % 33.7(L)  Platelets 150 - 400 K/uL 222     Assessment and plan- Patient is a 80 y.o. female with anemia of chronic kidney disease here for routine follow-up.  Anemia due to CKD- Last received Retacrit on 02/01/2021 for hemoglobin of 10.6.  Per treatment parameters hold Retacrit for hemoglobin greater than 11.  Proceed with 40,000 units Retacrit today.  Return to clinic every 3 weeks for lab work and possible Retacrit and see Dr. Janese Banks back in 3 months.  Recurrent UTI- She is followed closely by urology.  Most recent urine culture showed greater than 1000 colonies Klebsiella pneumonia UTI.  She was seen by [facility] Islam urology on 03/15/2021 for recurrent UTIs due to age and vaginal atrophy.  Previously she was started on Myrbetriq for overactive bladder which was discontinued by neurology a few weeks ago.  She was started on Gemtesa 75 mg daily.   Low back pain- Referred back to [facility].  Currently using over-the-counter lidocaine patches but they peel off easily.  Some tape provided for patient to try to reinforce.   Disposition- 40,000 units Retacrit today. Return to clinic every 3 weeks for lab work and possible Retacrit for hemoglobin < 11 and in 3 months to see Dr. Janese Banks with labs plus or minus Retacrit.  I spent 25 minutes dedicated to the care of this patient (face-to-face and non-face-to-face)  on the date  of the encounter to include what is described in the assessment and plan.  Visit Diagnosis No diagnosis found.  Faythe Casa, NP 03/21/2021 9:34 AM

## 2021-03-23 ENCOUNTER — Ambulatory Visit: Payer: Medicare Other | Admitting: Physician Assistant

## 2021-04-06 ENCOUNTER — Other Ambulatory Visit: Payer: Self-pay | Admitting: *Deleted

## 2021-04-06 DIAGNOSIS — D631 Anemia in chronic kidney disease: Secondary | ICD-10-CM

## 2021-04-06 DIAGNOSIS — N189 Chronic kidney disease, unspecified: Secondary | ICD-10-CM

## 2021-04-11 ENCOUNTER — Inpatient Hospital Stay: Payer: Medicare Other

## 2021-04-11 ENCOUNTER — Other Ambulatory Visit: Payer: Self-pay | Admitting: Urology

## 2021-04-11 ENCOUNTER — Other Ambulatory Visit: Payer: Self-pay

## 2021-04-11 ENCOUNTER — Inpatient Hospital Stay: Payer: Medicare Other | Attending: Nurse Practitioner

## 2021-04-11 ENCOUNTER — Telehealth: Payer: Self-pay | Admitting: Urology

## 2021-04-11 DIAGNOSIS — N182 Chronic kidney disease, stage 2 (mild): Secondary | ICD-10-CM | POA: Insufficient documentation

## 2021-04-11 DIAGNOSIS — D631 Anemia in chronic kidney disease: Secondary | ICD-10-CM | POA: Diagnosis present

## 2021-04-11 DIAGNOSIS — N189 Chronic kidney disease, unspecified: Secondary | ICD-10-CM

## 2021-04-11 DIAGNOSIS — R3915 Urgency of urination: Secondary | ICD-10-CM

## 2021-04-11 LAB — CBC WITH DIFFERENTIAL/PLATELET
Abs Immature Granulocytes: 0.02 10*3/uL (ref 0.00–0.07)
Basophils Absolute: 0 10*3/uL (ref 0.0–0.1)
Basophils Relative: 1 %
Eosinophils Absolute: 0.3 10*3/uL (ref 0.0–0.5)
Eosinophils Relative: 4 %
HCT: 37 % (ref 36.0–46.0)
Hemoglobin: 11.4 g/dL — ABNORMAL LOW (ref 12.0–15.0)
Immature Granulocytes: 0 %
Lymphocytes Relative: 20 %
Lymphs Abs: 1.7 10*3/uL (ref 0.7–4.0)
MCH: 26.7 pg (ref 26.0–34.0)
MCHC: 30.8 g/dL (ref 30.0–36.0)
MCV: 86.7 fL (ref 80.0–100.0)
Monocytes Absolute: 0.7 10*3/uL (ref 0.1–1.0)
Monocytes Relative: 9 %
Neutro Abs: 5.4 10*3/uL (ref 1.7–7.7)
Neutrophils Relative %: 66 %
Platelets: 278 10*3/uL (ref 150–400)
RBC: 4.27 MIL/uL (ref 3.87–5.11)
RDW: 14 % (ref 11.5–15.5)
WBC: 8.1 10*3/uL (ref 4.0–10.5)
nRBC: 0 % (ref 0.0–0.2)

## 2021-04-11 LAB — IRON AND TIBC
Iron: 81 ug/dL (ref 28–170)
Saturation Ratios: 31 % (ref 10.4–31.8)
TIBC: 260 ug/dL (ref 250–450)
UIBC: 179 ug/dL

## 2021-04-11 LAB — FERRITIN: Ferritin: 266 ng/mL (ref 11–307)

## 2021-04-11 MED ORDER — GEMTESA 75 MG PO TABS: 75.0000 mg | ORAL_TABLET | Freq: Every day | ORAL | 0 refills | Status: DC

## 2021-04-11 NOTE — Telephone Encounter (Signed)
Spoke with patient and advised results   

## 2021-04-11 NOTE — Telephone Encounter (Signed)
Pt would like to know if a prescription for Leslye Peer could be called in for her today.

## 2021-04-17 NOTE — Progress Notes (Signed)
04/18/21 9:23 AM   [name] Cooper 1940/04/25 [id number]  Referring provider:  [name], MD [address] [facility] [place],  [place] [address]  Chief Complaint  Patient presents with   Over Active Bladder   Urological history  1. rUTI's -contributing factors of age and vaginal atrophy -RUS 10/14/2019 bilateral renal cysts, but no hydronephrosis or stones -cysto 08/2020 NED -managed with vaginal estrogen cream, cranberry tablets -documented positive urine cultures over the last year              Klebsiella pneumoniae resistant to ampicillin on June 17, 2020             Streptococcus anginosus group on Aug 01, 2020             E.coli resistant to ampicillin, ciprofloxacin, levofloxacin, tetracycline and trimethoprim/sulfa on 09/02/2020              E.coli resistant to ampicillin, ciprofloxacin, levofloxacin, tetracycline and trimethoprim/sulfa and Klebsiella pneumoniae resistant ampicillin and nitrofurantoin 09/22/2020  E.coli resistant to ampicillin, ciprofloxacin, gentamicin, levofloxacin, tobramycin and trimethoprim/sulfa on October 21, 2020  E.coli resistant to ampicillin, ciprofloxacin, levofloxacin, tetracycline and trimethoprim/sulfa on November 15, 2020  E.coli resistant to ampicillin, ciprofloxacin, levofloxacin, tetracycline and trimethoprim/sulfa on December 22, 2020  E.coli and Klebsiella pneumoniae 02/14/2021  Klebsiella pneumoniae resistant to ampicillin and nitrofurantoin on February 21, 2021         2. Vaginal atrophy -managed with vaginal estrogen cream three nights weekly   3. Nephrolithiasis -KUB 08/2017 right renal stones   4. Bilateral renal cysts -CTU 09/2020 - There are multiple simple and complex cystic renal lesions bilaterally. A lesion in the anterior interpolar region of the right kidney demonstrates thin partially calcified septations and measures 2.9 x 2.4 cm on image 25/4. This demonstrates no solid components or significant change from the  previous study. A similarly complex lesion in the upper pole of the left kidney demonstrates thin partially calcified septations and measures 4.5 x 4.0 cm on image 18/4, also similar to the previous study. Some of the simple cysts have mildly enlarged.   HPI: [name] Cooper is a 81 y.o.female who presents today for a one month follow after a trial of Gemtesa 75 mg daily.  At her visit on 03/15/2021, she was started on Gemtesa, we reviewed the differences between acute cystitis and asymptomatic bacteriuria and referred her to [facility] for back pain.  PVR 79 mL  She is having 1-7 daytime urinations, 1-2 episodes of nocturia and a mild urge to urinate.  She endorses stress urinary incontinence.  She has leakage 1-2 times weekly and wears absorbent pads on occasion.  She also occasionally engages in fluid intake and toilet mapping.  She states that she has noticed a big difference in her urination with the Gemtesa, but she could not be more specific.  She did not see the orthopedist regarding her back issues as she states her back issues were relieved with the Gemtesa.     Patient denies any modifying or aggravating factors.  Patient denies any gross hematuria, dysuria or suprapubic/flank pain.  Patient denies any fevers, chills, nausea or vomiting.    PMH: Past Medical History:  Diagnosis Date   Anemia    Arthritis    left hip, left ankle   Cholelithiases    CKD (chronic kidney disease), stage II    Coronary artery disease    a. cardiac cath 01/2011: mid LAD stent patent, mild lumenal irreg's, nl EF  55%, no AS/MR   Fibromyalgia    Frozen shoulder    History of kidney stones    HTN (hypertension)    controlled with medication;    Hyperlipidemia    a. statin intolerant   Iron deficiency    Thrombocytopenia (HCC)    Ulcer of left lower extremity, limited to breakdown of skin (HCC)    UTI (urinary tract infection)    Venous stasis     Surgical History: Past Surgical History:   Procedure Laterality Date   CARDIAC CATHETERIZATION     stents placed in mid LAD   CERVICAL SPINE SURGERY     FETAL BLOOD TRANSFUSION  2014   Frozen Shoulder     KIDNEY STONE SURGERY     LEG SURGERY     broke her leg    LOWER EXTREMITY ANGIOGRAPHY Right 02/17/2019   Procedure: LOWER EXTREMITY ANGIOGRAPHY;  Surgeon: Renford Dills, MD;  Location: [facility];  Service: Cardiovascular;  Laterality: Right;   LOWER EXTREMITY ANGIOGRAPHY Left 03/18/2019   Procedure: LOWER EXTREMITY ANGIOGRAPHY;  Surgeon: Renford Dills, MD;  Location: [facility];  Service: Cardiovascular;  Laterality: Left;    Home Medications:  Allergies as of 04/18/2021       Reactions   Sulfa Antibiotics Anaphylaxis        Medication List        Accurate as of April 18, 2021  9:23 AM. If you have any questions, ask your nurse or doctor.          atorvastatin 20 MG tablet Commonly known as: LIPITOR TAKE 1 TABLET(20 MG) BY MOUTH DAILY   clopidogrel 75 MG tablet Commonly known as: Plavix Take 1 tablet (75 mg total) by mouth daily.   donepezil 5 MG tablet Commonly known as: ARICEPT Take 5 mg by mouth daily.   estradiol 0.1 MG/GM vaginal cream Commonly known as: ESTRACE APPLY A PEA SIZE AMOUNT MONDAY, WEDNESDAY, AND FRIDAY NIGHT   Gemtesa 75 MG Tabs Generic drug: Vibegron Take 75 mg by mouth daily.   losartan-hydrochlorothiazide 100-25 MG tablet Commonly known as: HYZAAR Take 1 tablet by mouth daily.   meloxicam 7.5 MG tablet Commonly known as: Mobic Take 1 tablet (7.5 mg total) by mouth daily.   nitroGLYCERIN 0.4 MG SL tablet Commonly known as: NITROSTAT Place 1 tablet (0.4 mg total) under the tongue every 5 (five) minutes as needed for chest pain.   temazepam 30 MG capsule Commonly known as: RESTORIL Take 30 mg by mouth at bedtime.        Allergies:  Allergies  Allergen Reactions   Sulfa Antibiotics Anaphylaxis    Family History: Family History   Problem Relation Age of Onset   Hypertension Mother    Heart attack Mother    Heart attack Father    Heart disease Brother        h/o CABG   Diabetes Brother    Kidney disease Brother    Kidney cancer Neg Hx    Bladder Cancer Neg Hx    Breast cancer Neg Hx     Social History:  reports that she has never smoked. She has never used smokeless tobacco. She reports that she does not drink alcohol and does not use drugs.   Physical Exam: BP (!) 182/77    Pulse 64    Ht 5\' 9"  (1.753 m)    Wt 185 lb (83.9 kg)    BMI 27.32 kg/m   Constitutional:  Well  nourished. Alert and oriented, No acute distress. HEENT: [place] AT, mask in place.  Trachea midline Cardiovascular: No clubbing, cyanosis, or edema. Respiratory: Normal respiratory effort, no increased work of breathing. Neurologic: Grossly intact, no focal deficits, moving all 4 extremities. Psychiatric: Normal mood and affect.    Laboratory Data: CBC Latest Ref Rng & Units 04/11/2021 03/21/2021 02/01/2021  WBC 4.0 - 10.5 K/uL 8.1 7.8 8.7  Hemoglobin 12.0 - 15.0 g/dL 11.4(L) 10.3(L) 10.6(L)  Hematocrit 36.0 - 46.0 % 37.0 33.4(L) 33.7(L)  Platelets 150 - 400 K/uL 278 234 222  I have reviewed the labs.   Pertinent imaging  04/18/21 09:06  Scan Result 49mL     Assessment & Plan:    1. Urgency -at goal with Gemtesa 75 mg daily  -Prescription given for Gemtesa 75 mg daily and samples given as well, # 28   2. L-S radiculopathy -She did not accept the referral to [facility] as she states her back issues have resolved  3.  Memory loss -Continues to impact care  4.  Renal cysts -complex renal cysts -follow up renal ultrasound in 04/2021   Return in about 1 month (around 05/19/2021) for RUS report .  [facility] [address], [place], [place] [address] [phone]  Zara Council, PA-C

## 2021-04-18 ENCOUNTER — Ambulatory Visit: Payer: Medicare Other | Admitting: Urology

## 2021-04-18 ENCOUNTER — Encounter: Payer: Self-pay | Admitting: Urology

## 2021-04-18 ENCOUNTER — Other Ambulatory Visit: Payer: Self-pay

## 2021-04-18 VITALS — BP 182/77 | HR 64 | Ht 69.0 in | Wt 185.0 lb

## 2021-04-18 DIAGNOSIS — N3281 Overactive bladder: Secondary | ICD-10-CM

## 2021-04-18 DIAGNOSIS — N952 Postmenopausal atrophic vaginitis: Secondary | ICD-10-CM

## 2021-04-18 DIAGNOSIS — R413 Other amnesia: Secondary | ICD-10-CM | POA: Diagnosis not present

## 2021-04-18 DIAGNOSIS — Q6102 Congenital multiple renal cysts: Secondary | ICD-10-CM

## 2021-04-18 DIAGNOSIS — M5417 Radiculopathy, lumbosacral region: Secondary | ICD-10-CM | POA: Diagnosis not present

## 2021-04-18 LAB — BLADDER SCAN AMB NON-IMAGING

## 2021-04-18 MED ORDER — GEMTESA 75 MG PO TABS: 75.0000 mg | ORAL_TABLET | Freq: Every day | ORAL | 3 refills | Status: DC

## 2021-04-18 MED ORDER — GEMTESA 75 MG PO TABS: 75.0000 mg | ORAL_TABLET | Freq: Every day | ORAL | 3 refills | Status: AC

## 2021-04-25 ENCOUNTER — Other Ambulatory Visit: Payer: Self-pay | Admitting: *Deleted

## 2021-04-25 DIAGNOSIS — N189 Chronic kidney disease, unspecified: Secondary | ICD-10-CM

## 2021-04-25 DIAGNOSIS — D631 Anemia in chronic kidney disease: Secondary | ICD-10-CM

## 2021-05-02 ENCOUNTER — Inpatient Hospital Stay: Payer: Medicare Other | Attending: Nurse Practitioner

## 2021-05-02 ENCOUNTER — Inpatient Hospital Stay: Payer: Medicare Other

## 2021-05-02 ENCOUNTER — Other Ambulatory Visit: Payer: Self-pay

## 2021-05-02 VITALS — BP 156/85 | HR 64

## 2021-05-02 DIAGNOSIS — N182 Chronic kidney disease, stage 2 (mild): Secondary | ICD-10-CM | POA: Diagnosis present

## 2021-05-02 DIAGNOSIS — N189 Chronic kidney disease, unspecified: Secondary | ICD-10-CM

## 2021-05-02 DIAGNOSIS — D631 Anemia in chronic kidney disease: Secondary | ICD-10-CM | POA: Diagnosis present

## 2021-05-02 LAB — HEMOGLOBIN AND HEMATOCRIT, BLOOD
HCT: 33.4 % — ABNORMAL LOW (ref 36.0–46.0)
Hemoglobin: 10.4 g/dL — ABNORMAL LOW (ref 12.0–15.0)

## 2021-05-02 MED ORDER — EPOETIN ALFA-EPBX 40000 UNIT/ML IJ SOLN
40000.0000 [IU] | Freq: Once | INTRAMUSCULAR | Status: AC
  Administered 2021-05-02: 40000 [IU] via SUBCUTANEOUS
  Filled 2021-05-02: qty 1

## 2021-05-18 ENCOUNTER — Ambulatory Visit: Payer: Medicare Other | Admitting: Urology

## 2021-05-18 NOTE — Progress Notes (Signed)
05/21/21 6:20 PM   [name] Cooper 08-Aug-1940 [id number]  Referring provider:  [name], MD [address] [facility] [place],  [place] [address]  Chief Complaint  Patient presents with   Over Active Bladder   Urological history  1. rUTI's -contributing factors of age and vaginal atrophy -RUS 10/14/2019 bilateral renal cysts, but no hydronephrosis or stones -cysto 08/2020 NED -managed with vaginal estrogen cream, cranberry tablets -documented positive urine cultures over the last year              Klebsiella pneumoniae resistant to ampicillin on June 17, 2020             Streptococcus anginosus group on Aug 01, 2020             E.coli resistant to ampicillin, ciprofloxacin, levofloxacin, tetracycline and trimethoprim/sulfa on 09/02/2020              E.coli resistant to ampicillin, ciprofloxacin, levofloxacin, tetracycline and trimethoprim/sulfa and Klebsiella pneumoniae resistant ampicillin and nitrofurantoin 09/22/2020  E.coli resistant to ampicillin, ciprofloxacin, gentamicin, levofloxacin, tobramycin and trimethoprim/sulfa on October 21, 2020  E.coli resistant to ampicillin, ciprofloxacin, levofloxacin, tetracycline and trimethoprim/sulfa on November 15, 2020  E.coli resistant to ampicillin, ciprofloxacin, levofloxacin, tetracycline and trimethoprim/sulfa on December 22, 2020  E.coli and Klebsiella pneumoniae 02/14/2021  Klebsiella pneumoniae resistant to ampicillin and nitrofurantoin on February 21, 2021         2. Vaginal atrophy -managed with vaginal estrogen cream three nights weekly   3. Nephrolithiasis -CTU 09/2020 - right renal stones   4. Bilateral renal cysts -CTU 09/2020 - There are multiple simple and complex cystic renal lesions bilaterally. A lesion in the anterior interpolar region of the right kidney demonstrates thin partially calcified septations and measures 2.9 x 2.4 cm on image 25/4. This demonstrates no solid components or significant change from  the previous study. A similarly complex lesion in the upper pole of the left kidney demonstrates thin partially calcified septations and measures 4.5 x 4.0 cm on image 18/4, also similar to the previous study. Some of the simple cysts have mildly enlarged.   HPI: [name] Cooper is a 81 y.o.female who presents today for a one month follow to review RUS, but she has not had her RUS as of this appointment.   At her last visit, she was at goal with the Gemtesa 75 mg daily.  She continues to have no urinary complaints.   Patient denies any modifying or aggravating factors.  Patient denies any gross hematuria, dysuria or suprapubic/flank pain.  Patient denies any fevers, chills, nausea or vomiting.    PVR 38 mL   PMH: Past Medical History:  Diagnosis Date   Anemia    Arthritis    left hip, left ankle   Cholelithiases    CKD (chronic kidney disease), stage II    Coronary artery disease    a. cardiac cath 01/2011: mid LAD stent patent, mild lumenal irreg's, nl EF 55%, no AS/MR   Fibromyalgia    Frozen shoulder    History of kidney stones    HTN (hypertension)    controlled with medication;    Hyperlipidemia    a. statin intolerant   Iron deficiency    Thrombocytopenia (HCC)    Ulcer of left lower extremity, limited to breakdown of skin (HCC)    UTI (urinary tract infection)    Venous stasis     Surgical History: Past Surgical History:  Procedure Laterality Date   CARDIAC CATHETERIZATION  stents placed in mid LAD   CERVICAL SPINE SURGERY     FETAL BLOOD TRANSFUSION  2014   Frozen Shoulder     KIDNEY STONE SURGERY     LEG SURGERY     broke her leg    LOWER EXTREMITY ANGIOGRAPHY Right 02/17/2019   Procedure: LOWER EXTREMITY ANGIOGRAPHY;  Surgeon: Katha Cabal, MD;  Location: [place] [facility];  Service: Cardiovascular;  Laterality: Right;   LOWER EXTREMITY ANGIOGRAPHY Left 03/18/2019   Procedure: LOWER EXTREMITY ANGIOGRAPHY;  Surgeon: Katha Cabal, MD;   Location: [place] [facility];  Service: Cardiovascular;  Laterality: Left;    Home Medications:  Allergies as of 05/19/2021       Reactions   Sulfa Antibiotics Anaphylaxis        Medication List        Accurate as of May 19, 2021 11:59 PM. If you have any questions, ask your nurse or doctor.          atorvastatin 20 MG tablet Commonly known as: LIPITOR TAKE 1 TABLET(20 MG) BY MOUTH DAILY   clopidogrel 75 MG tablet Commonly known as: Plavix Take 1 tablet (75 mg total) by mouth daily.   donepezil 5 MG tablet Commonly known as: ARICEPT Take 5 mg by mouth daily.   estradiol 0.1 MG/GM vaginal cream Commonly known as: ESTRACE APPLY A PEA SIZE AMOUNT MONDAY, WEDNESDAY, AND FRIDAY NIGHT   Gemtesa 75 MG Tabs Generic drug: Vibegron Take 75 mg by mouth daily.   losartan-hydrochlorothiazide 100-25 MG tablet Commonly known as: HYZAAR Take 1 tablet by mouth daily.   meloxicam 7.5 MG tablet Commonly known as: Mobic Take 1 tablet (7.5 mg total) by mouth daily.   nitroGLYCERIN 0.4 MG SL tablet Commonly known as: NITROSTAT Place 1 tablet (0.4 mg total) under the tongue every 5 (five) minutes as needed for chest pain.   temazepam 30 MG capsule Commonly known as: RESTORIL Take 30 mg by mouth at bedtime.        Allergies:  Allergies  Allergen Reactions   Sulfa Antibiotics Anaphylaxis    Family History: Family History  Problem Relation Age of Onset   Hypertension Mother    Heart attack Mother    Heart attack Father    Heart disease Brother        h/o CABG   Diabetes Brother    Kidney disease Brother    Kidney cancer Neg Hx    Bladder Cancer Neg Hx    Breast cancer Neg Hx     Social History:  reports that she has never smoked. She has never used smokeless tobacco. She reports that she does not drink alcohol and does not use drugs.   Physical Exam: BP (!) 157/77 (BP Location: Left Arm, Patient Position: Sitting, Cuff Size: Large)    Pulse 73    Ht  5\' 7"  (1.702 m)    Wt 190 lb 12.8 oz (86.5 kg)    BMI 29.88 kg/m   Constitutional:  Well nourished. Alert and oriented, No acute distress. HEENT: [place] AT, moist mucus membranes.  Trachea midline Cardiovascular: No clubbing, cyanosis, or edema. Respiratory: Normal respiratory effort, no increased work of breathing. Neurologic: Grossly intact, no focal deficits, moving all 4 extremities. Psychiatric: Normal mood and affect.    Laboratory Data: N/A  Pertinent imaging RUS pending    Assessment & Plan:    1. OAB -at goal with Gemtesa 75 mg daily   2.  Memory loss -Continues to impact  care  3. Renal cysts -complex renal cysts -RUS pending   Return for RUS .  [place] [facility] [place] [address] [place], [place] [address] [phone]  Zara Council, PA-C

## 2021-05-19 ENCOUNTER — Other Ambulatory Visit: Payer: Self-pay

## 2021-05-19 ENCOUNTER — Encounter: Payer: Self-pay | Admitting: Urology

## 2021-05-19 ENCOUNTER — Ambulatory Visit: Payer: Medicare Other | Admitting: Urology

## 2021-05-19 VITALS — BP 157/77 | HR 73 | Ht 67.0 in | Wt 190.8 lb

## 2021-05-19 DIAGNOSIS — Q6102 Congenital multiple renal cysts: Secondary | ICD-10-CM | POA: Diagnosis not present

## 2021-05-19 DIAGNOSIS — N3281 Overactive bladder: Secondary | ICD-10-CM

## 2021-05-19 LAB — BLADDER SCAN AMB NON-IMAGING

## 2021-05-19 NOTE — Patient Instructions (Signed)
Arrive for your ultrasound by 4:00 in the medical mall on Friday March 3rd. Drink 32oz of fluids 30 mins prior to your ultrasound and do not void.

## 2021-05-23 ENCOUNTER — Other Ambulatory Visit: Payer: Self-pay

## 2021-05-23 ENCOUNTER — Inpatient Hospital Stay: Payer: Medicare Other

## 2021-05-23 VITALS — BP 160/78 | HR 66

## 2021-05-23 DIAGNOSIS — D631 Anemia in chronic kidney disease: Secondary | ICD-10-CM

## 2021-05-23 DIAGNOSIS — N189 Chronic kidney disease, unspecified: Secondary | ICD-10-CM

## 2021-05-23 DIAGNOSIS — N182 Chronic kidney disease, stage 2 (mild): Secondary | ICD-10-CM | POA: Diagnosis not present

## 2021-05-23 LAB — HEMOGLOBIN AND HEMATOCRIT, BLOOD
HCT: 35.2 % — ABNORMAL LOW (ref 36.0–46.0)
Hemoglobin: 10.6 g/dL — ABNORMAL LOW (ref 12.0–15.0)

## 2021-05-23 MED ORDER — EPOETIN ALFA-EPBX 40000 UNIT/ML IJ SOLN
40000.0000 [IU] | Freq: Once | INTRAMUSCULAR | Status: AC
  Administered 2021-05-23: 40000 [IU] via SUBCUTANEOUS
  Filled 2021-05-23: qty 1

## 2021-05-26 ENCOUNTER — Ambulatory Visit: Payer: Medicare Other | Attending: Urology

## 2021-06-13 ENCOUNTER — Other Ambulatory Visit: Payer: Medicare Other

## 2021-06-13 ENCOUNTER — Ambulatory Visit: Payer: Medicare Other

## 2021-06-13 ENCOUNTER — Ambulatory Visit: Payer: Medicare Other | Admitting: Oncology

## 2021-06-21 ENCOUNTER — Other Ambulatory Visit: Payer: Self-pay

## 2021-06-21 ENCOUNTER — Inpatient Hospital Stay: Payer: Medicare Other | Admitting: Medical Oncology

## 2021-06-21 ENCOUNTER — Inpatient Hospital Stay: Payer: Medicare Other

## 2021-06-21 ENCOUNTER — Inpatient Hospital Stay: Payer: Medicare Other | Attending: Nurse Practitioner

## 2021-06-21 ENCOUNTER — Encounter: Payer: Self-pay | Admitting: Medical Oncology

## 2021-06-21 VITALS — BP 161/64 | HR 55 | Temp 97.2°F | Resp 16 | Ht 67.0 in | Wt 196.6 lb

## 2021-06-21 DIAGNOSIS — N182 Chronic kidney disease, stage 2 (mild): Secondary | ICD-10-CM | POA: Insufficient documentation

## 2021-06-21 DIAGNOSIS — N189 Chronic kidney disease, unspecified: Secondary | ICD-10-CM

## 2021-06-21 DIAGNOSIS — D631 Anemia in chronic kidney disease: Secondary | ICD-10-CM | POA: Insufficient documentation

## 2021-06-21 LAB — CBC WITH DIFFERENTIAL/PLATELET
Abs Immature Granulocytes: 0.02 10*3/uL (ref 0.00–0.07)
Basophils Absolute: 0 10*3/uL (ref 0.0–0.1)
Basophils Relative: 0 %
Eosinophils Absolute: 0.3 10*3/uL (ref 0.0–0.5)
Eosinophils Relative: 4 %
HCT: 36.1 % (ref 36.0–46.0)
Hemoglobin: 11 g/dL — ABNORMAL LOW (ref 12.0–15.0)
Immature Granulocytes: 0 %
Lymphocytes Relative: 23 %
Lymphs Abs: 1.6 10*3/uL (ref 0.7–4.0)
MCH: 26.4 pg (ref 26.0–34.0)
MCHC: 30.5 g/dL (ref 30.0–36.0)
MCV: 86.6 fL (ref 80.0–100.0)
Monocytes Absolute: 0.6 10*3/uL (ref 0.1–1.0)
Monocytes Relative: 9 %
Neutro Abs: 4.3 10*3/uL (ref 1.7–7.7)
Neutrophils Relative %: 64 %
Platelets: 242 10*3/uL (ref 150–400)
RBC: 4.17 MIL/uL (ref 3.87–5.11)
RDW: 14.1 % (ref 11.5–15.5)
WBC: 6.9 10*3/uL (ref 4.0–10.5)
nRBC: 0 % (ref 0.0–0.2)

## 2021-06-21 LAB — FERRITIN: Ferritin: 243 ng/mL (ref 11–307)

## 2021-06-21 LAB — IRON AND TIBC
Iron: 55 ug/dL (ref 28–170)
Saturation Ratios: 21 % (ref 10.4–31.8)
TIBC: 258 ug/dL (ref 250–450)
UIBC: 203 ug/dL

## 2021-06-21 NOTE — Progress Notes (Signed)
? ? ? ?Hematology/Oncology Consult note ?[place]  ?[phone] [phone] ? ?Patient Care Team: ?[name], MD as PCP - General (Family Medicine) ?Minna Merritts, MD as PCP - Cardiology (Cardiology) ?Colleen Can, MD as Referring Physician (Orthopedic Surgery) ?Minna Merritts, MD as Consulting Physician (Cardiology) ?Sindy Guadeloupe, MD as Consulting Physician (Oncology)  ? ?Name of the patient: [name] Cooper  ?[id number]  ?November 15, 1940  ? ?Date of visit: 06/21/21 ? ?Diagnosis- normocytic anemia likely secondary to chronic kidney disease ? ?Chief complaint/ Reason for visit-routine follow-up of anemia of kidney disease ? ?Heme/Onc history:  ?Patient is a 81 year old female who was seen by Dr. Sherrine Maples in the past for evaluation of anemia. Her hemoglobin has been ranging between 9.9-10.8 over the last couple of years. She is also noted to have chronic kidney disease with a creatinine ranging from 1.5-2. 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 ?  ?Review of blood work from October 2017 was as follows:  methylmalonic acid was elevated at 778. TIBC was low and ferritin was elevated at 399. spep showed polyclonal gammopathy. Cbc showed white count of 9.2, H&H of 10.1/30.6 and a platelet count of 297. Reticulocyte count was low at 0.8 in appropriate for the degree of anemia. Normal immunofixation pattern was noted in urine ?  ?Patient was started on monthly B12 injections given elevated MMA ?  ?Results of blood work from 02/11/2020 were as follows: CBC showed white count of 12.6, H&H of 11/35.2 with an MCV of 86 and a platelet count of 279.  Reticulocyte count was mildly low for the degree of anemia at 0.8.  Haptoglobin elevated TSH was normal.  Myeloma panel showed polyclonal increase in immunoglobulins.  Serum free light chain ratio was mildly abnormal at 1.9.  TSH was normal at  2.1.  ESR was elevated at 80.  CMP showed creatinine of 1.6.  ANA comprehensive panel showed mildly elevated ENA IgG antibody of 1.7. ?  ?Patient is presently receiving Retacrit every 3 weeks ? ?Interval history-  ? ?Patient reports that she has been doing well. She has some general joint aches and pains but has no other concerns. These pains have been present for weeks to months. No rash, weakness. She has not discussed these with her PCP.  ? ?ECOG PS- 1 ?Pain scale- 0 ? ? ?Review of systems- Review of Systems  ?Constitutional: Negative.  Negative for chills, fever, malaise/fatigue and weight loss.  ?HENT:  Negative for congestion, ear pain and tinnitus.   ?Eyes: Negative.  Negative for blurred vision and double vision.  ?Respiratory: Negative.  Negative for cough, sputum production and shortness of breath.   ?Cardiovascular: Negative.  Negative for chest pain, palpitations and leg swelling.  ?Gastrointestinal: Negative.  Negative for abdominal pain, constipation, diarrhea, nausea and vomiting.  ?Genitourinary:  Negative for dysuria, flank pain, frequency and urgency.  ?Musculoskeletal:  Positive for joint pain. Negative for back pain and falls.  ?Skin: Negative.  Negative for rash.  ?Neurological: Negative.  Negative for weakness and headaches.  ?Endo/Heme/Allergies: Negative.  Does not bruise/bleed easily.  ?Psychiatric/Behavioral: Negative.  Negative for depression. The patient is not nervous/anxious and does not have insomnia.    ? ? ?Allergies  ?Allergen Reactions  ? Sulfa Antibiotics Anaphylaxis  ? ? ? ?Past Medical History:  ?Diagnosis Date  ? Anemia   ? Arthritis   ? left hip, left ankle  ?  Cholelithiases   ? CKD (chronic kidney disease), stage II   ? Coronary artery disease   ? a. cardiac cath 01/2011: mid LAD stent patent, mild lumenal irreg's, nl EF 55%, no AS/MR  ? Fibromyalgia   ? Frozen shoulder   ? History of kidney stones   ? HTN (hypertension)   ? controlled with medication;   ? Hyperlipidemia   ?  a. statin intolerant  ? Iron deficiency   ? Thrombocytopenia ([place])   ? Ulcer of left lower extremity, limited to breakdown of skin ([place])   ? UTI (urinary tract infection)   ? Venous stasis   ? ? ? ?Past Surgical History:  ?Procedure Laterality Date  ? CARDIAC CATHETERIZATION    ? stents placed in mid LAD  ? CERVICAL SPINE SURGERY    ? FETAL BLOOD TRANSFUSION  2014  ? Frozen Shoulder    ? KIDNEY STONE SURGERY    ? LEG SURGERY    ? broke her leg   ? LOWER EXTREMITY ANGIOGRAPHY Right 02/17/2019  ? Procedure: LOWER EXTREMITY ANGIOGRAPHY;  Surgeon: Katha Cabal, MD;  Location: [facility];  Service: Cardiovascular;  Laterality: Right;  ? LOWER EXTREMITY ANGIOGRAPHY Left 03/18/2019  ? Procedure: LOWER EXTREMITY ANGIOGRAPHY;  Surgeon: Katha Cabal, MD;  Location: [facility];  Service: Cardiovascular;  Laterality: Left;  ? ? ?Social History  ? ?Socioeconomic History  ? Marital status: Widowed  ?  Spouse name: Not on file  ? Number of children: 3  ? Years of education: Not on file  ? Highest education level: Bachelor's degree (e.g., BA, AB, BS)  ?Occupational History  ? Not on file  ?Tobacco Use  ? Smoking status: Never  ? Smokeless tobacco: Never  ?Vaping Use  ? Vaping Use: Never used  ?Substance and Sexual Activity  ? Alcohol use: No  ? Drug use: No  ? Sexual activity: Not Currently  ?  Birth control/protection: Post-menopausal  ?Other Topics Concern  ? Not on file  ?Social History Narrative  ? Not on file  ? ?Social Determinants of Health  ? ?Financial Resource Strain: Not on file  ?Food Insecurity: Not on file  ?Transportation Needs: Not on file  ?Physical Activity: Not on file  ?Stress: Not on file  ?Social Connections: Not on file  ?Intimate Partner Violence: Not on file  ? ? ?Family History  ?Problem Relation Age of Onset  ? Hypertension Mother   ? Heart attack Mother   ? Heart attack Father   ? Heart disease Brother   ?     h/o CABG  ? Diabetes Brother   ? Kidney disease Brother   ?  Kidney cancer Neg Hx   ? Bladder Cancer Neg Hx   ? Breast cancer Neg Hx   ? ? ? ?Current Outpatient Medications:  ?  atorvastatin (LIPITOR) 20 MG tablet, TAKE 1 TABLET(20 MG) BY MOUTH DAILY, Disp: 90 tablet, Rfl: 3 ?  clopidogrel (PLAVIX) 75 MG tablet, Take 1 tablet (75 mg total) by mouth daily., Disp: 90 tablet, Rfl: 4 ?  donepezil (ARICEPT) 5 MG tablet, Take 5 mg by mouth daily., Disp: , Rfl:  ?  losartan-hydrochlorothiazide (HYZAAR) 100-25 MG tablet, Take 1 tablet by mouth daily., Disp: 90 tablet, Rfl: 3 ?  nitroGLYCERIN (NITROSTAT) 0.4 MG SL tablet, Place 1 tablet (0.4 mg total) under the tongue every 5 (five) minutes as needed for chest pain., Disp: 25 tablet, Rfl: 3 ?  temazepam (RESTORIL) 30 MG  capsule, Take 30 mg by mouth at bedtime. , Disp: , Rfl:  ?  estradiol (ESTRACE) 0.1 MG/GM vaginal cream, APPLY A PEA SIZE AMOUNT MONDAY, WEDNESDAY, AND FRIDAY NIGHT (Patient not taking: Reported on 06/21/2021), Disp: 42.5 g, Rfl: 12 ?  meloxicam (MOBIC) 7.5 MG tablet, Take 1 tablet (7.5 mg total) by mouth daily. (Patient not taking: Reported on 06/21/2021), Disp: 30 tablet, Rfl: 0 ?  Vibegron (GEMTESA) 75 MG TABS, Take 75 mg by mouth daily. (Patient not taking: Reported on 06/21/2021), Disp: 90 tablet, Rfl: 3 ?No current facility-administered medications for this visit. ? ?Facility-Administered Medications Ordered in Other Visits:  ?  epoetin alfa-epbx (RETACRIT) injection 40,000 Units, 40,000 Units, Subcutaneous, Q90 days, Sindy Guadeloupe, MD, 40,000 Units at 07/25/20 1113 ?  epoetin alfa-epbx (RETACRIT) injection 40,000 Units, 40,000 Units, Subcutaneous, Once, Sindy Guadeloupe, MD ? ?Physical exam:  ?Vitals:  ? 06/21/21 1100  ?BP: (!) 161/64  ?Pulse: (!) 55  ?Resp: 16  ?Temp: (!) 97.2 ?F (36.2 ?C)  ?Weight: 196 lb 9.6 oz (89.2 kg)  ?Height: 5' 7"  (1.702 m)  ? ? ?Physical Exam ?Vitals and nursing note reviewed.  ?Constitutional:   ?   General: She is not in acute distress. ?   Appearance: Normal appearance. She is obese.  She is not ill-appearing, toxic-appearing or diaphoretic.  ?Eyes:  ?   Conjunctiva/sclera: Conjunctivae normal.  ?Cardiovascular:  ?   Rate and Rhythm: Normal rate and regular rhythm.  ?   Heart sounds: Constance Holster

## 2021-06-21 NOTE — Progress Notes (Signed)
Patient denies new problems/concerns today.   °

## 2021-07-11 ENCOUNTER — Other Ambulatory Visit: Payer: Self-pay | Admitting: Cardiovascular Disease

## 2021-07-12 ENCOUNTER — Inpatient Hospital Stay: Payer: Medicare Other | Attending: Oncology

## 2021-07-12 ENCOUNTER — Inpatient Hospital Stay: Payer: Medicare Other

## 2021-07-19 IMAGING — CR DG TOE GREAT 2+V*L*
1 series · 3 of 3 positions shown · non-contrast
Comparison: None.

CLINICAL DATA: Fall, great toe pain

EXAM:
LEFT GREAT TOE

[Series 1: dg toe great left · 0.14mm/px · 3 of 3 slices shown]
[im 1/3]
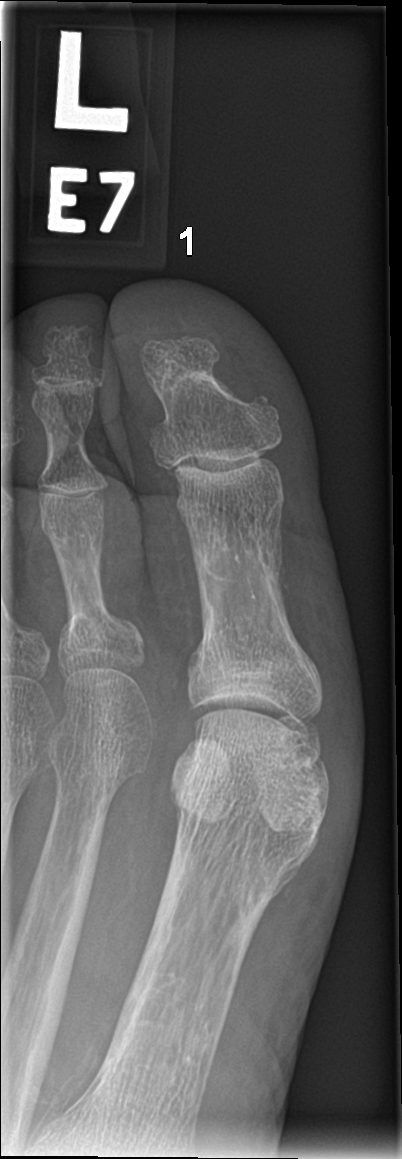
[im 2/3]
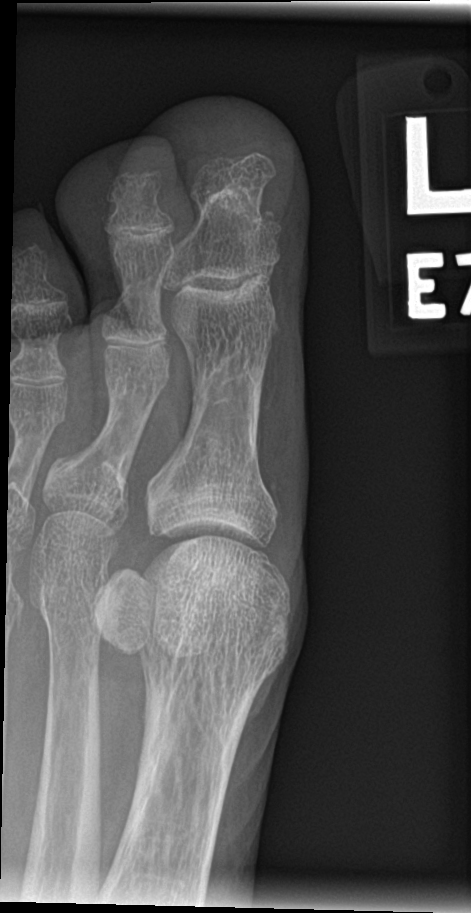
[im 3/3]
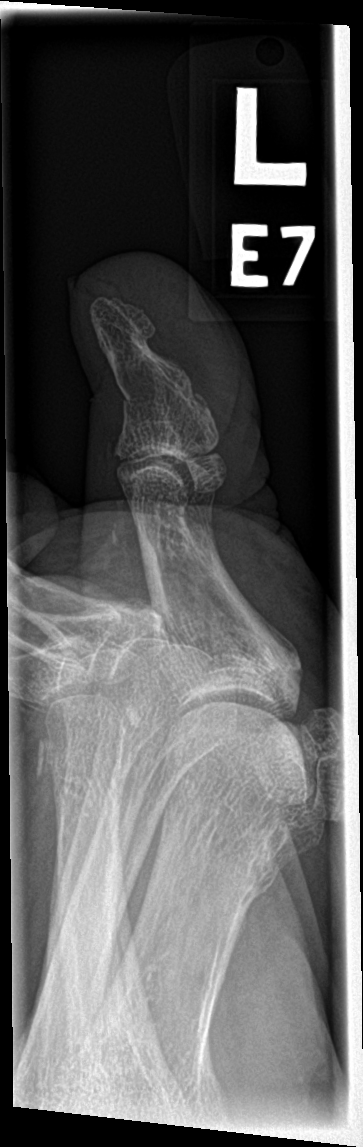

[3 of 3 positions shown; findings below may reference images not displayed]

FINDINGS: On the lateral view, there is a nondisplaced fracture involving the
1st distal phalanx. No definite intra-articular extension. This is
not visualized on the additional views.

The joint spaces are preserved.

Visualized soft tissues are within normal limits.
IMPRESSION: Nondisplaced fracture involving the 1st distal phalanx.

## 2021-07-19 IMAGING — CT CT CHEST W/O CM
2 of 4 series · 15 of 36 positions shown, 18 images · non-contrast
Comparison: Chest radiograph from earlier today.

CLINICAL DATA: Fall 1 week prior with bilateral breast pain.

EXAM:
CT CHEST WITHOUT CONTRAST
TECHNIQUE: Multidetector CT imaging of the chest was performed following the
standard protocol without IV contrast.

[Series 2: thorax · axial · 0.61mm/px · z∈[-222,+42]mm · 12 of 155 slices shown, 15 images]
[im 12/155  mediastinal]
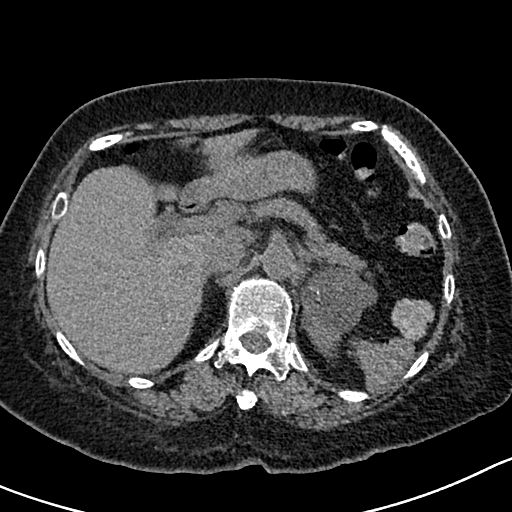
[im 12/155  lung]
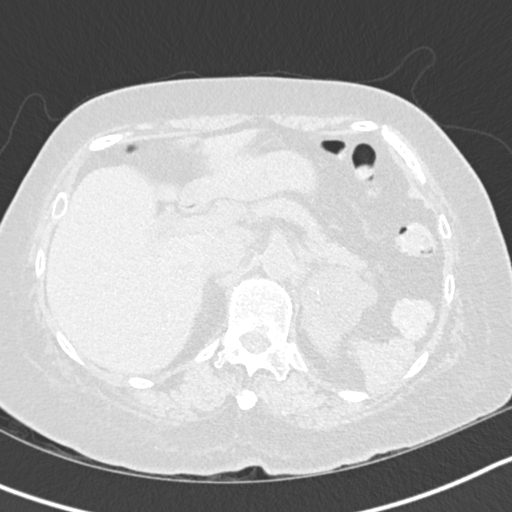
[im 23/155  lung]
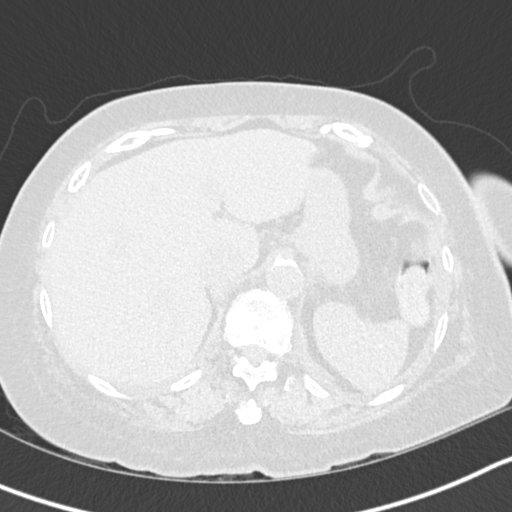
[im 34/155  lung]
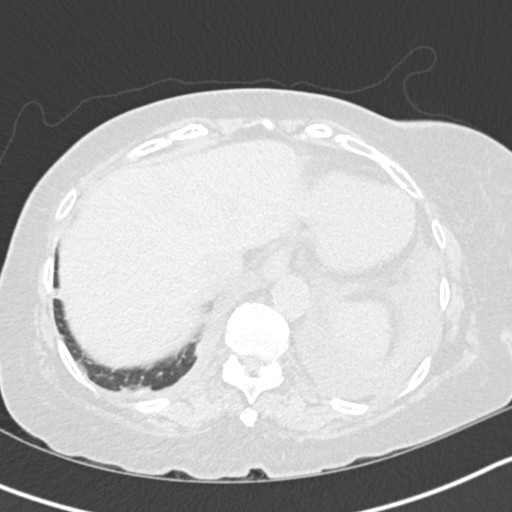
[im 45/155  lung]
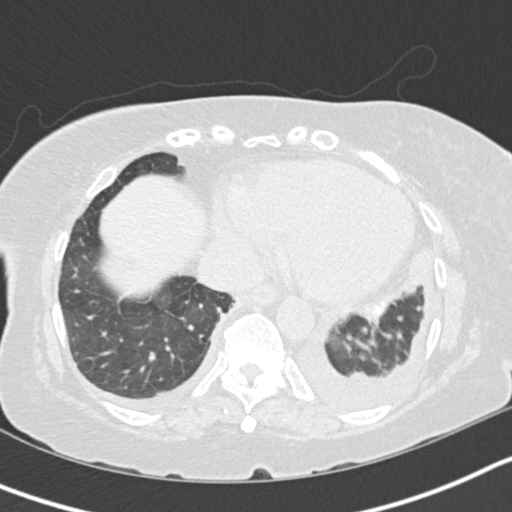
[im 56/155  mediastinal]
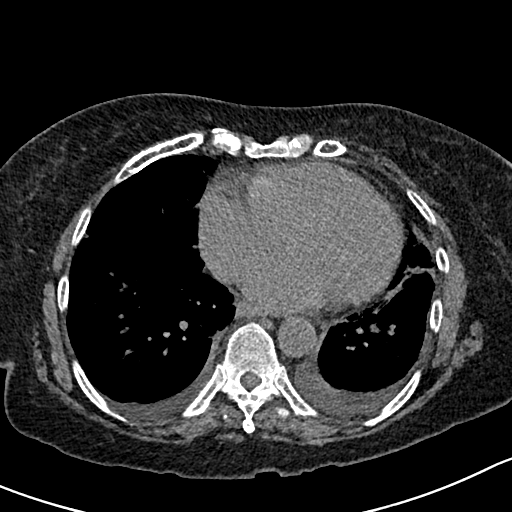
[im 56/155  lung]
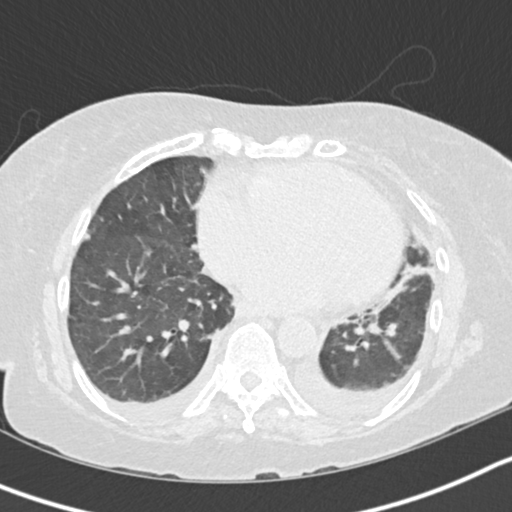
[im 67/155  lung]
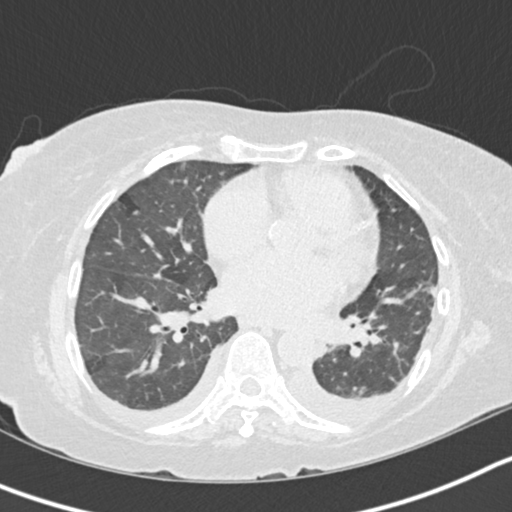
[im 89/155  lung]
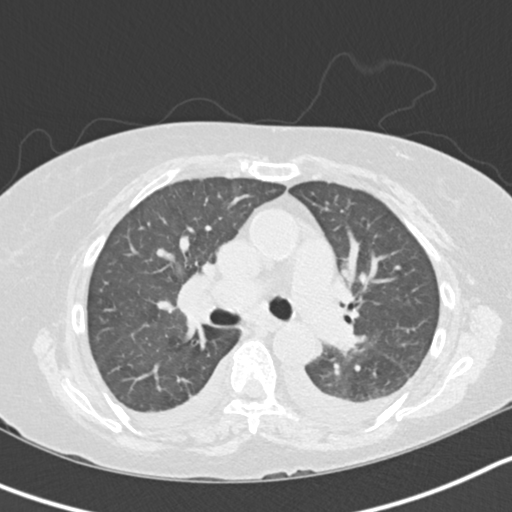
[im 100/155  lung]
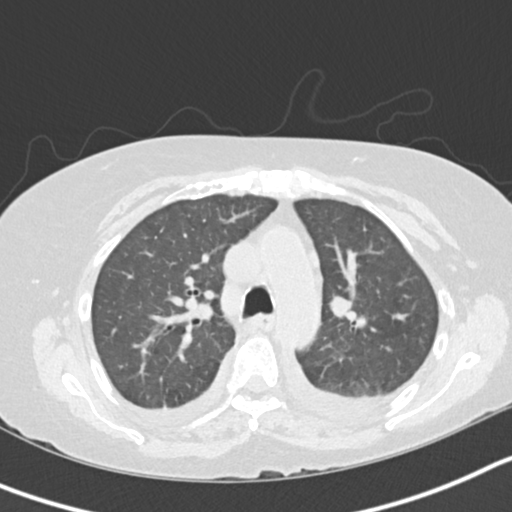
[im 111/155  mediastinal]
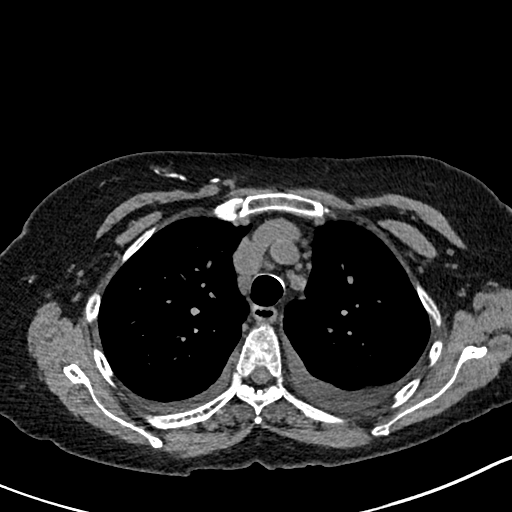
[im 111/155  lung]
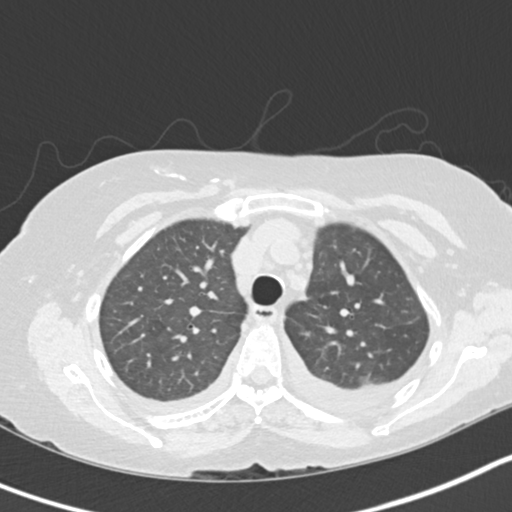
[im 122/155  lung]
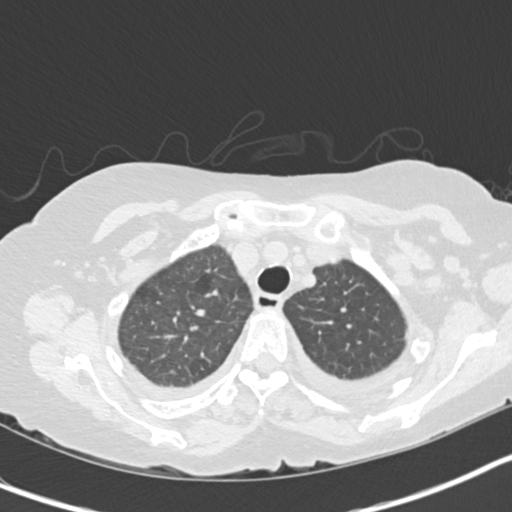
[im 133/155  lung]
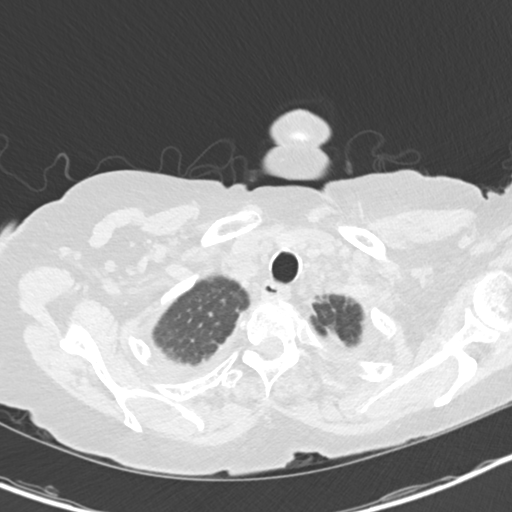
[im 144/155  lung]
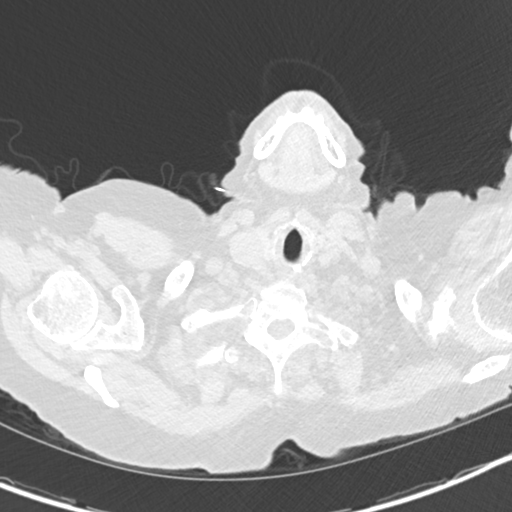

[Series 5: coronal · coronal · 0.59mm/px · 3 of 108 slices shown]
[im 22/108  lung]
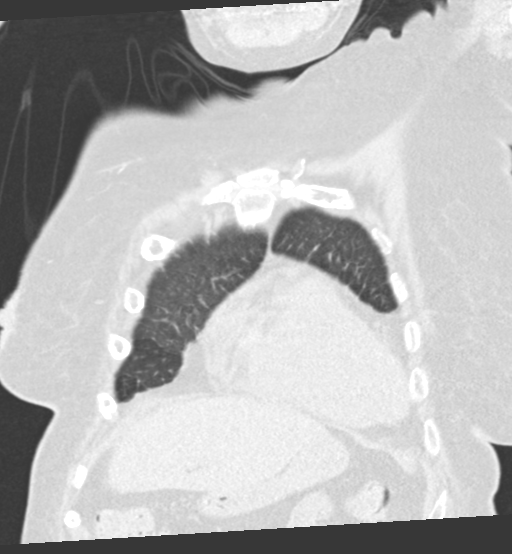
[im 43/108  lung]
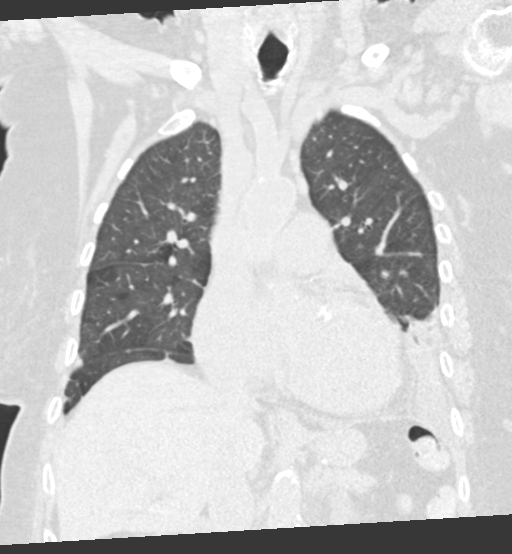
[im 65/108  lung]
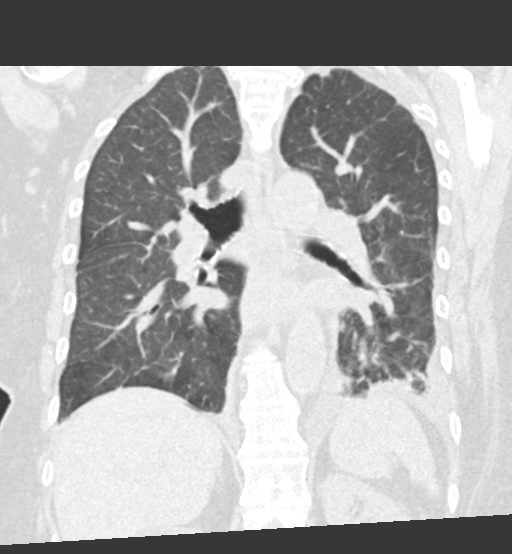

[15 of 36 positions shown; findings below may reference images not displayed]

FINDINGS: Cardiovascular: Borderline mild cardiomegaly. No significant
pericardial effusion/thickening. Left anterior descending coronary
atherosclerosis. Atherosclerotic nonaneurysmal thoracic aorta.
Normal caliber pulmonary arteries.

Mediastinum/Nodes: No discrete thyroid nodules. Unremarkable
esophagus. No pathologically enlarged axillary, mediastinal or hilar
lymph nodes, noting limited sensitivity for the detection of hilar
adenopathy on this noncontrast study.

Lungs/Pleura: No pneumothorax. Small dependent bilateral pleural
effusions, left slightly greater than right. Two solid scattered
right pulmonary nodules, largest 7 mm in the superior segment right
lower lobe (series 3/image 65). Calcified subcentimeter granuloma in
the posterior right upper lobe. No acute consolidative airspace
disease or lung masses. Mild diffuse interlobular septal thickening
with faint ground-glass opacity in both lungs. Mild passive
atelectasis at the left lung base.

Upper abdomen: Cholecystectomy. Simple 2.5 cm posteroinferior right
liver cyst. Complex 4.6 cm anterior upper left renal cyst with
suggestion of numerous faintly calcified internal septations.
Separate simple 2.3 cm posterior upper left renal cyst.

Musculoskeletal: No aggressive appearing focal osseous lesions. No
fractures. Mild thoracic spondylosis.
IMPRESSION: 1. No acute traumatic injury in the chest.
2. Small dependent bilateral pleural effusions, left slightly
greater than right.
3. Mild diffuse interlobular septal thickening with faint
ground-glass opacity in both lungs, suggesting mild pulmonary edema.
4. Borderline mild cardiomegaly.
5. Two solid scattered right pulmonary nodules, largest 7 mm in the
superior segment right lower lobe. Non-contrast chest CT at 3-6
months is recommended. If the nodules are stable at time of repeat
CT, then future CT at 18-24 months (from today's scan) is considered
optional for low-risk patients, but is recommended for high-risk
patients. This recommendation follows the consensus statement:
Guidelines for Management of Incidental Pulmonary Nodules Detected
[DATE].
6. One vessel coronary atherosclerosis.
7. Complex 4.6 cm anterior upper left renal cyst with suggestion of
numerous faintly calcified internal septations, similar size since
3025 CT abdomen. Consider further characterization with renal mass
protocol MRI (preferred) or CT abdomen without and with IV contrast.
8. Aortic Atherosclerosis (BOGZT-VIJ.J).

## 2021-08-02 ENCOUNTER — Inpatient Hospital Stay: Payer: Medicare Other

## 2021-08-02 ENCOUNTER — Inpatient Hospital Stay: Payer: Medicare Other | Attending: Oncology

## 2021-08-02 VITALS — BP 163/51 | HR 60

## 2021-08-02 DIAGNOSIS — D631 Anemia in chronic kidney disease: Secondary | ICD-10-CM | POA: Diagnosis present

## 2021-08-02 DIAGNOSIS — N183 Chronic kidney disease, stage 3 unspecified: Secondary | ICD-10-CM | POA: Diagnosis present

## 2021-08-02 LAB — CBC WITH DIFFERENTIAL/PLATELET
Abs Immature Granulocytes: 0.02 10*3/uL (ref 0.00–0.07)
Basophils Absolute: 0 10*3/uL (ref 0.0–0.1)
Basophils Relative: 0 %
Eosinophils Absolute: 0.3 10*3/uL (ref 0.0–0.5)
Eosinophils Relative: 3 %
HCT: 31.9 % — ABNORMAL LOW (ref 36.0–46.0)
Hemoglobin: 10 g/dL — ABNORMAL LOW (ref 12.0–15.0)
Immature Granulocytes: 0 %
Lymphocytes Relative: 20 %
Lymphs Abs: 1.5 10*3/uL (ref 0.7–4.0)
MCH: 26.8 pg (ref 26.0–34.0)
MCHC: 31.3 g/dL (ref 30.0–36.0)
MCV: 85.5 fL (ref 80.0–100.0)
Monocytes Absolute: 0.6 10*3/uL (ref 0.1–1.0)
Monocytes Relative: 8 %
Neutro Abs: 5 10*3/uL (ref 1.7–7.7)
Neutrophils Relative %: 69 %
Platelets: 210 10*3/uL (ref 150–400)
RBC: 3.73 MIL/uL — ABNORMAL LOW (ref 3.87–5.11)
RDW: 14.5 % (ref 11.5–15.5)
WBC: 7.3 10*3/uL (ref 4.0–10.5)
nRBC: 0 % (ref 0.0–0.2)

## 2021-08-02 MED ORDER — EPOETIN ALFA-EPBX 40000 UNIT/ML IJ SOLN
40000.0000 [IU] | Freq: Once | INTRAMUSCULAR | Status: AC
  Administered 2021-08-02: 40000 [IU] via SUBCUTANEOUS
  Filled 2021-08-02: qty 1

## 2021-08-22 ENCOUNTER — Other Ambulatory Visit: Payer: Self-pay

## 2021-08-22 DIAGNOSIS — D631 Anemia in chronic kidney disease: Secondary | ICD-10-CM

## 2021-08-23 ENCOUNTER — Inpatient Hospital Stay: Payer: Medicare Other

## 2021-08-23 DIAGNOSIS — D631 Anemia in chronic kidney disease: Secondary | ICD-10-CM

## 2021-08-23 DIAGNOSIS — N183 Chronic kidney disease, stage 3 unspecified: Secondary | ICD-10-CM | POA: Diagnosis not present

## 2021-08-23 LAB — CBC WITH DIFFERENTIAL/PLATELET
Abs Immature Granulocytes: 0.02 10*3/uL (ref 0.00–0.07)
Basophils Absolute: 0 10*3/uL (ref 0.0–0.1)
Basophils Relative: 0 %
Eosinophils Absolute: 0.2 10*3/uL (ref 0.0–0.5)
Eosinophils Relative: 3 %
HCT: 35.6 % — ABNORMAL LOW (ref 36.0–46.0)
Hemoglobin: 10.9 g/dL — ABNORMAL LOW (ref 12.0–15.0)
Immature Granulocytes: 0 %
Lymphocytes Relative: 22 %
Lymphs Abs: 1.6 10*3/uL (ref 0.7–4.0)
MCH: 26.8 pg (ref 26.0–34.0)
MCHC: 30.6 g/dL (ref 30.0–36.0)
MCV: 87.7 fL (ref 80.0–100.0)
Monocytes Absolute: 0.6 10*3/uL (ref 0.1–1.0)
Monocytes Relative: 9 %
Neutro Abs: 4.7 10*3/uL (ref 1.7–7.7)
Neutrophils Relative %: 66 %
Platelets: 297 10*3/uL (ref 150–400)
RBC: 4.06 MIL/uL (ref 3.87–5.11)
RDW: 14.8 % (ref 11.5–15.5)
WBC: 7.2 10*3/uL (ref 4.0–10.5)
nRBC: 0 % (ref 0.0–0.2)

## 2021-09-13 ENCOUNTER — Inpatient Hospital Stay: Payer: Medicare Other | Admitting: Oncology

## 2021-09-13 ENCOUNTER — Inpatient Hospital Stay: Payer: Medicare Other

## 2021-09-13 ENCOUNTER — Inpatient Hospital Stay: Payer: Medicare Other | Attending: Oncology

## 2021-09-13 ENCOUNTER — Encounter: Payer: Self-pay | Admitting: Oncology

## 2021-09-13 DIAGNOSIS — D631 Anemia in chronic kidney disease: Secondary | ICD-10-CM | POA: Diagnosis present

## 2021-09-13 DIAGNOSIS — N183 Chronic kidney disease, stage 3 unspecified: Secondary | ICD-10-CM | POA: Insufficient documentation

## 2021-09-13 DIAGNOSIS — Z79899 Other long term (current) drug therapy: Secondary | ICD-10-CM

## 2021-09-13 DIAGNOSIS — D649 Anemia, unspecified: Secondary | ICD-10-CM | POA: Diagnosis not present

## 2021-09-13 DIAGNOSIS — N189 Chronic kidney disease, unspecified: Secondary | ICD-10-CM

## 2021-09-13 LAB — CBC WITH DIFFERENTIAL/PLATELET
Abs Immature Granulocytes: 0.03 10*3/uL (ref 0.00–0.07)
Basophils Absolute: 0 10*3/uL (ref 0.0–0.1)
Basophils Relative: 0 %
Eosinophils Absolute: 0.2 10*3/uL (ref 0.0–0.5)
Eosinophils Relative: 1 %
HCT: 35.2 % — ABNORMAL LOW (ref 36.0–46.0)
Hemoglobin: 11.2 g/dL — ABNORMAL LOW (ref 12.0–15.0)
Immature Granulocytes: 0 %
Lymphocytes Relative: 17 %
Lymphs Abs: 2.2 10*3/uL (ref 0.7–4.0)
MCH: 27.3 pg (ref 26.0–34.0)
MCHC: 31.8 g/dL (ref 30.0–36.0)
MCV: 85.9 fL (ref 80.0–100.0)
Monocytes Absolute: 0.7 10*3/uL (ref 0.1–1.0)
Monocytes Relative: 6 %
Neutro Abs: 9.4 10*3/uL — ABNORMAL HIGH (ref 1.7–7.7)
Neutrophils Relative %: 76 %
Platelets: 213 10*3/uL (ref 150–400)
RBC: 4.1 MIL/uL (ref 3.87–5.11)
RDW: 14.4 % (ref 11.5–15.5)
WBC: 12.6 10*3/uL — ABNORMAL HIGH (ref 4.0–10.5)
nRBC: 0 % (ref 0.0–0.2)

## 2021-09-13 LAB — IRON AND TIBC
Iron: 75 ug/dL (ref 28–170)
Saturation Ratios: 26 % (ref 10.4–31.8)
TIBC: 284 ug/dL (ref 250–450)
UIBC: 209 ug/dL

## 2021-09-13 LAB — FERRITIN: Ferritin: 279 ng/mL (ref 11–307)

## 2021-09-13 NOTE — Progress Notes (Signed)
Hematology/Oncology Consult note [facility]  Telephone:([phone] [phone]  Patient Care Team: [name], MD as PCP - General (Family Medicine) Minna Merritts, MD as PCP - Cardiology (Cardiology) Diona Browner Paulino Door, MD as Referring Physician (Orthopedic Surgery) Minna Merritts, MD as Consulting Physician (Cardiology) Sindy Guadeloupe, MD as Consulting Physician (Oncology)   Name of the patient: [name] Cooper  [id number]  1941-03-17   Date of visit: 09/13/21  Diagnosis- normocytic anemia likely secondary to chronic kidney disease    Chief complaint/ Reason for visit-routine follow-up of anemia of chronic kidney disease  Heme/Onc history:  Patient is a 81 year old female who was seen by Dr. Sherrine Maples in the past for evaluation of anemia. Her hemoglobin has been ranging between 9.9-10.8 over the last couple of years. She is also noted to have chronic kidney disease with a creatinine ranging from 1.5-2. 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   Review of blood work from October 2017 was as follows:  methylmalonic acid was elevated at 778. TIBC was low and ferritin was elevated at 399. spep showed polyclonal gammopathy. Cbc showed white count of 9.2, H&H of 10.1/30.6 and a platelet count of 297. Reticulocyte count was low at 0.8 in appropriate for the degree of anemia. Normal immunofixation pattern was noted in urine   Patient was started on monthly B12 injections given elevated MMA   Results of blood work from 02/11/2020 were as follows: CBC showed white count of 12.6, H&H of 11/35.2 with an MCV of 86 and a platelet count of 279.  Reticulocyte count was mildly low for the degree of anemia at 0.8.  Haptoglobin elevated TSH was normal.  Myeloma panel showed polyclonal increase in immunoglobulins.  Serum free light chain ratio was mildly abnormal at 1.9.  TSH was  normal at 2.1.  ESR was elevated at 80.  CMP showed creatinine of 1.6.  ANA comprehensive panel showed mildly elevated ENA IgG antibody of 1.7.  Patient presently on Retacrit  Interval history-overall patient is doing well other than mild fatigue she denies other complaints at this time.  No recent hospitalizations.  ECOG PS- 1 Pain scale- 0   Review of systems- Review of Systems  Constitutional:  Positive for malaise/fatigue. Negative for chills, fever and weight loss.  HENT:  Negative for congestion, ear discharge and nosebleeds.   Eyes:  Negative for blurred vision.  Respiratory:  Negative for cough, hemoptysis, sputum production, shortness of breath and wheezing.   Cardiovascular:  Negative for chest pain, palpitations, orthopnea and claudication.  Gastrointestinal:  Negative for abdominal pain, blood in stool, constipation, diarrhea, heartburn, melena, nausea and vomiting.  Genitourinary:  Negative for dysuria, flank pain, frequency, hematuria and urgency.  Musculoskeletal:  Negative for back pain, joint pain and myalgias.  Skin:  Negative for rash.  Neurological:  Negative for dizziness, tingling, focal weakness, seizures, weakness and headaches.  Endo/Heme/Allergies:  Does not bruise/bleed easily.  Psychiatric/Behavioral:  Negative for depression and suicidal ideas. The patient does not have insomnia.       Allergies  Allergen Reactions   Sulfa Antibiotics Anaphylaxis     Past Medical History:  Diagnosis Date   Anemia    Arthritis    left hip, left ankle   Cholelithiases    CKD (chronic kidney disease), stage II    Coronary artery disease    a. cardiac cath 01/2011: mid  LAD stent patent, mild lumenal irreg's, nl EF 55%, no AS/MR   Fibromyalgia    Frozen shoulder    History of kidney stones    HTN (hypertension)    controlled with medication;    Hyperlipidemia    a. statin intolerant   Iron deficiency    Thrombocytopenia ([facility])    Ulcer of left lower extremity,  limited to breakdown of skin ([facility])    UTI (urinary tract infection)    Venous stasis      Past Surgical History:  Procedure Laterality Date   CARDIAC CATHETERIZATION     stents placed in mid LAD   CERVICAL SPINE SURGERY     FETAL BLOOD TRANSFUSION  2014   Frozen Shoulder     KIDNEY STONE SURGERY     LEG SURGERY     broke her leg    LOWER EXTREMITY ANGIOGRAPHY Right 02/17/2019   Procedure: LOWER EXTREMITY ANGIOGRAPHY;  Surgeon: Katha Cabal, MD;  Location: [place];  Service: Cardiovascular;  Laterality: Right;   LOWER EXTREMITY ANGIOGRAPHY Left 03/18/2019   Procedure: LOWER EXTREMITY ANGIOGRAPHY;  Surgeon: Katha Cabal, MD;  Location: [facility];  Service: Cardiovascular;  Laterality: Left;    Social History   Socioeconomic History   Marital status: Widowed    Spouse name: Not on file   Number of children: 3   Years of education: Not on file   Highest education level: Bachelor's degree (e.g., BA, AB, BS)  Occupational History   Not on file  Tobacco Use   Smoking status: Never   Smokeless tobacco: Never  Vaping Use   Vaping Use: Never used  Substance and Sexual Activity   Alcohol use: No   Drug use: No   Sexual activity: Not Currently    Birth control/protection: Post-menopausal  Other Topics Concern   Not on file  Social History Narrative   Not on file   Social Determinants of Health   Financial Resource Strain: Not on file  Food Insecurity: Not on file  Transportation Needs: Not on file  Physical Activity: Not on file  Stress: Not on file  Social Connections: Not on file  Intimate Partner Violence: Not on file    Family History  Problem Relation Age of Onset   Hypertension Mother    Heart attack Mother    Heart attack Father    Heart disease Brother        h/o CABG   Diabetes Brother    Kidney disease Brother    Kidney cancer Neg Hx    Bladder Cancer Neg Hx    Breast cancer Neg Hx      Current Outpatient  Medications:    losartan-hydrochlorothiazide (HYZAAR) 100-25 MG tablet, TAKE 1 TABLET BY MOUTH DAILY, Disp: 90 tablet, Rfl: 0   atorvastatin (LIPITOR) 20 MG tablet, TAKE 1 TABLET(20 MG) BY MOUTH DAILY (Patient not taking: Reported on 09/13/2021), Disp: 90 tablet, Rfl: 3   clopidogrel (PLAVIX) 75 MG tablet, Take 1 tablet (75 mg total) by mouth daily. (Patient not taking: Reported on 09/13/2021), Disp: 90 tablet, Rfl: 4   donepezil (ARICEPT) 5 MG tablet, Take 5 mg by mouth daily. (Patient not taking: Reported on 09/13/2021), Disp: , Rfl:    estradiol (ESTRACE) 0.1 MG/GM vaginal cream, APPLY A PEA SIZE AMOUNT MONDAY, WEDNESDAY, AND FRIDAY NIGHT (Patient not taking: Reported on 06/21/2021), Disp: 42.5 g, Rfl: 12   nitroGLYCERIN (NITROSTAT) 0.4 MG SL tablet, Place 1 tablet (0.4 mg total)  under the tongue every 5 (five) minutes as needed for chest pain. (Patient not taking: Reported on 09/13/2021), Disp: 25 tablet, Rfl: 3   temazepam (RESTORIL) 30 MG capsule, Take 30 mg by mouth at bedtime.  (Patient not taking: Reported on 09/13/2021), Disp: , Rfl:    Vibegron (GEMTESA) 75 MG TABS, Take 75 mg by mouth daily. (Patient not taking: Reported on 06/21/2021), Disp: 90 tablet, Rfl: 3 No current facility-administered medications for this visit.  Facility-Administered Medications Ordered in Other Visits:    epoetin alfa-epbx (RETACRIT) injection 40,000 Units, 40,000 Units, Subcutaneous, Q90 days, Sindy Guadeloupe, MD, 40,000 Units at 07/25/20 1113   epoetin alfa-epbx (RETACRIT) injection 40,000 Units, 40,000 Units, Subcutaneous, Once, Sindy Guadeloupe, MD  Physical exam:  Physical Exam Constitutional:      General: She is not in acute distress. Cardiovascular:     Rate and Rhythm: Normal rate and regular rhythm.     Heart sounds: Normal heart sounds.  Pulmonary:     Effort: Pulmonary effort is normal.     Breath sounds: Normal breath sounds.  Abdominal:     General: Bowel sounds are normal.     Palpations: Abdomen  is soft.  Skin:    General: Skin is warm and dry.  Neurological:     Mental Status: She is alert and oriented to person, place, and time.         Latest Ref Rng & Units 10/13/2020   10:20 AM  CMP  Creatinine 0.44 - 1.00 mg/dL 1.50       Latest Ref Rng & Units 09/13/2021    8:38 AM  CBC  WBC 4.0 - 10.5 K/uL 12.6   Hemoglobin 12.0 - 15.0 g/dL 11.2   Hematocrit 36.0 - 46.0 % 35.2   Platelets 150 - 400 K/uL 213      Assessment and plan- Patient is a 81 y.o. female with anemia of chronic kidney disease here for routine follow-up  Patient last received Retacrit on 08/02/2021.  Today her hemoglobin is 11.2 and she does not require Retacrit.  I will repeat CBC ferritin and iron studies in 3 in 6 months for possible Retacrit and see her back in 6 months.  Iron studies from today are unremarkable   Visit Diagnosis 1. Anemia due to chronic kidney disease, unspecified CKD stage   2. Erythropoietin (EPO) stimulating agent anemia management patient      Dr. Randa Evens, MD, MPH [facility] [phone] 09/13/2021 1:37 PM

## 2021-09-13 NOTE — Progress Notes (Signed)
No concerns. 

## 2021-09-30 ENCOUNTER — Other Ambulatory Visit: Payer: Self-pay | Admitting: Cardiovascular Disease

## 2021-12-04 NOTE — Progress Notes (Deleted)
Cardiology Office Note  Date:  12/04/2021   ID:  [name] Cooper, DOB [birth year], MRN [id number]  PCP:  [name], MD   No chief complaint on file.   HPI:  [name] Cooper is a very pleasant 81 year old woman with history of  coronary artery disease,  stent placed to her mid LAD with repeat stenting for in-stent restenosis >10 years ago,  hyperlipidemia,  hypertension,  fibromyalgia and diffuse muscle aches and arthritis  who presents for Followup of her coronary artery disease. Moderate bilateral carotid arterial disease   LOV June 2022   blister when she hit her leg on the side of the bed. Patient has had worsening erythema, pain, swelling, and drainage from the ulcer on her left shin. cellulitis, in [facility] hospital 11 days, hospital records reviewed Angiogram left leg without intervention. Wound debrided and wound vac applied. seen by PCP multiple times for this ulcer, and has completed a course of keflex with some improvement.  Barium swallow showing esophageal dysmotility. EGD showing esophageal candidiasis-prescribed fluconazole for 21 days. hiatal hernia.  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 Lorretta Harp 01/2019 left lower extremity for limb salvage 02/2019 left leg angioplasty and stent placement the SFA  Carotid ultrasound reviewed with her Right Carotid: Velocities in the right ICA are consistent with a 1-39%  stenosis. The ECA appears  >50% stenosed.  Left Carotid: Velocities in the left ICA are consistent with a 40-59%  stenosis.  ------She has bilateral 40-59% carotid arterial disease, bruit on the right Last documented 2015  Discussed high cholesterol, Previously Did not want a statin "Had myalgias on crestor and lipitor" Now is taking Lipitor with no symptoms, stopped her Zetia for some reason  Cardiac catheter at [facility] for chest discomfort earlier in the year showed patent LAD stent with no other significant stenoses.     PMH:   has a past medical history of Anemia, Arthritis, Cholelithiases, CKD (chronic kidney disease), stage II, Coronary artery disease, Fibromyalgia, Frozen shoulder, History of kidney stones, HTN (hypertension), Hyperlipidemia, Iron deficiency, Thrombocytopenia (HCC), Ulcer of left lower extremity, limited to breakdown of skin (HCC), UTI (urinary tract infection), and Venous stasis.  PSH:    Past Surgical History:  Procedure Laterality Date   CARDIAC CATHETERIZATION     stents placed in mid LAD   CERVICAL SPINE SURGERY     FETAL BLOOD TRANSFUSION  2014   Frozen Shoulder     KIDNEY STONE SURGERY     LEG SURGERY     broke her leg    LOWER EXTREMITY ANGIOGRAPHY Right 02/17/2019   Procedure: LOWER EXTREMITY ANGIOGRAPHY;  Surgeon: Renford Dills, MD;  Location: [facility] INVASIVE CV LAB;  Service: Cardiovascular;  Laterality: Right;   LOWER EXTREMITY ANGIOGRAPHY Left 03/18/2019   Procedure: LOWER EXTREMITY ANGIOGRAPHY;  Surgeon: Renford Dills, MD;  Location: [facility] INVASIVE CV LAB;  Service: Cardiovascular;  Laterality: Left;    Current Outpatient Medications  Medication Sig Dispense Refill  atorvastatin (LIPITOR) 20 MG tablet TAKE 1 TABLET(20 MG) BY MOUTH DAILY (Patient not taking: Reported on 09/13/2021) 90 tablet 3   clopidogrel (PLAVIX) 75 MG tablet Take 1 tablet (75 mg total) by mouth daily. (Patient not taking: Reported on 09/13/2021) 90 tablet 4   donepezil (ARICEPT) 5 MG tablet Take  5 mg by mouth daily. (Patient not taking: Reported on 09/13/2021)     estradiol (ESTRACE) 0.1 MG/GM vaginal cream APPLY A PEA SIZE AMOUNT MONDAY, WEDNESDAY, AND FRIDAY NIGHT (Patient not taking: Reported on 06/21/2021) 42.5 g 12   losartan-hydrochlorothiazide (HYZAAR) 100-25 MG tablet TAKE 1 TABLET BY MOUTH DAILY 90 tablet 0   nitroGLYCERIN (NITROSTAT) 0.4 MG SL tablet Place 1 tablet (0.4 mg total) under the tongue every 5 (five) minutes as needed for chest pain. (Patient not taking: Reported on 09/13/2021) 25 tablet 3   temazepam (RESTORIL) 30 MG capsule Take 30 mg by mouth at bedtime.  (Patient not taking: Reported on 09/13/2021)     Vibegron (GEMTESA) 75 MG TABS Take 75 mg by mouth daily. (Patient not taking: Reported on 06/21/2021) 90 tablet 3   No current facility-administered medications for this visit.   Facility-Administered Medications Ordered in Other Visits  Medication Dose Route Frequency Provider Last Rate Last Admin   epoetin alfa-epbx (RETACRIT) injection 40,000 Units  40,000 Units Subcutaneous Q90 days Creig Hines, MD   40,000 Units at 07/25/20 1113   epoetin alfa-epbx (RETACRIT) injection 40,000 Units  40,000 Units Subcutaneous Once Creig Hines, MD        Allergies:   Sulfa antibiotics   Social History:  The patient  reports that she has never smoked. She has never used smokeless tobacco. She reports that she does not drink alcohol and does not use drugs.   Family History:   family history includes Diabetes in her brother; Heart attack in her father and mother; Heart disease in her brother; Hypertension in her mother; Kidney disease in her brother.    Review of Systems: Review of Systems  Constitutional: Negative.   HENT: Negative.    Respiratory: Negative.    Cardiovascular: Negative.   Gastrointestinal: Negative.   Musculoskeletal: Negative.   Neurological: Negative.   Psychiatric/Behavioral: Negative.    All other systems reviewed and are negative.   PHYSICAL  EXAM: VS:  There were no vitals taken for this visit. , BMI There is no height or weight on file to calculate BMI. Constitutional:  oriented to person, place, and time. No distress.  HENT:  Head: Grossly normal Eyes:  no discharge. No scleral icterus.  Neck: No JVD, no carotid bruits  Cardiovascular: Regular rate and rhythm, no murmurs appreciated Pulmonary/Chest: Clear to auscultation bilaterally, no wheezes or rails Abdominal: Soft.  no distension.  no tenderness.  Musculoskeletal: Normal range of motion Neurological:  normal muscle tone. Coordination normal. No atrophy Skin: Skin warm and dry Psychiatric: normal affect, pleasant   Recent Labs: 09/13/2021: Hemoglobin 11.2; Platelets 213    Lipid Panel Lab Results  Component Value Date   CHOL 201 (H) 02/01/2020   HDL 73 02/01/2020   LDLCALC 111 (H) 02/01/2020   TRIG 97 02/01/2020      Wt Readings from Last 3 Encounters:  06/21/21 196 lb 9.6 oz (89.2 kg)  05/19/21 190 lb 12.8 oz (86.5 kg)  04/18/21 185 lb (83.9 kg)     ASSESSMENT AND PLAN:  Coronary artery disease of native artery of native heart with stable angina pectoris (HCC) - Plan: EKG 12-Lead  Currently with no symptoms of angina. No further workup at this time. Continue current medication regimen.  Anemia Stable but mildly depressed Followed by hematology oncology  Bilateral carotid artery stenosis Moderate disease,  Tolerating Lipitor and zetia Goal LDL less than 70, levels primary care  Essential hypertension, benign Blood pressure is well controlled on today's visit. No changes made to the medications.  Mixed hyperlipidemia Continue atorvastatin and zetia Goal LDL less than 70  PAD  followed by vascular On Plavix, statin  Leg swelling Venous insufficiency, Recommend compression hose   Total encounter time more than 25 minutes  Greater than 50% was spent in counseling and coordination of care with the patient    No orders of the defined  types were placed in this encounter.    Signed, Esmond Plants, M.D., Ph.D. 12/04/2021  [place], [place]

## 2021-12-05 ENCOUNTER — Encounter: Payer: Self-pay | Admitting: Cardiovascular Disease

## 2021-12-05 ENCOUNTER — Ambulatory Visit: Payer: Medicare Other | Attending: Cardiovascular Disease | Admitting: Cardiovascular Disease

## 2021-12-05 DIAGNOSIS — E785 Hyperlipidemia, unspecified: Secondary | ICD-10-CM

## 2021-12-05 DIAGNOSIS — I25118 Atherosclerotic heart disease of native coronary artery with other forms of angina pectoris: Secondary | ICD-10-CM

## 2021-12-05 DIAGNOSIS — I10 Essential (primary) hypertension: Secondary | ICD-10-CM

## 2021-12-05 DIAGNOSIS — I739 Peripheral vascular disease, unspecified: Secondary | ICD-10-CM

## 2021-12-05 DIAGNOSIS — I6523 Occlusion and stenosis of bilateral carotid arteries: Secondary | ICD-10-CM

## 2021-12-14 ENCOUNTER — Inpatient Hospital Stay: Payer: Medicare Other | Attending: Family Medicine

## 2021-12-14 ENCOUNTER — Inpatient Hospital Stay: Payer: Medicare Other

## 2022-01-16 ENCOUNTER — Other Ambulatory Visit: Payer: Self-pay | Admitting: *Deleted

## 2022-01-22 ENCOUNTER — Encounter (INDEPENDENT_AMBULATORY_CARE_PROVIDER_SITE_OTHER): Payer: Self-pay

## 2022-01-29 ENCOUNTER — Other Ambulatory Visit: Payer: Self-pay

## 2022-01-29 MED ORDER — ATORVASTATIN CALCIUM 20 MG PO TABS: ORAL_TABLET | ORAL | 0 refills | Status: AC

## 2022-03-20 ENCOUNTER — Other Ambulatory Visit: Payer: Self-pay

## 2022-03-20 MED ORDER — LOSARTAN POTASSIUM-HCTZ 100-25 MG PO TABS: 1.0000 | ORAL_TABLET | Freq: Every day | ORAL | 0 refills | Status: AC

## 2022-03-21 ENCOUNTER — Telehealth: Payer: Self-pay | Admitting: Cardiovascular Disease

## 2022-03-21 NOTE — Telephone Encounter (Signed)
LVM to schedule appt

## 2022-03-21 NOTE — Telephone Encounter (Signed)
-----   Message from Ihor Dow, CMA sent at 03/20/2022  1:18 PM EST ----- Please contact patient for a past due follow up with Dr. Mariah Milling. The patient is requesting refills.  Thanks, Jasmine December

## 2022-03-23 ENCOUNTER — Inpatient Hospital Stay: Payer: Medicare Other | Admitting: Oncology

## 2022-03-23 ENCOUNTER — Inpatient Hospital Stay: Payer: Medicare Other

## 2022-06-29 NOTE — Telephone Encounter (Signed)
Left voice mail to schedule appt

## 2022-07-02 ENCOUNTER — Encounter: Payer: Self-pay | Admitting: Cardiovascular Disease

## 2022-07-02 NOTE — Telephone Encounter (Signed)
Unable to reach letter sent by mail.

## 2022-07-02 NOTE — Telephone Encounter (Signed)
Left voicemail to schedule appointment 

## 2023-01-06 NOTE — Progress Notes (Deleted)
Cardiology Office Note  Date:  01/06/2023   ID:  [name] Cooper, DOB [birth year], MRN [id number]  PCP:  [name], MD   No chief complaint on file.   HPI:  [name] Cooper is a very pleasant 82 year old woman with history of  coronary artery disease,  stent placed to her mid LAD with repeat stenting for in-stent restenosis >10 years ago,  hyperlipidemia,  hypertension,  fibromyalgia and diffuse muscle aches and arthritis  who presents for Followup of her coronary artery disease. Moderate bilateral carotid arterial disease   LOV 6/22 Seen in the emergency room in [facility] September 2020 for leg swelling Left greater than right, weeping from left Lives at assisted living facility  It was recommended she have an outpatient DVT study done Started on Lasix 40 twice daily 7 days with potassium 20 twice daily    blister when she hit her leg on the side of the bed. Patient has had worsening erythema, pain, swelling, and drainage from the ulcer on her left shin. cellulitis, in [facility] hospital 11 days, hospital records reviewed Angiogram left leg without intervention. Wound debrided and wound vac applied. seen by PCP multiple times for this ulcer, and has completed a course of keflex with some improvement.  Barium swallow showing esophageal dysmotility. EGD showing esophageal candidiasis-prescribed fluconazole for 21 days. hiatal hernia.  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 Lorretta Harp 01/2019 left lower extremity for limb salvage 02/2019 left leg angioplasty and stent placement the [facility]  Carotid ultrasound reviewed with her Right Carotid: Velocities in the right ICA are consistent with a 1-39%  stenosis. The ECA appears >50% stenosed.  Left Carotid: Velocities in the left ICA are consistent with a 40-59%  stenosis.  ------She has bilateral 40-59% carotid arterial disease, bruit on the right Last documented 2015  Discussed high cholesterol, Previously Did not want a statin "Had myalgias on crestor and lipitor" Now is taking Lipitor with no symptoms, stopped her Zetia for some reason  Cardiac catheter at [facility] for chest discomfort earlier in the year showed patent LAD stent with no other significant stenoses.     PMH:   has a past medical history of Anemia, Arthritis, Cholelithiases, CKD (chronic kidney disease), stage II, Coronary artery disease, Fibromyalgia, Frozen shoulder, History of kidney stones, HTN (hypertension), Hyperlipidemia, Iron deficiency, Thrombocytopenia (HCC), Ulcer of left lower extremity, limited to breakdown of skin (HCC), UTI (urinary tract infection), and Venous stasis.  PSH:    Past Surgical History:  Procedure Laterality Date   CARDIAC CATHETERIZATION     stents placed in mid LAD   CERVICAL SPINE SURGERY     FETAL BLOOD TRANSFUSION  2014   Frozen Shoulder     KIDNEY STONE SURGERY     LEG SURGERY     broke her leg    LOWER EXTREMITY ANGIOGRAPHY Right 02/17/2019   Procedure: LOWER EXTREMITY ANGIOGRAPHY;  Surgeon: Renford Dills, MD;  Location: [facility] INVASIVE CV LAB;  Service: Cardiovascular;  Laterality: Right;   LOWER EXTREMITY ANGIOGRAPHY Left 03/18/2019   Procedure: LOWER EXTREMITY ANGIOGRAPHY;  Surgeon: Renford Dills, MD;  Location: [facility] INVASIVE CV LAB;  Service: Cardiovascular;  Laterality: Left;    Current Outpatient Medications  Medication Sig Dispense Refill   atorvastatin (LIPITOR) 20 MG  tablet TAKE 1 TABLET(20 MG) BY MOUTH DAILY. Please contact the office to schedule appointment for additional refills. 1st Attempt. 30 tablet 0   clopidogrel (PLAVIX) 75 MG tablet Take 1 tablet (75 mg total) by mouth daily. (Patient not taking: Reported on 09/13/2021) 90 tablet 4   donepezil (ARICEPT) 5 MG tablet Take 5 mg by mouth daily. (Patient not taking: Reported on 09/13/2021)     estradiol (ESTRACE) 0.1 MG/GM vaginal cream APPLY A PEA SIZE AMOUNT MONDAY, WEDNESDAY, AND FRIDAY NIGHT (Patient not taking: Reported on 06/21/2021) 42.5 g 12   losartan-hydrochlorothiazide (HYZAAR) 100-25 MG tablet Take 1 tablet by mouth daily. Patient will need a follow up before any further refills given. 30 tablet 0   nitroGLYCERIN (NITROSTAT) 0.4 MG SL tablet Place 1 tablet (0.4 mg total) under the tongue every 5 (five) minutes as needed for chest pain. (Patient not taking: Reported on 09/13/2021) 25 tablet 3   temazepam (RESTORIL) 30 MG capsule Take 30 mg by mouth at bedtime.  (Patient not taking: Reported on 09/13/2021)     Vibegron (GEMTESA) 75 MG TABS Take 75 mg by mouth daily. (Patient not taking: Reported on 06/21/2021) 90 tablet 3   No current facility-administered medications for this visit.   Facility-Administered Medications Ordered in Other Visits  Medication Dose Route Frequency Provider Last Rate Last Admin   epoetin alfa-epbx (RETACRIT) injection 40,000 Units  40,000 Units Subcutaneous Q90 days Creig Hines, MD   40,000 Units at 07/25/20 1113   epoetin alfa-epbx (RETACRIT) injection 40,000 Units  40,000 Units Subcutaneous Once Creig Hines, MD        Allergies:   Sulfa antibiotics   Social History:  The patient  reports that she has never smoked. She has never used smokeless tobacco. She reports that she does not drink alcohol and does not use drugs.   Family History:   family history includes Diabetes in her brother; Heart attack in her father and mother; Heart disease in her brother;  Hypertension in her mother; Kidney disease in her brother.    Review of Systems: Review of Systems  Constitutional: Negative.   HENT: Negative.    Respiratory: Negative.    Cardiovascular: Negative.   Gastrointestinal: Negative.   Musculoskeletal: Negative.   Neurological: Negative.   Psychiatric/Behavioral: Negative.    All other systems reviewed and are negative.   PHYSICAL EXAM: VS:  There were no vitals taken for this visit. , BMI There is no height or weight on file to calculate BMI. Constitutional:  oriented to person, place, and time. No distress.  HENT:  Head: Grossly normal Eyes:  no discharge. No scleral icterus.  Neck: No JVD, no carotid bruits  Cardiovascular: Regular rate and rhythm, no murmurs appreciated Pulmonary/Chest: Clear to auscultation bilaterally, no wheezes or rails Abdominal: Soft.  no distension.  no tenderness.  Musculoskeletal: Normal range of motion Neurological:  normal muscle tone. Coordination normal. No atrophy Skin: Skin warm and dry Psychiatric: normal affect, pleasant   Recent Labs: No results found for requested labs within last 365 days.    Lipid Panel Lab Results  Component Value Date   CHOL 201 (H) 02/01/2020   HDL 73  02/01/2020   LDLCALC 111 (H) 02/01/2020   TRIG 97 02/01/2020      Wt Readings from Last 3 Encounters:  06/21/21 196 lb 9.6 oz (89.2 kg)  05/19/21 190 lb 12.8 oz (86.5 kg)  04/18/21 185 lb (83.9 kg)     ASSESSMENT AND PLAN:  Coronary artery disease of native artery of native heart with stable angina pectoris (HCC) - Plan: EKG 12-Lead Currently with no symptoms of angina. No further workup at this time. Continue current medication regimen.  Anemia Stable but mildly depressed Followed by hematology oncology  Bilateral carotid artery stenosis Moderate disease,  Tolerating Lipitor and zetia Goal LDL less than 70, levels primary care  Essential hypertension, benign Blood pressure is well controlled on  today's visit. No changes made to the medications.  Mixed hyperlipidemia Continue atorvastatin and zetia Goal LDL less than 70  PAD  followed by vascular On Plavix, statin  Leg swelling Venous insufficiency, Recommend compression hose   Total encounter time more than 25 minutes  Greater than 50% was spent in counseling and coordination of care with the patient    No orders of the defined types were placed in this encounter.    Signed, Dossie Arbour, M.D., Ph.D. 01/06/2023  [facility] [place], [place] [phone]

## 2023-01-08 ENCOUNTER — Ambulatory Visit: Payer: Medicare Other | Attending: Cardiovascular Disease | Admitting: Cardiovascular Disease

## 2023-01-08 DIAGNOSIS — I70213 Atherosclerosis of native arteries of extremities with intermittent claudication, bilateral legs: Secondary | ICD-10-CM

## 2023-01-08 DIAGNOSIS — I10 Essential (primary) hypertension: Secondary | ICD-10-CM

## 2023-01-08 DIAGNOSIS — I25118 Atherosclerotic heart disease of native coronary artery with other forms of angina pectoris: Secondary | ICD-10-CM

## 2023-01-08 DIAGNOSIS — M7989 Other specified soft tissue disorders: Secondary | ICD-10-CM

## 2023-01-08 DIAGNOSIS — I6529 Occlusion and stenosis of unspecified carotid artery: Secondary | ICD-10-CM

## 2023-01-08 DIAGNOSIS — E782 Mixed hyperlipidemia: Secondary | ICD-10-CM

## 2023-02-26 ENCOUNTER — Ambulatory Visit: Payer: Medicare Other | Attending: Cardiovascular Disease | Admitting: Cardiovascular Disease

## 2023-02-26 NOTE — Progress Notes (Unsigned)
NO SHOW
